# Patient Record
Sex: Male | Born: 1945 | Race: White | Hispanic: No | Marital: Married | Smoking: Former smoker
Health system: Southern US, Community
[De-identification: ages and names within clinical notes are randomized; demographics above are authoritative.]

## PROBLEM LIST (undated history)

## (undated) DIAGNOSIS — Z87442 Personal history of urinary calculi: Secondary | ICD-10-CM

## (undated) DIAGNOSIS — K219 Gastro-esophageal reflux disease without esophagitis: Secondary | ICD-10-CM

## (undated) DIAGNOSIS — Z9889 Other specified postprocedural states: Secondary | ICD-10-CM

## (undated) DIAGNOSIS — I341 Nonrheumatic mitral (valve) prolapse: Secondary | ICD-10-CM

## (undated) DIAGNOSIS — K029 Dental caries, unspecified: Secondary | ICD-10-CM

## (undated) DIAGNOSIS — D6949 Other primary thrombocytopenia: Secondary | ICD-10-CM

## (undated) DIAGNOSIS — Z794 Long term (current) use of insulin: Secondary | ICD-10-CM

## (undated) DIAGNOSIS — I340 Nonrheumatic mitral (valve) insufficiency: Secondary | ICD-10-CM

## (undated) DIAGNOSIS — E785 Hyperlipidemia, unspecified: Secondary | ICD-10-CM

## (undated) DIAGNOSIS — D61818 Other pancytopenia: Secondary | ICD-10-CM

## (undated) DIAGNOSIS — N138 Other obstructive and reflux uropathy: Secondary | ICD-10-CM

## (undated) DIAGNOSIS — E11649 Type 2 diabetes mellitus with hypoglycemia without coma: Secondary | ICD-10-CM

## (undated) DIAGNOSIS — C9201 Acute myeloblastic leukemia, in remission: Secondary | ICD-10-CM

## (undated) DIAGNOSIS — I10 Essential (primary) hypertension: Secondary | ICD-10-CM

## (undated) DIAGNOSIS — N401 Enlarged prostate with lower urinary tract symptoms: Secondary | ICD-10-CM

## (undated) DIAGNOSIS — Z972 Presence of dental prosthetic device (complete) (partial): Secondary | ICD-10-CM

## (undated) HISTORY — DX: Essential (primary) hypertension: I10

## (undated) HISTORY — DX: Acute myeloblastic leukemia, in remission: C92.01

## (undated) HISTORY — DX: Other pancytopenia: D61.818

## (undated) HISTORY — PX: ROTATOR CUFF REPAIR: SHX139

## (undated) HISTORY — PX: TENNIS ELBOW RELEASE/NIRSCHEL PROCEDURE: SHX6651

## (undated) HISTORY — PX: CARDIAC CATHETERIZATION: SHX172

## (undated) HISTORY — DX: Hyperlipidemia, unspecified: E78.5

## (undated) HISTORY — PX: INGUINAL HERNIA REPAIR: SHX194

## (undated) HISTORY — PX: EXTRACORPOREAL SHOCK WAVE LITHOTRIPSY: SHX1557

---

## 2004-12-20 ENCOUNTER — Ambulatory Visit: Payer: Self-pay | Admitting: Family Medicine

## 2005-02-14 ENCOUNTER — Ambulatory Visit: Payer: Self-pay | Admitting: Family Medicine

## 2005-02-21 ENCOUNTER — Ambulatory Visit: Payer: Self-pay | Admitting: Family Medicine

## 2005-02-28 ENCOUNTER — Ambulatory Visit: Payer: Self-pay

## 2005-02-28 ENCOUNTER — Encounter: Payer: Self-pay | Admitting: Cardiology

## 2005-03-07 ENCOUNTER — Ambulatory Visit: Payer: Self-pay | Admitting: Family Medicine

## 2005-05-09 ENCOUNTER — Ambulatory Visit: Payer: Self-pay | Admitting: Cardiology

## 2005-05-15 ENCOUNTER — Ambulatory Visit: Payer: Self-pay | Admitting: Cardiology

## 2005-05-15 ENCOUNTER — Encounter: Payer: Self-pay | Admitting: Cardiology

## 2005-05-15 ENCOUNTER — Ambulatory Visit (HOSPITAL_COMMUNITY): Admission: RE | Admit: 2005-05-15 | Discharge: 2005-05-15 | Payer: Self-pay | Admitting: Cardiology

## 2005-05-23 ENCOUNTER — Ambulatory Visit: Payer: Self-pay | Admitting: Family Medicine

## 2005-11-07 ENCOUNTER — Ambulatory Visit (HOSPITAL_COMMUNITY): Admission: RE | Admit: 2005-11-07 | Discharge: 2005-11-07 | Payer: Self-pay | Admitting: Chiropractic Medicine

## 2006-03-08 ENCOUNTER — Ambulatory Visit: Payer: Self-pay | Admitting: Family Medicine

## 2006-03-20 ENCOUNTER — Ambulatory Visit: Payer: Self-pay | Admitting: Cardiology

## 2006-03-27 ENCOUNTER — Ambulatory Visit: Payer: Self-pay

## 2006-03-27 ENCOUNTER — Encounter: Payer: Self-pay | Admitting: Cardiology

## 2006-04-12 ENCOUNTER — Ambulatory Visit: Payer: Self-pay | Admitting: Family Medicine

## 2006-04-12 ENCOUNTER — Ambulatory Visit: Payer: Self-pay | Admitting: Cardiothoracic Surgery

## 2006-04-12 LAB — CONVERTED CEMR LAB
AST: 24 units/L (ref 0–37)
Alkaline Phosphatase: 51 units/L (ref 39–117)
BUN: 15 mg/dL (ref 6–23)
Basophils Absolute: 0 10*3/uL (ref 0.0–0.1)
Calcium: 9.5 mg/dL (ref 8.4–10.5)
Chloride: 98 meq/L (ref 96–112)
Eosinophils Absolute: 0.2 10*3/uL (ref 0.0–0.6)
Glucose, Bld: 139 mg/dL — ABNORMAL HIGH (ref 70–99)
HDL: 37.5 mg/dL — ABNORMAL LOW (ref >39.0)
Hemoglobin: 16.9 g/dL (ref 13.0–17.0)
MCHC: 35.3 g/dL (ref 30.0–36.0)
MCV: 90.5 fL (ref 78.0–100.0)
Monocytes Absolute: 0.4 10*3/uL (ref 0.2–0.7)
Neutrophils Relative %: 58.8 % (ref 43.0–77.0)
PSA: 0.97 ng/mL (ref 0.10–4.00)
RDW: 13.3 % (ref 11.5–14.6)
Sodium: 139 meq/L (ref 135–145)
Total Bilirubin: 1.1 mg/dL (ref 0.3–1.2)
Total CHOL/HDL Ratio: 5.4
Total Protein: 6.9 g/dL (ref 6.0–8.3)
Triglycerides: 140 mg/dL (ref 0–149)

## 2006-04-19 ENCOUNTER — Ambulatory Visit: Payer: Self-pay | Admitting: Family Medicine

## 2006-06-19 ENCOUNTER — Ambulatory Visit: Payer: Self-pay | Admitting: Family Medicine

## 2006-07-03 ENCOUNTER — Ambulatory Visit: Payer: Self-pay | Admitting: Family Medicine

## 2006-07-03 LAB — CONVERTED CEMR LAB
Creatinine,U: 430 mg/dL
Hgb A1c MFr Bld: 6.4 % — ABNORMAL HIGH (ref 4.6–6.0)

## 2006-07-12 ENCOUNTER — Ambulatory Visit: Payer: Self-pay | Admitting: Cardiothoracic Surgery

## 2006-09-22 ENCOUNTER — Ambulatory Visit: Payer: Self-pay | Admitting: Family Medicine

## 2006-10-01 ENCOUNTER — Telehealth: Payer: Self-pay | Admitting: Internal Medicine

## 2006-12-17 ENCOUNTER — Encounter: Payer: Self-pay | Admitting: Cardiothoracic Surgery

## 2006-12-17 ENCOUNTER — Ambulatory Visit: Payer: Self-pay

## 2007-01-24 ENCOUNTER — Ambulatory Visit: Payer: Self-pay | Admitting: Cardiothoracic Surgery

## 2007-01-31 ENCOUNTER — Ambulatory Visit: Payer: Self-pay | Admitting: Cardiology

## 2007-04-04 ENCOUNTER — Telehealth: Payer: Self-pay | Admitting: Family Medicine

## 2007-05-31 ENCOUNTER — Ambulatory Visit: Payer: Self-pay | Admitting: Family Medicine

## 2007-05-31 DIAGNOSIS — Z794 Long term (current) use of insulin: Secondary | ICD-10-CM

## 2007-05-31 DIAGNOSIS — L821 Other seborrheic keratosis: Secondary | ICD-10-CM | POA: Insufficient documentation

## 2007-05-31 DIAGNOSIS — I10 Essential (primary) hypertension: Secondary | ICD-10-CM

## 2007-05-31 DIAGNOSIS — J309 Allergic rhinitis, unspecified: Secondary | ICD-10-CM | POA: Insufficient documentation

## 2007-05-31 DIAGNOSIS — E11649 Type 2 diabetes mellitus with hypoglycemia without coma: Secondary | ICD-10-CM

## 2007-05-31 DIAGNOSIS — M25519 Pain in unspecified shoulder: Secondary | ICD-10-CM | POA: Insufficient documentation

## 2007-05-31 DIAGNOSIS — E785 Hyperlipidemia, unspecified: Secondary | ICD-10-CM | POA: Insufficient documentation

## 2007-05-31 DIAGNOSIS — J45909 Unspecified asthma, uncomplicated: Secondary | ICD-10-CM | POA: Insufficient documentation

## 2007-06-24 ENCOUNTER — Ambulatory Visit: Payer: Self-pay | Admitting: Family Medicine

## 2007-06-24 LAB — CONVERTED CEMR LAB
ALT: 25 units/L (ref 0–53)
AST: 21 units/L (ref 0–37)
Albumin: 3.7 g/dL (ref 3.5–5.2)
Alkaline Phosphatase: 44 units/L (ref 39–117)
BUN: 14 mg/dL (ref 6–23)
Basophils Absolute: 0 10*3/uL (ref 0.0–0.1)
Blood in Urine, dipstick: NEGATIVE
Calcium: 9.2 mg/dL (ref 8.4–10.5)
Creatinine, Ser: 0.9 mg/dL (ref 0.4–1.5)
GFR calc non Af Amer: 91 mL/min
HCT: 46.7 % (ref 39.0–52.0)
Hgb A1c MFr Bld: 6.7 % — ABNORMAL HIGH (ref 4.6–6.0)
LDL Cholesterol: 65 mg/dL (ref 0–99)
Lymphocytes Relative: 27.5 % (ref 12.0–46.0)
MCHC: 34.3 g/dL (ref 30.0–36.0)
MCV: 92.9 fL (ref 78.0–100.0)
Microalb, Ur: 2.7 mg/dL — ABNORMAL HIGH (ref 0.0–1.9)
Monocytes Relative: 7.9 % (ref 3.0–12.0)
Neutrophils Relative %: 63.3 % (ref 43.0–77.0)
Nitrite: NEGATIVE
RBC: 5.03 M/uL (ref 4.22–5.81)
RDW: 14 % (ref 11.5–14.6)
Specific Gravity, Urine: >=1.03
Total CHOL/HDL Ratio: 3.6
Total Protein: 6.6 g/dL (ref 6.0–8.3)
Urobilinogen, UA: 1
WBC Urine, dipstick: NEGATIVE
WBC: 5.4 10*3/uL (ref 4.5–10.5)

## 2007-07-15 ENCOUNTER — Ambulatory Visit: Payer: Self-pay

## 2007-07-15 ENCOUNTER — Encounter: Payer: Self-pay | Admitting: Cardiology

## 2007-07-16 ENCOUNTER — Ambulatory Visit: Payer: Self-pay | Admitting: Family Medicine

## 2007-07-16 DIAGNOSIS — I080 Rheumatic disorders of both mitral and aortic valves: Secondary | ICD-10-CM | POA: Insufficient documentation

## 2007-07-25 ENCOUNTER — Ambulatory Visit: Payer: Self-pay | Admitting: Cardiology

## 2007-09-27 ENCOUNTER — Ambulatory Visit: Payer: Self-pay | Admitting: Internal Medicine

## 2007-10-11 ENCOUNTER — Encounter: Payer: Self-pay | Admitting: Internal Medicine

## 2007-10-11 ENCOUNTER — Ambulatory Visit: Payer: Self-pay | Admitting: Internal Medicine

## 2007-10-16 ENCOUNTER — Encounter: Payer: Self-pay | Admitting: Internal Medicine

## 2007-12-16 ENCOUNTER — Ambulatory Visit: Payer: Self-pay | Admitting: Family Medicine

## 2007-12-16 DIAGNOSIS — J13 Pneumonia due to Streptococcus pneumoniae: Secondary | ICD-10-CM | POA: Insufficient documentation

## 2007-12-17 ENCOUNTER — Telehealth: Payer: Self-pay | Admitting: Family Medicine

## 2008-01-20 ENCOUNTER — Ambulatory Visit: Payer: Self-pay | Admitting: Family Medicine

## 2008-01-20 LAB — CONVERTED CEMR LAB
BUN: 20 mg/dL (ref 6–23)
CO2: 33 meq/L — ABNORMAL HIGH (ref 19–32)
Calcium: 9.8 mg/dL (ref 8.4–10.5)
Chloride: 102 meq/L (ref 96–112)
Creatinine, Ser: 1.2 mg/dL (ref 0.4–1.5)
GFR calc non Af Amer: 65 mL/min
Glucose, Bld: 158 mg/dL — ABNORMAL HIGH (ref 70–99)
Potassium: 3.7 meq/L (ref 3.5–5.1)

## 2008-01-27 ENCOUNTER — Ambulatory Visit: Payer: Self-pay | Admitting: Cardiology

## 2008-01-27 ENCOUNTER — Ambulatory Visit: Payer: Self-pay

## 2008-01-27 ENCOUNTER — Encounter: Payer: Self-pay | Admitting: Cardiology

## 2008-01-28 ENCOUNTER — Ambulatory Visit: Payer: Self-pay | Admitting: Family Medicine

## 2008-03-16 DIAGNOSIS — S0083XA Contusion of other part of head, initial encounter: Secondary | ICD-10-CM

## 2008-03-16 DIAGNOSIS — S0003XA Contusion of scalp, initial encounter: Secondary | ICD-10-CM | POA: Insufficient documentation

## 2008-03-16 DIAGNOSIS — S1093XA Contusion of unspecified part of neck, initial encounter: Secondary | ICD-10-CM

## 2008-03-18 DIAGNOSIS — H8110 Benign paroxysmal vertigo, unspecified ear: Secondary | ICD-10-CM

## 2008-03-23 ENCOUNTER — Ambulatory Visit: Payer: Self-pay | Admitting: Family Medicine

## 2008-04-09 ENCOUNTER — Telehealth: Payer: Self-pay | Admitting: Family Medicine

## 2008-04-16 ENCOUNTER — Telehealth: Payer: Self-pay | Admitting: Internal Medicine

## 2008-07-22 ENCOUNTER — Ambulatory Visit: Payer: Self-pay | Admitting: Family Medicine

## 2008-07-22 LAB — CONVERTED CEMR LAB
ALT: 31 units/L (ref 0–53)
AST: 30 units/L (ref 0–37)
Albumin: 4 g/dL (ref 3.5–5.2)
Alkaline Phosphatase: 52 units/L (ref 39–117)
CO2: 33 meq/L — ABNORMAL HIGH (ref 19–32)
Chloride: 109 meq/L (ref 96–112)
Eosinophils Absolute: 0 10*3/uL (ref 0.0–0.7)
Eosinophils Relative: 0.6 % (ref 0.0–5.0)
Glucose, Bld: 159 mg/dL — ABNORMAL HIGH (ref 70–99)
HCT: 46 % (ref 39.0–52.0)
Hemoglobin: 16 g/dL (ref 13.0–17.0)
Lymphs Abs: 1.3 10*3/uL (ref 0.7–4.0)
MCHC: 34.7 g/dL (ref 30.0–36.0)
MCV: 91.7 fL (ref 78.0–100.0)
Microalb, Ur: 2.5 mg/dL — ABNORMAL HIGH (ref 0.0–1.9)
Monocytes Absolute: 0.3 10*3/uL (ref 0.1–1.0)
Monocytes Relative: 6.1 % (ref 3.0–12.0)
PSA: 1.04 ng/mL (ref 0.10–4.00)
RBC: 5.02 M/uL (ref 4.22–5.81)
Total Protein: 7 g/dL (ref 6.0–8.3)
Triglycerides: 96 mg/dL (ref 0.0–149.0)
VLDL: 19.2 mg/dL (ref 0.0–40.0)
WBC: 4.7 10*3/uL (ref 4.5–10.5)

## 2008-07-31 ENCOUNTER — Ambulatory Visit: Payer: Self-pay | Admitting: Family Medicine

## 2008-09-04 ENCOUNTER — Encounter: Payer: Self-pay | Admitting: *Deleted

## 2008-10-23 ENCOUNTER — Telehealth: Payer: Self-pay | Admitting: Family Medicine

## 2008-10-27 ENCOUNTER — Ambulatory Visit: Payer: Self-pay | Admitting: Family Medicine

## 2008-10-27 LAB — CONVERTED CEMR LAB
CO2: 31 meq/L (ref 19–32)
Calcium: 9.2 mg/dL (ref 8.4–10.5)
Hgb A1c MFr Bld: 6.7 % — ABNORMAL HIGH (ref 4.6–6.5)
Potassium: 3.1 meq/L — ABNORMAL LOW (ref 3.5–5.1)

## 2008-11-02 ENCOUNTER — Encounter (INDEPENDENT_AMBULATORY_CARE_PROVIDER_SITE_OTHER): Payer: Self-pay | Admitting: *Deleted

## 2008-11-02 ENCOUNTER — Ambulatory Visit: Payer: Self-pay | Admitting: Family Medicine

## 2008-11-05 ENCOUNTER — Telehealth: Payer: Self-pay | Admitting: Family Medicine

## 2008-11-29 ENCOUNTER — Encounter: Admission: RE | Admit: 2008-11-29 | Discharge: 2008-11-29 | Payer: Self-pay | Admitting: Orthopaedic Surgery

## 2008-12-15 ENCOUNTER — Encounter: Admission: RE | Admit: 2008-12-15 | Discharge: 2009-02-11 | Payer: Self-pay | Admitting: Orthopaedic Surgery

## 2009-01-13 ENCOUNTER — Ambulatory Visit (HOSPITAL_COMMUNITY): Admission: RE | Admit: 2009-01-13 | Discharge: 2009-01-13 | Payer: Self-pay | Admitting: Cardiology

## 2009-01-13 ENCOUNTER — Ambulatory Visit: Payer: Self-pay | Admitting: Cardiology

## 2009-01-13 ENCOUNTER — Ambulatory Visit: Payer: Self-pay

## 2009-01-13 ENCOUNTER — Encounter: Payer: Self-pay | Admitting: Cardiology

## 2009-01-19 ENCOUNTER — Ambulatory Visit: Payer: Self-pay | Admitting: Cardiology

## 2009-01-19 DIAGNOSIS — E663 Overweight: Secondary | ICD-10-CM

## 2009-01-21 ENCOUNTER — Ambulatory Visit: Payer: Self-pay | Admitting: Family Medicine

## 2009-01-21 LAB — CONVERTED CEMR LAB
BUN: 19 mg/dL (ref 6–23)
CO2: 30 meq/L (ref 19–32)
Chloride: 101 meq/L (ref 96–112)
Sodium: 143 meq/L (ref 135–145)

## 2009-01-29 ENCOUNTER — Ambulatory Visit: Payer: Self-pay | Admitting: Family Medicine

## 2009-02-11 ENCOUNTER — Encounter: Admission: RE | Admit: 2009-02-11 | Discharge: 2009-02-25 | Payer: Self-pay | Admitting: Orthopaedic Surgery

## 2009-02-18 ENCOUNTER — Encounter: Admission: RE | Admit: 2009-02-18 | Discharge: 2009-05-12 | Payer: Self-pay | Admitting: Orthopaedic Surgery

## 2009-05-25 ENCOUNTER — Telehealth: Payer: Self-pay | Admitting: Family Medicine

## 2009-05-25 ENCOUNTER — Telehealth: Payer: Self-pay | Admitting: Cardiology

## 2009-07-01 ENCOUNTER — Encounter: Admission: RE | Admit: 2009-07-01 | Discharge: 2009-07-01 | Payer: Self-pay | Admitting: Orthopaedic Surgery

## 2009-07-02 ENCOUNTER — Telehealth: Payer: Self-pay | Admitting: Family Medicine

## 2009-07-14 ENCOUNTER — Telehealth: Payer: Self-pay | Admitting: Family Medicine

## 2009-07-19 ENCOUNTER — Encounter: Admission: RE | Admit: 2009-07-19 | Discharge: 2009-10-17 | Payer: Self-pay | Admitting: Orthopaedic Surgery

## 2009-07-27 ENCOUNTER — Ambulatory Visit: Payer: Self-pay | Admitting: Family Medicine

## 2009-07-27 LAB — CONVERTED CEMR LAB
AST: 24 units/L (ref 0–37)
Albumin: 4 g/dL (ref 3.5–5.2)
BUN: 18 mg/dL (ref 6–23)
Bilirubin, Direct: 0.2 mg/dL (ref 0.0–0.3)
Blood in Urine, dipstick: NEGATIVE
Chloride: 101 meq/L (ref 96–112)
Eosinophils Absolute: 0 10*3/uL (ref 0.0–0.7)
GFR calc non Af Amer: 75.96 mL/min (ref >60)
Glucose, Bld: 143 mg/dL — ABNORMAL HIGH (ref 70–99)
HDL: 41.3 mg/dL (ref >39.00)
Hemoglobin: 15.7 g/dL (ref 13.0–17.0)
Hgb A1c MFr Bld: 6.9 % — ABNORMAL HIGH (ref 4.6–6.5)
LDL Cholesterol: 72 mg/dL (ref 0–99)
Lymphs Abs: 1.5 10*3/uL (ref 0.7–4.0)
Microalb Creat Ratio: 0.8 mg/g (ref 0.0–30.0)
Microalb, Ur: 3.5 mg/dL — ABNORMAL HIGH (ref 0.0–1.9)
Monocytes Absolute: 0.4 10*3/uL (ref 0.1–1.0)
Nitrite: NEGATIVE
Platelets: 230 10*3/uL (ref 150.0–400.0)
Potassium: 3.6 meq/L (ref 3.5–5.1)
Total Bilirubin: 0.8 mg/dL (ref 0.3–1.2)
Total CHOL/HDL Ratio: 3
Urobilinogen, UA: 1
WBC: 5.7 10*3/uL (ref 4.5–10.5)

## 2009-08-03 ENCOUNTER — Ambulatory Visit: Payer: Self-pay | Admitting: Family Medicine

## 2009-10-20 ENCOUNTER — Encounter: Admission: RE | Admit: 2009-10-20 | Discharge: 2009-11-05 | Payer: Self-pay | Admitting: Orthopaedic Surgery

## 2009-11-25 ENCOUNTER — Telehealth: Payer: Self-pay | Admitting: Cardiology

## 2010-01-20 ENCOUNTER — Encounter: Payer: Self-pay | Admitting: Cardiology

## 2010-01-20 ENCOUNTER — Ambulatory Visit: Payer: Self-pay

## 2010-01-20 ENCOUNTER — Ambulatory Visit: Payer: Self-pay | Admitting: Cardiology

## 2010-01-20 ENCOUNTER — Ambulatory Visit (HOSPITAL_COMMUNITY)
Admission: RE | Admit: 2010-01-20 | Discharge: 2010-01-20 | Payer: Self-pay | Source: Home / Self Care | Attending: Cardiology | Admitting: Cardiology

## 2010-01-24 ENCOUNTER — Ambulatory Visit: Payer: Self-pay | Admitting: Family Medicine

## 2010-01-24 LAB — CONVERTED CEMR LAB
BUN: 20 mg/dL (ref 6–23)
CO2: 30 meq/L (ref 19–32)
Calcium: 9.6 mg/dL (ref 8.4–10.5)
GFR calc non Af Amer: 71.09 mL/min (ref >60.00)
Hgb A1c MFr Bld: 7 % — ABNORMAL HIGH (ref 4.6–6.5)
Sodium: 141 meq/L (ref 135–145)

## 2010-02-01 ENCOUNTER — Ambulatory Visit: Payer: Self-pay | Admitting: Family Medicine

## 2010-03-15 ENCOUNTER — Ambulatory Visit
Admission: RE | Admit: 2010-03-15 | Discharge: 2010-03-15 | Payer: Self-pay | Source: Home / Self Care | Attending: Family Medicine | Admitting: Family Medicine

## 2010-03-17 NOTE — Progress Notes (Signed)
Summary: letter of approval needs by 05/26/09  Tomorrow!!!! at 12:30  Phone Note Call from Patient Call back at [phone]   Caller: Patient Reason for Call: Talk to Nurse Action Taken: Rx Called In Summary of Call: needs letter of approval to complete in  [employer] for Metformin, needs letter before Thursday Initial call taken by: Migdalia Dk,  May 25, 2009 4:43 PM  Follow-up for Phone Call        Drug study - needs a letter saying that he has a heart murmur but it's ok for him to be in the study.  Pt states he needs the letter 05/26/2009 by 12:30 if possible.  Will forward to Dr Antoine Poche for his review. Follow-up by: Charolotte Capuchin, RN,  May 25, 2009 5:25 PM  Additional Follow-up for Phone Call Additional follow up Details #1::        Letter dictated Additional Follow-up by: Rollene Rotunda, MD, [facility],  May 25, 2009 6:08 PM    Additional Follow-up for Phone Call Additional follow up Details #2::    Spoke with Mr. Eddins and he will pick up letter today between 12:30 and 1 pm Follow-up by: Lisabeth Devoid RN,  May 26, 2009 9:29 AM

## 2010-03-17 NOTE — Miscellaneous (Signed)
  Clinical Lists Changes  Orders: Added new Referral order of Echocardiogram (Echo) - Signed 

## 2010-03-17 NOTE — Progress Notes (Signed)
Summary: REQ FOR REFILL (Zocor / Simvastatin)  Phone Note Refill Request   Refills Requested: Medication #1:  ZOCOR 20 MG  TABS Take 1 tab by mouth at bedtime; cpx when due or ov for med RF   Notes: [facility] [address].... 90-day supply.... Pt adv he has appt next month for cpx w/ Dr Tawanna Cooler.  sent already  Initial call taken by: Debbra Riding,  Jul 02, 2009 9:39 AM

## 2010-03-17 NOTE — Progress Notes (Signed)
Summary: need info  Phone Note Call from Patient Call back at [phone]   Caller: Patient---voicemail Summary of Call: What is his HBGA1c level ? Initial call taken by: Warnell Forester,  May 25, 2009 11:08 AM  Follow-up for Phone Call        Phone Call Completed Follow-up by: Kern Reap CMA Duncan Dull),  May 25, 2009 11:59 AM

## 2010-03-17 NOTE — Progress Notes (Signed)
Summary: lab concerns  Phone Note Call from Patient   Caller: Patient Call For: Roderick Pee MD Summary of Call: patient is calling because he had sugery last week.  he was given cortizone and oxycodone.  his labs are 07/27/09 for his physical.  he would like to know if he should still come in for his fasting lab.   Also FYI he stopped taking his glipezide. Initial call taken by: Kern Reap CMA Duncan Dull),  July 14, 2009 12:59 PM  Follow-up for Phone Call        yes Follow-up by: Roderick Pee MD,  July 15, 2009 8:59 AM  Additional Follow-up for Phone Call Additional follow up Details #1::        left message on machine for patient  Additional Follow-up by: Kern Reap CMA Duncan Dull),  July 15, 2009 11:51 AM

## 2010-03-17 NOTE — Assessment & Plan Note (Signed)
Summary: 6 month follow up/cjr   Vital Signs:  Patient profile:   65 year old male Weight:      204 pounds Temp:     98.7 degrees F oral BP sitting:   130 / 80  (left arm) Cuff size:   regular  Vitals Entered By: Kern Reap CMA Duncan Dull) (February 01, 2010 2:22 PM) CC: follow-up visit   Primary Care Ekin Pilar:  Dr.Todd  CC:  follow-up visit.  History of Present Illness: David Carr is a 65 year old male, who comes in today for follow-up of diabetes.  His fasting blood sugar is now up to 170 with a hemoglobin A1c of 7.0%.  He cannot tolerate anything more than 500 mg of metformin b.i.d. it gives him severe abdominal pain and diarrhea.  He also is intolerant of glipizide.  We talked about all the options I gave him 10 units of 7525.  Next subcutaneously as his first insulin injection  Allergies: 1)  ! Morphine/d5w  Past History:  Past medical, surgical, family and social histories (including risk factors) reviewed for relevance to current acute and chronic problems.  Past Medical History: Reviewed history from 01/20/2010 and no changes required. Allergic rhinitis Asthma Diabetes mellitus, type II Hyperlipidemia Hypertension Urinary tract outlet obstruction following surgery Mitral insufficiency  Past Surgical History: Reviewed history from 01/20/2010 and no changes required. Lft inguinal hernia repair Elbow surgery Rotator cuff surgery  Family History: Reviewed history from 07/16/2007 and no changes required. father died in his 46s and MI.  He had underlying alcoholism, and obesity mother lived to be late 62s died of old age  no brothersone sister has asthma  Social History: Reviewed history from 07/16/2007 and no changes required. Occupation: [employer] Married Never Smoked Alcohol use-no Drug use-no Regular exercise-yes  Review of Systems      See HPI       Flu Vaccine Consent Questions     Do you have a history of severe allergic reactions to this  vaccine? no    Any prior history of allergic reactions to egg and/or gelatin? no    Do you have a sensitivity to the preservative Thimersol? no    Do you have a past history of Guillan-Barre Syndrome? no    Do you currently have an acute febrile illness? no    Have you ever had a severe reaction to latex? no    Vaccine information given and explained to patient? yes    Are you currently pregnant? no    Lot Number:[id number]   Exp Date:08/13/2010   Site Given  Left Deltoid IM   Physical Exam  General:  Well-developed,well-nourished,in no acute distress; alert,appropriate and cooperative throughout examination   Problems:  Medical Problems Added: 1)  Dx of Diabetes Mellitus Type 1  (ICD-250.01)  Impression & Recommendations:  Problem # 1:  DIABETES MELLITUS TYPE 1 (ICD-250.01) Assessment New  His updated medication list for this problem includes:    Bayer Aspirin 325 Mg Tabs (Aspirin) ..... Once daily    Metformin Hcl 500 Mg Tabs (Metformin hcl) .Marland Kitchen... Take 1 tablet by mouth two times a day    Humalog Mix 75/25 75-25 % Susp (Insulin lispro prot & lispro) ..... Use 10 units at bedtime  Complete Medication List: 1)  Tenoretic 50 50-25 Mg Tabs (Atenolol-chlorthalidone) .... Take 1 tablet by mouth once a day ; cpx when due or ov for med rf 2)  Zocor 20 Mg Tabs (Simvastatin) .... Take 1 tab by mouth at bedtime;  cpx when due or ov for med rf 3)  Bayer Aspirin 325 Mg Tabs (Aspirin) .... Once daily 4)  Metformin Hcl 500 Mg Tabs (Metformin hcl) .... Take 1 tablet by mouth two times a day 5)  Humalog Mix 75/25 75-25 % Susp (Insulin lispro prot & lispro) .... Use 10 units at bedtime 6)  Bd Insulin Syringe Ultrafine 29g X 1/2" 0.5 Ml Misc (Insulin syringe-needle u-100) .... Use daily to inject insulin 7)  Freestyle Lite Test Strp (Glucose blood) .... Use once daily for glucose control 8)  Freestyle Unistick Ii Lancets Misc (Lancets) .... Use once daily for glucose control  Other  Orders: Admin 1st Vaccine (40981) Flu Vaccine 38yrs + (19147)  Patient Instructions: 1)  take 10 units of 75, 25 mix insulin nightly at bedtime starting tomorrow night. 2)  Check a fasting blood sugar daily in the morning. 3)  Return in 6 weeks for follow-up with a notebook of all your blood sugar readings. 4)  Continue the metformin 500 mg b.i.d. 5)  If however at you have episodes where you feel your blood sugar might be low.  Check a blood sugar, if it's below 68......... have a snack, immediately Prescriptions: FREESTYLE UNISTICK II LANCETS  MISC (LANCETS) use once daily for glucose control  #100 x 3   Entered by:   Kern Reap CMA (AAMA)   Authorized by:   Roderick Pee MD   Signed by:   Kern Reap CMA (AAMA) on 02/01/2010   Method used:   Electronically to        [facility] (retail)       [address]       [place], [place]  [address]       Ph: [phone]       Fax: [phone]   RxID:   [id number] FREESTYLE LITE TEST  STRP (GLUCOSE BLOOD) use once daily for glucose control  #100 x 3   Entered by:   Kern Reap CMA (AAMA)   Authorized by:   Roderick Pee MD   Signed by:   Kern Reap CMA (AAMA) on 02/01/2010   Method used:   Electronically to        [id number] (retail)       [address]       [place], [place]  [address]       Ph: [phone]       Fax: [phone]   RxID:   [id number] BD INSULIN SYRINGE ULTRAFINE 29G X 1/2" 0.5 ML MISC (INSULIN SYRINGE-NEEDLE U-100) use daily to inject insulin  #100 x 3   Entered by:   Kern Reap CMA (AAMA)   Authorized by:   Roderick Pee MD   Signed by:   Kern Reap CMA (AAMA) on 02/01/2010   Method used:   Electronically to        [id number] (retail)       [address]       [place], [place]  [address]       Ph: [phone]       Fax: [phone]   RxID:   [id number] HUMALOG MIX 75/25 75-25 % SUSP (INSULIN LISPRO  PROT & LISPRO) use 10 units at bedtime  #6 x 3   Entered by:   Kern Reap CMA (AAMA)   Authorized by:   Roderick Pee MD   Signed by:   Kern Reap CMA (AAMA) on  02/01/2010   Method used:   Electronically to        [facility] [address] (retail)       [address]       [place], [place]  [address]       Ph: [phone]       Fax: [phone]   RxID:   [id number]    Orders Added: 1)  Admin 1st Vaccine [90471] 2)  Flu Vaccine 48yrs + [66063] 3)  Est. Patient Level IV [01601]

## 2010-03-17 NOTE — Assessment & Plan Note (Signed)
Summary: cpx/njr/Pt coming in at 3:15 per Dr. Beatrix Fetters   Vital Signs:  Patient profile:   65 year old male Height:      68 inches Weight:      200 pounds Temp:     98.3 degrees F oral BP sitting:   130 / 80  (left arm) Cuff size:   regular  Vitals Entered By: Kern Reap CMA Duncan Dull) (August 03, 2009 2:59 PM) CC: cpx Is Patient Diabetic? Yes Pain Assessment Patient in pain? no        Primary Care Provider:  Dr.Todd  CC:  cpx.  History of Present Illness: David Carr is a 65 year old, married man, nonsmoker, who comes in today for evaluation of hypertension, hyperlipidemia, and diabetes.  For hypertension.  He takes Tenoretic 50 -- 25 daily.  BP 130/80.  For hyperlipidemia.  He takes Zocor 20 mg nightly lipids are grown.  Her diabetes.  She takes metformin 500 mg b.i.d., and one aspirin tablet.  Fasting blood sugar 143A1c 6.9%.  His last eye exam two months ago showed no retinopathy.  He has fair dental care, colonoscopy, normal, tetanus, 2000, seasonal flu 2010  He recently had surgery on his right shoulder from Dr. Cleophas Dunker for torn rotator cuff.  Three weeks ago.  Allergies: 1)  ! Morphine/d5w  Past History:  Past medical, surgical, family and social histories (including risk factors) reviewed, and no changes noted (except as noted below).  Past Medical History: Reviewed history from 01/19/2009 and no changes required. Allergic rhinitis Asthma Diabetes mellitus, type II Hyperlipidemia Hypertension Urinary tract outlet obstruction following surgery Mitral insufficiencyleft hernia Right elbow surgery  Family History: Reviewed history from 07/16/2007 and no changes required. father died in his 63s and MI.  He had underlying alcoholism, and obesity mother lived to be late 70s died of old age  no brothersone sister has asthma  Social History: Reviewed history from 07/16/2007 and no changes required. Occupation: [employer] Mar[employer] Never Smoked Alcohol  use-no Drug use-no Regular exercise-yes  Review of Systems      See HPI  Physical Exam  General:  Well-developed,well-nourished,in no acute distress; alert,appropriate and cooperative throughout examination Head:  Normocephalic and atraumatic without obvious abnormalities. No apparent alopecia or balding. Eyes:  No corneal or conjunctival inflammation noted. EOMI. Perrla. Funduscopic exam benign, without hemorrhages, exudates or papilledema. Vision grossly normal. Ears:  External ear exam shows no significant lesions or deformities.  Otoscopic examination reveals clear canals, tympanic membranes are intact bilaterally without bulging, retraction, inflammation or discharge. Hearing is grossly normal bilaterally. Nose:  External nasal examination shows no deformity or inflammation. Nasal mucosa are pink and moist without lesions or exudates. Mouth:  Oral mucosa and oropharynx without lesions or exudates.  Teeth in good repair. Neck:  No deformities, masses, or tenderness noted. Chest Wall:  No deformities, masses, tenderness or gynecomastia noted. Breasts:  No masses or gynecomastia noted Lungs:  Normal respiratory effort, chest expands symmetrically. Lungs are clear to auscultation, no crackles or wheezes. Heart:  PMI not palpable.  S1, S2 was normal limits, cyst.  This is electrically split.  There is a grade 2/6 systolic ejection murmur consistent with some mitral regurgitation Abdomen:  Bowel sounds positive,abdomen soft and non-tender without masses, organomegaly or hernias noted. Rectal:  No external abnormalities noted. Normal sphincter tone. No rectal masses or tenderness. Genitalia:  Testes bilaterally descended without nodularity, tenderness or masses. No scrotal masses or lesions. No penis lesions or urethral discharge. Prostate:  Prostate gland firm and smooth,  no enlargement, nodularity, tenderness, mass, asymmetry or induration. Msk:  No deformity or scoliosis noted of thoracic or  lumbar spine.   Pulses:  R and L carotid,radial,femoral,dorsalis pedis and posterior tibial pulses are full and equal bilaterally Extremities:  No clubbing, cyanosis, edema, or deformity noted with normal full range of motion of all joints.   Neurologic:  No cranial nerve deficits noted. Station and gait are normal. Plantar reflexes are down-going bilaterally. DTRs are symmetrical throughout. Sensory, motor and coordinative functions appear intact. Skin:  Intact without suspicious lesions or rashes Cervical Nodes:  No lymphadenopathy noted Axillary Nodes:  No palpable lymphadenopathy Inguinal Nodes:  No significant adenopathy Psych:  Cognition and judgment appear intact. Alert and cooperative with normal attention span and concentration. No apparent delusions, illusions, hallucinations  Diabetes Management Exam:    Foot Exam (with socks and/or shoes not present):       Sensory-Pinprick/Light touch:          Left medial foot (L-4): normal          Left dorsal foot (L-5): normal          Left lateral foot (S-1): normal          Right medial foot (L-4): normal          Right dorsal foot (L-5): normal          Right lateral foot (S-1): normal       Sensory-Monofilament:          Left foot: normal          Right foot: normal       Inspection:          Left foot: normal          Right foot: normal       Nails:          Left foot: normal          Right foot: normal    Eye Exam:       Eye Exam done elsewhere          Date: 06/02/2009          Results: normal          Done by: opth   Impression & Recommendations:  Problem # 1:  DIABETES MELLITUS, TYPE II (ICD-250.00) Assessment Improved  The following medications were removed from the medication list:    Metformin Hcl 1000 Mg Tabs (Metformin hcl) .Marland Kitchen... Take 1 tablet by mouth every morning    Glucotrol 5 Mg Tabs (Glipizide) .Marland Kitchen... Take 1 tablet by mouth every morning His updated medication list for this problem includes:    Bayer  Aspirin 325 Mg Tabs (Aspirin) ..... Once daily    Metformin Hcl 500 Mg Tabs (Metformin hcl) .Marland Kitchen... Take 1 tablet by mouth two times a day  Problem # 2:  MITRAL REGURGITATION (ICD-396.3) Assessment: Unchanged  His updated medication list for this problem includes:    Tenoretic 50 50-25 Mg Tabs (Atenolol-chlorthalidone) .Marland Kitchen... Take 1 tablet by mouth once a day ; cpx when due or ov for med rf    Bayer Aspirin 325 Mg Tabs (Aspirin) ..... Once daily  Problem # 3:  HYPERTENSION (ICD-401.9) Assessment: Improved  His updated medication list for this problem includes:    Tenoretic 50 50-25 Mg Tabs (Atenolol-chlorthalidone) .Marland Kitchen... Take 1 tablet by mouth once a day ; cpx when due or ov for med rf  Orders: EKG w/ Interpretation (93000)  Problem # 4:  OVERWEIGHT (  ICD-278.02) Assessment: Improved  Complete Medication List: 1)  Tenoretic 50 50-25 Mg Tabs (Atenolol-chlorthalidone) .... Take 1 tablet by mouth once a day ; cpx when due or ov for med rf 2)  Zocor 20 Mg Tabs (Simvastatin) .... Take 1 tab by mouth at bedtime; cpx when due or ov for med rf 3)  Bayer Aspirin 325 Mg Tabs (Aspirin) .... Once daily 4)  Metformin Hcl 500 Mg Tabs (Metformin hcl) .... Take 1 tablet by mouth two times a day  Other Orders: Prescription Created Electronically [id number]) Tdap => 64yrs IM (09811) Admin 1st Vaccine (91478)  Patient Instructions: 1)  continue current treatment program.  Remember to walk 30 minutes daily. 2)  Please schedule a follow-up appointment in 6 months. 3)  BMP prior to visit, ICD-9: 4)  HbgA1C prior to visit, ICD-9: Prescriptions: METFORMIN HCL 500 MG TABS (METFORMIN HCL) Take 1 tablet by mouth two times a day  #200 x 3   Entered and Authorized by:   Roderick Pee MD   Signed by:   Roderick Pee MD on 08/03/2009   Method used:   Electronically to        [facility] (retail)       [address]       [place], [place]  [address]       Ph: [phone]       Fax:  [phone]   RxID:   [id number] ZOCOR 20 MG  TABS (SIMVASTATIN) Take 1 tab by mouth at bedtime; cpx when due or ov for med RF  #100 x 3   Entered and Authorized by:   Roderick Pee MD   Signed by:   Roderick Pee MD on 08/03/2009   Method used:   Electronically to        [facility] (retail)       [address]       [place], [place]  [address]       Ph: [phone]       Fax: [phone]   RxID:   [id number] TENORETIC 50 50-25 MG  TABS (ATENOLOL-CHLORTHALIDONE) Take 1 tablet by mouth once a day ; cpx when due or ov for med RF  #100 x 3   Entered and Authorized by:   Roderick Pee MD   Signed by:   Roderick Pee MD on 08/03/2009   Method used:   Electronically to        [facility] (retail)       [address]       [place], [place]  [address]       Ph: [phone]       Fax: [phone]   RxID:   [id number]    Immunizations Administered:  Tetanus Vaccine:    Vaccine Type: Tdap    Site: left deltoid    Mfr: GlaxoSmithKline    Dose: 0.5 ml    Route: IM    Given by: Kern Reap CMA (AAMA)    Exp. Date: 05/07/2011    Lot #: [id number]    Physician counseled: yes

## 2010-03-17 NOTE — Progress Notes (Signed)
Summary: re having an echo  Phone Note Call from Patient   Caller: Patient Reason for Call: Talk to Nurse Summary of Call: pt called to set up 1 year follow up, states he should also have an echo, does he need one? [phone] if so, i will be glad to set up Initial call taken by: Glynda Jaeger,  November 25, 2009 10:43 AM  Follow-up for Phone Call        pt rec notice for yearly check and 2d echo. geisla to schedule.  Follow-up by: Claris Gladden RN,  November 25, 2009 11:47 AM

## 2010-03-17 NOTE — Assessment & Plan Note (Signed)
Summary: 424.0  MR  pfh,rn   Visit Type:  Follow-up Primary Provider:  Dr.Todd  CC:  Mitral Regurgitation.  History of Present Illness: Patient presents for followup. Since I last saw him he has done well. He remains active. He denies any shortness of breath, PND or orthopnea. He has had no palpitations, presyncope or syncope. He has had no chest discomfort, neck or arm discomfort. He has had no weight gain or edema.  Current Medications (verified): 1)  Tenoretic 50 50-25 Mg  Tabs (Atenolol-Chlorthalidone) .... Take 1 Tablet By Mouth Once A Day ; Cpx When Due or Ov For Med Rf 2)  Zocor 20 Mg  Tabs (Simvastatin) .... Take 1 Tab By Mouth At Bedtime; Cpx When Due or Ov For Med Rf 3)  Bayer Aspirin 325 Mg  Tabs (Aspirin) .... Once Daily 4)  Metformin Hcl 500 Mg Tabs (Metformin Hcl) .... Take 1 Tablet By Mouth Two Times A Day  Allergies (verified): 1)  ! Morphine/d5w  Past History:  Past Medical History: Allergic rhinitis Asthma Diabetes mellitus, type II Hyperlipidemia Hypertension Urinary tract outlet obstruction following surgery Mitral insufficiency  Past Surgical History: Lft inguinal hernia repair Elbow surgery Rotator cuff surgery  Review of Systems       As stated in the HPI and negative for all other systems.   Vital Signs:  Patient profile:   65 year old male Height:      68 inches Weight:      201 pounds BMI:     30.67 Pulse rate:   77 / minute Resp:     16 per minute BP sitting:   110 / 82  (right arm)  Vitals Entered By: Marrion Coy, CNA (January 20, 2010 3:15 PM)  Physical Exam  General:  Well developed, well nourished, in no acute distress. Head:  normocephalic and atraumatic Neck:  Neck supple, no JVD. No masses, thyromegaly or abnormal cervical nodes. Chest Wall:  no deformities or breast masses noted Lungs:  Clear bilaterally to auscultation and percussion. Abdomen:  Bowel sounds positive,abdomen soft and non-tender without masses,  organomegaly or hernias noted. Msk:  No deformity or scoliosis noted of thoracic or lumbar spine.   Extremities:  No clubbing or cyanosis. Neurologic:  Alert and oriented x 3. Skin:  Intact without suspicious lesions or rashes Cervical Nodes:  No lymphadenopathy noted Inguinal Nodes:  No significant adenopathy Psych:  Normal affect.   Detailed Cardiovascular Exam  Neck    Carotids: Carotids full and equal bilaterally without bruits.      Neck Veins: Normal, no JVD.    Heart    Inspection: no deformities or lifts noted.      Palpation: normal PMI with no thrills palpable.      Auscultation: S1 and S2 within normal limits, no S3, no S4, no clicks, no rubs, 3/6 apical holosystolic murmur radiating to the axilla, no diastolic murmurs.  Vascular    Abdominal Aorta: no palpable masses, pulsations, or audible bruits.      Femoral Pulses: normal femoral pulses bilaterally.      Pedal Pulses: R and L carotid,radial,femoral,dorsalis pedis and posterior tibial pulses are full and equal bilaterally    Radial Pulses: normal radial pulses bilaterally.      Peripheral Circulation: no clubbing, cyanosis, or edema noted with normal capillary refill.     EKG  Procedure date:  01/20/2010  Findings:      Sinus rhythm, rate 76, axis within normal limits, intervals within normal  limits, no acute ST-T wave changes  Impression & Recommendations:  Problem # 1:  MITRAL REGURGITATION (ICD-396.3) Is mitral regurgitation remains mild by echo today. I reviewed this with him. He understands endocarditis prophylaxis. No change in therapy is indicated.  Problem # 2:  HYPERTENSION (ICD-401.9) His blood pressure is controlled. He will continue the meds as listed.  Problem # 3:  HYPERLIPIDEMIA (ICD-272.4) I reviewed his most recent lipids with an LDL of 72 and HDL 41. He will continue with therapy as listed.  Patient Instructions: 1)  Your physician recommends that you schedule a follow-up appointment  in: YEAR WITH DR [facility] 2)  Your physician recommends that you continue on your current medications as directed. Please refer to the Current Medication list given to you today. 3)  Your physician has requested that you have an echocardiogram.  Echocardiography is a painless test that uses sound waves to create images of your heart. It provides your doctor with information about the size and shape of your heart and how well your heart's chambers and valves are working.  This procedure takes approximately one hour. There are no restrictions for this procedure.ECHO 1 YEAR

## 2010-03-23 NOTE — Assessment & Plan Note (Signed)
Summary: 6 wk rov/njr   Vital Signs:  Patient profile:   65 year old male Weight:      206 pounds Temp:     98.9 degrees F oral BP sitting:   140 / 70  (left arm) Cuff size:   regular  Vitals Entered By: Kern Reap CMA Duncan Dull) (March 15, 2010 1:54 PM) CC: follow-up visit   Primary Care Provider:  Dr.Todd  CC:  follow-up visit.  History of Present Illness: David Carr is a 65 year old male type I diabetic who comes in today for follow-up.  Six weeks ago.  We start him on insulin 10 units at nightly because his blood sugar was uncontrolled with oral medications.  He comes in today.  Blood sugars were in the 110, 120 range.  No hypoglycemia  He is also a candidate for some medical research.  Also, we will start an ACE inhibitor  Allergies: 1)  ! Morphine/d5w  Past History:  Past medical, surgical, family and social histories (including risk factors) reviewed for relevance to current acute and chronic problems.  Past Medical History: Reviewed history from 01/20/2010 and no changes required. Allergic rhinitis Asthma Diabetes mellitus, type II Hyperlipidemia Hypertension Urinary tract outlet obstruction following surgery Mitral insufficiency  Past Surgical History: Reviewed history from 01/20/2010 and no changes required. Lft inguinal hernia repair Elbow surgery Rotator cuff surgery  Family History: Reviewed history from 07/16/2007 and no changes required. father died in his 80s and MI.  He had underlying alcoholism, and obesity mother lived to be late 28s died of old age  no brothersone sister has asthma  Social History: Reviewed history from 07/16/2007 and no changes required. Occupation: [employer] Married Never Smoked Alcohol use-no Drug use-no Regular exercise-yes  Review of Systems      See HPI  Physical Exam  General:  Well-developed,well-nourished,in no acute distress; alert,appropriate and cooperative throughout examination   Impression  & Recommendations:  Problem # 1:  DIABETES MELLITUS TYPE 1 (ICD-250.01) Assessment Improved  His updated medication list for this problem includes:    Bayer Aspirin 325 Mg Tabs (Aspirin) ..... Once daily    Metformin Hcl 500 Mg Tabs (Metformin hcl) .Marland Kitchen... Take 1 tablet by mouth two times a day    Humalog Mix 75/25 75-25 % Susp (Insulin lispro prot & lispro) ..... Use 10 units at bedtime    Lisinopril 10 Mg Tabs (Lisinopril) .Marland Kitchen... Take 1 tablet by mouth every morning  Complete Medication List: 1)  Tenoretic 50 50-25 Mg Tabs (Atenolol-chlorthalidone) .... Take 1 tablet by mouth once a day ; cpx when due or ov for med rf 2)  Zocor 20 Mg Tabs (Simvastatin) .... Take 1 tab by mouth at bedtime; cpx when due or ov for med rf 3)  Bayer Aspirin 325 Mg Tabs (Aspirin) .... Once daily 4)  Metformin Hcl 500 Mg Tabs (Metformin hcl) .... Take 1 tablet by mouth two times a day 5)  Humalog Mix 75/25 75-25 % Susp (Insulin lispro prot & lispro) .... Use 10 units at bedtime 6)  Bd Insulin Syringe Ultrafine 29g X 1/2" 0.5 Ml Misc (Insulin syringe-needle u-100) .... Use daily to inject insulin 7)  Freestyle Lite Test Strp (Glucose blood) .... Use once daily for glucose control 8)  Freestyle Unistick Ii Lancets Misc (Lancets) .... Use once daily for glucose control 9)  Lisinopril 10 Mg Tabs (Lisinopril) .... Take 1 tablet by mouth every morning  Patient Instructions: 1)  add lisinopril 10 mg daily......... the potential side effects are  cough and hives. 2)  Check y  blood pressure and blood sugar 3 times a week. 3)  If your blood pressure drops too low or you feel lightheaded when you stand up.  Then cut the Tenoretic in  half. 4)  Please schedule a follow-up appointment in 2 months.Marland Kitchen..250.01 5)  BMP prior to visit, ICD-9: 6)  HbgA1C prior to visit, ICD-9: Prescriptions: LISINOPRIL 10 MG TABS (LISINOPRIL) Take 1 tablet by mouth every morning  #100 x 3   Entered and Authorized by:   Roderick Pee MD   Signed  by:   Roderick Pee MD on 03/15/2010   Method used:   Electronically to        [facility]  [phone]* (retail)       [address]       [place], [place]  [address]       Ph: [phone] or [phone]       Fax: [phone]   RxID:   [id number]    Orders Added: 1)  Est. Patient Level III [36644]

## 2010-04-12 ENCOUNTER — Ambulatory Visit (INDEPENDENT_AMBULATORY_CARE_PROVIDER_SITE_OTHER): Payer: Managed Care, Other (non HMO) | Admitting: Family Medicine

## 2010-04-12 ENCOUNTER — Encounter: Payer: Self-pay | Admitting: Family Medicine

## 2010-04-12 VITALS — BP 110/70 | HR 104 | Temp 98.3°F | Ht 68.5 in | Wt 206.5 lb

## 2010-04-12 DIAGNOSIS — I498 Other specified cardiac arrhythmias: Secondary | ICD-10-CM

## 2010-04-12 DIAGNOSIS — I10 Essential (primary) hypertension: Secondary | ICD-10-CM

## 2010-04-12 DIAGNOSIS — E119 Type 2 diabetes mellitus without complications: Secondary | ICD-10-CM

## 2010-04-12 DIAGNOSIS — R000 Tachycardia, unspecified: Secondary | ICD-10-CM

## 2010-04-12 LAB — CBC WITH DIFFERENTIAL/PLATELET
Eosinophils Relative: 0.3 % (ref 0.0–5.0)
HCT: 46.5 % (ref 39.0–52.0)
Lymphs Abs: 1.4 10*3/uL (ref 0.7–4.0)
Monocytes Relative: 5.5 % (ref 3.0–12.0)
Platelets: 183 10*3/uL (ref 150.0–400.0)
WBC: 6.7 10*3/uL (ref 4.5–10.5)

## 2010-04-12 LAB — BASIC METABOLIC PANEL
CO2: 27 mEq/L (ref 19–32)
Glucose, Bld: 138 mg/dL — ABNORMAL HIGH (ref 70–99)
Potassium: 3.7 mEq/L (ref 3.5–5.1)
Sodium: 135 mEq/L (ref 135–145)

## 2010-04-12 LAB — TSH: TSH: 2.34 u[IU]/mL (ref 0.35–5.50)

## 2010-04-12 MED ORDER — ATENOLOL 25 MG PO TABS: ORAL_TABLET | ORAL | Status: DC

## 2010-04-12 NOTE — Progress Notes (Signed)
  Subjective:    Patient ID: David Carr, male    DOB: 1945/08/21, 65 y.o.   MRN: [id number]  HPI David Carr is a 65 year old, married male, nonsmoker, who comes in today for evaluation of lightheadedness, hypertension, and rapid heart rate.  Over the weekend.  He felt lightheaded when he stood up.  He therefore stopped his beta blocker.  He continues to take the lisinopril 10 mg daily however, his blood pressure is 110/70.  His pulse is around 100.  EKG shows sinus tachycardia.  No atrial fibrillation. Blood sugar normal  Review of Systems    Negative Objective:   Physical Exam Well-developed well-nourished, male in no acute distress.  Lungs are clear.  Cardiac exam shows a pulse to be 100 and regular.  PMI not palpable.  S1, S2, within normal limits.  I can appreciate a grade 3/6 systolic ejection murmur consistent with mitral insufficiency.      EKG shows sinus tachycardia Assessment & Plan:  Sinus tachycardia, and hypertension,,,,,,,,,, plan stop the lisinopril take Tenormin 12.5 mg daily starting tomorrow morning.  Follow-up on Friday

## 2010-04-12 NOTE — Patient Instructions (Signed)
Hold the lisinopril and Tenoretic.  Began  Tenormin one half tab q.a.m. Starting tomorrow morning.  Check your pulse and blood pressure twice daily.  Return on Friday for follow-up

## 2010-04-15 ENCOUNTER — Ambulatory Visit (INDEPENDENT_AMBULATORY_CARE_PROVIDER_SITE_OTHER): Payer: Managed Care, Other (non HMO) | Admitting: Family Medicine

## 2010-04-15 ENCOUNTER — Encounter: Payer: Self-pay | Admitting: Family Medicine

## 2010-04-15 DIAGNOSIS — E109 Type 1 diabetes mellitus without complications: Secondary | ICD-10-CM

## 2010-04-15 DIAGNOSIS — I10 Essential (primary) hypertension: Secondary | ICD-10-CM

## 2010-04-15 DIAGNOSIS — E119 Type 2 diabetes mellitus without complications: Secondary | ICD-10-CM

## 2010-04-15 NOTE — Patient Instructions (Signed)
Return in June for y  annual exam.  Check your blood pressure daily for the next 4 weeks to be sure it stays normal.  Lab work one week prior to exam

## 2010-04-15 NOTE — Progress Notes (Signed)
  Subjective:    Patient ID: David Carr, male    DOB: [birth year], 65 y.o.   MRN: [id number]  HPIWalter is a 65 year old male, who comes in today for reevaluation of hypotension.  He is a history of hypertension and was on Tenoretic 50 -- 25 daily, lisinopril, 10 mg daily, and developed hypotension, lightheadedness, and a sensation of presyncope when he stood up.  We stopped the Tenoretic and the lisinopril,,,,,,,,,,, now his chronic cough is also going away,,,,,,,,, and placed him on Tenormin 12.5 mg daily.  He is back today feeling back to normal.  BP right arm sitting position 130/80.  Pulse 70 and regular    Review of Systems    Negative Objective:   Physical Exam Well-developed well-nourished, male in no acute distress.  BP right arm sitting position 130/80.  Pulse 70 and regular       Assessment & Plan:  Hypotension,,,,,,,,, resolved continue current therapy.  Follow-up in June for her annual physical

## 2010-04-25 ENCOUNTER — Telehealth: Payer: Self-pay | Admitting: Family Medicine

## 2010-04-25 NOTE — Telephone Encounter (Signed)
Patient request a call from the Dr's nurse he is having issues with his medication

## 2010-04-26 ENCOUNTER — Telehealth: Payer: Self-pay | Admitting: *Deleted

## 2010-04-26 NOTE — Telephone Encounter (Signed)
Pt would like to talk to [facility] or Dr. Tawanna Cooler about his meds.  He feels they are bringing his BSs down to lows.  Please call as she called yesterday and is getting concerned.

## 2010-04-26 NOTE — Telephone Encounter (Signed)
The beta blocker would not cause his blood sugar drop///////////// please review.  Medications

## 2010-04-26 NOTE — Telephone Encounter (Signed)
patient  States that he is taking half a tab of atenolol and this is causing his glucose to drop for the last week and half.  Any suggestions?

## 2010-04-28 NOTE — Telephone Encounter (Signed)
Fleet Contras please call the beta blocker would not cause his blood sugar dropped please review.  His medications and then will discuss our options also have him give you the number of his blood sugars

## 2010-04-29 NOTE — Telephone Encounter (Signed)
patient  Is aware and will call back for an appointment 

## 2010-05-06 ENCOUNTER — Other Ambulatory Visit: Payer: Self-pay

## 2010-05-10 ENCOUNTER — Ambulatory Visit: Payer: Self-pay | Admitting: Family Medicine

## 2010-05-13 ENCOUNTER — Ambulatory Visit: Payer: Self-pay | Admitting: Family Medicine

## 2010-06-28 NOTE — Assessment & Plan Note (Signed)
[facility]                                 ON-CALL NOTE   David Carr, David Carr                     MRN:          [id number]  DATE:09/22/2006                            DOB:          10/19/1945    PRIMARY CARE PHYSICIAN:  Dr. Tawanna Cooler.   The patient calls in stating that he has vertigo. He was advised to  follow up at the Saturday clinic.     Leanne Chang, M.D.  Electronically Signed    LA/MedQ  DD: 09/22/2006  DT: 09/23/2006  Job #: [id number]

## 2010-06-28 NOTE — Assessment & Plan Note (Signed)
[facility]                            CARDIOLOGY OFFICE NOTE   [name], [name]                   MRN:          [id number]  DATE:01/31/2007                            DOB:          23-Mar-1945    PRIMARY:  Dr. Alonza Smoker.   REASON FOR PRESENTATION:  Evaluate the patient with mitral  regurgitation.   HISTORY OF PRESENT ILLNESS:  The patient has a history of mitral  regurgitation.  We have been following this clinically.  He has had  absolutely no symptoms.  He says he can walk up a rather long incline  that he walks up and he does not get dyspnea.  He denies any resting  shortness of breath and has no PND or orthopnea.  He denies any chest  discomfort, neck or arm discomfort.  He has had no palpitations, pre-  syncope, or syncope.  He did see Dr. Tyrone Sage recently and is due to be  followed closely by him.  he did have an echocardiogram done early  November, which demonstrated that his ejection fraction was still well-  preserved.  His end-diastolic dimension is slightly higher, but still  within normal limits.  It was 53.  His end-systolic dimension has  increased a slight bit, but still again within normal limits.  He has  had a higher regurgitant volume, which was 18 mL, but now measures at 42  mL.  The regurgitant orifice is 0.29, and it was 0.16.  This  demonstrates still asymptomatic progression with a well-preserved  ejection fraction.   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  Nephrolithiasis.  Right elbow surgery.  Hernia  repair.  Diabetes.   ALLERGIES:  MORPHINE.   CURRENT MEDICATIONS:  1. Atenolol hydrochlorothiazide 50/25.  2. Glucophage 500 mg daily.  3. Simvastatin.   REVIEW OF SYSTEMS:  As stated in the HPI and otherwise negative for  other systems.   PHYSICAL EXAMINATION:  The patient is in no distress.  Blood pressure 123/66, heart rate 75 and regular.  HEENT:  Eyelids unremarkable.  Pupils are equal, round, and reactive to  light and accommodation.  Fundi are not visualized.  Oral mucosa  unremarkable.  NECK:  No jugular venous distension at 45 degrees, carotid upstroke  brisk and symmetric, no bruits, thyromegaly.  LYMPHATICS:  No cervical, axillary, or inguinal adenopathy.  LUNGS:  Clear to auscultation bilaterally.  BACK:  No costovertebral angle tenderness.  CHEST:  Unremarkable.  HEART:  PMI not displaced or sustained, S1 and S2 within normal limits,  no S3, no S4, 3/6 left-sided holosystolic murmur radiating slightly  anteriorly.  No diastolic murmurs.  ABDOMEN:  Obese, positive bowel sounds, normal in frequency and pitch,  no bruits, rebound, guarding.  No midline pulsatile mass, hepatomegaly,  splenomegaly.  SKIN:  No rashes, no nodules.  EXTREMITIES:  With 2+ pulses throughout, no edema, cyanosis, clubbing.  NEURO:  Oriented to person, place, and time, cranial nerves 2-12 grossly  intact, motor grossly intact.   EKG shows sinus rhythm with premature ventricular contraction.  Questionable old inferior infarct.  No acute ST-T wave changes.  Nonspecific diffuse T wave flattening.   ASSESSMENT AND PLAN:  1. Mitral regurgitation.  I reviewed and compared his echocardiogram      from February to November.  I reviewed these with colleagues.  It      is true that he has had some progression in the measurements of his      mitral regurgitation, though it still remains moderate.  He still      has a well-preserved ejection fraction, though his left ventricular      volumes are slightly higher.  He is completely asymptomatic by      careful questioning.  He does not want to have valve repair until      it is required, and at this point he does not meet any ACC/AHA      requirements for valve replacement or repair.  I think we are      approaching that and we need to follow him closely.  He is due to      see Dr. Tyrone Sage in 3  months.  If there has been any change in his      clinical status, we would need to image him, and I suggest a TEE.      I will plan to see him back in 6 months otherwise with a planned      echocardiogram.  2. Obesity.  Understands the need to lose weight with diet and      exercise.  3. Hypertension.  Blood pressure is well controlled, and he will      continue the medications as listed.     David Rotunda, MD, [facility]  Electronically Signed    JH/MedQ  DD: 01/31/2007  DT: 02/01/2007  Job #: [id number]   cc:   Sheliah Plane, MD  Eugenio Hoes Tawanna Cooler, MD

## 2010-06-28 NOTE — Assessment & Plan Note (Signed)
OFFICE VISIT   BABY, STAIRS  DOB:                                                    Jul 12, 2008  CHART #:  [id number]   David Carr  [id number]  The patient was referred at the request  of Dr. Tawanna Cooler because of a murmur of mitral insufficiency first discovered  in approximately 18 months ago.  Since that time he has seen Dr.  Antoine Poche, he had a TE done and in February of this year had a followup  transthoracic echo.  The patient is asymptomatic.  He notes that he  remains physically active, exercising and walking up to 2 miles a day  without any shortness of breath .  Denies any pedal edema.  Denies  dyspnea on exertion.  Denies nocturnal dyspnea. He has no chest pain.  Since last seen in February he has had no change in symptoms.   PHYSICAL EXAMINATION:  VITAL SIGNS:  Blood pressure 124/80, pulse 72 and  regular, respiratory rate 16, 02 sat is 94% on room air.  The patient is  still moderately over weight.  LUNGS:  Are clear.  HEART:  He has a 2-3/6 systolic murmur radiating to the left apex  consistent with mitral insufficiency.  ABDOMEN:  He has no liver  distention.  Abdominal exam is benign.  He has no palpable abdominal  aorta.  EXTREMITIES:  His lower extremities are without pedal edema.   On review of his previous echo's the most recent showed a left  ventricular enddiastolic dimension of 47.4.  A year previously it was  48.   The patient does not wish to proceed with any surgical intervention at  this time.  I have explained to him the pathophysiology of his mitral  insufficiency.  Prior to any surgery he will need a cardiac  catheterization which has never been performed.  I have made him a  return appointment  to see me in approximately 6 months with a followup echocardiogram.  He  is aware that should he develop any further symptoms to contact us right  away.   Sheliah Plane, MD  Electronically Signed   EG/MEDQ  D:   07/12/2006  T:  07/12/2006  Job:  [id number]   cc:   Rollene Rotunda, MD, [facility]  Tinnie Gens A. Tawanna Cooler, MD

## 2010-06-28 NOTE — Assessment & Plan Note (Signed)
[facility]                            CARDIOLOGY OFFICE NOTE   David Carr, David Carr                   MRN:          [id number]  DATE:01/27/2008                            DOB:          [birth year]    PRIMARY CARE PHYSICIAN:  Tinnie Gens A. Tawanna Cooler, MD   REASON FOR PRESENTATION:  Evaluate the patient with mitral  regurgitation.   HISTORY OF PRESENT ILLNESS:  The patient returns for 33-month followup.  He had an echo earlier this morning which demonstrates his EF to be 60-  70%.  He had mild mitral valve regurgitation.  There was no mention of  mitral valve prolapse.  Certainly, has not had any increase in his  regurgitation or prolapse compared to previous according to these  reports.   The patient has done very well symptomatically.  He denied any chest  pain or shortness of breath.  He has had no palpitation, presyncope or  syncope.  He has had no PND or orthopnea.   PAST MEDICAL HISTORY:  Prolapsing posterior/flail mitral leaflet with  moderate mitral regurgitation (as described, this appears to be less  pronounced on this exam), nephrolithiasis, right elbow surgery, hernia  repair, diabetes.   ALLERGIES AND INTOLERANCES:  MORPHINE.   MEDICATIONS:  1. Zocor 20 mg daily.  2. Glucophage 500 mg daily.  3. Tenoretic 50/25 daily.  4. Aspirin 325 mg daily.   REVIEW OF SYSTEMS:  As stated in the HPI and otherwise negative for  other systems.   PHYSICAL EXAMINATION:  GENERAL:  The patient is in no distress.  VITAL SIGNS:  Blood pressure 121/75, heart rate 83 and regular, weight  211 pounds, body mass index 31.  HEENT:  Eyes unremarkable.  Pupils equal, round, and reactive to light.  Fundi not visualized.  Oral mucosa unremarkable.  NECK:  No jugular venous distention at 45 degrees.  Carotid upstroke  brisk and symmetrical.  No bruits, no thyromegaly.  LYMPHATICS:  No  cervical, axillary, or inguinal adenopathy.  LUNGS:  Clear to auscultation  bilaterally.  BACK:  No costovertebral angle tenderness.  CHEST:  Unremarkable.  HEART:  PMI not displaced or sustained.  S1 and S2 within normal limits.  No S3, no S4.  No clicks, no rubs.  3/6 holosystolic murmur heard best  at the left axilla and slightly anteriorly, diastolic murmurs.  ABDOMEN:  Obese, positive bowel sounds normal in frequency and pitch.  No bruits, no rebound, no guarding.  No midline pulsatile mass.  No  organomegaly.  SKIN:  No rashes, no nodules.  EXTREMITIES:  2+ pulses.  No cyanosis, no clubbing, no edema.  NEUROLOGIC:  Grossly intact throughout.   EKG, sinus rhythm, rate 82, axis within normal limits, intervals within  normal limits, premature ventricular contractions, no acute ST-T wave  changes.   ASSESSMENT AND PLAN:  1. Mitral regurgitation.  The patient's mitral valve has now been      followed with serial exams and certainly is not getting worse.  It      actually seems to be less pronounced on the last couple of exams.  He has had no left atrial enlargement.  He has normal left      ventricular function.  He has absolutely no symptoms.  At this      point, no further cardiovascular testing is suggested for the next      year unless he has symptoms.  I do plan on seeing him back at that      time with a repeat echocardiogram to follow this.  I will review      the echo with Dr. Tenny Craw.  2. Obesity.  He understands the need to lose weight with diet and      excise.  3. Diabetes, per Dr. Tawanna Cooler.  4. Followup.  We will see the patient again in 1 year as described.     Rollene Rotunda, MD, [facility]  Electronically Signed    JH/MedQ  DD: 01/27/2008  DT: 01/28/2008  Job #: [id number]   cc:   Tinnie Gens A. Tawanna Cooler, MD

## 2010-06-28 NOTE — Assessment & Plan Note (Signed)
OFFICE VISIT   David Carr, David Carr  DOB:  1945-06-27                                        January 24, 2007  CHART #:  [id number]   REASON FOR OFFICE VISIT:  The patient comes in today for a followup  visit for evaluation of his mitral regurgitation.   He was originally seen in March 2007, when Dr. Tawanna Cooler noticed the patient  had a heart murmur which was not previously noted.  Evaluation with  echocardiogram showed that the patient had mitral regurgitation.  He has  had serial echocardiograms since that time.  He returns today after  having an echocardiogram done at the [facility] office dated December 17, 2006.  The patient remains asymptomatic.  He denies any shortness of  breath, pedal edema, dyspnea on exertion.  He is able to carry on his  usual activities without any difficulty.  He is not sure when he last  saw Dr. Antoine Poche.  He thought it was last month.  In the computer, it is  noted that he had not been back since February 2008.   PHYSICAL EXAMINATION:  Vital signs:  His blood pressure is 126/80, pulse  is 63, respiratory rate is 18, O2 sat is 96%.  Lungs:  Clear.  Heart:  He does have a murmur, 3/6, consistent with mitral regurgitation with  radiation to the left axilla.  He has no murmur of aortic insufficiency.  He has no pedal edema.  Abdomen:  Benign without palpable masses.  The  liver is not palpably enlarged.   TRANSTHORACIC ECHOCARDIOGRAM REPORT:  Reviewed, dated December 17, 2006,  which shows the left ventricular end diastolic dimension of 53-mm,  previously it was 48-mm.  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   Although the patient does not appear to have changed in symptoms over  the time we have observed him, we have seen some slight increasing left  ventricular end diastolic dimension.  I have discussed this with the  patient that it is likely that he will  need to move toward mitral valve  repair or replacement, preferably repair.  He has not seen Dr. Antoine Poche  for almost a year.  I have asked him to follow up with Dr. Antoine Poche to  discuss his view on the current situation and also the patient has never  had a cardiac catheterization, so as we plan surgical repair, he will  need to be set up for a cardiac catheterization.  I will wait on Dr.  Jenene Slicker evaluation.  I have made the patient a return appointment to  see me in 3 months or sooner if need be.   Sheliah Plane, MD  Electronically Signed   EG/MEDQ  D:  01/24/2007  T:  01/24/2007  Job:  [id number]   cc:   Rollene Rotunda, MD, [facility]

## 2010-06-28 NOTE — Assessment & Plan Note (Signed)
[facility]                            CARDIOLOGY OFFICE NOTE   EMERIL, STILLE                   MRN:          [id number]  DATE:07/25/2007                            DOB:          October 25, 1945    PRIMARY:  Dr. Alonza Smoker.   REASON FOR PRESENTATION:  Evaluate patient with mitral regurgitation.   HISTORY OF PRESENT ILLNESS:  the patient he is a 65 year old gentleman  with mitral regurgitation that we have been following clinically.  Since  I last saw him he has had no new symptoms.  He says he is quite active.  With this level of activity he denies any dyspnea at all.  He has had no  resting shortness of breath and has no PND or orthopnea.  He has no  chest discomfort, neck or arm discomfort.  He has had no palpitation,  presyncope or syncope.   Of note the patient did have an echo done in anticipation of this  appointment.  This was June 1.  This demonstrates his EF was 65%.  The  regurgitant mitral volume is actually lower than was measured  previously.  The left ventricular end-diastolic and systolic dimensions  were unchanged.  The left atrial dimension is 35 which is actually lower  than previous.  He does have the prolapse of the posterior leaflet  suggesting it to be flail with moderate MR.  This was certainly not  worse than his previous.   PAST MEDICAL HISTORY:  1. 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  2. Nephrolithiasis.  3. Right elbow surgery.  4. Hernia repair.  5. Diabetes.   ALLERGIES:  MORPHINE.   MEDICATIONS:  1. Zocor 20 mg daily.  2. Tenoretic 50/25 daily.  3. Glucophage 500 mg daily.  4. Aspirin 325 mg daily.   REVIEW OF SYSTEMS:  As stated in the HPI otherwise negative for other  systems.   PHYSICAL EXAMINATION:  The patient is in no distress.  Blood pressure  144/79, heart rate 82 and regular, weight 210 pounds, body mass index  30.  HEENT:  Eyelids  unremarkable, pupils equally round and reactive to  light, fundi not visualized, oral mucosa unremarkable.  NECK:  No jugular venous distention at 45 degrees, carotid upstroke  brisk and symmetric, no bruits, no thyromegaly.  LYMPHATICS:  No cervical, axillary, inguinal adenopathy.  LUNGS:  Clear to auscultation bilaterally.  BACK:  No costovertebral angle tenderness.  CHEST:  Unremarkable.  HEART:  PMI not displaced or sustained, S1 and S2 within normal limits,  no S3, no S4, no clicks, no rubs, 3/6 holosystolic murmur heard best at  the left axilla and slightly anteriorly, no diastolic murmurs.  ABDOMEN:  Flat, positive bowel sounds normal in frequency and pitch, no  bruits, no rebound, no guarding, no midline pulsatile mass, no  hepatomegaly, splenomegaly.  SKIN:  No rashes, no nodules.  EXTREMITIES:  With 2+ pulses throughout, no edema, no cyanosis, no  clubbing.  NEURO:  Oriented to person, place, and time; cranial nerves II-XII  grossly intact, motor grossly intact.  EKG sinus rhythm, rate 81, axis within normal limits, intervals within  normal limits, no acute ST-wave changes, cannot exclude old inferior  infarct, no change from previous EKGs except for the absence of PVC.   ASSESSMENT/PLAN:  1. Mitral regurgitation.  The patient is having no symptoms after      careful questioning.  After reviewing and comparing his previous      echoes there has been no change.  Certainly no worsening of his      mitral regurgitation.  Therefore, we can still follow this.  This      is his preference as well.  I told him we will have to watch this      closely and I will check it again with an echocardiogram in 6      months.  2. Hypertension.  Blood pressure is slightly elevated today but this      is unusual.  He will take his medications and continue his efforts      toward weight loss, try to control this.  3. Obesity.  We discussed the need for weight loss with diet and       exercise.  4. Diabetes per Dr. Tawanna Cooler.  5. Dyslipidemia per Dr. Tawanna Cooler.  Given his diabetes I would have a goal      for LDL less than 100 and HDL greater than 40.  6. Followup as above.     Rollene Rotunda, MD, [facility]  Electronically Signed    JH/MedQ  DD: 07/25/2007  DT: 07/25/2007  Job #: [id number]   cc:   Tinnie Gens A. Tawanna Cooler, MD

## 2010-07-01 NOTE — Letter (Signed)
May 25, 2009    To Whom It May Concern:   RE:  David Carr, David Carr  MRN:  [id number]  /  DOB:  29-Oct-1945   David Carr has been under my care for evaluation of a heart murmur.  However, an echocardiogram has suggested that he only has mild mitral  regurgitation.  He has no significant symptoms related to this.  At this  point, no further evaluation is planned.  He is having treatment for  hypotension.  There is no contraindication from a cardiovascular  standpoint to participation in your study pending evaluation by your  investigators.    Sincerely,      Rollene Rotunda, MD, [facility]    JH/MedQ  DD: 05/25/2009  DT: 05/26/2009  Job #: [id number]

## 2010-07-25 ENCOUNTER — Other Ambulatory Visit (INDEPENDENT_AMBULATORY_CARE_PROVIDER_SITE_OTHER): Payer: Managed Care, Other (non HMO)

## 2010-07-25 DIAGNOSIS — I10 Essential (primary) hypertension: Secondary | ICD-10-CM

## 2010-07-25 DIAGNOSIS — E109 Type 1 diabetes mellitus without complications: Secondary | ICD-10-CM

## 2010-07-25 LAB — CBC WITH DIFFERENTIAL/PLATELET
Basophils Relative: 0.3 % (ref 0.0–3.0)
Eosinophils Relative: 0.6 % (ref 0.0–5.0)
Hemoglobin: 16.1 g/dL (ref 13.0–17.0)
Lymphocytes Relative: 27.3 % (ref 12.0–46.0)
Monocytes Relative: 5.9 % (ref 3.0–12.0)
Neutro Abs: 3.3 10*3/uL (ref 1.4–7.7)
Neutrophils Relative %: 65.9 % (ref 43.0–77.0)
RBC: 5.12 Mil/uL (ref 4.22–5.81)
WBC: 5 10*3/uL (ref 4.5–10.5)

## 2010-07-25 LAB — POCT URINALYSIS DIPSTICK
Leukocytes, UA: NEGATIVE
Nitrite, UA: POSITIVE
pH, UA: 5

## 2010-07-25 LAB — HEMOGLOBIN A1C: Hgb A1c MFr Bld: 6.5 % (ref 4.6–6.5)

## 2010-07-25 LAB — BASIC METABOLIC PANEL
CO2: 28 mEq/L (ref 19–32)
Calcium: 9.3 mg/dL (ref 8.4–10.5)
Creatinine, Ser: 1 mg/dL (ref 0.4–1.5)
GFR: 82.07 mL/min (ref >60.00)
Sodium: 139 mEq/L (ref 135–145)

## 2010-07-25 LAB — HEPATIC FUNCTION PANEL
AST: 21 U/L (ref 0–37)
Albumin: 4.3 g/dL (ref 3.5–5.2)
Alkaline Phosphatase: 62 U/L (ref 39–117)
Bilirubin, Direct: 0.2 mg/dL (ref 0.0–0.3)
Total Protein: 7.1 g/dL (ref 6.0–8.3)

## 2010-07-25 LAB — LIPID PANEL
HDL: 42 mg/dL (ref >39.00)
Total CHOL/HDL Ratio: 3

## 2010-07-25 LAB — PSA: PSA: 1.23 ng/mL (ref 0.10–4.00)

## 2010-08-01 ENCOUNTER — Encounter: Payer: Self-pay | Admitting: Family Medicine

## 2010-08-01 ENCOUNTER — Ambulatory Visit (INDEPENDENT_AMBULATORY_CARE_PROVIDER_SITE_OTHER): Payer: Managed Care, Other (non HMO) | Admitting: Family Medicine

## 2010-08-01 VITALS — BP 120/80 | Temp 98.8°F | Wt 210.0 lb

## 2010-08-01 DIAGNOSIS — E109 Type 1 diabetes mellitus without complications: Secondary | ICD-10-CM

## 2010-08-01 NOTE — Progress Notes (Signed)
  Subjective:    Patient ID: David Carr, male    DOB: 10/07/1945, 65 y.o.   MRN: [id number]  HPIWalter is a delightful, 66 year old, married male, nonsmoker, who comes in today for evaluation of diabetes, type I.  Limited his physical examination in February.  Blood sugar was 138A1c was 7.0%, and we were not able to increase his oral medications because of side effects.  He therefore elected to start him on low-dose insulin 10 units nightly.  Blood sugar about the same however, his A1c has dropped to 6.5%.  No hypoglycemia    Review of Systems    General and metabolic review of systems otherwise negative Objective:   Physical Exam    Well-developed well-nourished, male in no acute distress    Assessment & Plan:  Diabetes type 1, under excellent control with A1c is 6.5.  Continue current therapy.  Follow-up February 2013 annual exam

## 2010-08-01 NOTE — Patient Instructions (Signed)
Continue current medications.  Follow-up in February 2013 for your annual exam.  Remember to get  y  labs one week prior

## 2010-08-03 ENCOUNTER — Telehealth: Payer: Self-pay | Admitting: Family Medicine

## 2010-08-03 NOTE — Telephone Encounter (Signed)
Pt called his ins co re: date last cpx. Pt said that according to his insurance company he is due for cpx anytime, but Dr Tawanna Cooler told pt to sch in Feb of next year. Pt has already had cpx labs done and pt is req work in cpx asap so labs will still be effective.

## 2010-08-09 ENCOUNTER — Ambulatory Visit (INDEPENDENT_AMBULATORY_CARE_PROVIDER_SITE_OTHER): Payer: Managed Care, Other (non HMO) | Admitting: Family Medicine

## 2010-08-09 ENCOUNTER — Encounter: Payer: Self-pay | Admitting: Family Medicine

## 2010-08-09 DIAGNOSIS — Z2911 Encounter for prophylactic immunotherapy for respiratory syncytial virus (RSV): Secondary | ICD-10-CM

## 2010-08-09 DIAGNOSIS — E109 Type 1 diabetes mellitus without complications: Secondary | ICD-10-CM

## 2010-08-09 DIAGNOSIS — I080 Rheumatic disorders of both mitral and aortic valves: Secondary | ICD-10-CM

## 2010-08-09 DIAGNOSIS — L989 Disorder of the skin and subcutaneous tissue, unspecified: Secondary | ICD-10-CM

## 2010-08-09 DIAGNOSIS — I10 Essential (primary) hypertension: Secondary | ICD-10-CM

## 2010-08-09 DIAGNOSIS — Z0000 Encounter for general adult medical examination without abnormal findings: Secondary | ICD-10-CM

## 2010-08-09 DIAGNOSIS — Z23 Encounter for immunization: Secondary | ICD-10-CM

## 2010-08-09 DIAGNOSIS — E785 Hyperlipidemia, unspecified: Secondary | ICD-10-CM

## 2010-08-09 DIAGNOSIS — D2330 Other benign neoplasm of skin of unspecified part of face: Secondary | ICD-10-CM

## 2010-08-09 DIAGNOSIS — L0390 Cellulitis, unspecified: Secondary | ICD-10-CM

## 2010-08-09 DIAGNOSIS — L0291 Cutaneous abscess, unspecified: Secondary | ICD-10-CM

## 2010-08-09 MED ORDER — CEPHALEXIN 500 MG PO CAPS: ORAL_CAPSULE | ORAL | Status: AC

## 2010-08-09 NOTE — Patient Instructions (Signed)
Continue your current medications.  Take Keflex two tabs twice daily for the spider bite.  Return in 6 months for follow-up on your blood sugar, sooner if any problems

## 2010-08-09 NOTE — Progress Notes (Signed)
  Subjective:    Patient ID: David Carr, male    DOB: April 03, 1945, 65 y.o.   MRN: [id number]  HPI [name] is a 65 year old male, married, nonsmoker, who comes in today for general physical examination because of a history of hypertension, diabetes, type I, hyperlipidemia, mitral valve disease.  He also has a mole on the right side of his face.  This thing growing he would like removed.  He takes Tenormin 12.5 mg daily for hypertension.  BP 120/76.  He takes insulin, lisinopril 75 -- 2510 units nightly and metformin 500 mg b.i.d. Blood sugars are normal.  He takes Zocor 20 mg nightly for hyperlipidemia, and an 81 mg, baby aspirin.  He also has a history of mitral insufficiency with no murmur.  I referred him to Dr. Leta Jungling, who also has consulted with Dr. Merian Capron.  He is under observation q. year   Review of Systems  Constitutional: Negative.   HENT: Negative.   Eyes: Negative.   Respiratory: Negative.   Cardiovascular: Negative.   Gastrointestinal: Negative.   Genitourinary: Negative.   Musculoskeletal: Negative.   Skin: Negative.   Neurological: Negative.   Hematological: Negative.   Psychiatric/Behavioral: Negative.        Objective:   Physical Exam  Constitutional: He is oriented to person, place, and time. He appears well-developed and well-nourished.  HENT:  Head: Normocephalic and atraumatic.  Right Ear: External ear normal.  Left Ear: External ear normal.  Nose: Nose normal.  Mouth/Throat: Oropharynx is clear and moist.  Eyes: Conjunctivae and EOM are normal. Pupils are equal, round, and reactive to light.  Neck: Normal range of motion. Neck supple. No JVD present. No tracheal deviation present. No thyromegaly present.  Cardiovascular: Normal rate, regular rhythm and intact distal pulses.  Exam reveals gallop. Exam reveals no friction rub.   No murmur heard.      Grade 2 to 3 systolic murmur consistent with MR  Pulmonary/Chest: Effort normal and breath sounds  normal. No stridor. No respiratory distress. He has no wheezes. He has no rales. He exhibits no tenderness.  Abdominal: Soft. Bowel sounds are normal. He exhibits no distension and no mass. There is no tenderness. There is no rebound and no guarding.  Genitourinary: Rectum normal, prostate normal and penis normal. Guaiac negative stool. No penile tenderness.  Musculoskeletal: Normal range of motion. He exhibits no edema and no tenderness.  Lymphadenopathy:    He has no cervical adenopathy.  Neurological: He is alert and oriented to person, place, and time. He has normal reflexes. No cranial nerve deficit. He exhibits normal muscle tone.  Skin: Skin is warm and dry. No rash noted. No erythema. No pallor.       Adenoma lesion, right face, 5 mm x 5 mm was removed  Psychiatric: He has a normal mood and affect. His behavior is normal. Judgment and thought content normal.          Assessment & Plan:  Healthy male.  Hypertension.  Continue Tenormin 25 one half tab daily.  Diabetes type I.  Continue insulin 10 units nightly and metformin 500 b.i.d.  Hyperlipidemia.  Continue Zocor 20 nightly  Mole removal, right face, sent for pathology.  Insect bite, left shoulder, consistent with a spider bite secondary infection put on Keflex 1 g b.i.d.

## 2010-08-18 NOTE — Progress Notes (Signed)
patient  Is aware 

## 2010-09-02 ENCOUNTER — Telehealth: Payer: Self-pay | Admitting: Cardiology

## 2010-09-02 DIAGNOSIS — I059 Rheumatic mitral valve disease, unspecified: Secondary | ICD-10-CM

## 2010-09-02 NOTE — Telephone Encounter (Signed)
Pt needs to set up echo and OV he will be at this number for an hour

## 2010-09-02 NOTE — Telephone Encounter (Signed)
Patient would like to scheduled an echo and O/V with Dr. Antoine Poche. Pt. Is requesting to schedule the echo in the morning and O/v in the after noon. Echo and O/V  Need to be scheduled for December 2012. Please call pt on Monday 09/05/10 between 8:30 and 12:00 Noon.

## 2010-10-26 ENCOUNTER — Other Ambulatory Visit: Payer: Self-pay | Admitting: Family Medicine

## 2010-12-06 ENCOUNTER — Other Ambulatory Visit: Payer: Self-pay | Admitting: *Deleted

## 2010-12-06 DIAGNOSIS — I10 Essential (primary) hypertension: Secondary | ICD-10-CM

## 2010-12-06 MED ORDER — SIMVASTATIN 20 MG PO TABS: 20.0000 mg | ORAL_TABLET | Freq: Every day | ORAL | Status: DC

## 2010-12-06 MED ORDER — METFORMIN HCL 500 MG PO TABS: 500.0000 mg | ORAL_TABLET | Freq: Two times a day (BID) | ORAL | Status: DC

## 2010-12-06 MED ORDER — INSULIN LISPRO PROT & LISPRO (75-25 MIX) 100 UNIT/ML ~~LOC~~ SUSP: 10.0000 [IU] | Freq: Every day | SUBCUTANEOUS | Status: DC

## 2010-12-06 MED ORDER — ATENOLOL 25 MG PO TABS: ORAL_TABLET | ORAL | Status: DC

## 2010-12-06 MED ORDER — GLUCOSE BLOOD VI STRP: 1.0000 | ORAL_STRIP | Freq: Every day | Status: DC

## 2011-01-16 ENCOUNTER — Other Ambulatory Visit (HOSPITAL_COMMUNITY): Payer: Managed Care, Other (non HMO) | Admitting: Radiology

## 2011-01-23 ENCOUNTER — Ambulatory Visit (HOSPITAL_COMMUNITY): Payer: Managed Care, Other (non HMO) | Attending: Internal Medicine | Admitting: Radiology

## 2011-01-23 DIAGNOSIS — E785 Hyperlipidemia, unspecified: Secondary | ICD-10-CM | POA: Insufficient documentation

## 2011-01-23 DIAGNOSIS — I079 Rheumatic tricuspid valve disease, unspecified: Secondary | ICD-10-CM | POA: Insufficient documentation

## 2011-01-23 DIAGNOSIS — E119 Type 2 diabetes mellitus without complications: Secondary | ICD-10-CM | POA: Insufficient documentation

## 2011-01-23 DIAGNOSIS — I10 Essential (primary) hypertension: Secondary | ICD-10-CM | POA: Insufficient documentation

## 2011-01-23 DIAGNOSIS — I059 Rheumatic mitral valve disease, unspecified: Secondary | ICD-10-CM | POA: Insufficient documentation

## 2011-01-27 ENCOUNTER — Ambulatory Visit (INDEPENDENT_AMBULATORY_CARE_PROVIDER_SITE_OTHER): Payer: Managed Care, Other (non HMO) | Admitting: Cardiology

## 2011-01-27 ENCOUNTER — Encounter: Payer: Self-pay | Admitting: Cardiology

## 2011-01-27 VITALS — BP 142/70 | HR 82 | Resp 18 | Ht 69.0 in | Wt 213.0 lb

## 2011-01-27 DIAGNOSIS — I10 Essential (primary) hypertension: Secondary | ICD-10-CM

## 2011-01-27 DIAGNOSIS — I080 Rheumatic disorders of both mitral and aortic valves: Secondary | ICD-10-CM

## 2011-01-27 DIAGNOSIS — E663 Overweight: Secondary | ICD-10-CM

## 2011-01-27 NOTE — Patient Instructions (Signed)
The current medical regimen is effective;  continue present plan and medications.  Follow up in 1 year with Dr Hochrein.  You will receive a letter in the mail 2 months before you are due.  Please call us when you receive this letter to schedule your follow up appointment.  

## 2011-01-27 NOTE — Assessment & Plan Note (Signed)
His mitral regurgitation is mild to moderate and stable. He is having no symptoms. No change in therapy is indicated. I will continue to follow him clinically.

## 2011-01-27 NOTE — Assessment & Plan Note (Signed)
The patient understands the need to lose weight with diet and exercise. We have discussed specific strategies for this.  

## 2011-01-27 NOTE — Progress Notes (Signed)
   HPI The patient presents for followup of his mitral regurgitation. It has been one year since his last evaluation. He did have an echocardiogram a couple of days ago and I reviewed this. He has mild to moderate mitral regurgitation and a well preserved ejection fraction. The patient denies any new symptoms such as chest discomfort, neck or arm discomfort. There has been no new shortness of breath, PND or orthopnea. There have been no reported palpitations, presyncope or syncope.  Allergies  Allergen Reactions  . Morphine And Related     Shuts bladder down.    Current Outpatient Prescriptions  Medication Sig Dispense Refill  . aspirin 81 MG tablet Take 81 mg by mouth daily.        Marland Kitchen atenolol (TENORMIN) 25 MG tablet One half tab p.o. Q.a.m.  100 tablet  3  . Glucose Blood (FREESTYLE LITE TEST VI) by In Vitro route.        Marland Kitchen glucose blood test strip 1 each by Other route daily.  100 each  12  . insulin lispro protamine-insulin lispro (HUMALOG 75/25) (75-25) 100 UNIT/ML SUSP Inject 10 Units into the skin at bedtime.  10 mL  6  . INSULIN SYRINGE .5CC/29G 29G X 1/2" 0.5 ML MISC by Does not apply route.        . Lancets (FREESTYLE) lancets 1 each by Other route as needed. Use as instructed       . metFORMIN (GLUCOPHAGE) 500 MG tablet Take 1 tablet (500 mg total) by mouth 2 (two) times daily with a meal.  200 tablet  2  . simvastatin (ZOCOR) 20 MG tablet Take 1 tablet (20 mg total) by mouth at bedtime.  100 tablet  2    Past Medical History  Diagnosis Date  . Allergy   . Diabetes mellitus   . Asthma   . Hyperlipidemia   . Hypertension   . Urinary tract disorder     outlet obstruction following surgery  . Mitral insufficiency     Past Surgical History  Procedure Date  . Inguinal hernia repair     left  . Elbow surgery   . Rotator cuff repair     ROS:  As stated in the HPI and negative for all other systems.  PHYSICAL EXAM BP 142/70  Pulse 82  Resp 18  Ht 5\' 9"  (1.753 m)   Wt 213 lb (96.616 kg)  BMI 31.45 kg/m2 GENERAL:  Well appearing HEENT:  Pupils equal round and reactive, fundi not visualized, oral mucosa unremarkable NECK:  No jugular venous distention, waveform within normal limits, carotid upstroke brisk and symmetric, no bruits, no thyromegaly LYMPHATICS:  No cervical, inguinal adenopathy LUNGS:  Clear to auscultation bilaterally BACK:  No CVA tenderness CHEST:  Unremarkable HEART:  PMI not displaced or sustained,S1 and S2 within normal limits, no S3, no S4, no clicks, no rubs, apical systolic murmur brief and slightly radiating to the apex. ABD:  Flat, positive bowel sounds normal in frequency in pitch, no bruits, no rebound, no guarding, no midline pulsatile mass, no hepatomegaly, no splenomegaly EXT:  2 plus pulses throughout, no edema, no cyanosis no clubbing SKIN:  No rashes no nodules NEURO:  Cranial nerves II through XII grossly intact, motor grossly intact throughout PSYCH:  Cognitively intact, oriented to person place and time  EKG:  On this rhythm with premature ectopic contractions, questionable old inferior infarct. No acute ST-T wave changes.  01/27/2011   ASSESSMENT AND PLAN

## 2011-01-27 NOTE — Assessment & Plan Note (Signed)
The blood pressure is at target. No change in medications is indicated. We will continue with therapeutic lifestyle changes (TLC).  

## 2011-02-09 ENCOUNTER — Other Ambulatory Visit (INDEPENDENT_AMBULATORY_CARE_PROVIDER_SITE_OTHER): Payer: Managed Care, Other (non HMO)

## 2011-02-09 DIAGNOSIS — E109 Type 1 diabetes mellitus without complications: Secondary | ICD-10-CM

## 2011-02-09 DIAGNOSIS — Z23 Encounter for immunization: Secondary | ICD-10-CM

## 2011-02-09 LAB — HEMOGLOBIN A1C: Hgb A1c MFr Bld: 6.7 % — ABNORMAL HIGH (ref 4.6–6.5)

## 2011-02-09 LAB — BASIC METABOLIC PANEL
CO2: 30 mEq/L (ref 19–32)
Chloride: 104 mEq/L (ref 96–112)
Creatinine, Ser: 1 mg/dL (ref 0.4–1.5)
Sodium: 142 mEq/L (ref 135–145)

## 2011-03-07 ENCOUNTER — Telehealth: Payer: Self-pay | Admitting: *Deleted

## 2011-03-07 NOTE — Telephone Encounter (Signed)
Pt is asking for lab results from 01/2011.

## 2011-03-09 NOTE — Telephone Encounter (Signed)
Pt is requesting lab results. Thanks 

## 2011-03-09 NOTE — Telephone Encounter (Signed)
Patient is aware of labs and appointment is made

## 2011-04-11 ENCOUNTER — Other Ambulatory Visit: Payer: Managed Care, Other (non HMO)

## 2011-04-17 ENCOUNTER — Encounter: Payer: Managed Care, Other (non HMO) | Admitting: Family Medicine

## 2011-04-21 ENCOUNTER — Encounter: Payer: Self-pay | Admitting: Family Medicine

## 2011-07-25 ENCOUNTER — Other Ambulatory Visit (INDEPENDENT_AMBULATORY_CARE_PROVIDER_SITE_OTHER): Payer: Managed Care, Other (non HMO)

## 2011-07-25 DIAGNOSIS — Z0000 Encounter for general adult medical examination without abnormal findings: Secondary | ICD-10-CM

## 2011-07-25 LAB — POCT URINALYSIS DIPSTICK
Spec Grav, UA: >=1.03
Urobilinogen, UA: 0.2
pH, UA: 5

## 2011-07-25 LAB — CBC WITH DIFFERENTIAL/PLATELET
Basophils Absolute: 0 10*3/uL (ref 0.0–0.1)
Eosinophils Absolute: 0.1 10*3/uL (ref 0.0–0.7)
HCT: 47.9 % (ref 39.0–52.0)
Hemoglobin: 15.9 g/dL (ref 13.0–17.0)
Lymphs Abs: 1.5 10*3/uL (ref 0.7–4.0)
MCHC: 33.2 g/dL (ref 30.0–36.0)
MCV: 92 fl (ref 78.0–100.0)
Neutro Abs: 3.4 10*3/uL (ref 1.4–7.7)
RDW: 14 % (ref 11.5–14.6)

## 2011-07-25 LAB — HEPATIC FUNCTION PANEL
AST: 21 U/L (ref 0–37)
Albumin: 3.9 g/dL (ref 3.5–5.2)

## 2011-07-25 LAB — TSH: TSH: 3.82 u[IU]/mL (ref 0.35–5.50)

## 2011-07-25 LAB — BASIC METABOLIC PANEL
CO2: 28 mEq/L (ref 19–32)
Chloride: 106 mEq/L (ref 96–112)
Glucose, Bld: 133 mg/dL — ABNORMAL HIGH (ref 70–99)
Potassium: 4.4 mEq/L (ref 3.5–5.1)
Sodium: 143 mEq/L (ref 135–145)

## 2011-07-25 LAB — LIPID PANEL
HDL: 39.9 mg/dL (ref >39.00)
Triglycerides: 86 mg/dL (ref 0.0–149.0)

## 2011-07-25 LAB — MICROALBUMIN / CREATININE URINE RATIO: Microalb, Ur: 2.1 mg/dL — ABNORMAL HIGH (ref 0.0–1.9)

## 2011-07-31 ENCOUNTER — Encounter: Payer: Self-pay | Admitting: Family Medicine

## 2011-07-31 ENCOUNTER — Ambulatory Visit (INDEPENDENT_AMBULATORY_CARE_PROVIDER_SITE_OTHER): Payer: Managed Care, Other (non HMO) | Admitting: Family Medicine

## 2011-07-31 VITALS — BP 150/80 | Temp 99.0°F | Wt 217.0 lb

## 2011-07-31 DIAGNOSIS — E785 Hyperlipidemia, unspecified: Secondary | ICD-10-CM

## 2011-07-31 DIAGNOSIS — Z0000 Encounter for general adult medical examination without abnormal findings: Secondary | ICD-10-CM

## 2011-07-31 DIAGNOSIS — E663 Overweight: Secondary | ICD-10-CM

## 2011-07-31 DIAGNOSIS — E109 Type 1 diabetes mellitus without complications: Secondary | ICD-10-CM

## 2011-07-31 DIAGNOSIS — I080 Rheumatic disorders of both mitral and aortic valves: Secondary | ICD-10-CM

## 2011-07-31 DIAGNOSIS — I10 Essential (primary) hypertension: Secondary | ICD-10-CM

## 2011-07-31 MED ORDER — METFORMIN HCL 500 MG PO TABS: 500.0000 mg | ORAL_TABLET | Freq: Two times a day (BID) | ORAL | Status: DC

## 2011-07-31 MED ORDER — SIMVASTATIN 20 MG PO TABS: 20.0000 mg | ORAL_TABLET | Freq: Every day | ORAL | Status: DC

## 2011-07-31 MED ORDER — ATENOLOL 25 MG PO TABS: ORAL_TABLET | ORAL | Status: DC

## 2011-07-31 MED ORDER — INSULIN LISPRO PROT & LISPRO (75-25 MIX) 100 UNIT/ML ~~LOC~~ SUSP: 10.0000 [IU] | Freq: Every day | SUBCUTANEOUS | Status: DC

## 2011-07-31 MED ORDER — "INSULIN SYRINGE 29G X 1/2"" 0.5 ML MISC": Status: DC

## 2011-07-31 MED ORDER — GLUCOSE BLOOD VI STRP: 1.0000 | ORAL_STRIP | Freq: Every day | Status: DC

## 2011-07-31 MED ORDER — FREESTYLE LANCETS MISC: Status: DC

## 2011-07-31 NOTE — Progress Notes (Signed)
  Subjective:    Patient ID: David Carr, male    DOB: 10-Aug-1945, 66 y.o.   MRN: [id number]  HPI [name] is a 66 year old married male nonsmoker who comes in today for a Medicare wellness examination because of a history of hypertension, diabetes type 1, hyperlipidemia  His meds were reviewed in detail and there've been no changes.  He gets routine eye care, dental care, screening colonoscopy, tetanus 2011, Pneumovax x2, shingles 2012.  Cognitive function normal he does all his own finances. Home health safety reviewed no issues identified, no guns in the house, he does have a health care power of attorney and living will   Review of Systems  Constitutional: Negative.   HENT: Negative.   Eyes: Negative.   Respiratory: Negative.   Cardiovascular: Negative.   Gastrointestinal: Negative.   Genitourinary: Negative.   Musculoskeletal: Negative.   Skin: Negative.   Neurological: Negative.   Hematological: Negative.   Psychiatric/Behavioral: Negative.        Objective:   Physical Exam  Constitutional: He is oriented to person, place, and time. He appears well-developed and well-nourished.  HENT:  Head: Normocephalic and atraumatic.  Right Ear: External ear normal.  Left Ear: External ear normal.  Nose: Nose normal.  Mouth/Throat: Oropharynx is clear and moist.  Eyes: Conjunctivae and EOM are normal. Pupils are equal, round, and reactive to light.  Neck: Normal range of motion. Neck supple. No JVD present. No tracheal deviation present. No thyromegaly present.  Cardiovascular: Normal rate, regular rhythm, normal heart sounds and intact distal pulses.  Exam reveals no gallop and no friction rub.   No murmur heard. Pulmonary/Chest: Effort normal and breath sounds normal. No stridor. No respiratory distress. He has no wheezes. He has no rales. He exhibits no tenderness.  Abdominal: Soft. Bowel sounds are normal. He exhibits no distension and no mass. There is no tenderness. There  is no rebound and no guarding.  Genitourinary: Rectum normal, prostate normal and penis normal. Guaiac negative stool. No penile tenderness.  Musculoskeletal: Normal range of motion. He exhibits no edema and no tenderness.  Lymphadenopathy:    He has no cervical adenopathy.  Neurological: He is alert and oriented to person, place, and time. He has normal reflexes. No cranial nerve deficit. He exhibits normal muscle tone.  Skin: Skin is warm and dry. No rash noted. No erythema. No pallor.  Psychiatric: He has a normal mood and affect. His behavior is normal. Judgment and thought content normal.          Assessment & Plan:  Healthy male  Hypertension continue current meds  Diabetes type 1 continue current meds A1c today 6.8 followup in 6 months  Hyperlipidemia continue Zocor 20 lipids are at goal LDL 65  Overweight continue diet exercise and weight loss

## 2011-07-31 NOTE — Patient Instructions (Signed)
Continue your current medications  Followup in 6 months labs one week prior

## 2011-08-10 ENCOUNTER — Telehealth: Payer: Self-pay | Admitting: Family Medicine

## 2011-08-10 DIAGNOSIS — E785 Hyperlipidemia, unspecified: Secondary | ICD-10-CM

## 2011-08-10 DIAGNOSIS — I10 Essential (primary) hypertension: Secondary | ICD-10-CM

## 2011-08-10 MED ORDER — ATENOLOL 25 MG PO TABS: ORAL_TABLET | ORAL | Status: DC

## 2011-08-10 NOTE — Telephone Encounter (Signed)
Patient called stating that [facility] has not shipped his atenolol due to the dispensing directions not being specified by the MD. Patient will not receive his rx until Monday and will need at least 2 pills sent to [facility]. Please assist.

## 2011-08-10 NOTE — Telephone Encounter (Signed)
Spoke with pharmacist  

## 2011-08-10 NOTE — Telephone Encounter (Signed)
Pharmacist/Chris needs complete instructions on Atenolol RX sent into [facility], please f/u.

## 2011-08-10 NOTE — Telephone Encounter (Signed)
Pt calling back about Atenolol rx. States he wants to make sure that at least 2 pills have been sent to local pharmacy [facility] while he is waiting on mail order rx to come. They have told him mail order rx is on the way. He does not need a full month supply to local pharmacy. He also wants to make sure that 29 gauge needle ok to use for his injections because he was using 30 gauge needles. He would like to know before he opens the box that came for 29 gauge.

## 2011-10-26 ENCOUNTER — Telehealth: Payer: Self-pay | Admitting: Family Medicine

## 2011-10-26 NOTE — Telephone Encounter (Signed)
Caller: Davie/Patient; Phone: [phone]; Reason for Call: Patient request to speak with Fleet Contras regarding a flu shot.  Please call him back.  Thanks

## 2011-10-27 NOTE — Telephone Encounter (Signed)
Spoke with patient and he will schedule a flu shot

## 2011-11-03 ENCOUNTER — Ambulatory Visit: Payer: Managed Care, Other (non HMO)

## 2011-11-10 ENCOUNTER — Ambulatory Visit (INDEPENDENT_AMBULATORY_CARE_PROVIDER_SITE_OTHER): Payer: Managed Care, Other (non HMO)

## 2011-11-10 DIAGNOSIS — Z23 Encounter for immunization: Secondary | ICD-10-CM

## 2011-11-27 ENCOUNTER — Telehealth: Payer: Self-pay | Admitting: *Deleted

## 2011-11-27 ENCOUNTER — Telehealth: Payer: Self-pay | Admitting: Cardiology

## 2011-11-27 NOTE — Telephone Encounter (Signed)
Please see prior office note.  Need to review with Dr Antoine Poche to obtain echo order

## 2011-11-27 NOTE — Telephone Encounter (Signed)
Pt calling to schedule his yearly follow up and requests to have 2 D Echo  - will obtain order from Dr Antoine Poche if necessary for pt to repeat echo prior to appt due in December.

## 2011-11-27 NOTE — Telephone Encounter (Signed)
Per pt call he wants to schedule in the early part of December and he wants to do echo at 7:30a and then come back that afternoon after lunch because he doesn't want to sit around waiting for it to be read because he can go get some work done and then come back

## 2011-11-29 NOTE — Telephone Encounter (Signed)
Message was left (on pt's cell number) for pt to call back to schedule his follow up appt with Dr Antoine Poche.

## 2011-11-29 NOTE — Telephone Encounter (Signed)
Pt called back and was extremely rude to staff.  He was upset that someone had called his wife's phone number to contact him.  Explained to pt that her phone number is the only number we have to contact him.  He went on to say he had his phone number removed from his chart because we called too many times with reminders of his appts.  He does not want to have his phone number put in the chart but does expect to be called for appt scheduling without [place] calling his wife's number.  Pt is aware that Dr Antoine Poche is reviewing his chart to determine if he wants him to have another echo this year.  We will call him to let him know once the decision has been made.  The pts phone number is [phone].

## 2011-11-29 NOTE — Telephone Encounter (Signed)
I will not order an echocardiogram prior to this appt.  I will see and then make a decision.

## 2011-11-29 NOTE — Telephone Encounter (Signed)
F/U  Pt calling to f/u on Echo request.  He would like a return call from [facility]. At hm#

## 2012-01-22 ENCOUNTER — Encounter: Payer: Self-pay | Admitting: Cardiology

## 2012-01-22 ENCOUNTER — Ambulatory Visit (INDEPENDENT_AMBULATORY_CARE_PROVIDER_SITE_OTHER): Payer: Managed Care, Other (non HMO) | Admitting: Cardiology

## 2012-01-22 VITALS — BP 140/80 | HR 77 | Ht 68.0 in | Wt 219.6 lb

## 2012-01-22 DIAGNOSIS — E785 Hyperlipidemia, unspecified: Secondary | ICD-10-CM

## 2012-01-22 DIAGNOSIS — I080 Rheumatic disorders of both mitral and aortic valves: Secondary | ICD-10-CM

## 2012-01-22 DIAGNOSIS — I10 Essential (primary) hypertension: Secondary | ICD-10-CM

## 2012-01-22 NOTE — Patient Instructions (Addendum)

## 2012-01-22 NOTE — Progress Notes (Signed)
HPI The patient presents for followup of his mitral regurgitation. It has been one year since his last evaluation.  He has mild to moderate mitral regurgitation and a well preserved ejection fraction. The patient denies any new symptoms such as chest discomfort, neck or arm discomfort. There has been no new shortness of breath, PND or orthopnea. There have been no reported palpitations, presyncope or syncope.  He walks his dog frequently for exercise.    Allergies  Allergen Reactions  . Morphine And Related     Shuts bladder down.    Current Outpatient Prescriptions  Medication Sig Dispense Refill  . aspirin 81 MG tablet Take 81 mg by mouth daily.        Marland Kitchen atenolol (TENORMIN) 25 MG tablet One half tab p.o.  30 tablet  3  . Glucose Blood (FREESTYLE LITE TEST VI) by In Vitro route.        Marland Kitchen glucose blood test strip 1 each by Other route daily.  100 each  3  . insulin lispro protamine-insulin lispro (HUMALOG 75/25) (75-25) 100 UNIT/ML SUSP Inject 10 Units into the skin at bedtime.  10 mL  6  . INSULIN SYRINGE .5CC/29G 29G X 1/2" 0.5 ML MISC Uses directed  100 each  3  . Lancets (FREESTYLE) lancets Check your blood sugar daily  100 each  3  . metFORMIN (GLUCOPHAGE) 500 MG tablet Take 1 tablet (500 mg total) by mouth 2 (two) times daily with a meal.  200 tablet  3  . simvastatin (ZOCOR) 20 MG tablet Take 1 tablet (20 mg total) by mouth at bedtime.  100 tablet  3    Past Medical History  Diagnosis Date  . Allergy   . Diabetes mellitus   . Asthma   . Hyperlipidemia   . Hypertension   . Urinary tract disorder     outlet obstruction following surgery  . Mitral insufficiency     Past Surgical History  Procedure Date  . Inguinal hernia repair     left  . Elbow surgery   . Rotator cuff repair     ROS:  As stated in the HPI and negative for all other systems.  PHYSICAL EXAM BP 140/80  Pulse 77  Ht 5\' 8"  (1.727 m)  Wt 219 lb 9.6 oz (99.61 kg)  BMI 33.39 kg/m2 GENERAL:  Well  appearing NECK:  No jugular venous distention, waveform within normal limits, carotid upstroke brisk and symmetric, no bruits, no thyromegaly LYMPHATICS:  No cervical, inguinal adenopathy LUNGS:  Clear to auscultation bilaterally CHEST:  Unremarkable HEART:  PMI not displaced or sustained,S1 and S2 within normal limits, no S3, no S4, no clicks, no rubs, apical systolic murmur brief and slightly radiating to the apex. ABD:  Flat, positive bowel sounds normal in frequency in pitch, no bruits, no rebound, no guarding, no midline pulsatile mass, no hepatomegaly, no splenomegaly, umbilical hernia EXT:  2 plus pulses throughout, no edema, no cyanosis no clubbing   EKG: Sinus rhythm, rate 77, axis within normal limits, intervals within normal limits, no acute ST-T wave changes. 01/22/2012   ASSESSMENT AND PLAN  MITRAL REGURGITATION -  His mitral regurgitation is mild to moderate. This had increased perhaps slightly on the last echo. I cannot tell whether the murmur is more prominent but it is quite noticeable. Therefore, I will go ahead and check another echocardiogram to followup.  OVERWEIGHT -  I reviewed with him a graph of his weights.  He understands the  need to lose weight with diet and exercise.   HYPERTENSION - His BP is slightly elevated.  However, he says that this is unusual and other readings have not been this high.  He will continue with the medications as listed.

## 2012-01-24 ENCOUNTER — Other Ambulatory Visit (INDEPENDENT_AMBULATORY_CARE_PROVIDER_SITE_OTHER): Payer: Managed Care, Other (non HMO)

## 2012-01-24 DIAGNOSIS — E109 Type 1 diabetes mellitus without complications: Secondary | ICD-10-CM

## 2012-01-24 LAB — BASIC METABOLIC PANEL
BUN: 16 mg/dL (ref 6–23)
CO2: 30 mEq/L (ref 19–32)
Calcium: 9.3 mg/dL (ref 8.4–10.5)
Glucose, Bld: 141 mg/dL — ABNORMAL HIGH (ref 70–99)
Sodium: 142 mEq/L (ref 135–145)

## 2012-01-25 ENCOUNTER — Ambulatory Visit (HOSPITAL_COMMUNITY): Payer: Managed Care, Other (non HMO) | Attending: Internal Medicine | Admitting: Radiology

## 2012-01-25 DIAGNOSIS — E119 Type 2 diabetes mellitus without complications: Secondary | ICD-10-CM | POA: Insufficient documentation

## 2012-01-25 DIAGNOSIS — I517 Cardiomegaly: Secondary | ICD-10-CM | POA: Insufficient documentation

## 2012-01-25 DIAGNOSIS — R011 Cardiac murmur, unspecified: Secondary | ICD-10-CM | POA: Insufficient documentation

## 2012-01-25 DIAGNOSIS — I080 Rheumatic disorders of both mitral and aortic valves: Secondary | ICD-10-CM

## 2012-01-25 DIAGNOSIS — E785 Hyperlipidemia, unspecified: Secondary | ICD-10-CM | POA: Insufficient documentation

## 2012-01-25 DIAGNOSIS — I10 Essential (primary) hypertension: Secondary | ICD-10-CM | POA: Insufficient documentation

## 2012-01-25 DIAGNOSIS — I059 Rheumatic mitral valve disease, unspecified: Secondary | ICD-10-CM | POA: Insufficient documentation

## 2012-01-25 NOTE — Progress Notes (Signed)
Echocardiogram performed.  

## 2012-01-30 ENCOUNTER — Encounter: Payer: Self-pay | Admitting: Family Medicine

## 2012-01-30 ENCOUNTER — Telehealth: Payer: Self-pay | Admitting: Cardiology

## 2012-01-30 ENCOUNTER — Ambulatory Visit (INDEPENDENT_AMBULATORY_CARE_PROVIDER_SITE_OTHER): Payer: Managed Care, Other (non HMO) | Admitting: Family Medicine

## 2012-01-30 VITALS — BP 140/70 | Temp 98.6°F | Wt 221.0 lb

## 2012-01-30 DIAGNOSIS — E109 Type 1 diabetes mellitus without complications: Secondary | ICD-10-CM

## 2012-01-30 NOTE — Patient Instructions (Addendum)
Continue the current dose of metformin 500 mg twice daily  Increase the insulin to 15 units daily but take it with your evening meal  Check a blood sugar 3 times weekly  Return in 3 months for followup,,,,,,,,,,,,,,,,,, nonfasting labs one week prior

## 2012-01-30 NOTE — Progress Notes (Signed)
  Subjective:    Patient ID: David Fester., male    DOB: 16-Feb-1945, 66 y.o.   MRN: [id number]  HPI David Carr is a 66 year old male married nonsmoker who comes in today for followup of diabetes type 1  He takes metformin 500 mg twice a day,,,,,,,,,,,,,, and cannot take anymore because it gives him diarrhea,,,,,,,,,, and insulin Humalog 75-25 doses 10 units at bedtime. A1c up to 7.6 previous A1c was 6.5    Review of Systems Review of systems negative diet and exercise same weights the same    Objective:   Physical Exam  Well-developed well-nourished male no acute distress      Assessment & Plan:  Diabetes type 1 not at goal increase insulin to 15 units take it with p.m. meal since he is having episodes at night of low sugars followup in 3 months

## 2012-01-30 NOTE — Telephone Encounter (Signed)
Pt aware of results 

## 2012-01-30 NOTE — Telephone Encounter (Signed)
Pt would like echo results 

## 2012-04-18 ENCOUNTER — Ambulatory Visit (INDEPENDENT_AMBULATORY_CARE_PROVIDER_SITE_OTHER): Payer: Managed Care, Other (non HMO) | Admitting: Family Medicine

## 2012-04-18 ENCOUNTER — Encounter: Payer: Self-pay | Admitting: Family Medicine

## 2012-04-18 VITALS — BP 130/78 | HR 97 | Temp 98.5°F | Wt 218.0 lb

## 2012-04-18 DIAGNOSIS — H8110 Benign paroxysmal vertigo, unspecified ear: Secondary | ICD-10-CM

## 2012-04-18 MED ORDER — MECLIZINE HCL 12.5 MG PO TABS: 12.5000 mg | ORAL_TABLET | Freq: Three times a day (TID) | ORAL | Status: DC | PRN

## 2012-04-18 NOTE — Patient Instructions (Signed)
Benign Positional Vertigo  Vertigo means you feel like you or your surroundings are moving when they are not. Benign positional vertigo is the most common form of vertigo. Benign means that the cause of your condition is not serious. Benign positional vertigo is more common in older adults.  CAUSES   Benign positional vertigo is the result of an upset in the labyrinth system. This is an area in the middle ear that helps control your balance. This may be caused by a viral infection, head injury, or repetitive motion. However, often no specific cause is found.  SYMPTOMS   Symptoms of benign positional vertigo occur when you move your head or eyes in different directions. Some of the symptoms may include:  · Loss of balance and falls.  · Vomiting.  · Blurred vision.  · Dizziness.  · Nausea.  · Involuntary eye movements (nystagmus).  DIAGNOSIS   Benign positional vertigo is usually diagnosed by physical exam. If the specific cause of your benign positional vertigo is unknown, your caregiver may perform imaging tests, such as magnetic resonance imaging (MRI) or computed tomography (CT).  TREATMENT   Your caregiver may recommend movements or procedures to correct the benign positional vertigo. Medicines such as meclizine, benzodiazepines, and medicines for nausea may be used to treat your symptoms. In rare cases, if your symptoms are caused by certain conditions that affect the inner ear, you may need surgery.  HOME CARE INSTRUCTIONS   · Follow your caregiver's instructions.  · Move slowly. Do not make sudden body or head movements.  · Avoid driving.  · Avoid operating heavy machinery.  · Avoid performing any tasks that would be dangerous to you or others during a vertigo episode.  · Drink enough fluids to keep your urine clear or pale yellow.  SEEK IMMEDIATE MEDICAL CARE IF:   · You develop problems with walking, weakness, numbness, or using your arms, hands, or legs.  · You have difficulty speaking.  · You develop  severe headaches.  · Your nausea or vomiting continues or gets worse.  · You develop visual changes.  · Your family or friends notice any behavioral changes.  · Your condition gets worse.  · You have a fever.  · You develop a stiff neck or sensitivity to light.  MAKE SURE YOU:   · Understand these instructions.  · Will watch your condition.  · Will get help right away if you are not doing well or get worse.  Document Released: 11/07/2005 Document Revised: 04/24/2011 Document Reviewed: 10/20/2010  Exit[employer]® Patient Information ©2013 [employer].

## 2012-04-18 NOTE — Progress Notes (Signed)
Chief Complaint  Patient presents with  . Dizziness    x 2 or 3 weeks ago, nauseous when moving quickly     HPI:  Acute visit for vertigo: -with certain position of head has spinning sensation -started about a few weeks ago -has had this happen a few times in the past and thought it was ear wax and told was a rock in his ear before too - doesn't think he had vestibular rehab, thinks they did a medication - Meclizine -denies: SOB, CP, vomiting, vision changes, syncope, hearing problems, HA -blood sugars have been good  ROS: See pertinent positives and negatives per HPI.  Past Medical History  Diagnosis Date  . Allergy   . Diabetes mellitus   . Asthma   . Hyperlipidemia   . Hypertension   . Urinary tract disorder     outlet obstruction following surgery  . Mitral insufficiency     Family History  Problem Relation Age of Onset  . Asthma Sister     History   Social History  . Marital Status: Married    Spouse Name: N/A    Number of Children: N/A  . Years of Education: N/A   Social History Main Topics  . Smoking status: Never Smoker   . Smokeless tobacco: None  . Alcohol Use: No  . Drug Use: No  . Sexually Active:    Other Topics Concern  . None   Social History Narrative  . None    Current outpatient prescriptions:aspirin 81 MG tablet, Take 81 mg by mouth daily.  , Disp: , Rfl: ;  atenolol (TENORMIN) 25 MG tablet, One half tab p.o., Disp: 30 tablet, Rfl: 3;  Glucose Blood (FREESTYLE LITE TEST VI), by In Vitro route.  , Disp: , Rfl: ;  glucose blood test strip, 1 each by Other route daily., Disp: 100 each, Rfl: 3 insulin lispro protamine-insulin lispro (HUMALOG 75/25) (75-25) 100 UNIT/ML SUSP, Inject 10 Units into the skin at bedtime., Disp: 10 mL, Rfl: 6;  INSULIN SYRINGE .5CC/29G 29G X 1/2" 0.5 ML MISC, Uses directed, Disp: 100 each, Rfl: 3;  Lancets (FREESTYLE) lancets, Check your blood sugar daily, Disp: 100 each, Rfl: 3 metFORMIN (GLUCOPHAGE) 500 MG tablet,  Take 1 tablet (500 mg total) by mouth 2 (two) times daily with a meal., Disp: 200 tablet, Rfl: 3;  simvastatin (ZOCOR) 20 MG tablet, Take 1 tablet (20 mg total) by mouth at bedtime., Disp: 100 tablet, Rfl: 3;  meclizine (ANTIVERT) 12.5 MG tablet, Take 1 tablet (12.5 mg total) by mouth 3 (three) times daily as needed., Disp: 30 tablet, Rfl: 0  EXAM:  Filed Vitals:   04/18/12 1353  BP: 130/78  Pulse: 97  Temp:     Body mass index is 33.15 kg/(m^2).  GENERAL: vitals reviewed and listed above, alert, oriented, appears well hydrated and in no acute distress  HEENT: atraumatic, conjunttiva clear, no obvious abnormalities on inspection of external nose and ears, normal appearance of ear canals and TMs, no tonsillar edema or exudate, no sinus TTP  NECK: no obvious masses on inspection  LUNGS: clear to auscultation bilaterally, no wheezes, rales or rhonchi, good air movement  CV: HRRR, SEM, no peripheral edema  MS: moves all extremities without noticeable abnormality  PSYCH: pleasant and cooperative, no obvious depression or anxiety  NEURO: PERRLA, Hearing grossly intact, Dix hallpike + on L  ASSESSMENT AND PLAN:  Discussed the following assessment and plan:  BPPV (benign paroxysmal positional vertigo) - Plan: Ambulatory referral  to Physical Therapy, meclizine (ANTIVERT) 12.5 MG tablet  -ost likely BPPV per hx and exam findings, possible labrythitis, discussed etiology and implications -advised meclizine and vestibular rehab -will have him follow up in 1-2 months or sooner if concerns  -Patient advised to return or notify a doctor immediately if symptoms worsen or persist or new concerns arise.  There are no Patient Instructions on file for this visit.   Kriste Basque R.

## 2012-04-25 ENCOUNTER — Other Ambulatory Visit (INDEPENDENT_AMBULATORY_CARE_PROVIDER_SITE_OTHER): Payer: Managed Care, Other (non HMO)

## 2012-04-25 DIAGNOSIS — E109 Type 1 diabetes mellitus without complications: Secondary | ICD-10-CM

## 2012-04-25 LAB — BASIC METABOLIC PANEL
BUN: 13 mg/dL (ref 6–23)
Chloride: 103 mEq/L (ref 96–112)
Creatinine, Ser: 1.1 mg/dL (ref 0.4–1.5)
GFR: 74.5 mL/min (ref >60.00)
Potassium: 3.7 mEq/L (ref 3.5–5.1)

## 2012-04-25 LAB — HEMOGLOBIN A1C: Hgb A1c MFr Bld: 6.9 % — ABNORMAL HIGH (ref 4.6–6.5)

## 2012-04-29 ENCOUNTER — Ambulatory Visit: Payer: Managed Care, Other (non HMO) | Admitting: Family Medicine

## 2012-04-29 ENCOUNTER — Ambulatory Visit
Payer: Managed Care, Other (non HMO) | Attending: Family Medicine | Admitting: Rehabilitative and Restorative Service Providers"

## 2012-04-29 DIAGNOSIS — IMO0001 Reserved for inherently not codable concepts without codable children: Secondary | ICD-10-CM | POA: Insufficient documentation

## 2012-04-29 DIAGNOSIS — H8110 Benign paroxysmal vertigo, unspecified ear: Secondary | ICD-10-CM | POA: Insufficient documentation

## 2012-05-01 ENCOUNTER — Ambulatory Visit: Payer: Managed Care, Other (non HMO) | Admitting: Physical Therapy

## 2012-05-01 ENCOUNTER — Encounter: Payer: Self-pay | Admitting: *Deleted

## 2012-05-07 ENCOUNTER — Ambulatory Visit: Payer: Managed Care, Other (non HMO) | Admitting: Physical Therapy

## 2012-05-08 ENCOUNTER — Ambulatory Visit: Payer: Managed Care, Other (non HMO) | Admitting: Rehabilitative and Restorative Service Providers"

## 2012-05-20 ENCOUNTER — Ambulatory Visit: Payer: Managed Care, Other (non HMO) | Admitting: Rehabilitative and Restorative Service Providers"

## 2012-05-27 ENCOUNTER — Other Ambulatory Visit: Payer: Self-pay | Admitting: Family Medicine

## 2012-05-27 DIAGNOSIS — E109 Type 1 diabetes mellitus without complications: Secondary | ICD-10-CM

## 2012-05-28 ENCOUNTER — Ambulatory Visit: Payer: Managed Care, Other (non HMO) | Admitting: Family Medicine

## 2012-08-21 ENCOUNTER — Other Ambulatory Visit (INDEPENDENT_AMBULATORY_CARE_PROVIDER_SITE_OTHER): Payer: Managed Care, Other (non HMO)

## 2012-08-21 DIAGNOSIS — E109 Type 1 diabetes mellitus without complications: Secondary | ICD-10-CM

## 2012-08-21 LAB — BASIC METABOLIC PANEL
CO2: 31 mEq/L (ref 19–32)
Calcium: 9.2 mg/dL (ref 8.4–10.5)
Creatinine, Ser: 1.1 mg/dL (ref 0.4–1.5)
Sodium: 143 mEq/L (ref 135–145)

## 2012-08-21 LAB — HEMOGLOBIN A1C: Hgb A1c MFr Bld: 7.3 % — ABNORMAL HIGH (ref 4.6–6.5)

## 2012-08-27 ENCOUNTER — Encounter: Payer: Self-pay | Admitting: Family Medicine

## 2012-08-27 ENCOUNTER — Ambulatory Visit (INDEPENDENT_AMBULATORY_CARE_PROVIDER_SITE_OTHER): Payer: Managed Care, Other (non HMO) | Admitting: Family Medicine

## 2012-08-27 VITALS — BP 140/70 | Temp 98.8°F | Wt 215.0 lb

## 2012-08-27 DIAGNOSIS — I080 Rheumatic disorders of both mitral and aortic valves: Secondary | ICD-10-CM

## 2012-08-27 DIAGNOSIS — M25511 Pain in right shoulder: Secondary | ICD-10-CM

## 2012-08-27 DIAGNOSIS — E109 Type 1 diabetes mellitus without complications: Secondary | ICD-10-CM

## 2012-08-27 DIAGNOSIS — E663 Overweight: Secondary | ICD-10-CM

## 2012-08-27 DIAGNOSIS — M25519 Pain in unspecified shoulder: Secondary | ICD-10-CM

## 2012-08-27 DIAGNOSIS — J309 Allergic rhinitis, unspecified: Secondary | ICD-10-CM

## 2012-08-27 DIAGNOSIS — I10 Essential (primary) hypertension: Secondary | ICD-10-CM

## 2012-08-27 DIAGNOSIS — Z0000 Encounter for general adult medical examination without abnormal findings: Secondary | ICD-10-CM

## 2012-08-27 DIAGNOSIS — E785 Hyperlipidemia, unspecified: Secondary | ICD-10-CM

## 2012-08-27 MED ORDER — SIMVASTATIN 20 MG PO TABS: 20.0000 mg | ORAL_TABLET | Freq: Every day | ORAL | Status: DC

## 2012-08-27 MED ORDER — INSULIN LISPRO PROT & LISPRO (75-25 MIX) 100 UNIT/ML ~~LOC~~ SUSP: 15.0000 [IU] | Freq: Every day | SUBCUTANEOUS | Status: DC

## 2012-08-27 MED ORDER — "INSULIN SYRINGE 29G X 1/2"" 0.5 ML MISC": 1.0000 | Freq: Every day | Status: DC

## 2012-08-27 MED ORDER — FREESTYLE LANCETS MISC: Status: DC

## 2012-08-27 MED ORDER — GLUCOSE BLOOD VI STRP: 1.0000 | ORAL_STRIP | Freq: Every day | Status: DC

## 2012-08-27 MED ORDER — ATENOLOL 25 MG PO TABS: ORAL_TABLET | ORAL | Status: DC

## 2012-08-27 MED ORDER — METFORMIN HCL 500 MG PO TABS: 500.0000 mg | ORAL_TABLET | Freq: Two times a day (BID) | ORAL | Status: DC

## 2012-08-27 NOTE — Patient Instructions (Addendum)
Continue your current medications  Return sometime in the next week to get your fasting labs  We will call you the report and if we need to change in your medications then we will do it by phone   Since your blood sugar is under such good control I would check a blood sugar does Monday Wednesday Friday and return in 6 months for followup,,,,,,,, A1c nonfasting one week prior  For your shoulders I would recommend Motrin 400 mg twice daily and daily exercises so you don't get a frozen shoulder

## 2012-08-27 NOTE — Progress Notes (Signed)
Subjective:    Patient ID: David Carr., male    DOB: 11/05/1945, 67 y.o.   MRN: [id number]  HPI David Carr is a 67 year old married male nonsmoker who comes in today for a Medicare wellness examination because of a history of hypertension, diabetes type 1, hyperlipidemia, and slightly overweight  He says overall he feels well he gets 98 on his fasting sugars at home. A1c 6 days ago was 7.3%  History GI care, dental care, colonoscopy within 10 years normal. He had rotator cuff surgery on his right shoulder twice and he still has a lot of pain he's also having pain in his left shoulder. He declines to go back to see the orthopedist. He also is trigger finger on his right hand will be referred to Dr. Bertha Stakes. Hand surgeon  He also has a bump on his right foot and a lot of freckles and moles he would like checked.  Cognitive function normal he exercises on a regular basis home health safety reviewed no issues identified, no guns in the house, he does have a health care power of attorney and living well   Review of Systems  Constitutional: Negative.   HENT: Negative.   Eyes: Negative.   Respiratory: Negative.   Cardiovascular: Negative.   Gastrointestinal: Negative.   Genitourinary: Negative.   Musculoskeletal: Negative.   Skin: Negative.   Neurological: Negative.   Psychiatric/Behavioral: Negative.        Objective:   Physical Exam  Nursing note and vitals reviewed. Constitutional: He is oriented to person, place, and time. He appears well-developed and well-nourished.  HENT:  Head: Normocephalic and atraumatic.  Right Ear: External ear normal.  Left Ear: External ear normal.  Nose: Nose normal.  Mouth/Throat: Oropharynx is clear and moist.  Eyes: Conjunctivae and EOM are normal. Pupils are equal, round, and reactive to light.  Neck: Normal range of motion. Neck supple. No JVD present. No tracheal deviation present. No thyromegaly present.  Cardiovascular: Normal rate,  regular rhythm and intact distal pulses.  Exam reveals no gallop and no friction rub.   Murmur heard. No carotid or abdominal bruits peripheral pulses 2+ and symmetrical  Murmur of MR unchanged since previous exam. Cardiac evaluation in December which is also unchanged  Pulmonary/Chest: Effort normal and breath sounds normal. No stridor. No respiratory distress. He has no wheezes. He has no rales. He exhibits no tenderness.  Abdominal: Soft. Bowel sounds are normal. He exhibits no distension and no mass. There is no tenderness. There is no rebound and no guarding.  Genitourinary: Rectum normal, prostate normal and penis normal. Guaiac negative stool. No penile tenderness.  Musculoskeletal: Normal range of motion. He exhibits no edema and no tenderness.  Scar  right is rotator cuff surgery  Shoulder decreased range of motion consistent with another rotator cuff injury    Lymphadenopathy:    He has no cervical adenopathy.  Neurological: He is alert and oriented to person, place, and time. He has normal reflexes. No cranial nerve deficit. He exhibits normal muscle tone.  The neuropathy  Skin: Skin is warm and dry. No rash noted. No erythema. No pallor.  Total body skin exam normal  Psychiatric: He has a normal mood and affect. His behavior is normal. Judgment and thought content normal.          Assessment & Plan:  Healthy male  Type 1 diabetes continue current treatment plan exercise a little bit more to get she A1c below 7 followup in 6  months  Hypertension at goal continue current therapy  Hyperlipidemia goal continue current therapy  Pain right shoulder consistent with recurrent rotator cuff injury now pain left shoulder consistent with rotator cuff injury refer back to orthopedist  Trigger finger refer to Dr. Bertha Stakes.

## 2012-08-30 ENCOUNTER — Other Ambulatory Visit: Payer: Self-pay | Admitting: *Deleted

## 2012-08-30 DIAGNOSIS — I10 Essential (primary) hypertension: Secondary | ICD-10-CM

## 2012-08-30 DIAGNOSIS — E785 Hyperlipidemia, unspecified: Secondary | ICD-10-CM

## 2012-08-30 MED ORDER — "INSULIN SYRINGE-NEEDLE U-100 30G X 1/2"" 0.5 ML MISC": 1.0000 | Freq: Every day | Status: DC

## 2012-08-30 MED ORDER — ATENOLOL 25 MG PO TABS: ORAL_TABLET | ORAL | Status: DC

## 2012-09-05 ENCOUNTER — Other Ambulatory Visit (INDEPENDENT_AMBULATORY_CARE_PROVIDER_SITE_OTHER): Payer: Managed Care, Other (non HMO)

## 2012-09-05 DIAGNOSIS — I10 Essential (primary) hypertension: Secondary | ICD-10-CM

## 2012-09-05 DIAGNOSIS — Z0000 Encounter for general adult medical examination without abnormal findings: Secondary | ICD-10-CM

## 2012-09-05 DIAGNOSIS — E785 Hyperlipidemia, unspecified: Secondary | ICD-10-CM

## 2012-09-05 DIAGNOSIS — E109 Type 1 diabetes mellitus without complications: Secondary | ICD-10-CM

## 2012-09-05 LAB — PSA: PSA: 1.3 ng/mL (ref 0.10–4.00)

## 2012-09-05 LAB — MICROALBUMIN / CREATININE URINE RATIO
Creatinine,U: 195.5 mg/dL
Microalb Creat Ratio: 0.4 mg/g (ref 0.0–30.0)
Microalb, Ur: 0.8 mg/dL (ref 0.0–1.9)

## 2012-09-05 LAB — CBC WITH DIFFERENTIAL/PLATELET
Basophils Absolute: 0 10*3/uL (ref 0.0–0.1)
Eosinophils Absolute: 0 10*3/uL (ref 0.0–0.7)
Hemoglobin: 16.6 g/dL (ref 13.0–17.0)
Lymphocytes Relative: 25.3 % (ref 12.0–46.0)
MCHC: 33.9 g/dL (ref 30.0–36.0)
Monocytes Relative: 5.8 % (ref 3.0–12.0)
Neutrophils Relative %: 68.1 % (ref 43.0–77.0)
Platelets: 187 10*3/uL (ref 150.0–400.0)
RDW: 14.3 % (ref 11.5–14.6)

## 2012-09-05 LAB — LIPID PANEL
Cholesterol: 125 mg/dL (ref 0–200)
HDL: 39.8 mg/dL (ref >39.00)
Total CHOL/HDL Ratio: 3
Triglycerides: 87 mg/dL (ref 0.0–149.0)

## 2012-09-05 LAB — BASIC METABOLIC PANEL
BUN: 19 mg/dL (ref 6–23)
Calcium: 9.6 mg/dL (ref 8.4–10.5)
Creatinine, Ser: 1 mg/dL (ref 0.4–1.5)
GFR: 75.25 mL/min (ref >60.00)
Glucose, Bld: 146 mg/dL — ABNORMAL HIGH (ref 70–99)
Potassium: 4.6 mEq/L (ref 3.5–5.1)

## 2012-09-05 LAB — POCT URINALYSIS DIPSTICK
Bilirubin, UA: NEGATIVE
Blood, UA: NEGATIVE
Nitrite, UA: NEGATIVE
Protein, UA: NEGATIVE
pH, UA: 5.5

## 2012-09-05 LAB — HEPATIC FUNCTION PANEL
AST: 22 U/L (ref 0–37)
Albumin: 4.1 g/dL (ref 3.5–5.2)
Alkaline Phosphatase: 63 U/L (ref 39–117)
Total Protein: 6.8 g/dL (ref 6.0–8.3)

## 2012-09-05 LAB — TSH: TSH: 3.1 u[IU]/mL (ref 0.35–5.50)

## 2012-09-06 ENCOUNTER — Encounter: Payer: Self-pay | Admitting: *Deleted

## 2012-10-28 ENCOUNTER — Encounter: Payer: Self-pay | Admitting: Internal Medicine

## 2012-10-28 ENCOUNTER — Ambulatory Visit (INDEPENDENT_AMBULATORY_CARE_PROVIDER_SITE_OTHER): Payer: Managed Care, Other (non HMO) | Admitting: Internal Medicine

## 2012-10-28 VITALS — BP 150/80 | HR 76 | Temp 97.9°F | Resp 20 | Wt 212.0 lb

## 2012-10-28 DIAGNOSIS — M778 Other enthesopathies, not elsewhere classified: Secondary | ICD-10-CM

## 2012-10-28 DIAGNOSIS — Z23 Encounter for immunization: Secondary | ICD-10-CM

## 2012-10-28 DIAGNOSIS — E119 Type 2 diabetes mellitus without complications: Secondary | ICD-10-CM

## 2012-10-28 DIAGNOSIS — M65839 Other synovitis and tenosynovitis, unspecified forearm: Secondary | ICD-10-CM

## 2012-10-28 DIAGNOSIS — I10 Essential (primary) hypertension: Secondary | ICD-10-CM

## 2012-10-28 DIAGNOSIS — N23 Unspecified renal colic: Secondary | ICD-10-CM

## 2012-10-28 DIAGNOSIS — E109 Type 1 diabetes mellitus without complications: Secondary | ICD-10-CM

## 2012-10-28 LAB — BASIC METABOLIC PANEL
BUN: 15 mg/dL (ref 6–23)
CO2: 26 mEq/L (ref 19–32)
Chloride: 105 mEq/L (ref 96–112)
GFR: 76.92 mL/min (ref >60.00)
Glucose, Bld: 152 mg/dL — ABNORMAL HIGH (ref 70–99)
Potassium: 4.3 mEq/L (ref 3.5–5.1)
Sodium: 138 mEq/L (ref 135–145)

## 2012-10-28 NOTE — Progress Notes (Signed)
Subjective:    Patient ID: David Carr., male    DOB: 03/22/1945, 67 y.o.   MRN: [id number]  HPI  67 year old patient who presents with a two-day history of left flank discomfort and intermittent dark urine over the past 2 days. He states his urine has cleared today. He does have a history of renal stone disease that has been treated with lithotripsy in the past. At the present time he is pain-free and states that his urine has cleared. He is unable to give a urine specimen today however He also gives a 1-1/2 week history of right wrist pain. He has been using a brace without benefit.  Past Medical History  Diagnosis Date  . Allergy   . Diabetes mellitus   . Asthma   . Hyperlipidemia   . Hypertension   . Urinary tract disorder     outlet obstruction following surgery  . Mitral insufficiency     History   Social History  . Marital Status: Married    Spouse Name: N/A    Number of Children: N/A  . Years of Education: N/A   Occupational History  . Not on file.   Social History Main Topics  . Smoking status: Never Smoker   . Smokeless tobacco: Not on file  . Alcohol Use: No  . Drug Use: No  . Sexual Activity:    Other Topics Concern  . Not on file   Social History Narrative  . No narrative on file    Past Surgical History  Procedure Laterality Date  . Inguinal hernia repair      left  . Elbow surgery    . Rotator cuff repair      Family History  Problem Relation Age of Onset  . Asthma Sister     Allergies  Allergen Reactions  . Morphine And Related     Shuts bladder down.    Current Outpatient Prescriptions on File Prior to Visit  Medication Sig Dispense Refill  . aspirin 81 MG tablet Take 81 mg by mouth daily.        Marland Kitchen atenolol (TENORMIN) 25 MG tablet Half tab daliy  90 tablet  3  . Glucose Blood (FREESTYLE LITE TEST VI) by In Vitro route.        Marland Kitchen glucose blood test strip 1 each by Other route daily.  100 each  3  . insulin lispro  protamine-lispro (HUMALOG 75/25) (75-25) 100 UNIT/ML SUSP Inject 15 Units into the skin at bedtime.  10 mL  11  . Insulin Syringe-Needle U-100 (B-D INS SYR ULTRAFINE .5CC/30G) 30G X 1/2" 0.5 ML MISC 1 each by Does not apply route daily.  100 each  3  . Lancets (FREESTYLE) lancets Check your blood sugar daily  100 each  3  . metFORMIN (GLUCOPHAGE) 500 MG tablet Take 1 tablet (500 mg total) by mouth 2 (two) times daily with a meal.  200 tablet  3  . simvastatin (ZOCOR) 20 MG tablet Take 1 tablet (20 mg total) by mouth at bedtime.  100 tablet  3   No current facility-administered medications on file prior to visit.    BP 150/80  Pulse 76  Temp(Src) 97.9 F (36.6 C) (Oral)  Resp 20  Wt 212 lb (96.163 kg)  BMI 32.24 kg/m2  SpO2 96%       Review of Systems  Constitutional: Negative for fever, chills, appetite change and fatigue.  HENT: Negative for hearing loss, ear pain, congestion, sore throat,  trouble swallowing, neck stiffness, dental problem, voice change and tinnitus.   Eyes: Negative for pain, discharge and visual disturbance.  Respiratory: Negative for cough, chest tightness, wheezing and stridor.   Cardiovascular: Negative for chest pain, palpitations and leg swelling.  Gastrointestinal: Positive for abdominal pain. Negative for nausea, vomiting, diarrhea, constipation, blood in stool and abdominal distention.  Genitourinary: Positive for hematuria and flank pain. Negative for urgency, discharge, difficulty urinating and genital sores.  Musculoskeletal: Negative for myalgias, back pain, joint swelling, arthralgias and gait problem.  Skin: Negative for rash.  Neurological: Negative for dizziness, syncope, speech difficulty, weakness, numbness and headaches.  Hematological: Negative for adenopathy. Does not bruise/bleed easily.  Psychiatric/Behavioral: Negative for behavioral problems and dysphoric mood. The patient is not nervous/anxious.        Objective:   Physical Exam   Constitutional: He is oriented to person, place, and time. He appears well-developed and well-nourished. No distress.  HENT:  Head: Normocephalic.  Right Ear: External ear normal.  Left Ear: External ear normal.  Eyes: Conjunctivae and EOM are normal.  Neck: Normal range of motion.  Cardiovascular: Normal rate and normal heart sounds.   Pulmonary/Chest: Breath sounds normal.  Abdominal: Bowel sounds are normal. There is no tenderness. There is no rebound.  Musculoskeletal: Normal range of motion. He exhibits no edema and no tenderness.  Tenderness over the right ulnar collateral ligament  Neurological: He is alert and oriented to person, place, and time.  Psychiatric: He has a normal mood and affect. His behavior is normal.          Assessment & Plan:   Mild renal colic with hematuria improved. We'll check a abdominal ultrasound to rule out obstruction which is unlikely since he is pain-free. We'll set up for urological evaluation if symptoms recur. We'll force fluids. Will check a bmet  Right wrist tendinitis/procedure note.  After consent and Betadine and alcohol prep, 1 cc(40 mg) of Depo-Medrol and 2 cc of lidocaine without epinephrine injected in the subcutaneous tissues  Of the right lateral wrist  just distal to the ulna. Patient tolerated procedure well. We'll continue to use the wrist brace

## 2012-10-28 NOTE — Patient Instructions (Signed)
Ureteral Colic (Kidney Stones)  Ureteral colic is the result of a condition when kidney stones form inside the kidney. Once kidney stones are formed they may move into the tube that connects the kidney with the bladder (ureter). If this occurs, this condition may cause pain (colic) in the ureter.   CAUSES   Pain is caused by stone movement in the ureter and the obstruction caused by the stone.  SYMPTOMS   The pain comes and goes as the ureter contracts around the stone. The pain is usually intense, sharp, and stabbing in character. The location of the pain may move as the stone moves through the ureter. When the stone is near the kidney the pain is usually located in the back and radiates to the belly (abdomen). When the stone is ready to pass into the bladder the pain is often located in the lower abdomen on the side the stone is located. At this location, the symptoms may mimic those of a urinary tract infection with urinary frequency. Once the stone is located here it often passes into the bladder and the pain disappears completely.  TREATMENT    Your caregiver will provide you with medicine for pain relief.   You may require specialized follow-up X-rays.   The absence of pain does not always mean that the stone has passed. It may have just stopped moving. If the urine remains completely obstructed, it can cause loss of kidney function or even complete destruction of the involved kidney. It is your responsibility and in your interest that X-rays and follow-ups as suggested by your caregiver are completed. Relief of pain without passage of the stone can be associated with severe damage to the kidney, including loss of kidney function on that side.   If your stone does not pass on its own, additional measures may be taken by your caregiver to ensure its removal.  HOME CARE INSTRUCTIONS    Increase your fluid intake. Water is the preferred fluid since juices containing vitamin C may acidify the urine making it  less likely for certain stones (uric acid stones) to pass.   Strain all urine. A strainer will be provided. Keep all particulate matter or stones for your caregiver to inspect.   Take your pain medicine as directed.   Make a follow-up appointment with your caregiver as directed.   Remember that the goal is passage of your stone. The absence of pain does not mean the stone is gone. Follow your caregiver's instructions.   Only take over-the-counter or prescription medicines for pain, discomfort, or fever as directed by your caregiver.  SEEK MEDICAL CARE IF:    Pain cannot be controlled with the prescribed medicine.   You have a fever.   Pain continues for longer than your caregiver advises it should.   There is a change in the pain, and you develop chest discomfort or constant abdominal pain.   You feel faint or pass out.  MAKE SURE YOU:    Understand these instructions.   Will watch your condition.   Will get help right away if you are not doing well or get worse.  Document Released: 11/09/2004 Document Revised: 04/24/2011 Document Reviewed: 07/27/2010  [employer] Patient Information 2014 [employer].

## 2012-10-29 ENCOUNTER — Telehealth: Payer: Self-pay | Admitting: Family Medicine

## 2012-10-29 DIAGNOSIS — N200 Calculus of kidney: Secondary | ICD-10-CM

## 2012-10-29 NOTE — Telephone Encounter (Signed)
Please attempt to get patient into see urology as soon as possible

## 2012-10-29 NOTE — Telephone Encounter (Signed)
Pt called and stated that he is scheduled for an abdominal ultrasound for Friday morning, in order to determine wether he has passed his kidney stone or not. He states that he is now in pain, and is assuming that the stone has shifted to cause this pain. Therefore, he believes that he has not passed it, and is inquiring to see if the ultrasound is still necessary. Please advise.

## 2012-10-30 ENCOUNTER — Emergency Department (HOSPITAL_COMMUNITY)
Admission: EM | Admit: 2012-10-30 | Discharge: 2012-10-30 | Disposition: A | Discharge status: Home / Self Care | Payer: Managed Care, Other (non HMO) | Attending: Emergency Medicine | Admitting: Emergency Medicine

## 2012-10-30 ENCOUNTER — Encounter (HOSPITAL_COMMUNITY): Payer: Self-pay | Admitting: Emergency Medicine

## 2012-10-30 DIAGNOSIS — Z7982 Long term (current) use of aspirin: Secondary | ICD-10-CM | POA: Insufficient documentation

## 2012-10-30 DIAGNOSIS — E119 Type 2 diabetes mellitus without complications: Secondary | ICD-10-CM | POA: Insufficient documentation

## 2012-10-30 DIAGNOSIS — E785 Hyperlipidemia, unspecified: Secondary | ICD-10-CM | POA: Insufficient documentation

## 2012-10-30 DIAGNOSIS — Z79899 Other long term (current) drug therapy: Secondary | ICD-10-CM | POA: Insufficient documentation

## 2012-10-30 DIAGNOSIS — I10 Essential (primary) hypertension: Secondary | ICD-10-CM | POA: Insufficient documentation

## 2012-10-30 DIAGNOSIS — Z8679 Personal history of other diseases of the circulatory system: Secondary | ICD-10-CM | POA: Insufficient documentation

## 2012-10-30 DIAGNOSIS — J45909 Unspecified asthma, uncomplicated: Secondary | ICD-10-CM | POA: Insufficient documentation

## 2012-10-30 DIAGNOSIS — N23 Unspecified renal colic: Secondary | ICD-10-CM | POA: Insufficient documentation

## 2012-10-30 DIAGNOSIS — Z794 Long term (current) use of insulin: Secondary | ICD-10-CM | POA: Insufficient documentation

## 2012-10-30 DIAGNOSIS — Z87440 Personal history of urinary (tract) infections: Secondary | ICD-10-CM | POA: Insufficient documentation

## 2012-10-30 MED ORDER — KETOROLAC TROMETHAMINE 60 MG/2ML IM SOLN
60.0000 mg | Freq: Once | INTRAMUSCULAR | Status: AC
  Administered 2012-10-30: 60 mg via INTRAMUSCULAR
  Filled 2012-10-30: qty 2

## 2012-10-30 NOTE — ED Provider Notes (Signed)
CSN: [id number]     Arrival date & time 10/30/12  2047 History   First MD Initiated Contact with Patient 10/30/12 2112     Chief Complaint  Patient presents with  . Flank Pain   (Consider location/radiation/quality/duration/timing/severity/associated sxs/prior Treatment) HPI David Carr. is a 67 y.o. male presents to emergency department complaining of left flank pain. Patient states that he was already seen by his primary care Dr. and by a urologist today and had a CT scan a urinalysis done. Patient states that he was told that his urine was okay and that his kidney stone is very small and in distal ureter and will he was most likely going to pass it. Patient was given prescription for pain medication and was told to return to emergency Department if his pain is not managed at home. Patient states that he did take 2 Vicodin as today and states that his pain fluctuated up to a 7 but states it's at baseline around 3-5. Patient states that he normally has very good pain relief with Toradol so he decided to come to emergency department for Toradol shot. Patient states that he does have some burning when he urinates, states that his urine is bloody. Patient denies any fever, chills, malaise. Patient denies abdominal pain. Patient denies any difficulty urinating. Patient states she did take the Flomax with dinner as well. Patient reports no other complaints. Past Medical History  Diagnosis Date  . Allergy   . Diabetes mellitus   . Asthma   . Hyperlipidemia   . Hypertension   . Urinary tract disorder     outlet obstruction following surgery  . Mitral insufficiency    Past Surgical History  Procedure Laterality Date  . Inguinal hernia repair      left  . Elbow surgery    . Rotator cuff repair     Family History  Problem Relation Age of Onset  . Asthma Sister    History  Substance Use Topics  . Smoking status: Never Smoker   . Smokeless tobacco: Never Used  . Alcohol Use: No     Review of Systems  Constitutional: Negative for fever and chills.  HENT: Negative for neck pain and neck stiffness.   Respiratory: Negative for cough, chest tightness and shortness of breath.   Cardiovascular: Negative for chest pain, palpitations and leg swelling.  Gastrointestinal: Positive for abdominal pain. Negative for nausea, vomiting, diarrhea and abdominal distention.  Genitourinary: Positive for dysuria, hematuria and flank pain. Negative for urgency, frequency, decreased urine volume, penile swelling, scrotal swelling, difficulty urinating, penile pain and testicular pain.  Musculoskeletal: Negative for myalgias and arthralgias.  Skin: Negative for rash.  Allergic/Immunologic: Negative for immunocompromised state.  Neurological: Negative for dizziness, weakness, light-headedness, numbness and headaches.    Allergies  Morphine and related  Home Medications   Current Outpatient Rx  Name  Route  Sig  Dispense  Refill  . aspirin 81 MG tablet   Oral   Take 81 mg by mouth daily.           Marland Kitchen atenolol (TENORMIN) 25 MG tablet   Oral   Take 12.5 mg by mouth daily.         Marland Kitchen HYDROcodone-acetaminophen (NORCO/VICODIN) 5-325 MG per tablet   Oral   Take 1 tablet by mouth every 6 (six) hours as needed for pain.         Marland Kitchen insulin lispro protamine-lispro (HUMALOG 75/25) (75-25) 100 UNIT/ML SUSP   Subcutaneous  Inject 15 Units into the skin at bedtime.   10 mL   11   . metFORMIN (GLUCOPHAGE) 500 MG tablet   Oral   Take 1 tablet (500 mg total) by mouth 2 (two) times daily with a meal.   200 tablet   3   . simvastatin (ZOCOR) 20 MG tablet   Oral   Take 1 tablet (20 mg total) by mouth at bedtime.   100 tablet   3   . Omega-3 Fatty Acids (FISH OIL) 1000 MG CAPS   Oral   Take 2,000 mg by mouth daily.          BP 177/81  Pulse 92  Temp(Src) 98.6 F (37 C) (Oral)  Resp 20  SpO2 95% Physical Exam  Nursing note and vitals reviewed. Constitutional: He  appears well-developed and well-nourished. No distress.  HENT:  Head: Normocephalic and atraumatic.  Eyes: Conjunctivae are normal.  Neck: Neck supple.  Cardiovascular: Normal rate, regular rhythm and normal heart sounds.   Pulmonary/Chest: Effort normal. No respiratory distress. He has no wheezes. He has no rales.  Abdominal: Soft. Bowel sounds are normal. He exhibits no distension. There is tenderness. There is no rebound and no guarding.  Left lower quadrant tenderness. Left CVA tenderness.  Musculoskeletal: He exhibits no edema.  Neurological: He is alert.  Skin: Skin is warm and dry.    ED Course  Procedures (including critical care time) Labs Review Labs Reviewed - No data to display Imaging Review No results found.  MDM   1. Renal colic on left side     Patient here with renal colic. He had a CT scan and UA done this morning at a urologist office. I do not have access to those results however patient states that his UA did not show infection and that his kidney stone was small and was in the UVJ. Patient has had multiple kidney stones in the past. States that the only reason he came in is to get a shot of Toradol because he was not having a good relief of pain at home with his pain medications. Patient did not want to have any lab work or UA done. Patient did have blood work done 2 days ago which was normal. did give him a shot of portal emergency Department.patient will be discharged home with close follow up with a urology or primary care doctor.  Filed Vitals:   10/30/12 2056  BP: 177/81  Pulse: 92  Temp: 98.6 F (37 C)  TempSrc: Oral  Resp: 20  SpO2: 95%      David Carr A Aldair Rickel, PA-C 10/30/12 2349

## 2012-10-30 NOTE — ED Notes (Addendum)
Pt reports being seen earlier today by Dr. Sherron Monday for flank pain. Pt was diagnosed with kidney stones and was provided pain medication. Pt reports that he was instructed by Dr. Sherron Monday that the stones were close to being passed and was instructed to report to the ED for pain that was not relieved by his pain medication. Pt states he has attempted to use his pain medication, however currently it is not relieving his pain. Pt reports hematuria while voiding.

## 2012-10-30 NOTE — Telephone Encounter (Signed)
Referral request placed and patient is aware 

## 2012-11-01 ENCOUNTER — Other Ambulatory Visit: Payer: Managed Care, Other (non HMO)

## 2012-11-05 NOTE — ED Provider Notes (Signed)
Medical screening examination/treatment/procedure(s) were performed by non-physician practitioner and as supervising physician I was immediately available for consultation/collaboration.   Braydee Shimkus R Serina Nichter, MD 11/05/12 1550 

## 2013-01-28 ENCOUNTER — Encounter: Payer: Self-pay | Admitting: Cardiology

## 2013-01-28 ENCOUNTER — Ambulatory Visit (INDEPENDENT_AMBULATORY_CARE_PROVIDER_SITE_OTHER): Payer: Managed Care, Other (non HMO) | Admitting: Cardiology

## 2013-01-28 VITALS — BP 136/80 | HR 84 | Ht 68.0 in | Wt 213.0 lb

## 2013-01-28 DIAGNOSIS — I10 Essential (primary) hypertension: Secondary | ICD-10-CM

## 2013-01-28 DIAGNOSIS — I080 Rheumatic disorders of both mitral and aortic valves: Secondary | ICD-10-CM

## 2013-01-28 NOTE — Progress Notes (Signed)
   HPI The patient presents for followup of his mitral regurgitation. It has been one year since his last evaluation.  He has mild mitral regurgitation and a well preserved ejection fraction and this has been stable. The patient denies any new symptoms such as chest discomfort, neck or arm discomfort. There has been no new shortness of breath, PND or orthopnea. There have been no reported palpitations, presyncope or syncope.  He walks his dog frequently for exercise and does yard work.  Allergies  Allergen Reactions  . Morphine And Related     Shuts bladder down.    Current Outpatient Prescriptions  Medication Sig Dispense Refill  . aspirin 81 MG tablet Take 81 mg by mouth daily.        Marland Kitchen atenolol (TENORMIN) 25 MG tablet Take 12.5 mg by mouth daily.      . insulin lispro protamine-lispro (HUMALOG 75/25) (75-25) 100 UNIT/ML SUSP Inject 15 Units into the skin at bedtime.  10 mL  11  . metFORMIN (GLUCOPHAGE) 500 MG tablet Take 1 tablet (500 mg total) by mouth 2 (two) times daily with a meal.  200 tablet  3  . Omega-3 Fatty Acids (FISH OIL) 1000 MG CAPS Take 2,000 mg by mouth daily.      . simvastatin (ZOCOR) 20 MG tablet Take 1 tablet (20 mg total) by mouth at bedtime.  100 tablet  3  . SPRIX 15.75 MG/SPRAY SOLN For kidney stones       No current facility-administered medications for this visit.    Past Medical History  Diagnosis Date  . Allergy   . Diabetes mellitus   . Asthma   . Hyperlipidemia   . Hypertension   . Urinary tract disorder     outlet obstruction following surgery  . Mitral insufficiency     Past Surgical History  Procedure Laterality Date  . Inguinal hernia repair      left  . Elbow surgery    . Rotator cuff repair      ROS:  As stated in the HPI and negative for all other systems.  PHYSICAL EXAM BP 136/80  Pulse 84  Ht 5\' 8"  (1.727 m)  Wt 213 lb (96.616 kg)  BMI 32.39 kg/m2 GENERAL:  Well appearing NECK:  No jugular venous distention, waveform within  normal limits, carotid upstroke brisk and symmetric, no bruits, no thyromegaly LYMPHATICS:  No cervical, inguinal adenopathy LUNGS:  Clear to auscultation bilaterally CHEST:  Unremarkable HEART:  PMI not displaced or sustained,S1 and S2 within normal limits, no S3, no S4, no clicks, no rubs, apical systolic murmur brief and slightly radiating to the apex. ABD:  Flat, positive bowel sounds normal in frequency in pitch, no bruits, no rebound, no guarding, no midline pulsatile mass, no hepatomegaly, no splenomegaly, umbilical hernia EXT:  2 plus pulses throughout, no edema, no cyanosis no clubbing   EKG: Sinus rhythm, rate 84, axis within normal limits, intervals within normal limits, no acute ST-T wave changes. PACs.  01/28/2013   ASSESSMENT AND PLAN  MITRAL REGURGITATION -  He has no new complaints and no change in exam.  No further imaging this year is needed.  I will however check an echo last year.   OVERWEIGHT -  We have talked about this over the years.   HYPERTENSION - The blood pressure is at target. No change in medications is indicated. We will continue with therapeutic lifestyle changes (TLC).

## 2013-01-28 NOTE — Patient Instructions (Signed)
The current medical regimen is effective;  continue present plan and medications.  Your physician has requested that you have an echocardiogram in 1 year. Echocardiography is a painless test that uses sound waves to create images of your heart. It provides your doctor with information about the size and shape of your heart and how well your heart's chambers and valves are working. This procedure takes approximately one hour. There are no restrictions for this procedure.  Follow up in 1 year with Dr Hochrein.  You will receive a letter in the mail 2 months before you are due.  Please call us when you receive this letter to schedule your follow up appointment.  

## 2013-01-30 ENCOUNTER — Ambulatory Visit: Payer: Managed Care, Other (non HMO) | Admitting: Family Medicine

## 2013-02-20 ENCOUNTER — Other Ambulatory Visit (INDEPENDENT_AMBULATORY_CARE_PROVIDER_SITE_OTHER): Payer: Managed Care, Other (non HMO)

## 2013-02-20 DIAGNOSIS — E1165 Type 2 diabetes mellitus with hyperglycemia: Principal | ICD-10-CM

## 2013-02-20 DIAGNOSIS — IMO0001 Reserved for inherently not codable concepts without codable children: Secondary | ICD-10-CM

## 2013-02-20 LAB — BASIC METABOLIC PANEL
BUN: 14 mg/dL (ref 6–23)
CALCIUM: 9.2 mg/dL (ref 8.4–10.5)
CO2: 28 mEq/L (ref 19–32)
Chloride: 104 mEq/L (ref 96–112)
Creatinine, Ser: 1 mg/dL (ref 0.4–1.5)
GFR: 79.55 mL/min (ref >60.00)
Glucose, Bld: 129 mg/dL — ABNORMAL HIGH (ref 70–99)
Potassium: 3.6 mEq/L (ref 3.5–5.1)
SODIUM: 140 meq/L (ref 135–145)

## 2013-02-20 LAB — HEMOGLOBIN A1C: HEMOGLOBIN A1C: 6.9 % — AB (ref 4.6–6.5)

## 2013-02-27 ENCOUNTER — Encounter: Payer: Self-pay | Admitting: Family Medicine

## 2013-02-27 ENCOUNTER — Ambulatory Visit (INDEPENDENT_AMBULATORY_CARE_PROVIDER_SITE_OTHER): Payer: Managed Care, Other (non HMO) | Admitting: Family Medicine

## 2013-02-27 VITALS — BP 150/80 | Temp 98.3°F | Wt 213.0 lb

## 2013-02-27 DIAGNOSIS — E119 Type 2 diabetes mellitus without complications: Secondary | ICD-10-CM

## 2013-02-27 NOTE — Progress Notes (Signed)
Pre visit review using our clinic review tool, if applicable. No additional management support is needed unless otherwise documented below in the visit note. 

## 2013-02-27 NOTE — Patient Instructions (Signed)
Continue your current medications diet and exercise  Followup the fourth week in July for your annual exam  Labs one week prior

## 2013-02-27 NOTE — Progress Notes (Signed)
   Subjective:    Patient ID: David Shadow., male    DOB: [birth year], 68 y.o.   MRN: [id number]  HPI David Carr is a 68 year old male who comes in today for followup of diabetes type 2  He was on metformin 500 mg twice a day that his blood sugar went up. He's currently taking the metformin but we added insulin 15 units at bedtime. No blood sugars 129 and hemoglobin A1c 6.9%. No hypoglycemia  BP at home 130/80 or less  We discussed his mitral valve prolapse. He is clinically stable echo yearly unchanged   Review of Systems Review of systems negative    Objective:   Physical Exam Well-developed well-nourished male no acute distress vital signs stable he is afebrile       Assessment & Plan:  Diabetes type 2 at goal continue current therapy followup in July for annual Medicare wellness exam

## 2013-02-28 ENCOUNTER — Telehealth: Payer: Self-pay

## 2013-02-28 NOTE — Telephone Encounter (Signed)
Relevant patient education mailed to patient.  

## 2013-07-08 ENCOUNTER — Encounter: Payer: Self-pay | Admitting: Family Medicine

## 2013-07-08 ENCOUNTER — Ambulatory Visit (INDEPENDENT_AMBULATORY_CARE_PROVIDER_SITE_OTHER): Payer: Managed Care, Other (non HMO) | Admitting: Family Medicine

## 2013-07-08 VITALS — BP 130/90 | HR 90 | Temp 98.6°F | Wt 210.0 lb

## 2013-07-08 DIAGNOSIS — I499 Cardiac arrhythmia, unspecified: Secondary | ICD-10-CM

## 2013-07-08 DIAGNOSIS — I080 Rheumatic disorders of both mitral and aortic valves: Secondary | ICD-10-CM

## 2013-07-08 NOTE — Patient Instructions (Addendum)
No exertion  Dr. [facility] will call you...........Marland Kitchen prior to your appointment with him if you develop more shortness of breath or chest pain come directly to the emergency room

## 2013-07-08 NOTE — Progress Notes (Signed)
   Subjective:    Patient ID: David Carr., male    DOB: [birth year], 68 y.o.   MRN: [id number]  HPI Is a 68 year old married male nonsmoker who comes in today for evaluation of shortness of breath  He's got a history of mitral insufficiency has been followed by me and Dr. Deeann Dowse. He did well until last Thursday when you notice normal exercise cause shortness of breath. For example he told me that if he would walk for a mile he used to do fine now quarter mile he starts to feel short of breath and he has a pressure sensation in his chest. He does not have to stop. The chest pain does not radiate and he has no other cardiac or pulmonary symptoms.  Blood sugar blood pressure normal   Review of Systems Review of systems otherwise negative    Objective:   Physical Exam Well-developed and nourished male no acute distress vital signs stable he is afebrile BP 130/80 pulse was 70 with occasional skipped EKG shows nodal beat otherwise within normal limits  Cardiopulmonary exam normal except murmur of mitral insufficiency       Assessment & Plan:  Mitral insufficiency now with a five-day history of shortness of breath and chest pain,,,,,,,, call Dr. Genella Mech and he will see him ASAP.............. his phone numbers not in the computer and he declines to give me his phone number. He said he had trouble with the appointment reminder system and refuses to give [place] his phone number. I asked him to call the cardiology office and give him his phone number so Dr. Aurelio Jew. can call

## 2013-07-08 NOTE — Progress Notes (Signed)
Pre visit review using our clinic review tool, if applicable. No additional management support is needed unless otherwise documented below in the visit note. 

## 2013-07-09 ENCOUNTER — Telehealth: Payer: Self-pay | Admitting: Cardiology

## 2013-07-09 ENCOUNTER — Telehealth: Payer: Self-pay | Admitting: Family Medicine

## 2013-07-09 NOTE — Telephone Encounter (Signed)
Patient has an appointment tomorrow 

## 2013-07-09 NOTE — Telephone Encounter (Signed)
Pt scheduled for 5/28 at 9AM  He is aware

## 2013-07-09 NOTE — Telephone Encounter (Signed)
Pt states that he has not received a call back from dr. Iris Pert, states he has called and no one will return his call nor has anyone contacted him yet. States dr. Sherren Mocha informed him that he would receive a call from dr. Iris Pert office. Requesting that dr. Sherren Mocha give them a call to see why no one has called him or returned his call

## 2013-07-09 NOTE — Telephone Encounter (Signed)
New message          Pt is returning a call to dr hochrein to get an urgent appt / pt states dr hochrein was suppose to call his PCP back yesterday afternoon to set up an appt

## 2013-07-10 ENCOUNTER — Encounter: Payer: Self-pay | Admitting: Cardiology

## 2013-07-10 ENCOUNTER — Ambulatory Visit (INDEPENDENT_AMBULATORY_CARE_PROVIDER_SITE_OTHER): Payer: Managed Care, Other (non HMO) | Admitting: Cardiology

## 2013-07-10 VITALS — BP 140/80 | HR 77 | Ht 68.0 in | Wt 207.8 lb

## 2013-07-10 DIAGNOSIS — R0602 Shortness of breath: Secondary | ICD-10-CM

## 2013-07-10 DIAGNOSIS — R079 Chest pain, unspecified: Secondary | ICD-10-CM

## 2013-07-10 DIAGNOSIS — I080 Rheumatic disorders of both mitral and aortic valves: Secondary | ICD-10-CM

## 2013-07-10 DIAGNOSIS — I10 Essential (primary) hypertension: Secondary | ICD-10-CM

## 2013-07-10 NOTE — Patient Instructions (Signed)
The current medical regimen is effective;  continue present plan and medications.  Your physician has requested that you have an echocardiogram. Echocardiography is a painless test that uses sound waves to create images of your heart. It provides your doctor with information about the size and shape of your heart and how well your heart's chambers and valves are working. This procedure takes approximately one hour. There are no restrictions for this procedure.  Your physician has requested that you have a exercise myoview. For further information please visit [email or web address]. Please follow instruction sheet, as given.  Follow up with Dr Percival Spanish in 2 months.

## 2013-07-10 NOTE — Progress Notes (Signed)
HPI The patient presents for followup of dyspnea and chest pain.  Dr. Sherren Mocha asked me to add him on.  Recently he has had some chest tightness with walking.  He has to walk up an incline for this to happen.  He describes some mild tightness across his chest. He actually since going and it eventually goes away. He does get some dyspnea with this as well. This has been going on for several weeks. He's not describing any jaw arm discomfort. He's not having any resting shortness of breath, PND or orthopnea. He has no resting symptoms. He has had some mild lower strandy swelling but his had no weight gain. He denies any cough fevers chills.  Allergies  Allergen Reactions  . Morphine And Related     Shuts bladder down.    Current Outpatient Prescriptions  Medication Sig Dispense Refill  . aspirin 325 MG tablet Take 325 mg by mouth daily.      Marland Kitchen atenolol (TENORMIN) 25 MG tablet Take 12.5 mg by mouth daily.      . insulin lispro protamine-lispro (HUMALOG 75/25) (75-25) 100 UNIT/ML SUSP Inject 15 Units into the skin at bedtime.  10 mL  11  . metFORMIN (GLUCOPHAGE) 500 MG tablet Take 1 tablet (500 mg total) by mouth 2 (two) times daily with a meal.  200 tablet  3  . simvastatin (ZOCOR) 20 MG tablet Take 1 tablet (20 mg total) by mouth at bedtime.  100 tablet  3  . SPRIX 15.75 MG/SPRAY SOLN as needed. For kidney stones       No current facility-administered medications for this visit.    Past Medical History  Diagnosis Date  . Allergy   . Diabetes mellitus   . Asthma   . Hyperlipidemia   . Hypertension   . Urinary tract disorder     outlet obstruction following surgery  . Mitral insufficiency     Past Surgical History  Procedure Laterality Date  . Inguinal hernia repair      left  . Elbow surgery    . Rotator cuff repair      ROS:  As stated in the HPI and negative for all other systems.  PHYSICAL EXAM BP 140/80  Pulse 77  Ht 5\' 8"  (1.727 m)  Wt 207 lb 12.8 oz (94.257 kg)   BMI 31.60 kg/m2  SpO2 96% GENERAL:  Well appearing NECK:  No jugular venous distention, waveform within normal limits, carotid upstroke brisk and symmetric, no bruits, no thyromegaly LYMPHATICS:  No cervical, inguinal adenopathy LUNGS:  Clear to auscultation bilaterally CHEST:  Unremarkable HEART:  PMI not displaced or sustained,S1 and S2 within normal limits, no S3, no S4, no clicks, no rubs, apical systolic murmur brief and slightly radiating to the apex. ABD:  Flat, positive bowel sounds normal in frequency in pitch, no bruits, no rebound, no guarding, no midline pulsatile mass, no hepatomegaly, no splenomegaly, umbilical hernia EXT:  2 plus pulses throughout, mild ankle edema, no cyanosis no clubbing   EKG: Sinus rhythm, rate 79, axis within normal limits, intervals within normal limits, no acute ST-T wave changes. Premature ectopic beats.  07/08/2013   ASSESSMENT AND PLAN  CHEST PAIN - He will need an exercise myoview.  MITRAL REGURGITATION -  I do not suspect that this is causing his chest pain.  However, given the symptoms of increased dyspnea he will get an echocardiogram.    OVERWEIGHT -  We have talked about this over the years. He  has lost a few pounds.  HYPERTENSION - The blood pressure is at target. No change in medications is indicated. We will continue with therapeutic lifestyle changes (TLC).

## 2013-07-16 ENCOUNTER — Telehealth (HOSPITAL_COMMUNITY): Payer: Self-pay

## 2013-07-21 ENCOUNTER — Telehealth: Payer: Self-pay | Admitting: Family Medicine

## 2013-07-21 NOTE — Telephone Encounter (Signed)
Error/gd °

## 2013-07-23 ENCOUNTER — Ambulatory Visit (HOSPITAL_COMMUNITY)
Admission: RE | Admit: 2013-07-23 | Discharge: 2013-07-23 | Disposition: A | Discharge status: Home / Self Care | Payer: Managed Care, Other (non HMO) | Source: Ambulatory Visit | Attending: Cardiovascular Disease | Admitting: Cardiovascular Disease

## 2013-07-23 ENCOUNTER — Ambulatory Visit (HOSPITAL_BASED_OUTPATIENT_CLINIC_OR_DEPARTMENT_OTHER)
Admission: RE | Admit: 2013-07-23 | Discharge: 2013-07-23 | Disposition: A | Discharge status: Home / Self Care | Payer: Managed Care, Other (non HMO) | Source: Ambulatory Visit | Attending: Cardiology | Admitting: Cardiology

## 2013-07-23 DIAGNOSIS — Z8249 Family history of ischemic heart disease and other diseases of the circulatory system: Secondary | ICD-10-CM | POA: Insufficient documentation

## 2013-07-23 DIAGNOSIS — R002 Palpitations: Secondary | ICD-10-CM | POA: Insufficient documentation

## 2013-07-23 DIAGNOSIS — R0602 Shortness of breath: Secondary | ICD-10-CM

## 2013-07-23 DIAGNOSIS — Z87891 Personal history of nicotine dependence: Secondary | ICD-10-CM | POA: Insufficient documentation

## 2013-07-23 DIAGNOSIS — I080 Rheumatic disorders of both mitral and aortic valves: Secondary | ICD-10-CM

## 2013-07-23 DIAGNOSIS — R079 Chest pain, unspecified: Secondary | ICD-10-CM | POA: Insufficient documentation

## 2013-07-23 DIAGNOSIS — R0989 Other specified symptoms and signs involving the circulatory and respiratory systems: Secondary | ICD-10-CM | POA: Insufficient documentation

## 2013-07-23 DIAGNOSIS — R42 Dizziness and giddiness: Secondary | ICD-10-CM | POA: Insufficient documentation

## 2013-07-23 DIAGNOSIS — E119 Type 2 diabetes mellitus without complications: Secondary | ICD-10-CM | POA: Insufficient documentation

## 2013-07-23 DIAGNOSIS — I059 Rheumatic mitral valve disease, unspecified: Secondary | ICD-10-CM

## 2013-07-23 DIAGNOSIS — R0609 Other forms of dyspnea: Secondary | ICD-10-CM | POA: Insufficient documentation

## 2013-07-23 DIAGNOSIS — J45909 Unspecified asthma, uncomplicated: Secondary | ICD-10-CM | POA: Insufficient documentation

## 2013-07-23 HISTORY — PX: CARDIOVASCULAR STRESS TEST: SHX262

## 2013-07-23 MED ORDER — TECHNETIUM TC 99M SESTAMIBI GENERIC - CARDIOLITE
10.0000 | Freq: Once | INTRAVENOUS | Status: AC | PRN
  Administered 2013-07-23: 10 via INTRAVENOUS

## 2013-07-23 MED ORDER — TECHNETIUM TC 99M SESTAMIBI GENERIC - CARDIOLITE
30.0000 | Freq: Once | INTRAVENOUS | Status: AC | PRN
  Administered 2013-07-23: 30 via INTRAVENOUS

## 2013-07-23 MED ORDER — AMINOPHYLLINE 25 MG/ML IV SOLN
75.0000 mg | Freq: Once | INTRAVENOUS | Status: AC
  Administered 2013-07-23: 75 mg via INTRAVENOUS

## 2013-07-23 MED ORDER — REGADENOSON 0.4 MG/5ML IV SOLN
0.4000 mg | Freq: Once | INTRAVENOUS | Status: AC
  Administered 2013-07-23: 0.4 mg via INTRAVENOUS

## 2013-07-23 NOTE — Progress Notes (Signed)
2D Echo Performed 07/23/2013    Marygrace Drought, RCS

## 2013-07-23 NOTE — Procedures (Addendum)
[facility] [address] [place] [address] [phone]  Cardiology Nuclear Med Study  David Carr Gurfateh Mcclain. is a 68 y.o. male     MRN : [id number]     DOB: 1945-09-15  Procedure Date: 07/23/2013  Nuclear Med Background Indication for Stress Test:  Evaluation for Ischemia History:  Asthma and No prior cardiac history reported;mitral regurg;No prior NUC MPI for comparison. Cardiac Risk Factors: Family History - CAD, History of Smoking, Hypertension, Lipids, NIDDM, Overweight and aortic valve insuff.  Symptoms:  Chest Pain, DOE, Light-Headedness, Palpitations, SOB and R shoulder pain   Nuclear Pre-Procedure Caffeine/Decaff Intake:  7:00pm NPO After: 5:00am   IV Site: R Forearm  IV 0.9% NS with Angio Cath:  22g  Chest Size (in):  52"  IV Started by: Rolene Course, RN  Height: 5\' 8"  (1.727 m)  Cup Size: n/a  BMI:  Body mass index is 31.48 kg/(m^2). Weight:  207 lb (93.895 kg)   Tech Comments:  Patient was unable to walk d/t marked SOB and Lightheadedness was changed to [employer] Med Study 1 or 2 day study: 1 day  Stress Test Type:  Stress  Order Authorizing Provider:  Minus Breeding, MD   Resting Radionuclide: Technetium 89m Sestamibi  Resting Radionuclide Dose: 10.2 mCi   Stress Radionuclide:  Technetium 45m Sestamibi  Stress Radionuclide Dose: 29.6 mCi           Stress Protocol Rest HR: 81 Stress HR:  90  Rest BP: 136/68 Stress BP: 144/60  Exercise Time (min): n/a METS: n/a   Predicted Max HR: 151 bpm % Max HR: 60.26 bpm Rate Pressure Product: 13104  Dose of Adenosine (mg):  n/a Dose of Lexiscan: 0.4 mg  Dose of Atropine (mg): n/a Dose of Dobutamine: n/a mcg/kg/min (at max HR)  Stress Test Technologist: Leane Para, CCT Nuclear Technologist: Otho Perl, CNMT   Rest Procedure:  Myocardial perfusion imaging was performed at rest 45 minutes following the intravenous administration of Technetium  60m Sestamibi. Stress Procedure:  The patient received IV Lexiscan 0.4 mg over 15-seconds.  Technetium 3m Sestamibi injected IV at 30-seconds.  Patient experienced marked SOB and 75 mg of Aminophylline IV was administered at 5 minutes.  There were no significant changes with Lexiscan.  Quantitative spect images were obtained after a 45 minute delay.  Transient Ischemic Dilatation (Normal <1.22):  1.07 QGS EDV:  93 ml QGS ESV:  29 ml LV Ejection Fraction: 69%       Rest ECG: NSR - Normal EKG  Stress ECG: There are scattered PVCs.  QPS Raw Data Images:  Normal; no motion artifact; normal heart/lung ratio. Stress Images:  There is decreased uptake in the inferior wall. Rest Images:  Normal homogeneous uptake in all areas of the myocardium. Subtraction (SDS):  These findings are consistent with ischemia.  Impression Exercise Capacity:  Lexiscan with no exercise. BP Response:  Normal blood pressure response. Clinical Symptoms:  Mild SOB ECG Impression:  No significant ST segment change suggestive of ischemia. Comparison with Prior Nuclear Study: No images to compare  Overall Impression:  Low risk stress nuclear study Mild inferior ischemia.  LV Wall Motion:  NL LV Function; NL Wall Motion   Lorretta Harp, MD  07/23/2013 5:32 PM

## 2013-08-01 NOTE — Telephone Encounter (Signed)
New message    Patient calling asking for test results .    Patient did not want his number added to system.  [phone]

## 2013-08-01 NOTE — Telephone Encounter (Signed)
Pt is aware OK to follow up in 1 year if no s/s.

## 2013-08-01 NOTE — Telephone Encounter (Signed)
Spoke with pt about results of Lexiscan and echo.  He states he is having no symptoms now and would like to know if it is necessary to return in 4 to 6 weeks as ordered by Dr Percival Spanish.  Aware I will ask Dr Percival Spanish and let him know when to follow up.   He also states the lexiscan medication made him feel "horrible and never wants to take it again.

## 2013-08-01 NOTE — Telephone Encounter (Signed)
If he is doing OK he can follow up in one year. Call Mr. Ditullio with the results

## 2013-08-22 ENCOUNTER — Other Ambulatory Visit (INDEPENDENT_AMBULATORY_CARE_PROVIDER_SITE_OTHER): Payer: Managed Care, Other (non HMO)

## 2013-08-22 DIAGNOSIS — E119 Type 2 diabetes mellitus without complications: Secondary | ICD-10-CM

## 2013-08-22 LAB — LIPID PANEL
CHOLESTEROL: 104 mg/dL (ref 0–200)
HDL: 32.7 mg/dL — AB (ref >39.00)
LDL CALC: 53 mg/dL (ref 0–99)
NonHDL: 71.3
TRIGLYCERIDES: 94 mg/dL (ref 0.0–149.0)
Total CHOL/HDL Ratio: 3
VLDL: 18.8 mg/dL (ref 0.0–40.0)

## 2013-08-22 LAB — CBC WITH DIFFERENTIAL/PLATELET
Basophils Absolute: 0 10*3/uL (ref 0.0–0.1)
Basophils Relative: 0.3 % (ref 0.0–3.0)
EOS ABS: 0 10*3/uL (ref 0.0–0.7)
Eosinophils Relative: 0.2 % (ref 0.0–5.0)
HCT: 37 % — ABNORMAL LOW (ref 39.0–52.0)
Hemoglobin: 12.3 g/dL — ABNORMAL LOW (ref 13.0–17.0)
LYMPHS ABS: 1.7 10*3/uL (ref 0.7–4.0)
LYMPHS PCT: 30.2 % (ref 12.0–46.0)
MCHC: 33.3 g/dL (ref 30.0–36.0)
MCV: 109.9 fl — ABNORMAL HIGH (ref 78.0–100.0)
MONOS PCT: 21.3 % — AB (ref 3.0–12.0)
Monocytes Absolute: 1.2 10*3/uL — ABNORMAL HIGH (ref 0.1–1.0)
NEUTROS ABS: 2.7 10*3/uL (ref 1.4–7.7)
Neutrophils Relative %: 48 % (ref 43.0–77.0)
PLATELETS: 90 10*3/uL — AB (ref 150.0–400.0)
RBC: 3.37 Mil/uL — ABNORMAL LOW (ref 4.22–5.81)
RDW: 18.5 % — ABNORMAL HIGH (ref 11.5–15.5)
WBC: 5.7 10*3/uL (ref 4.0–10.5)

## 2013-08-22 LAB — BASIC METABOLIC PANEL
BUN: 19 mg/dL (ref 6–23)
CHLORIDE: 104 meq/L (ref 96–112)
CO2: 22 mEq/L (ref 19–32)
Calcium: 8.8 mg/dL (ref 8.4–10.5)
Creatinine, Ser: 0.9 mg/dL (ref 0.4–1.5)
GFR: 87.54 mL/min (ref >60.00)
GLUCOSE: 120 mg/dL — AB (ref 70–99)
POTASSIUM: 3.9 meq/L (ref 3.5–5.1)
Sodium: 139 mEq/L (ref 135–145)

## 2013-08-22 LAB — POCT URINALYSIS DIPSTICK
Bilirubin, UA: NEGATIVE
GLUCOSE UA: NEGATIVE
KETONES UA: NEGATIVE
LEUKOCYTES UA: NEGATIVE
Nitrite, UA: NEGATIVE
PH UA: 5.5
RBC UA: NEGATIVE
Spec Grav, UA: >=1.03
Urobilinogen, UA: 0.2

## 2013-08-22 LAB — MICROALBUMIN / CREATININE URINE RATIO
Creatinine,U: 282.7 mg/dL
Microalb Creat Ratio: 0.3 mg/g (ref 0.0–30.0)
Microalb, Ur: 0.9 mg/dL (ref 0.0–1.9)

## 2013-08-22 LAB — HEPATIC FUNCTION PANEL
ALBUMIN: 3.8 g/dL (ref 3.5–5.2)
ALT: 17 U/L (ref 0–53)
AST: 23 U/L (ref 0–37)
Alkaline Phosphatase: 41 U/L (ref 39–117)
BILIRUBIN DIRECT: 0.2 mg/dL (ref 0.0–0.3)
TOTAL PROTEIN: 6.6 g/dL (ref 6.0–8.3)
Total Bilirubin: 0.8 mg/dL (ref 0.2–1.2)

## 2013-08-22 LAB — HEMOGLOBIN A1C: Hgb A1c MFr Bld: 7.2 % — ABNORMAL HIGH (ref 4.6–6.5)

## 2013-08-22 LAB — PSA: PSA: 3.17 ng/mL (ref 0.10–4.00)

## 2013-08-22 LAB — TSH: TSH: 2.94 u[IU]/mL (ref 0.35–4.50)

## 2013-08-28 ENCOUNTER — Ambulatory Visit (INDEPENDENT_AMBULATORY_CARE_PROVIDER_SITE_OTHER): Payer: Managed Care, Other (non HMO) | Admitting: Family Medicine

## 2013-08-28 ENCOUNTER — Encounter: Payer: Self-pay | Admitting: Family Medicine

## 2013-08-28 VITALS — BP 120/70 | Temp 99.3°F | Ht 68.25 in | Wt 211.0 lb

## 2013-08-28 DIAGNOSIS — D649 Anemia, unspecified: Secondary | ICD-10-CM | POA: Insufficient documentation

## 2013-08-28 DIAGNOSIS — E785 Hyperlipidemia, unspecified: Secondary | ICD-10-CM

## 2013-08-28 DIAGNOSIS — E109 Type 1 diabetes mellitus without complications: Secondary | ICD-10-CM

## 2013-08-28 DIAGNOSIS — I10 Essential (primary) hypertension: Secondary | ICD-10-CM

## 2013-08-28 DIAGNOSIS — I080 Rheumatic disorders of both mitral and aortic valves: Secondary | ICD-10-CM

## 2013-08-28 DIAGNOSIS — E663 Overweight: Secondary | ICD-10-CM

## 2013-08-28 DIAGNOSIS — E119 Type 2 diabetes mellitus without complications: Secondary | ICD-10-CM

## 2013-08-28 DIAGNOSIS — Z23 Encounter for immunization: Secondary | ICD-10-CM

## 2013-08-28 LAB — CBC WITH DIFFERENTIAL/PLATELET
Basophils Absolute: 0 10*3/uL (ref 0.0–0.1)
Basophils Relative: 0.4 % (ref 0.0–3.0)
EOS ABS: 0 10*3/uL (ref 0.0–0.7)
Eosinophils Relative: 0.1 % (ref 0.0–5.0)
HEMATOCRIT: 35.1 % — AB (ref 39.0–52.0)
Hemoglobin: 11.8 g/dL — ABNORMAL LOW (ref 13.0–17.0)
Lymphocytes Relative: 24.7 % (ref 12.0–46.0)
Lymphs Abs: 1.2 10*3/uL (ref 0.7–4.0)
MCHC: 33.6 g/dL (ref 30.0–36.0)
MCV: 109.4 fl — AB (ref 78.0–100.0)
MONO ABS: 0.9 10*3/uL (ref 0.1–1.0)
Monocytes Relative: 17.6 % — ABNORMAL HIGH (ref 3.0–12.0)
NEUTROS PCT: 57.2 % (ref 43.0–77.0)
Neutro Abs: 2.8 10*3/uL (ref 1.4–7.7)
PLATELETS: 86 10*3/uL — AB (ref 150.0–400.0)
RBC: 3.21 Mil/uL — ABNORMAL LOW (ref 4.22–5.81)
RDW: 18.4 % — ABNORMAL HIGH (ref 11.5–15.5)
WBC: 5 10*3/uL (ref 4.0–10.5)

## 2013-08-28 LAB — IRON: IRON: 134 ug/dL (ref 42–165)

## 2013-08-28 LAB — FERRITIN: FERRITIN: 230.6 ng/mL (ref 22.0–322.0)

## 2013-08-28 MED ORDER — SIMVASTATIN 20 MG PO TABS: 20.0000 mg | ORAL_TABLET | Freq: Every day | ORAL | Status: DC

## 2013-08-28 MED ORDER — ATENOLOL 25 MG PO TABS: 12.5000 mg | ORAL_TABLET | Freq: Every day | ORAL | Status: DC

## 2013-08-28 MED ORDER — INSULIN LISPRO PROT & LISPRO (75-25 MIX) 100 UNIT/ML ~~LOC~~ SUSP: 15.0000 [IU] | Freq: Every day | SUBCUTANEOUS | Status: DC

## 2013-08-28 MED ORDER — METFORMIN HCL 500 MG PO TABS: 500.0000 mg | ORAL_TABLET | Freq: Two times a day (BID) | ORAL | Status: DC

## 2013-08-28 NOTE — Progress Notes (Signed)
   Subjective:    Patient ID: David Carr., male    DOB: 1945/10/20, 68 y.o.   MRN: [id number]  HPI David Carr is a 68 year old married male nonsmoker who comes in today for evaluation of multiple issues  He has a history of mitral valve disease. I saw him a couple months ago with shortness of breath. Dr. Maylon Cos H. did a complete evaluation. Echo and cardiac studies were normal.  He's a type I diabetic on insulin 15 units daily along with metformin 500 mg twice a day. A1c 7.2%  His underlying hypertension he's on atenolol BP 120/70.  His hyperlipidemia on Zocor 20 mg daily.  His labs shows a decrease of hemoglobin from 16-12. He also relates in the past month he said red splotches on left and right in her arms. Nonperiodic. No trauma no change in medication no over-the-counter medication roots herbs or NSAIDs.  Vaccination history updated by Apolonio Schneiders  Cognitive function normal he walks twice daily home health safety reviewed no issues identified, no guns in the house, he does have a health care power of attorney and living well   Review of Systems  Constitutional: Negative.   HENT: Negative.   Eyes: Negative.   Respiratory: Negative.   Cardiovascular: Negative.   Gastrointestinal: Negative.   Genitourinary: Negative.   Musculoskeletal: Negative.   Skin: Negative.   Neurological: Negative.   Psychiatric/Behavioral: Negative.        Objective:   Physical Exam  Nursing note and vitals reviewed. Constitutional: He is oriented to person, place, and time. He appears well-developed and well-nourished.  HENT:  Head: Normocephalic and atraumatic.  Right Ear: External ear normal.  Left Ear: External ear normal.  Nose: Nose normal.  Mouth/Throat: Oropharynx is clear and moist.  Eyes: Conjunctivae and EOM are normal. Pupils are equal, round, and reactive to light.  Neck: Normal range of motion. Neck supple. No JVD present. No tracheal deviation present. No thyromegaly present.    Cardiovascular: Normal rate, regular rhythm and intact distal pulses.  Exam reveals no gallop and no friction rub.   Murmur heard. Grade 3 murmur of mitral valve disease  Pulmonary/Chest: Effort normal and breath sounds normal. No stridor. No respiratory distress. He has no wheezes. He has no rales. He exhibits no tenderness.  Abdominal: Soft. Bowel sounds are normal. He exhibits no distension and no mass. There is no tenderness. There is no rebound and no guarding.  Genitourinary: Rectum normal and penis normal. Guaiac negative stool. No penile tenderness.  2+ symmetrical nonnodular BPH  Musculoskeletal: Normal range of motion. He exhibits no edema and no tenderness.  Lymphadenopathy:    He has no cervical adenopathy.  Neurological: He is alert and oriented to person, place, and time. He has normal reflexes. No cranial nerve deficit. He exhibits normal muscle tone.  Skin: Skin is warm and dry. No rash noted. No erythema. No pallor.  Total body skin exam normal except for petechial rash in her arms left and right........  Psychiatric: He has a normal mood and affect. His behavior is normal. Judgment and thought content normal.          Assessment & Plan:  Healthy male  Diabetes type 1 continue insulin and metformin  Hyperlipidemia continue Zocor  Hypertension continue atenolol

## 2013-08-28 NOTE — Patient Instructions (Signed)
Continue current medication except stop the aspirin  Labs today  We will call you tomorrow with the report

## 2013-08-28 NOTE — Progress Notes (Signed)
Pre visit review using our clinic review tool, if applicable. No additional management support is needed unless otherwise documented below in the visit note. 

## 2013-08-29 ENCOUNTER — Telehealth: Payer: Self-pay | Admitting: Family Medicine

## 2013-08-29 ENCOUNTER — Other Ambulatory Visit: Payer: Self-pay | Admitting: Family Medicine

## 2013-08-29 DIAGNOSIS — D649 Anemia, unspecified: Secondary | ICD-10-CM

## 2013-08-29 DIAGNOSIS — R899 Unspecified abnormal finding in specimens from other organs, systems and tissues: Secondary | ICD-10-CM

## 2013-08-29 LAB — RETICULOCYTES
ABS Retic: 66.2 10*3/uL (ref 19.0–186.0)
RBC.: 3.31 MIL/uL — ABNORMAL LOW (ref 4.22–5.81)
Retic Ct Pct: 2 % (ref 0.4–2.3)

## 2013-08-29 NOTE — Telephone Encounter (Signed)
Relevant patient education mailed to patient.  

## 2013-09-01 ENCOUNTER — Telehealth: Payer: Self-pay | Admitting: Hematology and Oncology

## 2013-09-01 ENCOUNTER — Other Ambulatory Visit: Payer: Managed Care, Other (non HMO)

## 2013-09-01 NOTE — Telephone Encounter (Signed)
S/W PATIENT AND GAVE NP APPT FOR 07/23 @ 9:45 W/DR. [place].  REFERRING DR. JEFFREY TODD  DX- ANEMIA; UNSPECIFIED ANEMIA TYPE

## 2013-09-02 ENCOUNTER — Other Ambulatory Visit: Payer: Self-pay | Admitting: *Deleted

## 2013-09-02 MED ORDER — GLUCOSE BLOOD VI STRP: ORAL_STRIP | Status: DC

## 2013-09-02 MED ORDER — "INSULIN SYRINGE 29G X 1/2"" 0.5 ML MISC": Status: DC

## 2013-09-02 NOTE — Telephone Encounter (Signed)
Rx sent to pharmacy   

## 2013-09-04 ENCOUNTER — Encounter: Payer: Self-pay | Admitting: Hematology and Oncology

## 2013-09-04 ENCOUNTER — Telehealth: Payer: Self-pay | Admitting: Hematology and Oncology

## 2013-09-04 ENCOUNTER — Ambulatory Visit (HOSPITAL_BASED_OUTPATIENT_CLINIC_OR_DEPARTMENT_OTHER): Payer: Managed Care, Other (non HMO) | Admitting: Hematology and Oncology

## 2013-09-04 ENCOUNTER — Other Ambulatory Visit: Payer: Self-pay | Admitting: Hematology and Oncology

## 2013-09-04 ENCOUNTER — Ambulatory Visit: Payer: Managed Care, Other (non HMO)

## 2013-09-04 ENCOUNTER — Ambulatory Visit (HOSPITAL_BASED_OUTPATIENT_CLINIC_OR_DEPARTMENT_OTHER): Payer: Managed Care, Other (non HMO)

## 2013-09-04 VITALS — BP 142/78 | HR 70 | Temp 99.0°F | Resp 18 | Ht 68.25 in | Wt 205.0 lb

## 2013-09-04 DIAGNOSIS — D649 Anemia, unspecified: Secondary | ICD-10-CM

## 2013-09-04 DIAGNOSIS — D469 Myelodysplastic syndrome, unspecified: Secondary | ICD-10-CM

## 2013-09-04 DIAGNOSIS — D696 Thrombocytopenia, unspecified: Secondary | ICD-10-CM | POA: Insufficient documentation

## 2013-09-04 DIAGNOSIS — D630 Anemia in neoplastic disease: Secondary | ICD-10-CM

## 2013-09-04 LAB — CBC & DIFF AND RETIC
BASO%: 0.2 % (ref 0.0–2.0)
BASOS ABS: 0 10*3/uL (ref 0.0–0.1)
EOS%: 0.2 % (ref 0.0–7.0)
Eosinophils Absolute: 0 10*3/uL (ref 0.0–0.5)
HEMATOCRIT: 34.6 % — AB (ref 38.4–49.9)
HEMOGLOBIN: 12.1 g/dL — AB (ref 13.0–17.1)
Immature Retic Fract: 26.3 % — ABNORMAL HIGH (ref 3.00–10.60)
LYMPH#: 1.3 10*3/uL (ref 0.9–3.3)
LYMPH%: 27.2 % (ref 14.0–49.0)
MCH: 37 pg — ABNORMAL HIGH (ref 27.2–33.4)
MCHC: 35 g/dL (ref 32.0–36.0)
MCV: 105.8 fL — ABNORMAL HIGH (ref 79.3–98.0)
MONO#: 1.3 10*3/uL — AB (ref 0.1–0.9)
MONO%: 26.2 % — ABNORMAL HIGH (ref 0.0–14.0)
NEUT%: 46.2 % (ref 39.0–75.0)
NEUTROS ABS: 2.2 10*3/uL (ref 1.5–6.5)
PLATELETS: 81 10*3/uL — AB (ref 140–400)
RBC: 3.27 10*6/uL — ABNORMAL LOW (ref 4.20–5.82)
RDW: 15.9 % — ABNORMAL HIGH (ref 11.0–14.6)
RETIC CT ABS: 83.39 10*3/uL (ref 34.80–93.90)
Retic %: 2.55 % — ABNORMAL HIGH (ref 0.80–1.80)
WBC: 4.8 10*3/uL (ref 4.0–10.3)
nRBC: 1 % — ABNORMAL HIGH (ref 0–0)

## 2013-09-04 LAB — COMPREHENSIVE METABOLIC PANEL (CC13)
ALT: 15 U/L (ref 0–55)
AST: 18 U/L (ref 5–34)
Albumin: 3.8 g/dL (ref 3.5–5.0)
Alkaline Phosphatase: 50 U/L (ref 40–150)
Anion Gap: 9 mEq/L (ref 3–11)
BILIRUBIN TOTAL: 0.85 mg/dL (ref 0.20–1.20)
BUN: 14 mg/dL (ref 7.0–26.0)
CHLORIDE: 107 meq/L (ref 98–109)
CO2: 27 mEq/L (ref 22–29)
CREATININE: 0.9 mg/dL (ref 0.7–1.3)
Calcium: 9.2 mg/dL (ref 8.4–10.4)
Glucose: 122 mg/dl (ref 70–140)
Potassium: 4 mEq/L (ref 3.5–5.1)
SODIUM: 143 meq/L (ref 136–145)
TOTAL PROTEIN: 7.3 g/dL (ref 6.4–8.3)

## 2013-09-04 LAB — MORPHOLOGY: PLT EST: DECREASED

## 2013-09-04 LAB — VITAMIN B12: Vitamin B-12: 599 pg/mL (ref 211–911)

## 2013-09-04 LAB — IRON AND TIBC CHCC
%SAT: 62 % — AB (ref 20–55)
IRON: 156 ug/dL (ref 42–163)
TIBC: 250 ug/dL (ref 202–409)
UIBC: 95 ug/dL — AB (ref 117–376)

## 2013-09-04 LAB — LACTATE DEHYDROGENASE (CC13): LDH: 275 U/L — ABNORMAL HIGH (ref 125–245)

## 2013-09-04 LAB — CHCC SMEAR

## 2013-09-04 LAB — FERRITIN CHCC: FERRITIN: 302 ng/mL (ref 22–316)

## 2013-09-04 LAB — SEDIMENTATION RATE: SED RATE: 28 mm/h — AB (ref 0–16)

## 2013-09-04 NOTE — Progress Notes (Signed)
[name] NOTE  Patient Care Team: Dorena Cookey, MD as PCP - General  CHIEF COMPLAINTS/PURPOSE OF CONSULTATION:  Abnormal CBC with anemia and thrombocytopenia HISTORY OF PRESENTING ILLNESS:  David Carr. 68 y.o. male is here because of abnormal CBC.  He was found to have abnormal CBC from his physician office after presentation with shortness of breath, dizziness, chest tightness and fatigue. He saw his cardiologist for evaluation of mitral regurgitation an echocardiogram showed preserved ejection fraction.  His baseline CBC on 09/05/2012 was normal. White blood cell count 5.9, hemoglobin 16.6 and platelet count 187,000. On 08/22/2013, white count 5.7, hemoglobin 12.3, MCV 109.9 and platelet count 90,000. He was repeated on 08/28/2013, white blood cell count of 5, hemoglobin 11.8, MCV 109.4 and platelet count 86,000.  He had not noticed any recent bleeding such as epistaxis, hematuria or hematochezia The patient denies over the counter NSAID ingestion. He is on antiplatelets agents with aspirin.Marland Kitchen His last colonoscopy was 2009 and it was normal. He had no prior history or diagnosis of cancer. His age appropriate screening programs are up-to-date. He denies any pica and eats a variety of diet. He never donated blood or received blood transfusion Recently, he had a spider bite in his left lower leg, subsequently resolved. He has new onset of rash in both forearms but it does not bother him.  MEDICAL HISTORY:  Past Medical History  Diagnosis Date  . Allergy   . Diabetes mellitus   . Asthma   . Hyperlipidemia   . Hypertension   . Urinary tract disorder     outlet obstruction following surgery  . Mitral insufficiency   . Thrombocytopenia, unspecified 09/04/2013    SURGICAL HISTORY: Past Surgical History  Procedure Laterality Date  . Inguinal hernia repair      left  . Elbow surgery    . Rotator cuff repair      SOCIAL HISTORY: History    Social History  . Marital Status: Married    Spouse Name: N/A    Number of Children: N/A  . Years of Education: N/A   Occupational History  . Not on file.   Social History Main Topics  . Smoking status: Former Research scientist (life sciences)  . Smokeless tobacco: Never Used  . Alcohol Use: No  . Drug Use: No  . Sexual Activity: Not on file   Other Topics Concern  . Not on file   Social History Narrative  . No narrative on file    FAMILY HISTORY: Family History  Problem Relation Age of Onset  . Asthma Sister   . Cancer Daughter     carcinoid tumor    ALLERGIES:  is allergic to morphine and related.  MEDICATIONS:  Current Outpatient Prescriptions  Medication Sig Dispense Refill  . atenolol (TENORMIN) 25 MG tablet Take 0.5 tablets (12.5 mg total) by mouth daily.  100 tablet  3  . glucose blood (FREESTYLE LITE) test strip Test once daily dx 250.00  100 each  3  . insulin lispro protamine-lispro (HUMALOG 75/25 MIX) (75-25) 100 UNIT/ML SUSP injection Inject 15 Units into the skin at bedtime.  10 mL  11  . INSULIN SYRINGE .5CC/29G (B-D INSULIN SYRINGE) 29G X 1/2" 0.5 ML MISC Use once daily, dx 250.00  100 each  3  . metFORMIN (GLUCOPHAGE) 500 MG tablet Take 1 tablet (500 mg total) by mouth 2 (two) times daily with a meal.  200 tablet  3  . simvastatin (ZOCOR) 20 MG tablet  Take 1 tablet (20 mg total) by mouth at bedtime.  100 tablet  3  . SPRIX 15.75 MG/SPRAY SOLN as needed. For kidney stones       No current facility-administered medications for this visit.    REVIEW OF SYSTEMS:   Constitutional: Denies fevers, chills or abnormal night sweats Eyes: Denies blurriness of vision, double vision or watery eyes Ears, nose, mouth, throat, and face: Denies mucositis or sore throat Gastrointestinal:  Denies nausea, heartburn or change in bowel habits Lymphatics: Denies new lymphadenopathy or easy bruising Neurological:Denies numbness, tingling or new weaknesses Behavioral/Psych: Mood is stable, no  new changes  All other systems were reviewed with the patient and are negative.  PHYSICAL EXAMINATION: ECOG PERFORMANCE STATUS: 1 - Symptomatic but completely ambulatory  Filed Vitals:   09/04/13 1006  BP: 142/78  Pulse: 70  Temp: 99 F (37.2 C)  Resp: 18   Filed Weights   09/04/13 1006  Weight: 205 lb (92.987 kg)    GENERAL:alert, no distress and comfortable SKIN: Noted macular papular rash on both forearms, nonspecific. Etiology unknown. No petechiae. EYES: normal, conjunctiva are pink and non-injected, sclera clear OROPHARYNX:no exudate, no erythema and lips, buccal mucosa, and tongue normal  NECK: supple, thyroid normal size, non-tender, without nodularity LYMPH:  no palpable lymphadenopathy in the cervical, axillary or inguinal LUNGS: clear to auscultation and percussion with normal breathing effort HEART: regular rate & rhythm and no murmurs and no lower extremity edema ABDOMEN:abdomen soft, non-tender and normal bowel sounds. No splenomegaly Musculoskeletal:no cyanosis of digits and no clubbing  PSYCH: alert & oriented x 3 with fluent speech NEURO: no focal motor/sensory deficits  LABORATORY DATA:  I have reviewed the data as listed Recent Results (from the past 2160 hour(s))  LIPID PANEL     Status: Abnormal   Collection Time    08/22/13  7:58 AM      Result Value Ref Range   Cholesterol 104  0 - 200 mg/dL   Comment: ATP III Classification       Desirable:  < 200 mg/dL               Borderline High:  200 - 239 mg/dL          High:  > = 240 mg/dL   Triglycerides 94.0  0.0 - 149.0 mg/dL   Comment: Normal:  <150 mg/dLBorderline High:  150 - 199 mg/dL   HDL 32.70 (*) >39.00 mg/dL   VLDL 18.8  0.0 - 40.0 mg/dL   LDL Cholesterol 53  0 - 99 mg/dL   Total CHOL/HDL Ratio 3     Comment:                Men          Women1/2 Average Risk     3.4          3.3Average Risk          5.0          4.42X Average Risk          9.6          7.13X Average Risk          15.0           11.0                       NonHDL 71.30    HEPATIC FUNCTION PANEL     Status: None   Collection Time  08/22/13  7:58 AM      Result Value Ref Range   Total Bilirubin 0.8  0.2 - 1.2 mg/dL   Bilirubin, Direct 0.2  0.0 - 0.3 mg/dL   Alkaline Phosphatase 41  39 - 117 U/L   AST 23  0 - 37 U/L   ALT 17  0 - 53 U/L   Total Protein 6.6  6.0 - 8.3 g/dL   Albumin 3.8  3.5 - 5.2 g/dL  PSA     Status: None   Collection Time    08/22/13  7:58 AM      Result Value Ref Range   PSA 3.17  0.10 - 4.00 ng/mL  CBC WITH DIFFERENTIAL     Status: Abnormal   Collection Time    08/22/13  7:58 AM      Result Value Ref Range   WBC 5.7  4.0 - 10.5 K/uL   RBC 3.37 (*) 4.22 - 5.81 Mil/uL   Hemoglobin 12.3 (*) 13.0 - 17.0 g/dL   HCT 37.0 (*) 39.0 - 52.0 %   MCV 109.9 (*) 78.0 - 100.0 fl   MCHC 33.3  30.0 - 36.0 g/dL   RDW 18.5 (*) 11.5 - 15.5 %   Platelets 90.0 (*) 150.0 - 400.0 K/uL   Neutrophils Relative % 48.0  43.0 - 77.0 %   Lymphocytes Relative 30.2  12.0 - 46.0 %   Monocytes Relative 21.3 (*) 3.0 - 12.0 %   Comment: A Manual Differential was performed and is consistent with the Automated Differential.Platelet Estimation -  Decreased   Eosinophils Relative 0.2  0.0 - 5.0 %   Basophils Relative 0.3  0.0 - 3.0 %   Neutro Abs 2.7  1.4 - 7.7 K/uL   Lymphs Abs 1.7  0.7 - 4.0 K/uL   Monocytes Absolute 1.2 (*) 0.1 - 1.0 K/uL   Eosinophils Absolute 0.0  0.0 - 0.7 K/uL   Basophils Absolute 0.0  0.0 - 0.1 K/uL  HEMOGLOBIN A1C     Status: Abnormal   Collection Time    08/22/13  7:58 AM      Result Value Ref Range   Hemoglobin A1C 7.2 (*) 4.6 - 6.5 %   Comment: Glycemic Control Guidelines for People with Diabetes:Non Diabetic:  <6%Goal of Therapy: <7%Additional Action Suggested:  >8%   TSH     Status: None   Collection Time    08/22/13  7:58 AM      Result Value Ref Range   TSH 2.94  0.35 - 4.50 uIU/mL  MICROALBUMIN / CREATININE URINE RATIO     Status: None   Collection Time    08/22/13  7:58 AM       Result Value Ref Range   Microalb, Ur 0.9  0.0 - 1.9 mg/dL   Creatinine,U 282.7     Microalb Creat Ratio 0.3  0.0 - 30.0 mg/g  BASIC METABOLIC PANEL     Status: Abnormal   Collection Time    08/22/13  7:58 AM      Result Value Ref Range   Sodium 139  135 - 145 mEq/L   Potassium 3.9  3.5 - 5.1 mEq/L   Chloride 104  96 - 112 mEq/L   CO2 22  19 - 32 mEq/L   Glucose, Bld 120 (*) 70 - 99 mg/dL   BUN 19  6 - 23 mg/dL   Creatinine, Ser 0.9  0.4 - 1.5 mg/dL   Calcium 8.8  8.4 - 10.5 mg/dL  GFR 87.54  >60.00 mL/min  POCT URINALYSIS DIPSTICK     Status: None   Collection Time    08/22/13  8:39 AM      Result Value Ref Range   Color, UA yellow     Clarity, UA clear     Glucose, UA n     Bilirubin, UA n     Ketones, UA n     Spec Grav, UA >=1.030     Blood, UA n     pH, UA 5.5     Protein, UA 1+     Urobilinogen, UA 0.2     Nitrite, UA n     Leukocytes, UA Negative    CBC WITH DIFFERENTIAL     Status: Abnormal   Collection Time    08/28/13  2:17 PM      Result Value Ref Range   WBC 5.0  4.0 - 10.5 K/uL   RBC 3.21 (*) 4.22 - 5.81 Mil/uL   Hemoglobin 11.8 (*) 13.0 - 17.0 g/dL   HCT 35.1 (*) 39.0 - 52.0 %   MCV 109.4 (*) 78.0 - 100.0 fl   MCHC 33.6  30.0 - 36.0 g/dL   RDW 18.4 (*) 11.5 - 15.5 %   Platelets 86.0 (*) 150.0 - 400.0 K/uL   Neutrophils Relative % 57.2  43.0 - 77.0 %   Lymphocytes Relative 24.7  12.0 - 46.0 %   Monocytes Relative 17.6 (*) 3.0 - 12.0 %   Eosinophils Relative 0.1  0.0 - 5.0 %   Basophils Relative 0.4  0.0 - 3.0 %   Neutro Abs 2.8  1.4 - 7.7 K/uL   Lymphs Abs 1.2  0.7 - 4.0 K/uL   Monocytes Absolute 0.9  0.1 - 1.0 K/uL   Eosinophils Absolute 0.0  0.0 - 0.7 K/uL   Basophils Absolute 0.0  0.0 - 0.1 K/uL  FERRITIN     Status: None   Collection Time    08/28/13  2:17 PM      Result Value Ref Range   Ferritin 230.6  22.0 - 322.0 ng/mL  RETICULOCYTES     Status: Abnormal   Collection Time    08/28/13  2:17 PM      Result Value Ref Range    Retic Ct Pct 2.0  0.4 - 2.3 %   RBC. 3.31 (*) 4.22 - 5.81 MIL/uL   ABS Retic 66.2  19.0 - 186.0 K/uL  IRON     Status: None   Collection Time    08/28/13  2:17 PM      Result Value Ref Range   Iron 134  42 - 165 ug/dL  CBC & DIFF AND RETIC     Status: Abnormal   Collection Time    09/04/13 11:15 AM      Result Value Ref Range   WBC 4.8  4.0 - 10.3 10e3/uL   NEUT# 2.2  1.5 - 6.5 10e3/uL   HGB 12.1 (*) 13.0 - 17.1 g/dL   HCT 34.6 (*) 38.4 - 49.9 %   Platelets 81 (*) 140 - 400 10e3/uL   MCV 105.8 (*) 79.3 - 98.0 fL   MCH 37.0 (*) 27.2 - 33.4 pg   MCHC 35.0  32.0 - 36.0 g/dL   RBC 3.27 (*) 4.20 - 5.82 10e6/uL   RDW 15.9 (*) 11.0 - 14.6 %   lymph# 1.3  0.9 - 3.3 10e3/uL   MONO# 1.3 (*) 0.1 - 0.9 10e3/uL   Eosinophils Absolute 0.0  0.0 - 0.5 10e3/uL   Basophils Absolute 0.0  0.0 - 0.1 10e3/uL   NEUT% 46.2  39.0 - 75.0 %   LYMPH% 27.2  14.0 - 49.0 %   MONO% 26.2 (*) 0.0 - 14.0 %   EOS% 0.2  0.0 - 7.0 %   BASO% 0.2  0.0 - 2.0 %   nRBC 1 (*) 0 - 0 %   Retic % 2.55 (*) 0.80 - 1.80 %   Retic Ct Abs 83.39  34.80 - 93.90 10e3/uL   Immature Retic Fract 26.30 (*) 3.00 - 10.60 %  LACTATE DEHYDROGENASE (CC13)     Status: Abnormal   Collection Time    09/04/13 11:15 AM      Result Value Ref Range   LDH 275 (*) 125 - 245 U/L  MORPHOLOGY     Status: None   Collection Time    09/04/13 11:15 AM      Result Value Ref Range   Polychromasia Moderate  Slight   Tear Drop Cells Few  Negative   Ovalocytes Few  Negative   Blasts 3 with auer rods  0 - 0 %   White Cell Comments 1% Metas and 2% Myelos present     PLT EST Decreased  Adequate   Platelet Morphology occ Large and giant platelets  Within Normal Limits  [facility] SMEAR     Status: None   Collection Time    09/04/13 11:15 AM      Result Value Ref Range   Smear Result Smear Available    IRON AND TIBC [facility]     Status: Abnormal   Collection Time    09/04/13 11:18 AM      Result Value Ref Range   Iron 156  42 - 163 ug/dL   TIBC 250  202  - 409 ug/dL   UIBC 95 (*) 117 - 376 ug/dL   %SAT 62 (*) 20 - 55 %  FERRITIN [facility]     Status: None   Collection Time    09/04/13 11:18 AM      Result Value Ref Range   Ferritin 302  22 - 316 ng/ml  COMPREHENSIVE METABOLIC PANEL (TT01)     Status: None   Collection Time    09/04/13 11:19 AM      Result Value Ref Range   Sodium 143  136 - 145 mEq/L   Potassium 4.0  3.5 - 5.1 mEq/L   Chloride 107  98 - 109 mEq/L   CO2 27  22 - 29 mEq/L   Glucose 122  70 - 140 mg/dl   BUN 14.0  7.0 - 26.0 mg/dL   Creatinine 0.9  0.7 - 1.3 mg/dL   Total Bilirubin 0.85  0.20 - 1.20 mg/dL   Alkaline Phosphatase 50  40 - 150 U/L   AST 18  5 - 34 U/L   ALT 15  0 - 55 U/L   Total Protein 7.3  6.4 - 8.3 g/dL   Albumin 3.8  3.5 - 5.0 g/dL   Calcium 9.2  8.4 - 10.4 mg/dL   Anion Gap 9  3 - 11 mEq/L   I reviewed the peripheral smear and saw last cell with Auer Rods ASSESSMENT & PLAN:  MDS (myelodysplastic syndrome) Unfortunately, peripheral smear revealed 3% blasts with presence of Auer Rods. 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 Bone marrow biopsy is indicated urgently. The patient wanted a sedated bone marrow biopsy and the earliest would be next  Thursday. I spoke with a physician at [facility] and we'll try to refer him to be seen there as soon as possible. There might be a possibility of having a bone marrow biopsy done at [facility] at the same time.   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 The patient denies recent history of bleeding such as epistaxis, hematuria or hematochezia. He is asymptomatic from the anemia. We will observe for now.  He does not require transfusion now.    Thrombocytopenia, unspecified He is not symptomatic. I will observe.      All questions were answered. The patient knows to call the clinic with any problems, questions or concerns. I spent 55 minutes counseling the  patient face to face. The total time spent in the appointment was 60 minutes and more than 50% was on counseling.     [facility], [place], MD 09/04/2013 1:36 PM

## 2013-09-04 NOTE — Progress Notes (Signed)
Checked in new patient with no issues prior to seeing the dr. He has appt card and [place] been [place] of the country.. No calls to home----he doesn't now want any [place] calls. So only call wife in case of emergency.

## 2013-09-04 NOTE — Assessment & Plan Note (Signed)
Unfortunately, peripheral smear revealed 3% blasts with presence of Auer Rods. 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 Bone marrow biopsy is indicated urgently. The patient wanted a sedated bone marrow biopsy and the earliest would be next Thursday. I spoke with a physician at [facility] and we'll try to refer him to be seen there as soon as possible. There might be a possibility of having a bone marrow biopsy done at [facility] at the same time.

## 2013-09-04 NOTE — Telephone Encounter (Signed)
Sent records to [facility] Patient Coordinator at [facility].

## 2013-09-04 NOTE — Assessment & Plan Note (Signed)
This is likely anemia of related to the underlying bone marrow disease.. The patient denies recent history of bleeding such as epistaxis, hematuria or hematochezia. He is asymptomatic from the anemia. We will observe for now.  He does not require transfusion now.

## 2013-09-04 NOTE — Telephone Encounter (Signed)
[facility] PT APPT SCHEDULE FOR AUG

## 2013-09-04 NOTE — Assessment & Plan Note (Signed)
He is not symptomatic. I will observe.

## 2013-09-05 ENCOUNTER — Telehealth: Payer: Self-pay | Admitting: Family Medicine

## 2013-09-05 ENCOUNTER — Other Ambulatory Visit: Payer: Self-pay | Admitting: *Deleted

## 2013-09-05 ENCOUNTER — Telehealth: Payer: Self-pay | Admitting: *Deleted

## 2013-09-05 DIAGNOSIS — E109 Type 1 diabetes mellitus without complications: Secondary | ICD-10-CM

## 2013-09-05 LAB — DIRECT ANTIGLOBULIN TEST (NOT AT ARMC)
DAT (Complement): NEGATIVE
DAT IgG: NEGATIVE

## 2013-09-05 LAB — FIBRINOGEN: Fibrinogen: 82 mg/dL — ABNORMAL LOW (ref 204–475)

## 2013-09-05 MED ORDER — "INSULIN SYRINGE 29G X 1/2"" 0.5 ML MISC": Status: DC

## 2013-09-05 MED ORDER — GLUCOSE BLOOD VI STRP: ORAL_STRIP | Status: DC

## 2013-09-05 MED ORDER — INSULIN LISPRO PROT & LISPRO (75-25 MIX) 100 UNIT/ML ~~LOC~~ SUSP: 15.0000 [IU] | Freq: Every day | SUBCUTANEOUS | Status: DC

## 2013-09-05 NOTE — Telephone Encounter (Signed)
Pt called to state he has not heard about appt at [facility] yet.  Called [facility] new pt coordinator phone [phone] and she states will call pt w/ appt soon.   Called pt to inform him that they will be calling him soon.  He verbalized understanding.

## 2013-09-05 NOTE — Telephone Encounter (Signed)
Pt wife request a call back they had issues with his rx being refill. Wife said he did not received 90 day supply as he usually does and he only got one

## 2013-09-05 NOTE — Telephone Encounter (Signed)
Pt called back to notify that his appt at [facility] is not until Aug 12.  He was under the impression it was more urgent than that.  He said he asked the scheduler if it can be any sooner and scheduler is supposed to call him back.  Notified Dr. Alvy Bimler.

## 2013-09-05 NOTE — Telephone Encounter (Signed)
Left message on machine returning patient's call 

## 2013-09-05 NOTE — Telephone Encounter (Signed)
Pt called again to notify he has not heard back from [facility] about moving his appt up.  Informed him that Dr. Alvy Bimler is aware and we will keep his appt for [facility] here on 7/30 as scheduled.  Please let us know if his appt is moved up.  He verbalized understanding.

## 2013-09-08 ENCOUNTER — Encounter (HOSPITAL_COMMUNITY): Payer: Self-pay | Admitting: Pharmacy Technician

## 2013-09-08 ENCOUNTER — Ambulatory Visit: Payer: Managed Care, Other (non HMO) | Admitting: Family Medicine

## 2013-09-09 ENCOUNTER — Telehealth: Payer: Self-pay | Admitting: *Deleted

## 2013-09-09 ENCOUNTER — Ambulatory Visit: Payer: Managed Care, Other (non HMO) | Admitting: Cardiology

## 2013-09-09 NOTE — Telephone Encounter (Signed)
Pt left a voice mail stating he heard from [facility] and he will be able to start treatment on 09/22/13. Is to have bone marrow biopsy here 09/11/13

## 2013-09-10 ENCOUNTER — Other Ambulatory Visit: Payer: Self-pay | Admitting: Hematology and Oncology

## 2013-09-10 DIAGNOSIS — D61818 Other pancytopenia: Secondary | ICD-10-CM

## 2013-09-11 ENCOUNTER — Ambulatory Visit (HOSPITAL_COMMUNITY)
Admission: RE | Admit: 2013-09-11 | Discharge: 2013-09-11 | Disposition: A | Discharge status: Home / Self Care | Payer: Managed Care, Other (non HMO) | Source: Ambulatory Visit | Attending: Hematology and Oncology | Admitting: Hematology and Oncology

## 2013-09-11 ENCOUNTER — Encounter (HOSPITAL_COMMUNITY): Payer: Self-pay

## 2013-09-11 ENCOUNTER — Telehealth: Payer: Self-pay | Admitting: Hematology and Oncology

## 2013-09-11 VITALS — BP 120/72 | HR 72 | Temp 98.4°F | Resp 18 | Ht 68.25 in | Wt 205.0 lb

## 2013-09-11 DIAGNOSIS — D696 Thrombocytopenia, unspecified: Secondary | ICD-10-CM | POA: Insufficient documentation

## 2013-09-11 DIAGNOSIS — D61818 Other pancytopenia: Secondary | ICD-10-CM

## 2013-09-11 DIAGNOSIS — C9200 Acute myeloblastic leukemia, not having achieved remission: Secondary | ICD-10-CM | POA: Insufficient documentation

## 2013-09-11 DIAGNOSIS — D72821 Monocytosis (symptomatic): Secondary | ICD-10-CM | POA: Insufficient documentation

## 2013-09-11 DIAGNOSIS — D539 Nutritional anemia, unspecified: Secondary | ICD-10-CM | POA: Insufficient documentation

## 2013-09-11 LAB — PROTIME-INR
INR: 1.12 (ref 0.00–1.49)
Prothrombin Time: 14.4 seconds (ref 11.6–15.2)

## 2013-09-11 LAB — CBC WITH DIFFERENTIAL/PLATELET
BASOS PCT: 0 % (ref 0–1)
Basophils Absolute: 0 10*3/uL (ref 0.0–0.1)
Eosinophils Absolute: 0 10*3/uL (ref 0.0–0.7)
Eosinophils Relative: 0 % (ref 0–5)
HCT: 31.7 % — ABNORMAL LOW (ref 39.0–52.0)
Hemoglobin: 11.2 g/dL — ABNORMAL LOW (ref 13.0–17.0)
Lymphocytes Relative: 27 % (ref 12–46)
Lymphs Abs: 1.3 10*3/uL (ref 0.7–4.0)
MCH: 37.1 pg — ABNORMAL HIGH (ref 26.0–34.0)
MCHC: 35.3 g/dL (ref 30.0–36.0)
MCV: 105 fL — AB (ref 78.0–100.0)
MONO ABS: 1.3 10*3/uL — AB (ref 0.1–1.0)
Monocytes Relative: 27 % — ABNORMAL HIGH (ref 3–12)
NEUTROS PCT: 46 % (ref 43–77)
Neutro Abs: 2.2 10*3/uL (ref 1.7–7.7)
Platelets: 78 10*3/uL — ABNORMAL LOW (ref 150–400)
RBC: 3.02 MIL/uL — AB (ref 4.22–5.81)
RDW: 15.7 % — ABNORMAL HIGH (ref 11.5–15.5)
WBC: 4.8 10*3/uL (ref 4.0–10.5)

## 2013-09-11 LAB — BONE MARROW EXAM

## 2013-09-11 LAB — GLUCOSE, CAPILLARY: GLUCOSE-CAPILLARY: 116 mg/dL — AB (ref 70–99)

## 2013-09-11 LAB — URIC ACID: Uric Acid, Serum: 6 mg/dL (ref 4.0–7.8)

## 2013-09-11 LAB — APTT: aPTT: 36 seconds (ref 24–37)

## 2013-09-11 LAB — FIBRINOGEN: FIBRINOGEN: 306 mg/dL (ref 204–475)

## 2013-09-11 MED ORDER — SODIUM CHLORIDE 0.9 % IV SOLN
Freq: Once | INTRAVENOUS | Status: AC
  Administered 2013-09-11: 500 mL via INTRAVENOUS

## 2013-09-11 MED ORDER — FENTANYL CITRATE 0.05 MG/ML IJ SOLN
100.0000 ug | Freq: Once | INTRAMUSCULAR | Status: DC
  Filled 2013-09-11: qty 2

## 2013-09-11 MED ORDER — MIDAZOLAM HCL 10 MG/2ML IJ SOLN
10.0000 mg | Freq: Once | INTRAMUSCULAR | Status: DC
  Filled 2013-09-11: qty 2

## 2013-09-11 MED ORDER — MIDAZOLAM HCL 5 MG/5ML IJ SOLN
INTRAMUSCULAR | Status: AC | PRN
  Administered 2013-09-11: 1 mg via INTRAVENOUS
  Administered 2013-09-11 (×2): 2 mg via INTRAVENOUS

## 2013-09-11 MED ORDER — FENTANYL CITRATE 0.05 MG/ML IJ SOLN
INTRAMUSCULAR | Status: AC | PRN
  Administered 2013-09-11 (×2): 25 ug via INTRAVENOUS

## 2013-09-11 NOTE — Discharge Instructions (Signed)
Do not drive  For 24 hours Do not go into public places today May resume your regular diet and take home medications as usual May experience small amount of tingling in leg (biopsy side) May take shower and remove bandage in am For any questions or concerns, call Dr Alvy Bimler If bleeding occurs at site, hold pressure x10 minutes  If continues, call Dr Alvy Bimler [phone] Bone Marrow Aspiration, Bone Marrow Biopsy Care After Read the instructions outlined below and refer to this sheet in the next few weeks. These discharge instructions provide you with general information on caring for yourself after you leave the hospital. Your caregiver may also give you specific instructions. While your treatment has been planned according to the most current medical practices available, unavoidable complications occasionally occur. If you have any problems or questions after discharge, call your caregiver. FINDING OUT THE RESULTS OF YOUR TEST Not all test results are available during your visit. If your test results are not back during the visit, make an appointment with your caregiver to find out the results. Do not assume everything is normal if you have not heard from your caregiver or the medical facility. It is important for you to follow up on all of your test results.  HOME CARE INSTRUCTIONS  You have had sedation and may be sleepy or dizzy. Your thinking may not be as clear as usual. For the next 24 hours:  Only take over-the-counter or prescription medicines for pain, discomfort, and or fever as directed by your caregiver.  Do not drink alcohol.  Do not smoke.  Do not drive.  Do not make important legal decisions.  Do not operate heavy machinery.  Do not care for small children by yourself.  Keep your dressing clean and dry. You may replace dressing with a bandage after 24 hours.  You may take a bath or shower after 24 hours.  Use an ice pack for 20 minutes every 2 hours while awake for pain  as needed. SEEK MEDICAL CARE IF:   There is redness, swelling, or increasing pain at the biopsy site.  There is pus coming from the biopsy site.  There is drainage from a biopsy site lasting longer than one day.  An unexplained oral temperature above 102 F (38.9 C) develops. SEEK IMMEDIATE MEDICAL CARE IF:   You develop a rash.  You have difficulty breathing.  You develop any reaction or side effects to medications given. Document Released: 08/19/2004 Document Revised: 04/24/2011 Document Reviewed: 01/28/2008 [facility] Patient Information 2015 [place], [place]. This information is not intended to replace advice given to you by your health care provider. Make sure you discuss any questions you have with your health care provider. Conscious Sedation Sedation is the use of medicines to promote relaxation and relieve discomfort and anxiety. Conscious sedation is a type of sedation. Under conscious sedation you are less alert than normal but are still able to respond to instructions or stimulation. Conscious sedation is used during short medical and dental procedures. It is milder than deep sedation or general anesthesia and allows you to return to your regular activities sooner.  LET [facility] CARE PROVIDER KNOW ABOUT:   Any allergies you have.  All medicines you are taking, including vitamins, herbs, eye drops, creams, and over-the-counter medicines.  Use of steroids (by mouth or creams).  Previous problems you or members of your family have had with the use of anesthetics.  Any blood disorders you have.  Previous surgeries you have had.  Medical conditions you have.  Possibility of pregnancy, if this applies.  Use of cigarettes, alcohol, or illegal drugs. RISKS AND COMPLICATIONS Generally, this is a safe procedure. However, as with any procedure, problems can occur. Possible problems include:  Oversedation.  Trouble breathing on your own. You may need to have a breathing  tube until you are awake and breathing on your own.  Allergic reaction to any of the medicines used for the procedure. BEFORE THE PROCEDURE  You may have blood tests done. These tests can help show how well your kidneys and liver are working. They can also show how well your blood clots.  A physical exam will be done.  Only take medicines as directed by your health care provider. You may need to stop taking medicines (such as blood thinners, aspirin, or nonsteroidal anti-inflammatory drugs) before the procedure.   Do not eat or drink at least 6 hours before the procedure or as directed by your health care provider.  Arrange for a responsible adult, family member, or friend to take you home after the procedure. He or she should stay with you for at least 24 hours after the procedure, until the medicine has worn off. PROCEDURE   An intravenous (IV) catheter will be inserted into one of your veins. Medicine will be able to flow directly into your body through this catheter. You may be given medicine through this tube to help prevent pain and help you relax.  The medical or dental procedure will be done. AFTER THE PROCEDURE  You will stay in a recovery area until the medicine has worn off. Your blood pressure and pulse will be checked.   Depending on the procedure you had, you may be allowed to go home when you can tolerate liquids and your pain is under control. Document Released: 10/25/2000 Document Revised: 02/04/2013 Document Reviewed: 10/07/2012 [facility] Patient Information 2015 [place], [place]. This information is not intended to replace advice given to you by your health care provider. Make sure you discuss any questions you have with your health care provider.

## 2013-09-11 NOTE — Sedation Documentation (Signed)
Patient denies pain and is resting comfortably. Ambulated in room with no assist. noc hanged in spot of blood on bandage. Pt is ready for discharge

## 2013-09-11 NOTE — Sedation Documentation (Signed)
Procedure ends. Bandaid placed to left post iliac area with dime sized spot on pad. Pt assisted to supine postion

## 2013-09-11 NOTE — Sedation Documentation (Signed)
Family updated as to patient's status.

## 2013-09-11 NOTE — Telephone Encounter (Signed)
I spoke with Dr. Nadara Mustard from [facility] and informed her about the preliminary diagnosis of acute leukemia. I was informed by the pathologist that flow cytometry detected 33% blasts from the bone marrow aspirate. Dr. Nadara Mustard has graciously except that the patient to be seen sooner I will try to arrange him to be seen at 2 PM tomorrow. I informed the patient that he needs to swing by my office for information, copies of his test results and progress notes and also to bring the bone marrow slides with him to [facility] tomorrow.

## 2013-09-11 NOTE — Sedation Documentation (Signed)
Medication dose calculated and verified for: FENTANYL 50 mcg and VERSED 5 mg IV

## 2013-09-11 NOTE — Procedures (Signed)
Brief examination was performed. ENT: adequate airway clearance Heart: regular rate and rhythm.No Murmurs Lungs: clear to auscultation, no wheezes, normal respiratory effort  American Society of Anesthesiologists ASA scale 1  Mallampati Score of 3  Bone Marrow Biopsy and Aspiration Procedure Note   Informed consent was obtained and potential risks including bleeding, infection and pain were reviewed with the patient. I verified that the patient has been fasting since midnight.  The patient's name, date of birth, identification, consent and allergies were verified prior to the start of procedure and time out was performed.  A total of 5 mg of IV Versed and 50 mcg of IV fentanyl were given.  The right posterior iliac crest was chosen as the site of biopsy.  The skin was prepped with Betadine solution.   8 cc of 1% lidocaine was used to provide local anaesthesia.   10 cc of bone marrow aspirate was obtained followed by 1 inch biopsy.   The procedure was tolerated well and there were no complications.  The patient was stable at the end of the procedure.  Specimens sent for flow cytometry, cytogenetics and additional studies.  

## 2013-09-11 NOTE — Sedation Documentation (Signed)
Patient is resting comfortably. 

## 2013-09-11 NOTE — Sedation Documentation (Signed)
Patient denies pain and is resting comfortably.  

## 2013-09-11 NOTE — Sedation Documentation (Signed)
bandaid post iliac area unchanged. Still dime sized spot of blood on pad no oozing of brusing

## 2013-09-12 ENCOUNTER — Encounter: Payer: Self-pay | Admitting: *Deleted

## 2013-09-12 NOTE — Progress Notes (Signed)
Pt came by clinic and picked up Path slides and reports from RN to hand carry to his appt at [facility] this afternoon.  Printed directions to [place] and map of campus/ cancer center and parking also given to pt.Marland Kitchen He verbalized understanding of directions.

## 2013-09-19 ENCOUNTER — Ambulatory Visit: Payer: Managed Care, Other (non HMO) | Admitting: Hematology and Oncology

## 2013-09-22 LAB — CHROMOSOME ANALYSIS, BONE MARROW

## 2013-09-22 LAB — TISSUE HYBRIDIZATION (BONE MARROW)-NCBH

## 2013-10-02 ENCOUNTER — Encounter: Payer: Self-pay | Admitting: Hematology and Oncology

## 2013-10-30 DIAGNOSIS — I808 Phlebitis and thrombophlebitis of other sites: Secondary | ICD-10-CM | POA: Insufficient documentation

## 2013-11-07 ENCOUNTER — Telehealth: Payer: Self-pay | Admitting: Family Medicine

## 2013-11-07 NOTE — Telephone Encounter (Signed)
Vaccine record faxed and confirmed

## 2013-11-07 NOTE — Telephone Encounter (Signed)
Pt called said he need a list of vaccination to be faxed over to his cancer doctor. He did not have fax number or first name of doctor. I told him we will need this information. He said when you call him he will have this information .

## 2013-11-13 ENCOUNTER — Telehealth: Payer: Self-pay | Admitting: *Deleted

## 2013-11-13 NOTE — Telephone Encounter (Signed)
[place] left a voice mail stating Mr Upperman is at [facility] for 6 days receiving chemo. Would like him to have labs here every Monday and Thursday- starting Thursday 10/8 with a neulasta injection. Stated she could fax orders.   I left a message for her with our phone number and fax number to send orders.

## 2013-11-14 ENCOUNTER — Other Ambulatory Visit: Payer: Self-pay | Admitting: *Deleted

## 2013-11-14 ENCOUNTER — Encounter: Payer: Self-pay | Admitting: *Deleted

## 2013-11-14 ENCOUNTER — Encounter: Payer: Self-pay | Admitting: Hematology and Oncology

## 2013-11-14 ENCOUNTER — Other Ambulatory Visit: Payer: Self-pay | Admitting: Hematology and Oncology

## 2013-11-14 DIAGNOSIS — C9201 Acute myeloblastic leukemia, in remission: Secondary | ICD-10-CM

## 2013-11-14 HISTORY — DX: Acute myeloblastic leukemia, in remission: C92.01

## 2013-11-18 ENCOUNTER — Telehealth: Payer: Self-pay | Admitting: Hematology and Oncology

## 2013-11-18 NOTE — Telephone Encounter (Signed)
s/w pt re appt for 10/7 @ 1:45pm. pt will get new scheduled 10/7. appt w/NG scheduled for 2:15pm due to 2pm not available.

## 2013-11-19 ENCOUNTER — Ambulatory Visit: Payer: Managed Care, Other (non HMO)

## 2013-11-19 ENCOUNTER — Other Ambulatory Visit (HOSPITAL_BASED_OUTPATIENT_CLINIC_OR_DEPARTMENT_OTHER): Payer: Managed Care, Other (non HMO)

## 2013-11-19 ENCOUNTER — Ambulatory Visit (HOSPITAL_BASED_OUTPATIENT_CLINIC_OR_DEPARTMENT_OTHER): Payer: Managed Care, Other (non HMO) | Admitting: Hematology and Oncology

## 2013-11-19 ENCOUNTER — Telehealth: Payer: Self-pay | Admitting: Hematology and Oncology

## 2013-11-19 VITALS — BP 142/72 | HR 79 | Temp 98.2°F | Resp 18 | Ht 68.75 in | Wt 179.3 lb

## 2013-11-19 DIAGNOSIS — C9201 Acute myeloblastic leukemia, in remission: Secondary | ICD-10-CM

## 2013-11-19 DIAGNOSIS — T50905A Adverse effect of unspecified drugs, medicaments and biological substances, initial encounter: Secondary | ICD-10-CM

## 2013-11-19 DIAGNOSIS — D630 Anemia in neoplastic disease: Secondary | ICD-10-CM

## 2013-11-19 DIAGNOSIS — D6959 Other secondary thrombocytopenia: Secondary | ICD-10-CM

## 2013-11-19 LAB — COMPREHENSIVE METABOLIC PANEL (CC13)
ALBUMIN: 3.3 g/dL — AB (ref 3.5–5.0)
ALT: 15 U/L (ref 0–55)
AST: 18 U/L (ref 5–34)
Alkaline Phosphatase: 78 U/L (ref 40–150)
Anion Gap: 7 mEq/L (ref 3–11)
BUN: 14.5 mg/dL (ref 7.0–26.0)
CALCIUM: 9.8 mg/dL (ref 8.4–10.4)
CHLORIDE: 104 meq/L (ref 98–109)
CO2: 29 mEq/L (ref 22–29)
Creatinine: 0.8 mg/dL (ref 0.7–1.3)
Glucose: 106 mg/dl (ref 70–140)
Potassium: 4.5 mEq/L (ref 3.5–5.1)
SODIUM: 141 meq/L (ref 136–145)
TOTAL PROTEIN: 7.3 g/dL (ref 6.4–8.3)
Total Bilirubin: 1.02 mg/dL (ref 0.20–1.20)

## 2013-11-19 LAB — CBC WITH DIFFERENTIAL/PLATELET
BASO%: 0.4 % (ref 0.0–2.0)
Basophils Absolute: 0 10*3/uL (ref 0.0–0.1)
EOS%: 0.8 % (ref 0.0–7.0)
Eosinophils Absolute: 0 10*3/uL (ref 0.0–0.5)
HCT: 30.6 % — ABNORMAL LOW (ref 38.4–49.9)
HEMOGLOBIN: 10.2 g/dL — AB (ref 13.0–17.1)
LYMPH%: 2.7 % — ABNORMAL LOW (ref 14.0–49.0)
MCH: 30.2 pg (ref 27.2–33.4)
MCHC: 33.2 g/dL (ref 32.0–36.0)
MCV: 90.9 fL (ref 79.3–98.0)
MONO#: 0 10*3/uL — ABNORMAL LOW (ref 0.1–0.9)
MONO%: 0.8 % (ref 0.0–14.0)
NEUT#: 4 10*3/uL (ref 1.5–6.5)
NEUT%: 95.3 % — ABNORMAL HIGH (ref 39.0–75.0)
Platelets: 87 10*3/uL — ABNORMAL LOW (ref 140–400)
RBC: 3.37 10*6/uL — ABNORMAL LOW (ref 4.20–5.82)
RDW: 18.5 % — ABNORMAL HIGH (ref 11.0–14.6)
WBC: 4.2 10*3/uL (ref 4.0–10.3)
lymph#: 0.1 10*3/uL — ABNORMAL LOW (ref 0.9–3.3)

## 2013-11-19 LAB — HOLD TUBE, BLOOD BANK

## 2013-11-19 LAB — MAGNESIUM (CC13): Magnesium: 2.2 mg/dl (ref 1.5–2.5)

## 2013-11-19 MED ORDER — PEGFILGRASTIM INJECTION 6 MG/0.6ML
6.0000 mg | Freq: Once | SUBCUTANEOUS | Status: AC
  Administered 2013-11-19: 6 mg via SUBCUTANEOUS
  Filled 2013-11-19: qty 0.6

## 2013-11-19 NOTE — Assessment & Plan Note (Signed)
This is likely due to recent treatment. The patient denies recent history of bleeding such as epistaxis, hematuria or hematochezia. He is asymptomatic from the low platelet count. I will observe for now.   Per protocol/instrucion from [facility] will transfuse 1 unit of irradiated platelets if platelet count is less than 10,000 or bleeding.

## 2013-11-19 NOTE — Progress Notes (Signed)
Neulasta injection given by nurse during MD visit.

## 2013-11-19 NOTE — Progress Notes (Signed)
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 He is seen for supportive care visits after recent high dose consolidation chemotherapy. He is doing well.  REVIEW OF SYSTEMS:   Constitutional: Denies fevers, chills or abnormal weight loss Eyes: Denies blurriness of vision Ears, nose, mouth, throat, and face: Denies mucositis or sore throat Respiratory: Denies cough, dyspnea or wheezes Cardiovascular: Denies palpitation, chest discomfort or lower extremity swelling Gastrointestinal:  Denies nausea, heartburn or change in bowel habits Skin: Denies abnormal skin rashes Lymphatics: Denies new lymphadenopathy or easy bruising Neurological:Denies numbness, tingling or new weaknesses Behavioral/Psych: Mood is stable, no new changes  All other systems were reviewed with the patient and are negative.  I have reviewed the past medical history, past surgical history, social history and family history with the patient and they are unchanged from previous note.  ALLERGIES:  is allergic to morphine and related.  MEDICATIONS:  Current Outpatient Prescriptions  Medication Sig Dispense Refill  . atenolol (TENORMIN) 25 MG tablet Take 12.5 mg by mouth every morning.      . fluconazole (DIFLUCAN) 200 MG tablet Take 200 mg by mouth daily.      Marland Kitchen glucose blood  (FREESTYLE LITE) test strip Test once daily dx 250.00  100 each  3  . insulin lispro protamine-lispro (HUMALOG 75/25 MIX) (75-25) 100 UNIT/ML SUSP injection Inject 15 Units into the skin at bedtime.  3 vial  11  . INSULIN SYRINGE .5CC/29G (B-D INSULIN SYRINGE) 29G X 1/2" 0.5 ML MISC Use once daily, dx 250.00  100 each  3  . levofloxacin (LEVAQUIN) 500 MG tablet Take 500 mg by mouth daily.      . metFORMIN (GLUCOPHAGE) 500 MG tablet Take 1 tablet (500 mg total) by mouth 2 (two) times daily with a meal.  200 tablet  3  . simvastatin (ZOCOR) 20 MG tablet Take 1 tablet (20 mg total) by mouth at bedtime.  100 tablet  3   No current facility-administered medications for this visit.    PHYSICAL EXAMINATION: ECOG PERFORMANCE STATUS: 1 - Symptomatic but completely ambulatory  Filed Vitals:   11/19/13 1411  BP: 142/72  Pulse: 79  Temp: 98.2 F (36.8 C)  Resp: 18   Filed Weights   11/19/13 1411  Weight: 179 lb 4.8 oz (81.33 kg)    GENERAL:alert, no distress and comfortable SKIN: skin color, texture, turgor are normal, no rashes or significant lesions. Old bruises are noted EYES: normal, Conjunctiva are pale and non-injected, sclera clear OROPHARYNX:no exudate, no erythema and lips, buccal mucosa, and tongue normal  NECK: supple, thyroid normal size, non-tender, without nodularity LYMPH:  no palpable lymphadenopathy in the cervical, axillary or inguinal LUNGS: clear to auscultation and percussion with normal breathing effort HEART: regular rate & rhythm and no murmurs and no lower extremity edema ABDOMEN:abdomen soft, non-tender and normal bowel sounds Musculoskeletal:no cyanosis of digits  and no clubbing  NEURO: alert & oriented x 3 with fluent speech, no focal motor/sensory deficits  LABORATORY DATA:  I have reviewed the data as listed    Component Value Date/Time   NA 141 11/19/2013 1348   NA 139 08/22/2013 0758   K 4.5 11/19/2013 1348   K 3.9 08/22/2013 0758   CL 104 08/22/2013 0758    CO2 29 11/19/2013 1348   CO2 22 08/22/2013 0758   GLUCOSE 106 11/19/2013 1348   GLUCOSE 120* 08/22/2013 0758   BUN 14.5 11/19/2013 1348   BUN 19 08/22/2013 0758   CREATININE 0.8 11/19/2013 1348   CREATININE 0.9 08/22/2013 0758   CALCIUM 9.8 11/19/2013 1348   CALCIUM 8.8 08/22/2013 0758   PROT 7.3 11/19/2013 1348   PROT 6.6 08/22/2013 0758   ALBUMIN 3.3* 11/19/2013 1348   ALBUMIN 3.8 08/22/2013 0758   AST 18 11/19/2013 1348   AST 23 08/22/2013 0758   ALT 15 11/19/2013 1348   ALT 17 08/22/2013 0758   ALKPHOS 78 11/19/2013 1348   ALKPHOS 41 08/22/2013 0758   BILITOT 1.02 11/19/2013 1348   BILITOT 0.8 08/22/2013 0758   GFRNONAA 71.09 01/24/2010 0753   GFRAA 78 01/20/2008 0755    No results found for this basename: SPEP, UPEP,  kappa and lambda light chains    Lab Results  Component Value Date   WBC 4.2 11/19/2013   NEUTROABS 4.0 11/19/2013   HGB 10.2* 11/19/2013   HCT 30.6* 11/19/2013   MCV 90.9 11/19/2013   PLT 87* 11/19/2013      Chemistry      Component Value Date/Time   NA 141 11/19/2013 1348   NA 139 08/22/2013 0758   K 4.5 11/19/2013 1348   K 3.9 08/22/2013 0758   CL 104 08/22/2013 0758   CO2 29 11/19/2013 1348   CO2 22 08/22/2013 0758   BUN 14.5 11/19/2013 1348   BUN 19 08/22/2013 0758   CREATININE 0.8 11/19/2013 1348   CREATININE 0.9 08/22/2013 0758      Component Value Date/Time   CALCIUM 9.8 11/19/2013 1348   CALCIUM 8.8 08/22/2013 0758   ALKPHOS 78 11/19/2013 1348   ALKPHOS 41 08/22/2013 0758   AST 18 11/19/2013 1348   AST 23 08/22/2013 0758   ALT 15 11/19/2013 1348   ALT 17 08/22/2013 0758   BILITOT 1.02 11/19/2013 1348   BILITOT 0.8 08/22/2013 0758     ASSESSMENT & PLAN:  AML (acute myeloid leukemia) in remission He is in remission. Continue aggressive supportive care with Neulasta injection today for reduce risk of neutropenic fever. He is on anti-microbial prophylaxis per protocol. I will bring him in 3 times a week for blood work and transfusion support  Anemia in neoplastic  disease This is likely due to recent treatment. The patient denies recent history of bleeding such as epistaxis, hematuria or hematochezia. He is asymptomatic from the anemia. I will observe for now.   Per protocol/instrucion from [facility] will transfuse 2 units of irradiated blood if hemoglobin is less than 8 g.  Thrombocytopenia due to drugs This is likely due to recent treatment. The patient denies recent history of bleeding such as epistaxis, hematuria or hematochezia. He is asymptomatic from the low platelet count. I will observe for now.   Per protocol/instrucion from [facility] will transfuse 1 unit of irradiated platelets if platelet count is less than 10,000 or bleeding.   No orders of the defined types were placed in this encounter.  All questions were answered. The patient knows to call the clinic with any problems, questions or concerns. No barriers to learning was detected. I spent 30 minutes counseling the patient face to face. The total time spent in the appointment was 40 minutes and more than 50% was on counseling and review of test results     [facility], [place], MD 11/19/2013 8:01 PM

## 2013-11-19 NOTE — Assessment & Plan Note (Signed)
This is likely due to recent treatment. The patient denies recent history of bleeding such as epistaxis, hematuria or hematochezia. He is asymptomatic from the anemia. I will observe for now.   Per protocol/instrucion from [facility] will transfuse 2 units of irradiated blood if hemoglobin is less than 8 g.  

## 2013-11-19 NOTE — Assessment & Plan Note (Signed)
He is in remission. Continue aggressive supportive care with Neulasta injection today for reduce risk of neutropenic fever. He is on anti-microbial prophylaxis per protocol. I will bring him in 3 times a week for blood work and transfusion support

## 2013-11-19 NOTE — Telephone Encounter (Signed)
no new pof sent 11/19/13. pt given schedule for existing lab appts [facility] thru end of oct.

## 2013-11-21 ENCOUNTER — Other Ambulatory Visit (HOSPITAL_BASED_OUTPATIENT_CLINIC_OR_DEPARTMENT_OTHER): Payer: Managed Care, Other (non HMO)

## 2013-11-21 ENCOUNTER — Telehealth: Payer: Self-pay | Admitting: *Deleted

## 2013-11-21 ENCOUNTER — Encounter: Payer: Self-pay | Admitting: *Deleted

## 2013-11-21 DIAGNOSIS — C9201 Acute myeloblastic leukemia, in remission: Secondary | ICD-10-CM

## 2013-11-21 LAB — COMPREHENSIVE METABOLIC PANEL (CC13)
ALT: 13 U/L (ref 0–55)
ANION GAP: 10 meq/L (ref 3–11)
AST: 16 U/L (ref 5–34)
Albumin: 3.3 g/dL — ABNORMAL LOW (ref 3.5–5.0)
Alkaline Phosphatase: 78 U/L (ref 40–150)
BILIRUBIN TOTAL: 1.14 mg/dL (ref 0.20–1.20)
BUN: 17.8 mg/dL (ref 7.0–26.0)
CO2: 26 mEq/L (ref 22–29)
CREATININE: 0.9 mg/dL (ref 0.7–1.3)
Calcium: 9.7 mg/dL (ref 8.4–10.4)
Chloride: 106 mEq/L (ref 98–109)
Glucose: 196 mg/dl — ABNORMAL HIGH (ref 70–140)
Potassium: 4.2 mEq/L (ref 3.5–5.1)
Sodium: 142 mEq/L (ref 136–145)
Total Protein: 7.2 g/dL (ref 6.4–8.3)

## 2013-11-21 LAB — CBC WITH DIFFERENTIAL/PLATELET
BASO%: 0.4 % (ref 0.0–2.0)
Basophils Absolute: 0 10*3/uL (ref 0.0–0.1)
EOS%: 0.2 % (ref 0.0–7.0)
Eosinophils Absolute: 0 10*3/uL (ref 0.0–0.5)
HEMATOCRIT: 28.6 % — AB (ref 38.4–49.9)
HGB: 9.4 g/dL — ABNORMAL LOW (ref 13.0–17.1)
LYMPH%: 3.5 % — AB (ref 14.0–49.0)
MCH: 30.1 pg (ref 27.2–33.4)
MCHC: 33 g/dL (ref 32.0–36.0)
MCV: 91.2 fL (ref 79.3–98.0)
MONO#: 0 10*3/uL — AB (ref 0.1–0.9)
MONO%: 0.4 % (ref 0.0–14.0)
NEUT#: 5 10*3/uL (ref 1.5–6.5)
NEUT%: 95.5 % — AB (ref 39.0–75.0)
PLATELETS: 42 10*3/uL — AB (ref 140–400)
RBC: 3.13 10*6/uL — ABNORMAL LOW (ref 4.20–5.82)
RDW: 18 % — ABNORMAL HIGH (ref 11.0–14.6)
WBC: 5.2 10*3/uL (ref 4.0–10.3)
lymph#: 0.2 10*3/uL — ABNORMAL LOW (ref 0.9–3.3)

## 2013-11-21 LAB — MAGNESIUM (CC13): Magnesium: 2 mg/dl (ref 1.5–2.5)

## 2013-11-21 LAB — HOLD TUBE, BLOOD BANK

## 2013-11-21 NOTE — Telephone Encounter (Signed)
Copy of HCPOA and Living Will brought in by pt today.  Copies made and given to HIM to enter into EMR.

## 2013-11-21 NOTE — Telephone Encounter (Signed)
Pt informed no need for transfusion today.  Labs faxed to [facility] at fax [phone].

## 2013-11-24 ENCOUNTER — Non-Acute Institutional Stay (HOSPITAL_COMMUNITY)
Admission: AD | Admit: 2013-11-24 | Discharge: 2013-11-24 | Disposition: A | Discharge status: Home / Self Care | Payer: Managed Care, Other (non HMO) | Source: Ambulatory Visit | Attending: Hematology and Oncology | Admitting: Hematology and Oncology

## 2013-11-24 ENCOUNTER — Other Ambulatory Visit: Payer: Self-pay | Admitting: Hematology and Oncology

## 2013-11-24 ENCOUNTER — Other Ambulatory Visit: Payer: Self-pay | Admitting: *Deleted

## 2013-11-24 ENCOUNTER — Other Ambulatory Visit (HOSPITAL_BASED_OUTPATIENT_CLINIC_OR_DEPARTMENT_OTHER): Payer: Managed Care, Other (non HMO)

## 2013-11-24 ENCOUNTER — Ambulatory Visit (HOSPITAL_COMMUNITY)
Admission: RE | Admit: 2013-11-24 | Discharge: 2013-11-24 | Disposition: A | Discharge status: Home / Self Care | Payer: Managed Care, Other (non HMO) | Source: Ambulatory Visit | Attending: Hematology and Oncology | Admitting: Hematology and Oncology

## 2013-11-24 DIAGNOSIS — C9201 Acute myeloblastic leukemia, in remission: Secondary | ICD-10-CM | POA: Insufficient documentation

## 2013-11-24 DIAGNOSIS — C9200 Acute myeloblastic leukemia, not having achieved remission: Secondary | ICD-10-CM

## 2013-11-24 DIAGNOSIS — D469 Myelodysplastic syndrome, unspecified: Secondary | ICD-10-CM | POA: Insufficient documentation

## 2013-11-24 DIAGNOSIS — D630 Anemia in neoplastic disease: Secondary | ICD-10-CM | POA: Insufficient documentation

## 2013-11-24 LAB — CBC WITH DIFFERENTIAL/PLATELET
BASO%: 0 % (ref 0.0–2.0)
Basophils Absolute: 0 10*3/uL (ref 0.0–0.1)
EOS ABS: 0 10*3/uL (ref 0.0–0.5)
EOS%: 2.9 % (ref 0.0–7.0)
HCT: 23.6 % — ABNORMAL LOW (ref 38.4–49.9)
HGB: 8.1 g/dL — ABNORMAL LOW (ref 13.0–17.1)
LYMPH#: 0.3 10*3/uL — AB (ref 0.9–3.3)
LYMPH%: 82.9 % — ABNORMAL HIGH (ref 14.0–49.0)
MCH: 30.1 pg (ref 27.2–33.4)
MCHC: 34.3 g/dL (ref 32.0–36.0)
MCV: 87.7 fL (ref 79.3–98.0)
MONO#: 0 10*3/uL — ABNORMAL LOW (ref 0.1–0.9)
MONO%: 2.9 % (ref 0.0–14.0)
NEUT%: 11.3 % — ABNORMAL LOW (ref 39.0–75.0)
NEUTROS ABS: 0 10*3/uL — AB (ref 1.5–6.5)
PLATELETS: 4 10*3/uL — AB (ref 140–400)
RBC: 2.69 10*6/uL — ABNORMAL LOW (ref 4.20–5.82)
RDW: 15.9 % — ABNORMAL HIGH (ref 11.0–14.6)
WBC: 0.4 10*3/uL — AB (ref 4.0–10.3)
nRBC: 0 % (ref 0–0)

## 2013-11-24 LAB — COMPREHENSIVE METABOLIC PANEL (CC13)
ALT: 14 U/L (ref 0–55)
ANION GAP: 11 meq/L (ref 3–11)
AST: 14 U/L (ref 5–34)
Albumin: 3.4 g/dL — ABNORMAL LOW (ref 3.5–5.0)
Alkaline Phosphatase: 80 U/L (ref 40–150)
BUN: 16.1 mg/dL (ref 7.0–26.0)
CALCIUM: 9.6 mg/dL (ref 8.4–10.4)
CO2: 24 meq/L (ref 22–29)
Chloride: 107 mEq/L (ref 98–109)
Creatinine: 0.9 mg/dL (ref 0.7–1.3)
Glucose: 137 mg/dl (ref 70–140)
POTASSIUM: 3.9 meq/L (ref 3.5–5.1)
SODIUM: 141 meq/L (ref 136–145)
TOTAL PROTEIN: 7.3 g/dL (ref 6.4–8.3)
Total Bilirubin: 1.01 mg/dL (ref 0.20–1.20)

## 2013-11-24 LAB — ABO/RH: ABO/RH(D): A POS

## 2013-11-24 LAB — MAGNESIUM (CC13): Magnesium: 2.2 mg/dl (ref 1.5–2.5)

## 2013-11-24 LAB — PREPARE RBC (CROSSMATCH)

## 2013-11-24 LAB — HOLD TUBE, BLOOD BANK

## 2013-11-24 MED ORDER — SODIUM CHLORIDE 0.9 % IJ SOLN: 10.0000 mL | INTRAMUSCULAR | Status: DC | PRN

## 2013-11-24 MED ORDER — FUROSEMIDE 10 MG/ML IJ SOLN
20.0000 mg | Freq: Once | INTRAMUSCULAR | Status: AC
  Administered 2013-11-24: 20 mg via INTRAVENOUS
  Filled 2013-11-24: qty 2

## 2013-11-24 MED ORDER — SODIUM CHLORIDE 0.9 % IV SOLN
250.0000 mL | Freq: Once | INTRAVENOUS | Status: AC
  Administered 2013-11-24: 250 mL via INTRAVENOUS

## 2013-11-24 NOTE — Procedures (Signed)
[facility]  Procedure Note  David Carr. GGY:[id number] DOB: 1945-02-22 DOA: 11/24/2013   PCP: Joycelyn Man, MD   Associated Diagnosis: Anemia in neoplastic disease   Procedure Note: Transfusion of 2 units PRBCs and 1 unit platelets   Condition During Procedure:  Pt tolerated well   Condition at Discharge:  Alert, oriented, ambulatory, no complications noted   Tamala Julian, Salley Slaughter, [facility]

## 2013-11-24 NOTE — H&P (Signed)
Patient is here for transfusion only

## 2013-11-25 LAB — TYPE AND SCREEN
ABO/RH(D): A POS
Antibody Screen: NEGATIVE
UNIT DIVISION: 0
Unit division: 0

## 2013-11-25 LAB — PREPARE PLATELET PHERESIS: UNIT DIVISION: 0

## 2013-11-26 ENCOUNTER — Telehealth: Payer: Self-pay | Admitting: *Deleted

## 2013-11-26 ENCOUNTER — Other Ambulatory Visit (HOSPITAL_BASED_OUTPATIENT_CLINIC_OR_DEPARTMENT_OTHER): Payer: Managed Care, Other (non HMO)

## 2013-11-26 DIAGNOSIS — C9201 Acute myeloblastic leukemia, in remission: Secondary | ICD-10-CM

## 2013-11-26 LAB — CBC WITH DIFFERENTIAL/PLATELET
BASO%: 0 % (ref 0.0–2.0)
BASOS ABS: 0 10*3/uL (ref 0.0–0.1)
EOS%: 3.7 % (ref 0.0–7.0)
Eosinophils Absolute: 0 10*3/uL (ref 0.0–0.5)
HCT: 27.7 % — ABNORMAL LOW (ref 38.4–49.9)
HEMOGLOBIN: 9.5 g/dL — AB (ref 13.0–17.1)
LYMPH%: 96.3 % — ABNORMAL HIGH (ref 14.0–49.0)
MCH: 29.8 pg (ref 27.2–33.4)
MCHC: 34.3 g/dL (ref 32.0–36.0)
MCV: 86.8 fL (ref 79.3–98.0)
MONO#: 0 10*3/uL — ABNORMAL LOW (ref 0.1–0.9)
MONO%: 0 % (ref 0.0–14.0)
NEUT#: 0 10*3/uL — CL (ref 1.5–6.5)
NEUT%: 0 % — ABNORMAL LOW (ref 39.0–75.0)
NRBC: 0 % (ref 0–0)
Platelets: 17 10*3/uL — ABNORMAL LOW (ref 140–400)
RBC: 3.19 10*6/uL — ABNORMAL LOW (ref 4.20–5.82)
RDW: 15.3 % — AB (ref 11.0–14.6)
WBC: 0.3 10*3/uL — CL (ref 4.0–10.3)
lymph#: 0.3 10*3/uL — ABNORMAL LOW (ref 0.9–3.3)

## 2013-11-26 LAB — HOLD TUBE, BLOOD BANK

## 2013-11-26 NOTE — Telephone Encounter (Signed)
Spoke w/ pt in lobby.  Informed of no need for transfusion today.  Gave copy of his lab results.  Pt reports feeling dizzy and light headed past few days especially in the mornings.  States taking Fluconazole and label says it can cause these symptoms.  He is taking on empty stomach, but has stomach ache after taking med in the morning.  Instructed pt to take med w/ food and to make sure he is drinking plenty of fluid.  If his symptoms continue,  Contact Dr. Lyman Speller office at [facility] as they prescribed this medication for pt.Marland Kitchen  He verbalized understanding. Faxed CBC result to [facility] at fax [phone].

## 2013-11-28 ENCOUNTER — Other Ambulatory Visit (HOSPITAL_BASED_OUTPATIENT_CLINIC_OR_DEPARTMENT_OTHER): Payer: Managed Care, Other (non HMO)

## 2013-11-28 ENCOUNTER — Ambulatory Visit (HOSPITAL_BASED_OUTPATIENT_CLINIC_OR_DEPARTMENT_OTHER): Payer: Managed Care, Other (non HMO)

## 2013-11-28 ENCOUNTER — Other Ambulatory Visit: Payer: Self-pay | Admitting: *Deleted

## 2013-11-28 ENCOUNTER — Encounter: Payer: Self-pay | Admitting: Nurse Practitioner

## 2013-11-28 VITALS — BP 129/73 | HR 75 | Temp 98.8°F | Resp 17

## 2013-11-28 DIAGNOSIS — D630 Anemia in neoplastic disease: Secondary | ICD-10-CM

## 2013-11-28 DIAGNOSIS — C9201 Acute myeloblastic leukemia, in remission: Secondary | ICD-10-CM

## 2013-11-28 DIAGNOSIS — C9200 Acute myeloblastic leukemia, not having achieved remission: Secondary | ICD-10-CM

## 2013-11-28 LAB — COMPREHENSIVE METABOLIC PANEL (CC13)
ALBUMIN: 3.2 g/dL — AB (ref 3.5–5.0)
ALK PHOS: 77 U/L (ref 40–150)
ALT: 15 U/L (ref 0–55)
AST: 15 U/L (ref 5–34)
Anion Gap: 9 mEq/L (ref 3–11)
BUN: 15.5 mg/dL (ref 7.0–26.0)
CALCIUM: 9.4 mg/dL (ref 8.4–10.4)
CO2: 25 mEq/L (ref 22–29)
CREATININE: 0.9 mg/dL (ref 0.7–1.3)
Chloride: 108 mEq/L (ref 98–109)
Glucose: 176 mg/dl — ABNORMAL HIGH (ref 70–140)
POTASSIUM: 4 meq/L (ref 3.5–5.1)
Sodium: 142 mEq/L (ref 136–145)
Total Bilirubin: 0.75 mg/dL (ref 0.20–1.20)
Total Protein: 6.7 g/dL (ref 6.4–8.3)

## 2013-11-28 LAB — CBC WITH DIFFERENTIAL/PLATELET
BASO%: 0 % (ref 0.0–2.0)
Basophils Absolute: 0 10*3/uL (ref 0.0–0.1)
EOS%: 0 % (ref 0.0–7.0)
Eosinophils Absolute: 0 10*3/uL (ref 0.0–0.5)
HCT: 24.6 % — ABNORMAL LOW (ref 38.4–49.9)
HGB: 8.3 g/dL — ABNORMAL LOW (ref 13.0–17.1)
LYMPH%: 100 % — ABNORMAL HIGH (ref 14.0–49.0)
MCH: 29.3 pg (ref 27.2–33.4)
MCHC: 33.7 g/dL (ref 32.0–36.0)
MCV: 86.9 fL (ref 79.3–98.0)
MONO#: 0 10*3/uL — ABNORMAL LOW (ref 0.1–0.9)
MONO%: 0 % (ref 0.0–14.0)
NEUT#: 0 10*3/uL — CL (ref 1.5–6.5)
NEUT%: 0 % — AB (ref 39.0–75.0)
Platelets: 8 10*3/uL — CL (ref 140–400)
RBC: 2.83 10*6/uL — ABNORMAL LOW (ref 4.20–5.82)
RDW: 14.8 % — AB (ref 11.0–14.6)
WBC: 0.3 10*3/uL — AB (ref 4.0–10.3)
lymph#: 0.3 10*3/uL — ABNORMAL LOW (ref 0.9–3.3)
nRBC: 0 % (ref 0–0)

## 2013-11-28 LAB — MAGNESIUM (CC13): Magnesium: 2.1 mg/dl (ref 1.5–2.5)

## 2013-11-28 LAB — HOLD TUBE, BLOOD BANK

## 2013-11-28 MED ORDER — SODIUM CHLORIDE 0.9 % IV SOLN
250.0000 mL | Freq: Once | INTRAVENOUS | Status: AC
  Administered 2013-11-28: 250 mL via INTRAVENOUS

## 2013-11-28 MED ORDER — SODIUM CHLORIDE 0.9 % IJ SOLN
3.0000 mL | INTRAMUSCULAR | Status: AC | PRN
  Administered 2013-11-28: 10 mL
  Filled 2013-11-28: qty 10

## 2013-11-28 MED ORDER — HEPARIN SOD (PORK) LOCK FLUSH 100 UNIT/ML IV SOLN
250.0000 [IU] | INTRAVENOUS | Status: DC | PRN
  Filled 2013-11-28: qty 5

## 2013-11-28 MED ORDER — HEPARIN SOD (PORK) LOCK FLUSH 100 UNIT/ML IV SOLN
500.0000 [IU] | Freq: Every day | INTRAVENOUS | Status: AC | PRN
  Administered 2013-11-28: 500 [IU]
  Filled 2013-11-28: qty 5

## 2013-11-28 NOTE — Patient Instructions (Signed)
Platelet Transfusion Information °This is information about transfusions of platelets. Platelets are tiny cells made by the bone marrow and found in the blood. When a blood vessel is damaged, platelets rush to the damaged area to help form a clot. This begins the healing process. When platelets get very low, your blood may have trouble clotting. This may be from: °· Illness. °· Blood disorder. °· Chemotherapy to treat cancer. °Often, lower platelet counts do not cause problems.  °Platelets usually last for 7 to 10 days. If they are not used in an injury, they are broken down by the liver or spleen. °Symptoms of low platelet count include: °· Nosebleeds. °· Bleeding gums. °· Heavy periods. °· Bruising and tiny blood spots in the skin. °¨ Pinpoint spots of bleeding (petechiae). °¨ Larger bruises (purpura). °· Bleeding can be more serious if it happens in the brain or bowel. °Platelet transfusions are often used to keep the platelet count at an acceptable level. Serious bleeding due to low platelets is uncommon. °RISKS AND COMPLICATIONS °Severe side effects from platelet transfusions are uncommon. Minor reactions may include: °· Itching. °· Rashes. °· High temperature and shivering. °Medications are available to stop transfusion reactions. Let your health care provider know if you develop any of the above problems.  °If you are having platelet transfusions frequently, they may get less effective. This is called becoming refractory to platelets. It is uncommon. This can happen from non-immune causes and immune causes. Non-immune causes include: °· High temperatures. °· Some medications. °· An enlarged spleen. °Immune causes happen when your body discovers the platelets are not your own and begins making antibodies against them. The antibodies kill the platelets quickly. Even with platelet transfusions, you may still notice problems with bleeding or bruising. Let your health care providers know about this. Other things  can be done to help if this happens.  °BEFORE THE PROCEDURE  °· Your health care provider will check your platelet count regularly. °· If the platelet count is too low, it may be necessary to have a platelet transfusion. °· This is more important before certain procedures with a risk of bleeding, such as a spinal tap. °· Platelet transfusion reduces the risk of bleeding during or after the procedure. °· Except in emergencies, giving a transfusion requires a written consent. °Before blood is taken from a donor, a complete history is taken to make sure the person has no history of previous diseases, nor engages in risky social behavior. Examples of this are intravenous drug use or sexual activity with multiple partners. This could lead to infected blood or blood products being used. This history is taken in spite of the extensive testing to make sure the blood is safe. All blood products transfused are tested to make sure it is a match for the person getting the blood. It is also checked for infections. Blood is the safest it has ever been. The risk of getting an infection is very low. °PROCEDURE °· The platelets are stored in small plastic bags that are kept at a low temperature. °· Each bag is called a unit and sometimes two units are given. They are given through an intravenous line by drip infusion over about one-half hour. °· Usually blood is collected from multiple people to get enough to transfuse. °· Sometimes, the platelets are collected from a single person. This is done using a special machine that separates the platelets from the blood. The machine is called an apheresis machine. Platelets collected in this   way are called apheresed platelets. Apheresed platelets reduce the risk of becoming sensitive to the platelets. This lowers the chances of having a transfusion reaction. °· As it only takes a short time to give the platelets, this treatment can be given in an outpatient department. Platelets can also be  given before or after other treatments. °SEEK IMMEDIATE MEDICAL CARE IF: °You have any of the following symptoms over the next 12 hours or several days: °· Shaking chills. °· Fever with a temperature greater than 102°F (38.9°C) develops. °· Back pain or muscle pain. °· People around you feel you are not acting correctly, or you are confused. °· Blood in the urine or bowel movements, or bleeding from any place in your body. °· Shortness of breath, or difficulty breathing. °· Dizziness. °· Fainting. °· You break out in a rash or develop hives. °· Decrease in the amount of urine you are putting out, or the urine turns a dark color or changes to pink, red, or brown. °· A severe headache or stiff neck. °· Bruising more easily. °Document Released: 11/27/2006 Document Revised: 06/16/2013 Document Reviewed: 11/27/2006 °ExitCare® Patient Information ©2015 [employer]. This information is not intended to replace advice given to you by your health care provider. Make sure you discuss any questions you have with your health care provider. ° °

## 2013-11-28 NOTE — Progress Notes (Signed)
CBC results faxed to Dr. Nadara Mustard at [facility] 11/28/13 @1018 

## 2013-11-29 LAB — PREPARE PLATELET PHERESIS: UNIT DIVISION: 0

## 2013-12-01 ENCOUNTER — Encounter: Payer: Self-pay | Admitting: *Deleted

## 2013-12-01 ENCOUNTER — Other Ambulatory Visit (HOSPITAL_BASED_OUTPATIENT_CLINIC_OR_DEPARTMENT_OTHER): Payer: Managed Care, Other (non HMO)

## 2013-12-01 DIAGNOSIS — C9201 Acute myeloblastic leukemia, in remission: Secondary | ICD-10-CM

## 2013-12-01 LAB — CBC WITH DIFFERENTIAL/PLATELET
BASO%: 0 % (ref 0.0–2.0)
Basophils Absolute: 0 10*3/uL (ref 0.0–0.1)
EOS ABS: 0 10*3/uL (ref 0.0–0.5)
EOS%: 0 % (ref 0.0–7.0)
HEMATOCRIT: 23.8 % — AB (ref 38.4–49.9)
HGB: 8.2 g/dL — ABNORMAL LOW (ref 13.0–17.1)
LYMPH#: 0.3 10*3/uL — AB (ref 0.9–3.3)
LYMPH%: 60.9 % — AB (ref 14.0–49.0)
MCH: 29.7 pg (ref 27.2–33.4)
MCHC: 34.5 g/dL (ref 32.0–36.0)
MCV: 86.2 fL (ref 79.3–98.0)
MONO#: 0 10*3/uL — AB (ref 0.1–0.9)
MONO%: 4.3 % (ref 0.0–14.0)
NEUT%: 34.8 % — AB (ref 39.0–75.0)
NEUTROS ABS: 0.2 10*3/uL — AB (ref 1.5–6.5)
NRBC: 0 % (ref 0–0)
PLATELETS: 15 10*3/uL — AB (ref 140–400)
RBC: 2.76 10*6/uL — ABNORMAL LOW (ref 4.20–5.82)
RDW: 14.7 % — ABNORMAL HIGH (ref 11.0–14.6)
WBC: 0.5 10*3/uL — AB (ref 4.0–10.3)

## 2013-12-01 LAB — COMPREHENSIVE METABOLIC PANEL (CC13)
ALT: 17 U/L (ref 0–55)
ANION GAP: 8 meq/L (ref 3–11)
AST: 15 U/L (ref 5–34)
Albumin: 3.3 g/dL — ABNORMAL LOW (ref 3.5–5.0)
Alkaline Phosphatase: 79 U/L (ref 40–150)
BUN: 14.6 mg/dL (ref 7.0–26.0)
CALCIUM: 9.7 mg/dL (ref 8.4–10.4)
CO2: 25 meq/L (ref 22–29)
CREATININE: 0.9 mg/dL (ref 0.7–1.3)
Chloride: 107 mEq/L (ref 98–109)
Glucose: 184 mg/dl — ABNORMAL HIGH (ref 70–140)
POTASSIUM: 4.1 meq/L (ref 3.5–5.1)
Sodium: 140 mEq/L (ref 136–145)
Total Bilirubin: 0.49 mg/dL (ref 0.20–1.20)
Total Protein: 7 g/dL (ref 6.4–8.3)

## 2013-12-01 LAB — HOLD TUBE, BLOOD BANK

## 2013-12-01 LAB — MAGNESIUM (CC13): Magnesium: 2.1 mg/dl (ref 1.5–2.5)

## 2013-12-01 NOTE — Progress Notes (Signed)
Faxed labs to [facility] at fax [phone].

## 2013-12-03 ENCOUNTER — Other Ambulatory Visit: Payer: Managed Care, Other (non HMO)

## 2013-12-04 ENCOUNTER — Telehealth: Payer: Self-pay | Admitting: Hematology and Oncology

## 2013-12-04 ENCOUNTER — Encounter: Payer: Self-pay | Admitting: *Deleted

## 2013-12-04 ENCOUNTER — Other Ambulatory Visit: Payer: Self-pay | Admitting: *Deleted

## 2013-12-05 ENCOUNTER — Other Ambulatory Visit: Payer: Managed Care, Other (non HMO)

## 2013-12-08 ENCOUNTER — Other Ambulatory Visit (HOSPITAL_BASED_OUTPATIENT_CLINIC_OR_DEPARTMENT_OTHER): Payer: Managed Care, Other (non HMO)

## 2013-12-08 ENCOUNTER — Encounter: Payer: Self-pay | Admitting: *Deleted

## 2013-12-08 DIAGNOSIS — C9201 Acute myeloblastic leukemia, in remission: Secondary | ICD-10-CM

## 2013-12-08 LAB — COMPREHENSIVE METABOLIC PANEL (CC13)
ALT: 17 U/L (ref 0–55)
AST: 19 U/L (ref 5–34)
Albumin: 3.3 g/dL — ABNORMAL LOW (ref 3.5–5.0)
Alkaline Phosphatase: 71 U/L (ref 40–150)
Anion Gap: 9 mEq/L (ref 3–11)
BILIRUBIN TOTAL: 0.41 mg/dL (ref 0.20–1.20)
BUN: 14.9 mg/dL (ref 7.0–26.0)
CO2: 23 meq/L (ref 22–29)
CREATININE: 0.8 mg/dL (ref 0.7–1.3)
Calcium: 9.3 mg/dL (ref 8.4–10.4)
Chloride: 107 mEq/L (ref 98–109)
Glucose: 192 mg/dl — ABNORMAL HIGH (ref 70–140)
Potassium: 4 mEq/L (ref 3.5–5.1)
Sodium: 140 mEq/L (ref 136–145)
Total Protein: 6.7 g/dL (ref 6.4–8.3)

## 2013-12-08 LAB — CBC WITH DIFFERENTIAL/PLATELET
BASO%: 0 % (ref 0.0–2.0)
BASOS ABS: 0 10*3/uL (ref 0.0–0.1)
EOS ABS: 0 10*3/uL (ref 0.0–0.5)
EOS%: 0 % (ref 0.0–7.0)
HCT: 26.2 % — ABNORMAL LOW (ref 38.4–49.9)
HEMOGLOBIN: 9 g/dL — AB (ref 13.0–17.1)
LYMPH%: 31.6 % (ref 14.0–49.0)
MCH: 29.1 pg (ref 27.2–33.4)
MCHC: 34.4 g/dL (ref 32.0–36.0)
MCV: 84.8 fL (ref 79.3–98.0)
MONO#: 0.3 10*3/uL (ref 0.1–0.9)
MONO%: 15.5 % — AB (ref 0.0–14.0)
NEUT%: 52.9 % (ref 39.0–75.0)
NEUTROS ABS: 1 10*3/uL — AB (ref 1.5–6.5)
Platelets: 21 10*3/uL — ABNORMAL LOW (ref 140–400)
RBC: 3.09 10*6/uL — ABNORMAL LOW (ref 4.20–5.82)
RDW: 14.2 % (ref 11.0–14.6)
WBC: 1.9 10*3/uL — ABNORMAL LOW (ref 4.0–10.3)
lymph#: 0.6 10*3/uL — ABNORMAL LOW (ref 0.9–3.3)
nRBC: 2 % — ABNORMAL HIGH (ref 0–0)

## 2013-12-08 LAB — HOLD TUBE, BLOOD BANK

## 2013-12-08 LAB — MAGNESIUM (CC13): MAGNESIUM: 2 mg/dL (ref 1.5–2.5)

## 2013-12-08 NOTE — Progress Notes (Signed)
Faxed lab results from today to Dr. Nadara Mustard at [facility] fax [phone].

## 2013-12-11 ENCOUNTER — Other Ambulatory Visit (HOSPITAL_BASED_OUTPATIENT_CLINIC_OR_DEPARTMENT_OTHER): Payer: Managed Care, Other (non HMO)

## 2013-12-11 ENCOUNTER — Other Ambulatory Visit: Payer: Self-pay | Admitting: Hematology and Oncology

## 2013-12-11 ENCOUNTER — Telehealth: Payer: Self-pay | Admitting: *Deleted

## 2013-12-11 DIAGNOSIS — C9201 Acute myeloblastic leukemia, in remission: Secondary | ICD-10-CM

## 2013-12-11 LAB — CBC WITH DIFFERENTIAL/PLATELET
BASO%: 0 % (ref 0.0–2.0)
Basophils Absolute: 0 10*3/uL (ref 0.0–0.1)
EOS ABS: 0 10*3/uL (ref 0.0–0.5)
EOS%: 0 % (ref 0.0–7.0)
HCT: 27 % — ABNORMAL LOW (ref 38.4–49.9)
HGB: 9.4 g/dL — ABNORMAL LOW (ref 13.0–17.1)
LYMPH%: 29.1 % (ref 14.0–49.0)
MCH: 29.9 pg (ref 27.2–33.4)
MCHC: 34.8 g/dL (ref 32.0–36.0)
MCV: 86 fL (ref 79.3–98.0)
MONO#: 0.5 10*3/uL (ref 0.1–0.9)
MONO%: 20.2 % — AB (ref 0.0–14.0)
NEUT%: 50.7 % (ref 39.0–75.0)
NEUTROS ABS: 1.1 10*3/uL — AB (ref 1.5–6.5)
Platelets: 38 10*3/uL — ABNORMAL LOW (ref 140–400)
RBC: 3.14 10*6/uL — AB (ref 4.20–5.82)
RDW: 14 % (ref 11.0–14.6)
WBC: 2.2 10*3/uL — ABNORMAL LOW (ref 4.0–10.3)
lymph#: 0.7 10*3/uL — ABNORMAL LOW (ref 0.9–3.3)

## 2013-12-11 LAB — HOLD TUBE, BLOOD BANK

## 2013-12-11 NOTE — Telephone Encounter (Signed)
Faxed labs to [facility] at fax [phone].

## 2013-12-11 NOTE — Telephone Encounter (Signed)
Informed of lab results.  Pt was given copy by lab and he verbalized understanding. He will return on Monday for lab as scheduled.  He is then scheduled to return to [facility] next Thursday for more chemotherapy.

## 2013-12-15 ENCOUNTER — Telehealth: Payer: Self-pay | Admitting: *Deleted

## 2013-12-15 ENCOUNTER — Other Ambulatory Visit (HOSPITAL_BASED_OUTPATIENT_CLINIC_OR_DEPARTMENT_OTHER): Payer: Managed Care, Other (non HMO)

## 2013-12-15 DIAGNOSIS — C9201 Acute myeloblastic leukemia, in remission: Secondary | ICD-10-CM

## 2013-12-15 LAB — COMPREHENSIVE METABOLIC PANEL (CC13)
ALBUMIN: 3.3 g/dL — AB (ref 3.5–5.0)
ALK PHOS: 70 U/L (ref 40–150)
ALT: 19 U/L (ref 0–55)
AST: 17 U/L (ref 5–34)
Anion Gap: 10 mEq/L (ref 3–11)
BUN: 12.3 mg/dL (ref 7.0–26.0)
CALCIUM: 9.2 mg/dL (ref 8.4–10.4)
CHLORIDE: 107 meq/L (ref 98–109)
CO2: 23 mEq/L (ref 22–29)
Creatinine: 0.9 mg/dL (ref 0.7–1.3)
Glucose: 226 mg/dl — ABNORMAL HIGH (ref 70–140)
POTASSIUM: 4.1 meq/L (ref 3.5–5.1)
SODIUM: 140 meq/L (ref 136–145)
TOTAL PROTEIN: 6.8 g/dL (ref 6.4–8.3)
Total Bilirubin: 0.45 mg/dL (ref 0.20–1.20)

## 2013-12-15 LAB — CBC WITH DIFFERENTIAL/PLATELET
BASO%: 0.4 % (ref 0.0–2.0)
Basophils Absolute: 0 10*3/uL (ref 0.0–0.1)
EOS%: 0 % (ref 0.0–7.0)
Eosinophils Absolute: 0 10*3/uL (ref 0.0–0.5)
HEMATOCRIT: 26 % — AB (ref 38.4–49.9)
HGB: 8.9 g/dL — ABNORMAL LOW (ref 13.0–17.1)
LYMPH#: 0.5 10*3/uL — AB (ref 0.9–3.3)
LYMPH%: 17.7 % (ref 14.0–49.0)
MCH: 29.9 pg (ref 27.2–33.4)
MCHC: 34.2 g/dL (ref 32.0–36.0)
MCV: 87.2 fL (ref 79.3–98.0)
MONO#: 0.5 10*3/uL (ref 0.1–0.9)
MONO%: 18.4 % — ABNORMAL HIGH (ref 0.0–14.0)
NEUT#: 1.8 10*3/uL (ref 1.5–6.5)
NEUT%: 63.5 % (ref 39.0–75.0)
NRBC: 6 % — AB (ref 0–0)
Platelets: 77 10*3/uL — ABNORMAL LOW (ref 140–400)
RBC: 2.98 10*6/uL — ABNORMAL LOW (ref 4.20–5.82)
RDW: 14.1 % (ref 11.0–14.6)
WBC: 2.8 10*3/uL — AB (ref 4.0–10.3)

## 2013-12-15 NOTE — Telephone Encounter (Signed)
-----   Message from Heath Lark, MD sent at 12/15/2013  8:42 AM EST ----- Regarding: he can go   ----- Message -----    From: [facility]    Sent: 12/15/2013   8:39 AM      To: Heath Lark, MD

## 2013-12-15 NOTE — Telephone Encounter (Signed)
Informed pt of lab results are good and will fax to [facility].  He verbalized understanding. He is scheduled to go back for chemo this Thursday.  Informed pt we will wait for new orders when he is d/c'd from [facility] again.  He verbalized understanding.

## 2013-12-15 NOTE — Telephone Encounter (Signed)
Copy of labs done today (cbc,diff & C-met) faxed to Dr [facility] @ [phone].

## 2013-12-18 ENCOUNTER — Other Ambulatory Visit: Payer: Self-pay | Admitting: *Deleted

## 2013-12-18 ENCOUNTER — Telehealth: Payer: Self-pay | Admitting: Hematology and Oncology

## 2013-12-18 DIAGNOSIS — D469 Myelodysplastic syndrome, unspecified: Secondary | ICD-10-CM

## 2013-12-18 NOTE — Telephone Encounter (Signed)
Per 11/05 POF labs/inj added called Dr. Alvy Bimler desk nurse to call nurse at [facility] to confirm schedule..... Will mail sch t pt's home.... KJ

## 2013-12-19 ENCOUNTER — Telehealth: Payer: Self-pay | Admitting: *Deleted

## 2013-12-19 ENCOUNTER — Other Ambulatory Visit: Payer: Self-pay | Admitting: Hematology and Oncology

## 2013-12-19 ENCOUNTER — Telehealth: Payer: Self-pay | Admitting: Hematology and Oncology

## 2013-12-19 NOTE — Telephone Encounter (Signed)
Orders received from [facility] for labs Monday, Wed and Friday starting on Wed 11/11.  Neulasta on 11/11.  Orders also read to please transfuse w/ 2 units PRBCs for Hgb less than 9.0 and 1 unit of Platelets for Platelet count less than 20.0.   All blood products irradiated.  Copy of orders given to Dr. Alvy Bimler and one copy kept at nurse desk. Schedule called to Dorise Hiss, RN ph [phone] at [facility].

## 2013-12-19 NOTE — Telephone Encounter (Signed)
s.w. pt and advised on NOV appt....pt ok and aware °

## 2013-12-22 ENCOUNTER — Other Ambulatory Visit: Payer: Self-pay | Admitting: Hematology and Oncology

## 2013-12-24 ENCOUNTER — Ambulatory Visit (HOSPITAL_BASED_OUTPATIENT_CLINIC_OR_DEPARTMENT_OTHER): Payer: Managed Care, Other (non HMO) | Admitting: Hematology and Oncology

## 2013-12-24 ENCOUNTER — Other Ambulatory Visit (HOSPITAL_BASED_OUTPATIENT_CLINIC_OR_DEPARTMENT_OTHER): Payer: Managed Care, Other (non HMO)

## 2013-12-24 ENCOUNTER — Encounter: Payer: Self-pay | Admitting: Hematology and Oncology

## 2013-12-24 ENCOUNTER — Other Ambulatory Visit: Payer: Managed Care, Other (non HMO)

## 2013-12-24 ENCOUNTER — Ambulatory Visit: Payer: Managed Care, Other (non HMO)

## 2013-12-24 ENCOUNTER — Ambulatory Visit (HOSPITAL_BASED_OUTPATIENT_CLINIC_OR_DEPARTMENT_OTHER): Payer: Managed Care, Other (non HMO)

## 2013-12-24 VITALS — BP 114/67 | HR 76 | Temp 98.6°F | Resp 18 | Ht 68.75 in | Wt 188.2 lb

## 2013-12-24 DIAGNOSIS — C9201 Acute myeloblastic leukemia, in remission: Secondary | ICD-10-CM

## 2013-12-24 DIAGNOSIS — T50905A Adverse effect of unspecified drugs, medicaments and biological substances, initial encounter: Secondary | ICD-10-CM

## 2013-12-24 DIAGNOSIS — D6959 Other secondary thrombocytopenia: Secondary | ICD-10-CM

## 2013-12-24 DIAGNOSIS — D469 Myelodysplastic syndrome, unspecified: Secondary | ICD-10-CM

## 2013-12-24 DIAGNOSIS — D630 Anemia in neoplastic disease: Secondary | ICD-10-CM

## 2013-12-24 LAB — CBC WITH DIFFERENTIAL/PLATELET
BASO%: 0.4 % (ref 0.0–2.0)
BASOS ABS: 0 10*3/uL (ref 0.0–0.1)
EOS ABS: 0 10*3/uL (ref 0.0–0.5)
EOS%: 0.3 % (ref 0.0–7.0)
HEMATOCRIT: 31.4 % — AB (ref 38.4–49.9)
HGB: 10.5 g/dL — ABNORMAL LOW (ref 13.0–17.1)
LYMPH#: 0.1 10*3/uL — AB (ref 0.9–3.3)
LYMPH%: 4.9 % — ABNORMAL LOW (ref 14.0–49.0)
MCH: 29.8 pg (ref 27.2–33.4)
MCHC: 33.3 g/dL (ref 32.0–36.0)
MCV: 89.6 fL (ref 79.3–98.0)
MONO#: 0 10*3/uL — ABNORMAL LOW (ref 0.1–0.9)
MONO%: 0.6 % (ref 0.0–14.0)
NEUT%: 93.8 % — AB (ref 39.0–75.0)
NEUTROS ABS: 1.8 10*3/uL (ref 1.5–6.5)
Platelets: 78 10*3/uL — ABNORMAL LOW (ref 140–400)
RBC: 3.5 10*6/uL — ABNORMAL LOW (ref 4.20–5.82)
RDW: 15.5 % — ABNORMAL HIGH (ref 11.0–14.6)
WBC: 2 10*3/uL — ABNORMAL LOW (ref 4.0–10.3)

## 2013-12-24 LAB — COMPREHENSIVE METABOLIC PANEL (CC13)
ALT: 17 U/L (ref 0–55)
AST: 20 U/L (ref 5–34)
Albumin: 3.6 g/dL (ref 3.5–5.0)
Alkaline Phosphatase: 68 U/L (ref 40–150)
Anion Gap: 7 mEq/L (ref 3–11)
BILIRUBIN TOTAL: 0.73 mg/dL (ref 0.20–1.20)
BUN: 20.7 mg/dL (ref 7.0–26.0)
CO2: 28 mEq/L (ref 22–29)
Calcium: 9.3 mg/dL (ref 8.4–10.4)
Chloride: 104 mEq/L (ref 98–109)
Creatinine: 0.9 mg/dL (ref 0.7–1.3)
Glucose: 171 mg/dl — ABNORMAL HIGH (ref 70–140)
Potassium: 4 mEq/L (ref 3.5–5.1)
Sodium: 138 mEq/L (ref 136–145)
Total Protein: 7.1 g/dL (ref 6.4–8.3)

## 2013-12-24 LAB — HOLD TUBE, BLOOD BANK

## 2013-12-24 LAB — MAGNESIUM (CC13): Magnesium: 2.1 mg/dl (ref 1.5–2.5)

## 2013-12-24 MED ORDER — PEGFILGRASTIM INJECTION 6 MG/0.6ML
6.0000 mg | Freq: Once | SUBCUTANEOUS | Status: AC
  Administered 2013-12-24: 6 mg via SUBCUTANEOUS
  Filled 2013-12-24: qty 0.6

## 2013-12-24 NOTE — Assessment & Plan Note (Signed)
This is likely due to recent treatment. The patient denies recent history of bleeding such as epistaxis, hematuria or hematochezia. He is asymptomatic from the anemia. I will observe for now.   Per protocol/instrucion from [facility] will transfuse 2 units of irradiated blood if hemoglobin is less than 8 g.  

## 2013-12-24 NOTE — Progress Notes (Signed)
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 The patient is seen after cycle 2 of consultation treatment. He tolerated recent treatment well without any side effects.  REVIEW OF SYSTEMS:   Constitutional: Denies fevers, chills or abnormal weight loss Eyes: Denies blurriness of vision Ears, nose, mouth, throat, and face: Denies mucositis or sore throat Respiratory: Denies cough, dyspnea or wheezes Cardiovascular: Denies palpitation, chest discomfort or lower extremity swelling Gastrointestinal:  Denies nausea, heartburn or change in bowel habits Skin: Denies abnormal skin rashes Lymphatics: Denies new lymphadenopathy or easy bruising Neurological:Denies numbness, tingling or new weaknesses Behavioral/Psych: Mood is stable, no new changes  All other systems were reviewed with the patient and are negative.  I have reviewed the past medical history, past surgical history, social history and family history with the patient and they are unchanged from previous note.  ALLERGIES:  is allergic to morphine and related.  MEDICATIONS:  Current Outpatient Prescriptions  Medication Sig Dispense Refill  . atenolol (TENORMIN) 25 MG tablet Take 12.5 mg by mouth every morning.    . fluconazole (DIFLUCAN) 200 MG tablet Take 200 mg by mouth daily.    Marland Kitchen glucose  blood (FREESTYLE LITE) test strip Test once daily dx 250.00 100 each 3  . insulin lispro protamine-lispro (HUMALOG 75/25 MIX) (75-25) 100 UNIT/ML SUSP injection Inject 15 Units into the skin at bedtime. 3 vial 11  . INSULIN SYRINGE .5CC/29G (B-D INSULIN SYRINGE) 29G X 1/2" 0.5 ML MISC Use once daily, dx 250.00 100 each 3  . levofloxacin (LEVAQUIN) 500 MG tablet Take 500 mg by mouth daily.    . ondansetron (ZOFRAN) 8 MG tablet Take 8 mg by mouth as needed.    . simvastatin (ZOCOR) 20 MG tablet Take 1 tablet (20 mg total) by mouth at bedtime. 100 tablet 3   No current facility-administered medications for this visit.    PHYSICAL EXAMINATION: ECOG PERFORMANCE STATUS: 0 - Asymptomatic  Filed Vitals:   12/24/13 1114  BP: 114/67  Pulse: 76  Temp: 98.6 F (37 C)  Resp: 18   Filed Weights   12/24/13 1114  Weight: 188 lb 3.2 oz (85.367 kg)    GENERAL:alert, no distress and comfortable SKIN: skin color, texture, turgor are normal, no rashes or significant lesions EYES: normal, Conjunctiva are pink and non-injected, sclera clear OROPHARYNX:no exudate, no erythema and lips, buccal mucosa, and tongue normal  NECK: supple, thyroid normal size, non-tender, without nodularity LYMPH:  no palpable lymphadenopathy in the cervical, axillary or inguinal LUNGS: clear to auscultation and percussion with normal breathing effort HEART: regular rate & rhythm and no murmurs and no lower extremity edema ABDOMEN:abdomen soft, non-tender and normal bowel sounds Musculoskeletal:no cyanosis of digits and no clubbing  NEURO: alert & oriented x 3 with fluent speech, no focal motor/sensory deficits  LABORATORY DATA:  I have reviewed the data as listed  Component Value Date/Time   NA 138 12/24/2013 1045   NA 139 08/22/2013 0758   K 4.0 12/24/2013 1045   K 3.9 08/22/2013 0758   CL 104 08/22/2013 0758   CO2 28 12/24/2013 1045   CO2 22 08/22/2013 0758   GLUCOSE 171* 12/24/2013 1045   GLUCOSE 120*  08/22/2013 0758   BUN 20.7 12/24/2013 1045   BUN 19 08/22/2013 0758   CREATININE 0.9 12/24/2013 1045   CREATININE 0.9 08/22/2013 0758   CALCIUM 9.3 12/24/2013 1045   CALCIUM 8.8 08/22/2013 0758   PROT 7.1 12/24/2013 1045   PROT 6.6 08/22/2013 0758   ALBUMIN 3.6 12/24/2013 1045   ALBUMIN 3.8 08/22/2013 0758   AST 20 12/24/2013 1045   AST 23 08/22/2013 0758   ALT 17 12/24/2013 1045   ALT 17 08/22/2013 0758   ALKPHOS 68 12/24/2013 1045   ALKPHOS 41 08/22/2013 0758   BILITOT 0.73 12/24/2013 1045   BILITOT 0.8 08/22/2013 0758   GFRNONAA 71.09 01/24/2010 0753   GFRAA 78 01/20/2008 0755    No results found for: SPEP, UPEP  Lab Results  Component Value Date   WBC 2.0* 12/24/2013   NEUTROABS 1.8 12/24/2013   HGB 10.5* 12/24/2013   HCT 31.4* 12/24/2013   MCV 89.6 12/24/2013   PLT 78* 12/24/2013      Chemistry      Component Value Date/Time   NA 138 12/24/2013 1045   NA 139 08/22/2013 0758   K 4.0 12/24/2013 1045   K 3.9 08/22/2013 0758   CL 104 08/22/2013 0758   CO2 28 12/24/2013 1045   CO2 22 08/22/2013 0758   BUN 20.7 12/24/2013 1045   BUN 19 08/22/2013 0758   CREATININE 0.9 12/24/2013 1045   CREATININE 0.9 08/22/2013 0758      Component Value Date/Time   CALCIUM 9.3 12/24/2013 1045   CALCIUM 8.8 08/22/2013 0758   ALKPHOS 68 12/24/2013 1045   ALKPHOS 41 08/22/2013 0758   AST 20 12/24/2013 1045   AST 23 08/22/2013 0758   ALT 17 12/24/2013 1045   ALT 17 08/22/2013 0758   BILITOT 0.73 12/24/2013 1045   BILITOT 0.8 08/22/2013 0758      ASSESSMENT & PLAN:  AML (acute myeloid leukemia) in remission He is in remission. Continue aggressive supportive care with Neulasta injection today to reduce risk of neutropenic fever. He is on anti-microbial prophylaxis per protocol. I will bring him in 3 times a week for blood work and transfusion support    Anemia in neoplastic disease This is likely due to recent treatment. The patient denies recent history of bleeding  such as epistaxis, hematuria or hematochezia. He is asymptomatic from the anemia. I will observe for now.   Per protocol/instrucion from [facility] will transfuse 2 units of irradiated blood if hemoglobin is less than 8 g.    Thrombocytopenia due to drugs This is likely due to recent treatment. The patient denies recent history of bleeding such as epistaxis, hematuria or hematochezia. He is asymptomatic from the low platelet count. I will observe for now.   Per protocol/instrucion from [facility] will transfuse 1 unit of irradiated platelets if platelet count is less than 20,000 or bleeding.     No orders of the defined types were placed in this encounter.   All questions were answered. The patient knows to call the clinic with any problems, questions or concerns. No barriers to learning was detected. I spent 25 minutes counseling the patient face to face.  The total time spent in the appointment was 30 minutes and more than 50% was on counseling and review of test results     [facility], [place], MD 12/24/2013 11:46 AM

## 2013-12-24 NOTE — Assessment & Plan Note (Signed)
He is in remission. Continue aggressive supportive care with Neulasta injection today to reduce risk of neutropenic fever. He is on anti-microbial prophylaxis per protocol. I will bring him in 3 times a week for blood work and transfusion support

## 2013-12-24 NOTE — Assessment & Plan Note (Signed)
This is likely due to recent treatment. The patient denies recent history of bleeding such as epistaxis, hematuria or hematochezia. He is asymptomatic from the low platelet count. I will observe for now.   Per protocol/instrucion from [facility] will transfuse 1 unit of irradiated platelets if platelet count is less than 20,000 or bleeding. 

## 2013-12-26 ENCOUNTER — Encounter: Payer: Self-pay | Admitting: *Deleted

## 2013-12-26 ENCOUNTER — Other Ambulatory Visit (HOSPITAL_BASED_OUTPATIENT_CLINIC_OR_DEPARTMENT_OTHER): Payer: Managed Care, Other (non HMO)

## 2013-12-26 ENCOUNTER — Telehealth: Payer: Self-pay | Admitting: Hematology and Oncology

## 2013-12-26 ENCOUNTER — Other Ambulatory Visit: Payer: Self-pay | Admitting: *Deleted

## 2013-12-26 DIAGNOSIS — D469 Myelodysplastic syndrome, unspecified: Secondary | ICD-10-CM

## 2013-12-26 DIAGNOSIS — C9201 Acute myeloblastic leukemia, in remission: Secondary | ICD-10-CM

## 2013-12-26 LAB — COMPREHENSIVE METABOLIC PANEL (CC13)
ALT: 14 U/L (ref 0–55)
ANION GAP: 7 meq/L (ref 3–11)
AST: 16 U/L (ref 5–34)
Albumin: 3.4 g/dL — ABNORMAL LOW (ref 3.5–5.0)
Alkaline Phosphatase: 73 U/L (ref 40–150)
BUN: 16.2 mg/dL (ref 7.0–26.0)
CO2: 26 meq/L (ref 22–29)
Calcium: 9.2 mg/dL (ref 8.4–10.4)
Chloride: 107 mEq/L (ref 98–109)
Creatinine: 0.9 mg/dL (ref 0.7–1.3)
Glucose: 193 mg/dl — ABNORMAL HIGH (ref 70–140)
Potassium: 4 mEq/L (ref 3.5–5.1)
SODIUM: 140 meq/L (ref 136–145)
TOTAL PROTEIN: 6.7 g/dL (ref 6.4–8.3)
Total Bilirubin: 0.91 mg/dL (ref 0.20–1.20)

## 2013-12-26 LAB — CBC WITH DIFFERENTIAL/PLATELET
BASO%: 0.4 % (ref 0.0–2.0)
Basophils Absolute: 0 10*3/uL (ref 0.0–0.1)
EOS%: 0 % (ref 0.0–7.0)
Eosinophils Absolute: 0 10*3/uL (ref 0.0–0.5)
HCT: 27.8 % — ABNORMAL LOW (ref 38.4–49.9)
HEMOGLOBIN: 9.4 g/dL — AB (ref 13.0–17.1)
LYMPH%: 6.3 % — AB (ref 14.0–49.0)
MCH: 29.8 pg (ref 27.2–33.4)
MCHC: 33.8 g/dL (ref 32.0–36.0)
MCV: 88.3 fL (ref 79.3–98.0)
MONO#: 0 10*3/uL — ABNORMAL LOW (ref 0.1–0.9)
MONO%: 0.4 % (ref 0.0–14.0)
NEUT#: 2.4 10*3/uL (ref 1.5–6.5)
NEUT%: 92.9 % — ABNORMAL HIGH (ref 39.0–75.0)
PLATELETS: 27 10*3/uL — AB (ref 140–400)
RBC: 3.15 10*6/uL — ABNORMAL LOW (ref 4.20–5.82)
RDW: 15.8 % — ABNORMAL HIGH (ref 11.0–14.6)
WBC: 2.5 10*3/uL — ABNORMAL LOW (ref 4.0–10.3)
lymph#: 0.2 10*3/uL — ABNORMAL LOW (ref 0.9–3.3)
nRBC: 0 % (ref 0–0)

## 2013-12-26 LAB — HOLD TUBE, BLOOD BANK

## 2013-12-26 LAB — MAGNESIUM (CC13): Magnesium: 2.2 mg/dl (ref 1.5–2.5)

## 2013-12-26 NOTE — Progress Notes (Signed)
Faxed lab results to [facility] at fax [phone].   S/w pt this morning in lobby. Gave copy of CBC results and informed no need for transfusion today.  He may need transfusion on Monday.  Informed him will try to add him on to our infusion schedule but if there is no room we may have to send him to [facility] for  Blood or platelets on Monday.  He verbalized understanding. Checked w/ [employer].  She states no room for transfusion on Monday 11/16.  If pt needs transfusion will send to [facility].

## 2013-12-29 ENCOUNTER — Ambulatory Visit (HOSPITAL_BASED_OUTPATIENT_CLINIC_OR_DEPARTMENT_OTHER): Payer: Managed Care, Other (non HMO)

## 2013-12-29 ENCOUNTER — Other Ambulatory Visit (HOSPITAL_BASED_OUTPATIENT_CLINIC_OR_DEPARTMENT_OTHER): Payer: Managed Care, Other (non HMO)

## 2013-12-29 ENCOUNTER — Other Ambulatory Visit: Payer: Self-pay | Admitting: Hematology and Oncology

## 2013-12-29 VITALS — BP 124/69 | HR 80 | Temp 98.7°F | Resp 16

## 2013-12-29 DIAGNOSIS — D696 Thrombocytopenia, unspecified: Secondary | ICD-10-CM

## 2013-12-29 DIAGNOSIS — D469 Myelodysplastic syndrome, unspecified: Secondary | ICD-10-CM

## 2013-12-29 DIAGNOSIS — D630 Anemia in neoplastic disease: Secondary | ICD-10-CM

## 2013-12-29 DIAGNOSIS — C9201 Acute myeloblastic leukemia, in remission: Secondary | ICD-10-CM

## 2013-12-29 LAB — COMPREHENSIVE METABOLIC PANEL (CC13)
ALT: 14 U/L (ref 0–55)
AST: 13 U/L (ref 5–34)
Albumin: 3.5 g/dL (ref 3.5–5.0)
Alkaline Phosphatase: 75 U/L (ref 40–150)
Anion Gap: 9 mEq/L (ref 3–11)
BILIRUBIN TOTAL: 0.74 mg/dL (ref 0.20–1.20)
BUN: 16.2 mg/dL (ref 7.0–26.0)
CALCIUM: 9.5 mg/dL (ref 8.4–10.4)
CHLORIDE: 106 meq/L (ref 98–109)
CO2: 25 meq/L (ref 22–29)
Creatinine: 0.9 mg/dL (ref 0.7–1.3)
Glucose: 181 mg/dl — ABNORMAL HIGH (ref 70–140)
Potassium: 3.9 mEq/L (ref 3.5–5.1)
Sodium: 140 mEq/L (ref 136–145)
Total Protein: 6.9 g/dL (ref 6.4–8.3)

## 2013-12-29 LAB — CBC WITH DIFFERENTIAL/PLATELET
BASO%: 4.3 % — AB (ref 0.0–2.0)
BASOS ABS: 0 10*3/uL (ref 0.0–0.1)
EOS%: 0 % (ref 0.0–7.0)
Eosinophils Absolute: 0 10*3/uL (ref 0.0–0.5)
HCT: 24.8 % — ABNORMAL LOW (ref 38.4–49.9)
HGB: 8.5 g/dL — ABNORMAL LOW (ref 13.0–17.1)
LYMPH%: 82.6 % — AB (ref 14.0–49.0)
MCH: 29.8 pg (ref 27.2–33.4)
MCHC: 34.3 g/dL (ref 32.0–36.0)
MCV: 87 fL (ref 79.3–98.0)
MONO#: 0 10*3/uL — ABNORMAL LOW (ref 0.1–0.9)
MONO%: 0 % (ref 0.0–14.0)
NEUT#: 0 10*3/uL — CL (ref 1.5–6.5)
NEUT%: 13.1 % — AB (ref 39.0–75.0)
PLATELETS: 5 10*3/uL — AB (ref 140–400)
RBC: 2.85 10*6/uL — ABNORMAL LOW (ref 4.20–5.82)
RDW: 15.4 % — ABNORMAL HIGH (ref 11.0–14.6)
WBC: 0.2 10*3/uL — CL (ref 4.0–10.3)
lymph#: 0.2 10*3/uL — ABNORMAL LOW (ref 0.9–3.3)
nRBC: 0 % (ref 0–0)

## 2013-12-29 LAB — HOLD TUBE, BLOOD BANK

## 2013-12-29 LAB — MAGNESIUM (CC13): MAGNESIUM: 2.1 mg/dL (ref 1.5–2.5)

## 2013-12-29 MED ORDER — SODIUM CHLORIDE 0.9 % IJ SOLN
10.0000 mL | INTRAMUSCULAR | Status: AC | PRN
  Administered 2013-12-29: 10 mL
  Filled 2013-12-29: qty 10

## 2013-12-29 MED ORDER — SODIUM CHLORIDE 0.9 % IV SOLN
250.0000 mL | Freq: Once | INTRAVENOUS | Status: AC
  Administered 2013-12-29: 250 mL via INTRAVENOUS

## 2013-12-29 MED ORDER — HEPARIN SOD (PORK) LOCK FLUSH 100 UNIT/ML IV SOLN
500.0000 [IU] | Freq: Every day | INTRAVENOUS | Status: AC | PRN
  Administered 2013-12-29: 500 [IU]
  Filled 2013-12-29: qty 5

## 2013-12-29 MED ORDER — HEPARIN SOD (PORK) LOCK FLUSH 100 UNIT/ML IV SOLN
250.0000 [IU] | INTRAVENOUS | Status: DC | PRN
  Filled 2013-12-29: qty 5

## 2013-12-30 ENCOUNTER — Ambulatory Visit: Payer: Managed Care, Other (non HMO) | Admitting: Hematology and Oncology

## 2013-12-30 ENCOUNTER — Telehealth: Payer: Self-pay | Admitting: *Deleted

## 2013-12-30 LAB — PREPARE PLATELET PHERESIS: Unit division: 0

## 2013-12-30 NOTE — Telephone Encounter (Signed)
Pt called to ask if he should be scheduled for transfusion tomorrow as he will likely need blood tomorrow.  Per Scheduler there is no room for transfusion tomorrow.  Informed pt of no room to add him onto schedule tomorrow.  If he does need blood tomorrow there may be a cancellation or we will need to send him over to [facility],.  He verbalized understanding.

## 2013-12-31 ENCOUNTER — Other Ambulatory Visit (HOSPITAL_BASED_OUTPATIENT_CLINIC_OR_DEPARTMENT_OTHER): Payer: Managed Care, Other (non HMO)

## 2013-12-31 ENCOUNTER — Ambulatory Visit (HOSPITAL_COMMUNITY)
Admission: AD | Admit: 2013-12-31 | Discharge: 2013-12-31 | Disposition: A | Discharge status: Home / Self Care | Payer: Managed Care, Other (non HMO) | Source: Ambulatory Visit | Attending: Hematology and Oncology | Admitting: Hematology and Oncology

## 2013-12-31 ENCOUNTER — Other Ambulatory Visit: Payer: Self-pay | Admitting: Hematology and Oncology

## 2013-12-31 ENCOUNTER — Non-Acute Institutional Stay (HOSPITAL_COMMUNITY)
Admission: AD | Admit: 2013-12-31 | Discharge: 2013-12-31 | Disposition: A | Discharge status: Home / Self Care | Payer: Managed Care, Other (non HMO) | Source: Ambulatory Visit | Attending: Hematology and Oncology | Admitting: Hematology and Oncology

## 2013-12-31 DIAGNOSIS — D630 Anemia in neoplastic disease: Secondary | ICD-10-CM

## 2013-12-31 DIAGNOSIS — D469 Myelodysplastic syndrome, unspecified: Secondary | ICD-10-CM

## 2013-12-31 DIAGNOSIS — D696 Thrombocytopenia, unspecified: Secondary | ICD-10-CM

## 2013-12-31 DIAGNOSIS — C9200 Acute myeloblastic leukemia, not having achieved remission: Secondary | ICD-10-CM

## 2013-12-31 DIAGNOSIS — C9201 Acute myeloblastic leukemia, in remission: Secondary | ICD-10-CM

## 2013-12-31 LAB — CBC WITH DIFFERENTIAL/PLATELET
BASO%: 0 % (ref 0.0–2.0)
BASOS ABS: 0 10*3/uL (ref 0.0–0.1)
EOS%: 0.7 % (ref 0.0–7.0)
Eosinophils Absolute: 0 10*3/uL (ref 0.0–0.5)
HCT: 23.6 % — ABNORMAL LOW (ref 38.4–49.9)
HEMOGLOBIN: 8 g/dL — AB (ref 13.0–17.1)
LYMPH#: 0.2 10*3/uL — AB (ref 0.9–3.3)
LYMPH%: 96.6 % — ABNORMAL HIGH (ref 14.0–49.0)
MCH: 29.9 pg (ref 27.2–33.4)
MCHC: 33.7 g/dL (ref 32.0–36.0)
MCV: 88.6 fL (ref 79.3–98.0)
MONO#: 0 10*3/uL — ABNORMAL LOW (ref 0.1–0.9)
MONO%: 1.7 % (ref 0.0–14.0)
NEUT%: 1 % — ABNORMAL LOW (ref 39.0–75.0)
NEUTROS ABS: 0 10*3/uL — AB (ref 1.5–6.5)
Platelets: 24 10*3/uL — ABNORMAL LOW (ref 140–400)
RBC: 2.67 10*6/uL — ABNORMAL LOW (ref 4.20–5.82)
RDW: 15.7 % — AB (ref 11.0–14.6)
WBC: 0.2 10*3/uL — AB (ref 4.0–10.3)

## 2013-12-31 LAB — HOLD TUBE, BLOOD BANK

## 2013-12-31 MED ORDER — SODIUM CHLORIDE 0.9 % IJ SOLN: 3.0000 mL | INTRAMUSCULAR | Status: DC | PRN

## 2013-12-31 MED ORDER — SODIUM CHLORIDE 0.9 % IJ SOLN: 10.0000 mL | INTRAMUSCULAR | Status: DC | PRN

## 2013-12-31 MED ORDER — HEPARIN SOD (PORK) LOCK FLUSH 100 UNIT/ML IV SOLN: 250.0000 [IU] | INTRAVENOUS | Status: DC | PRN

## 2013-12-31 MED ORDER — HEPARIN SOD (PORK) LOCK FLUSH 100 UNIT/ML IV SOLN: 500.0000 [IU] | Freq: Every day | INTRAVENOUS | Status: DC | PRN

## 2013-12-31 MED ORDER — SODIUM CHLORIDE 0.9 % IV SOLN
250.0000 mL | Freq: Once | INTRAVENOUS | Status: AC
  Administered 2013-12-31: 250 mL via INTRAVENOUS

## 2013-12-31 NOTE — H&P (Signed)
The patient is here for transfusion only  

## 2013-12-31 NOTE — Progress Notes (Addendum)
Patient ID: David Carr., male   DOB: September 13, 1945, 68 y.o.   MRN: [id number] Diagnosis: Anemia in Neoplastic disease, Dr Alvy Bimler. Pt transfused with 2 units PRBC, no blood transfusion reaction noted. Pts port flushed and deaccessed. Pt discharged from center, ambulated by self out of clinic.

## 2013-12-31 NOTE — Progress Notes (Signed)
[facility] transfusion.  Orders released, blood bank states will be about 30 minutes for ready blood.  Pt updated.  Coolidge Breeze, RN 12/31/2013

## 2014-01-01 LAB — TYPE AND SCREEN
ABO/RH(D): A POS
Antibody Screen: NEGATIVE
UNIT DIVISION: 0
Unit division: 0

## 2014-01-02 ENCOUNTER — Other Ambulatory Visit (HOSPITAL_BASED_OUTPATIENT_CLINIC_OR_DEPARTMENT_OTHER): Payer: Managed Care, Other (non HMO)

## 2014-01-02 ENCOUNTER — Ambulatory Visit: Payer: Managed Care, Other (non HMO) | Admitting: Hematology and Oncology

## 2014-01-02 ENCOUNTER — Other Ambulatory Visit: Payer: Self-pay | Admitting: Hematology and Oncology

## 2014-01-02 ENCOUNTER — Non-Acute Institutional Stay (HOSPITAL_COMMUNITY)
Admission: AD | Admit: 2014-01-02 | Discharge: 2014-01-02 | Disposition: A | Discharge status: Home / Self Care | Payer: Managed Care, Other (non HMO) | Source: Ambulatory Visit | Attending: Hematology and Oncology | Admitting: Hematology and Oncology

## 2014-01-02 DIAGNOSIS — D696 Thrombocytopenia, unspecified: Secondary | ICD-10-CM

## 2014-01-02 DIAGNOSIS — C9201 Acute myeloblastic leukemia, in remission: Secondary | ICD-10-CM

## 2014-01-02 DIAGNOSIS — D469 Myelodysplastic syndrome, unspecified: Secondary | ICD-10-CM

## 2014-01-02 LAB — CBC WITH DIFFERENTIAL/PLATELET
BASO%: 0 % (ref 0.0–2.0)
Basophils Absolute: 0 10*3/uL (ref 0.0–0.1)
EOS%: 0.4 % (ref 0.0–7.0)
Eosinophils Absolute: 0 10*3/uL (ref 0.0–0.5)
HCT: 28.5 % — ABNORMAL LOW (ref 38.4–49.9)
HEMOGLOBIN: 9.4 g/dL — AB (ref 13.0–17.1)
LYMPH%: 96.2 % — ABNORMAL HIGH (ref 14.0–49.0)
MCH: 29.1 pg (ref 27.2–33.4)
MCHC: 33 g/dL (ref 32.0–36.0)
MCV: 88.2 fL (ref 79.3–98.0)
MONO#: 0 10*3/uL — ABNORMAL LOW (ref 0.1–0.9)
MONO%: 0.9 % (ref 0.0–14.0)
NEUT#: 0 10*3/uL — CL (ref 1.5–6.5)
NEUT%: 2.5 % — ABNORMAL LOW (ref 39.0–75.0)
Platelets: 13 10*3/uL — ABNORMAL LOW (ref 140–400)
RBC: 3.23 10*6/uL — ABNORMAL LOW (ref 4.20–5.82)
RDW: 15.7 % — AB (ref 11.0–14.6)
WBC: 0.2 10*3/uL — CL (ref 4.0–10.3)
lymph#: 0.2 10*3/uL — ABNORMAL LOW (ref 0.9–3.3)

## 2014-01-02 LAB — HOLD TUBE, BLOOD BANK

## 2014-01-02 MED ORDER — SODIUM CHLORIDE 0.9 % IJ SOLN: 3.0000 mL | INTRAMUSCULAR | Status: DC | PRN

## 2014-01-02 MED ORDER — HEPARIN SOD (PORK) LOCK FLUSH 100 UNIT/ML IV SOLN
500.0000 [IU] | Freq: Every day | INTRAVENOUS | Status: AC | PRN
  Administered 2014-01-02: 500 [IU]
  Filled 2014-01-02: qty 5

## 2014-01-02 MED ORDER — HEPARIN SOD (PORK) LOCK FLUSH 100 UNIT/ML IV SOLN: 250.0000 [IU] | INTRAVENOUS | Status: DC | PRN

## 2014-01-02 MED ORDER — SODIUM CHLORIDE 0.9 % IJ SOLN: 10.0000 mL | INTRAMUSCULAR | Status: DC | PRN

## 2014-01-02 MED ORDER — SODIUM CHLORIDE 0.9 % IV SOLN
250.0000 mL | Freq: Once | INTRAVENOUS | Status: AC
  Administered 2014-01-02: 250 mL via INTRAVENOUS

## 2014-01-02 NOTE — H&P (Signed)
The patient is here for transfusion only  

## 2014-01-02 NOTE — Procedures (Signed)
[facility]  Procedure Note  David Carr. WUJ:[id number] DOB: Aug 07, 1945 DOA: 01/02/2014   PCP: Joycelyn Man, MD   Associated Diagnosis: Thrombocytopenia  Procedure Note: Transfusion of 1 unit platelets   Condition During Procedure: Pt tolerated well   Condition at Discharge:  Pt alert, ambulatory, port deaccessed; no complications noted   Tamala Julian, Salley Slaughter, [facility]

## 2014-01-03 LAB — PREPARE PLATELET PHERESIS: Unit division: 0

## 2014-01-05 ENCOUNTER — Encounter: Payer: Self-pay | Admitting: *Deleted

## 2014-01-05 ENCOUNTER — Other Ambulatory Visit (HOSPITAL_BASED_OUTPATIENT_CLINIC_OR_DEPARTMENT_OTHER): Payer: Managed Care, Other (non HMO)

## 2014-01-05 DIAGNOSIS — D469 Myelodysplastic syndrome, unspecified: Secondary | ICD-10-CM

## 2014-01-05 DIAGNOSIS — C9201 Acute myeloblastic leukemia, in remission: Secondary | ICD-10-CM

## 2014-01-05 LAB — CBC WITH DIFFERENTIAL/PLATELET
BASO%: 0 % (ref 0.0–2.0)
Basophils Absolute: 0 10*3/uL (ref 0.0–0.1)
EOS%: 0 % (ref 0.0–7.0)
Eosinophils Absolute: 0 10*3/uL (ref 0.0–0.5)
HCT: 27.5 % — ABNORMAL LOW (ref 38.4–49.9)
HEMOGLOBIN: 9.4 g/dL — AB (ref 13.0–17.1)
LYMPH#: 0.3 10*3/uL — AB (ref 0.9–3.3)
LYMPH%: 62.2 % — ABNORMAL HIGH (ref 14.0–49.0)
MCH: 29.5 pg (ref 27.2–33.4)
MCHC: 34.2 g/dL (ref 32.0–36.0)
MCV: 86.2 fL (ref 79.3–98.0)
MONO#: 0 10*3/uL — ABNORMAL LOW (ref 0.1–0.9)
MONO%: 4.4 % (ref 0.0–14.0)
NEUT#: 0.2 10*3/uL — CL (ref 1.5–6.5)
NEUT%: 33.4 % — ABNORMAL LOW (ref 39.0–75.0)
Platelets: 22 10*3/uL — ABNORMAL LOW (ref 140–400)
RBC: 3.19 10*6/uL — AB (ref 4.20–5.82)
RDW: 14.5 % (ref 11.0–14.6)
WBC: 0.5 10*3/uL — CL (ref 4.0–10.3)

## 2014-01-05 LAB — COMPREHENSIVE METABOLIC PANEL (CC13)
ALK PHOS: 95 U/L (ref 40–150)
ALT: 11 U/L (ref 0–55)
AST: 12 U/L (ref 5–34)
Albumin: 3.5 g/dL (ref 3.5–5.0)
Anion Gap: 12 mEq/L — ABNORMAL HIGH (ref 3–11)
BILIRUBIN TOTAL: 0.48 mg/dL (ref 0.20–1.20)
BUN: 17.3 mg/dL (ref 7.0–26.0)
CO2: 23 mEq/L (ref 22–29)
CREATININE: 1 mg/dL (ref 0.7–1.3)
Calcium: 9.7 mg/dL (ref 8.4–10.4)
Chloride: 105 mEq/L (ref 98–109)
Glucose: 178 mg/dl — ABNORMAL HIGH (ref 70–140)
Potassium: 3.9 mEq/L (ref 3.5–5.1)
SODIUM: 140 meq/L (ref 136–145)
Total Protein: 7.3 g/dL (ref 6.4–8.3)

## 2014-01-05 LAB — HOLD TUBE, BLOOD BANK

## 2014-01-05 LAB — MAGNESIUM (CC13): MAGNESIUM: 2 mg/dL (ref 1.5–2.5)

## 2014-01-05 NOTE — Progress Notes (Signed)
Faxed lab results today to Dr Nadara Mustard at [facility] fax [phone].

## 2014-01-07 ENCOUNTER — Other Ambulatory Visit (HOSPITAL_BASED_OUTPATIENT_CLINIC_OR_DEPARTMENT_OTHER): Payer: Managed Care, Other (non HMO)

## 2014-01-07 DIAGNOSIS — D696 Thrombocytopenia, unspecified: Secondary | ICD-10-CM

## 2014-01-07 DIAGNOSIS — C9201 Acute myeloblastic leukemia, in remission: Secondary | ICD-10-CM

## 2014-01-07 DIAGNOSIS — D469 Myelodysplastic syndrome, unspecified: Secondary | ICD-10-CM

## 2014-01-07 LAB — CBC WITH DIFFERENTIAL/PLATELET
BASO%: 1.1 % (ref 0.0–2.0)
BASOS ABS: 0 10*3/uL (ref 0.0–0.1)
EOS%: 0.1 % (ref 0.0–7.0)
Eosinophils Absolute: 0 10*3/uL (ref 0.0–0.5)
HEMATOCRIT: 26.8 % — AB (ref 38.4–49.9)
HEMOGLOBIN: 8.9 g/dL — AB (ref 13.0–17.1)
LYMPH%: 24.6 % (ref 14.0–49.0)
MCH: 29.1 pg (ref 27.2–33.4)
MCHC: 33.1 g/dL (ref 32.0–36.0)
MCV: 87.8 fL (ref 79.3–98.0)
MONO#: 0.1 10*3/uL (ref 0.1–0.9)
MONO%: 6.4 % (ref 0.0–14.0)
NEUT#: 1 10*3/uL — ABNORMAL LOW (ref 1.5–6.5)
NEUT%: 67.8 % (ref 39.0–75.0)
PLATELETS: 17 10*3/uL — AB (ref 140–400)
RBC: 3.05 10*6/uL — ABNORMAL LOW (ref 4.20–5.82)
RDW: 15.3 % — ABNORMAL HIGH (ref 11.0–14.6)
WBC: 1.5 10*3/uL — ABNORMAL LOW (ref 4.0–10.3)
lymph#: 0.4 10*3/uL — ABNORMAL LOW (ref 0.9–3.3)

## 2014-01-07 LAB — HOLD TUBE, BLOOD BANK

## 2014-01-09 ENCOUNTER — Telehealth: Payer: Self-pay | Admitting: *Deleted

## 2014-01-09 ENCOUNTER — Other Ambulatory Visit: Payer: Self-pay | Admitting: *Deleted

## 2014-01-09 ENCOUNTER — Other Ambulatory Visit (HOSPITAL_BASED_OUTPATIENT_CLINIC_OR_DEPARTMENT_OTHER): Payer: Managed Care, Other (non HMO)

## 2014-01-09 ENCOUNTER — Ambulatory Visit (HOSPITAL_BASED_OUTPATIENT_CLINIC_OR_DEPARTMENT_OTHER): Payer: Managed Care, Other (non HMO)

## 2014-01-09 VITALS — BP 116/67 | HR 73 | Temp 98.4°F | Resp 16

## 2014-01-09 DIAGNOSIS — D630 Anemia in neoplastic disease: Secondary | ICD-10-CM | POA: Diagnosis present

## 2014-01-09 DIAGNOSIS — C9201 Acute myeloblastic leukemia, in remission: Secondary | ICD-10-CM

## 2014-01-09 DIAGNOSIS — C9200 Acute myeloblastic leukemia, not having achieved remission: Secondary | ICD-10-CM

## 2014-01-09 DIAGNOSIS — D469 Myelodysplastic syndrome, unspecified: Secondary | ICD-10-CM

## 2014-01-09 LAB — CBC WITH DIFFERENTIAL/PLATELET
BASO%: 0.5 % (ref 0.0–2.0)
BASOS ABS: 0 10*3/uL (ref 0.0–0.1)
EOS ABS: 0 10*3/uL (ref 0.0–0.5)
EOS%: 0.1 % (ref 0.0–7.0)
HCT: 24.8 % — ABNORMAL LOW (ref 38.4–49.9)
HEMOGLOBIN: 8.3 g/dL — AB (ref 13.0–17.1)
LYMPH%: 15.7 % (ref 14.0–49.0)
MCH: 29.1 pg (ref 27.2–33.4)
MCHC: 33.5 g/dL (ref 32.0–36.0)
MCV: 86.7 fL (ref 79.3–98.0)
MONO#: 0.3 10*3/uL (ref 0.1–0.9)
MONO%: 10.5 % (ref 0.0–14.0)
NEUT%: 73.2 % (ref 39.0–75.0)
NEUTROS ABS: 1.8 10*3/uL (ref 1.5–6.5)
Platelets: 24 10*3/uL — ABNORMAL LOW (ref 140–400)
RBC: 2.86 10*6/uL — ABNORMAL LOW (ref 4.20–5.82)
RDW: 15.1 % — AB (ref 11.0–14.6)
WBC: 2.4 10*3/uL — ABNORMAL LOW (ref 4.0–10.3)
lymph#: 0.4 10*3/uL — ABNORMAL LOW (ref 0.9–3.3)

## 2014-01-09 LAB — HOLD TUBE, BLOOD BANK

## 2014-01-09 MED ORDER — SODIUM CHLORIDE 0.9 % IV SOLN
250.0000 mL | Freq: Once | INTRAVENOUS | Status: AC
  Administered 2014-01-09: 250 mL via INTRAVENOUS

## 2014-01-09 MED ORDER — ACETAMINOPHEN 325 MG PO TABS
650.0000 mg | ORAL_TABLET | Freq: Once | ORAL | Status: AC
  Administered 2014-01-09: 650 mg via ORAL

## 2014-01-09 MED ORDER — HEPARIN SOD (PORK) LOCK FLUSH 100 UNIT/ML IV SOLN
500.0000 [IU] | Freq: Every day | INTRAVENOUS | Status: AC | PRN
  Administered 2014-01-09: 500 [IU]
  Filled 2014-01-09: qty 5

## 2014-01-09 MED ORDER — DIPHENHYDRAMINE HCL 25 MG PO CAPS
25.0000 mg | ORAL_CAPSULE | Freq: Once | ORAL | Status: AC
  Administered 2014-01-09: 25 mg via ORAL

## 2014-01-09 MED ORDER — DIPHENHYDRAMINE HCL 25 MG PO CAPS
ORAL_CAPSULE | ORAL | Status: AC
  Filled 2014-01-09: qty 1

## 2014-01-09 MED ORDER — SODIUM CHLORIDE 0.9 % IJ SOLN
10.0000 mL | INTRAMUSCULAR | Status: AC | PRN
  Administered 2014-01-09: 10 mL
  Filled 2014-01-09: qty 10

## 2014-01-09 MED ORDER — ACETAMINOPHEN 325 MG PO TABS
ORAL_TABLET | ORAL | Status: AC
  Filled 2014-01-09: qty 2

## 2014-01-09 NOTE — Telephone Encounter (Signed)
Called pt with appt for transfusion today. He will check in at registration for 1130 appt.

## 2014-01-09 NOTE — Telephone Encounter (Signed)
S/w pt in lobby.  Reviewed CBC.  His Hgb is 8.3.  Pt c/o feeling light headed.  Orders from Dr. Nadara Mustard at [facility] read for transfusion of 2 units PRBCs for Hgb less than 9.0.   Informed pt of order for 2 units of blood today.   He agreed.  Results faxed to [facility] at fax [phone].

## 2014-01-09 NOTE — Patient Instructions (Signed)

## 2014-01-10 LAB — TYPE AND SCREEN
ABO/RH(D): A POS
ANTIBODY SCREEN: NEGATIVE
UNIT DIVISION: 0
Unit division: 0

## 2014-01-12 ENCOUNTER — Other Ambulatory Visit (HOSPITAL_BASED_OUTPATIENT_CLINIC_OR_DEPARTMENT_OTHER): Payer: Managed Care, Other (non HMO)

## 2014-01-12 ENCOUNTER — Other Ambulatory Visit: Payer: Self-pay | Admitting: *Deleted

## 2014-01-12 DIAGNOSIS — D469 Myelodysplastic syndrome, unspecified: Secondary | ICD-10-CM

## 2014-01-12 DIAGNOSIS — C9201 Acute myeloblastic leukemia, in remission: Secondary | ICD-10-CM

## 2014-01-12 LAB — CBC WITH DIFFERENTIAL/PLATELET
BASO%: 0 % (ref 0.0–2.0)
Basophils Absolute: 0 10*3/uL (ref 0.0–0.1)
EOS%: 0 % (ref 0.0–7.0)
Eosinophils Absolute: 0 10*3/uL (ref 0.0–0.5)
HCT: 30.6 % — ABNORMAL LOW (ref 38.4–49.9)
HGB: 10.4 g/dL — ABNORMAL LOW (ref 13.0–17.1)
LYMPH#: 0.4 10*3/uL — AB (ref 0.9–3.3)
LYMPH%: 14 % (ref 14.0–49.0)
MCH: 28.5 pg (ref 27.2–33.4)
MCHC: 34 g/dL (ref 32.0–36.0)
MCV: 83.8 fL (ref 79.3–98.0)
MONO#: 0.5 10*3/uL (ref 0.1–0.9)
MONO%: 15.6 % — ABNORMAL HIGH (ref 0.0–14.0)
NEUT%: 70.4 % (ref 39.0–75.0)
NEUTROS ABS: 2.1 10*3/uL (ref 1.5–6.5)
PLATELETS: 19 10*3/uL — AB (ref 140–400)
RBC: 3.65 10*6/uL — AB (ref 4.20–5.82)
RDW: 14.5 % (ref 11.0–14.6)
WBC: 3 10*3/uL — AB (ref 4.0–10.3)

## 2014-01-12 LAB — TECHNOLOGIST REVIEW

## 2014-01-12 LAB — HOLD TUBE, BLOOD BANK

## 2014-01-13 ENCOUNTER — Telehealth: Payer: Self-pay | Admitting: Hematology and Oncology

## 2014-01-13 ENCOUNTER — Telehealth: Payer: Self-pay | Admitting: *Deleted

## 2014-01-13 NOTE — Telephone Encounter (Signed)
Rogene, RN from [facility], called to ask if pt had gotten Platelets yesterday for count of 19?  Informed her that Dr. Alvy Bimler did review his cbc and did not order platelets yesterday.  We will recheck him on Thursday this week as requested.

## 2014-01-13 NOTE — Telephone Encounter (Signed)
per pof to sch pt appt-cld & spoke to pt to adv of times & dates-pt understood

## 2014-01-13 NOTE — Telephone Encounter (Signed)
Pt left VM states he will not be admitted to [facility] this Thursday.   He needs lab appt here Thurs 12/03,  Mon 12/07 and Wed 12/09.   Request sent to scheduler.  Informed pt we will schedule labs and let him know times.  He verbalized understanding and states [facility] plans to admit him next Thurs 12/10 if his labs are ok next week.

## 2014-01-14 ENCOUNTER — Other Ambulatory Visit: Payer: Self-pay | Admitting: *Deleted

## 2014-01-14 ENCOUNTER — Other Ambulatory Visit: Payer: Managed Care, Other (non HMO)

## 2014-01-15 ENCOUNTER — Other Ambulatory Visit (HOSPITAL_BASED_OUTPATIENT_CLINIC_OR_DEPARTMENT_OTHER): Payer: Managed Care, Other (non HMO)

## 2014-01-15 ENCOUNTER — Other Ambulatory Visit: Payer: Self-pay | Admitting: *Deleted

## 2014-01-15 DIAGNOSIS — C9201 Acute myeloblastic leukemia, in remission: Secondary | ICD-10-CM

## 2014-01-15 DIAGNOSIS — D469 Myelodysplastic syndrome, unspecified: Secondary | ICD-10-CM

## 2014-01-15 LAB — COMPREHENSIVE METABOLIC PANEL (CC13)
ALK PHOS: 84 U/L (ref 40–150)
ALT: 16 U/L (ref 0–55)
AST: 18 U/L (ref 5–34)
Albumin: 3.4 g/dL — ABNORMAL LOW (ref 3.5–5.0)
Anion Gap: 11 mEq/L (ref 3–11)
BILIRUBIN TOTAL: 0.35 mg/dL (ref 0.20–1.20)
BUN: 15.9 mg/dL (ref 7.0–26.0)
CO2: 25 mEq/L (ref 22–29)
Calcium: 9.3 mg/dL (ref 8.4–10.4)
Chloride: 106 mEq/L (ref 98–109)
Creatinine: 0.9 mg/dL (ref 0.7–1.3)
EGFR: 86 mL/min/{1.73_m2} — ABNORMAL LOW (ref >90)
Glucose: 176 mg/dl — ABNORMAL HIGH (ref 70–140)
Potassium: 3.9 mEq/L (ref 3.5–5.1)
SODIUM: 142 meq/L (ref 136–145)
TOTAL PROTEIN: 7 g/dL (ref 6.4–8.3)

## 2014-01-15 LAB — CBC WITH DIFFERENTIAL/PLATELET
BASO%: 0.3 % (ref 0.0–2.0)
Basophils Absolute: 0 10*3/uL (ref 0.0–0.1)
EOS%: 0 % (ref 0.0–7.0)
Eosinophils Absolute: 0 10*3/uL (ref 0.0–0.5)
HCT: 30.3 % — ABNORMAL LOW (ref 38.4–49.9)
HGB: 10.4 g/dL — ABNORMAL LOW (ref 13.0–17.1)
LYMPH#: 0.5 10*3/uL — AB (ref 0.9–3.3)
LYMPH%: 14 % (ref 14.0–49.0)
MCH: 29.1 pg (ref 27.2–33.4)
MCHC: 34.3 g/dL (ref 32.0–36.0)
MCV: 84.6 fL (ref 79.3–98.0)
MONO#: 0.6 10*3/uL (ref 0.1–0.9)
MONO%: 19.5 % — AB (ref 0.0–14.0)
NEUT%: 66.2 % (ref 39.0–75.0)
NEUTROS ABS: 2.2 10*3/uL (ref 1.5–6.5)
Platelets: 45 10*3/uL — ABNORMAL LOW (ref 140–400)
RBC: 3.58 10*6/uL — AB (ref 4.20–5.82)
RDW: 14.5 % (ref 11.0–14.6)
WBC: 3.3 10*3/uL — ABNORMAL LOW (ref 4.0–10.3)

## 2014-01-15 LAB — MAGNESIUM (CC13): Magnesium: 2.2 mg/dl (ref 1.5–2.5)

## 2014-01-15 LAB — HOLD TUBE, BLOOD BANK

## 2014-01-15 NOTE — Progress Notes (Signed)
Faxed lab results to [facility] Dr. Nadara Mustard at fax [phone].

## 2014-01-16 ENCOUNTER — Other Ambulatory Visit: Payer: Managed Care, Other (non HMO)

## 2014-01-16 MED ORDER — CYANOCOBALAMIN 1000 MCG/ML IJ SOLN
INTRAMUSCULAR | Status: AC
  Filled 2014-01-16: qty 1

## 2014-01-19 ENCOUNTER — Other Ambulatory Visit (HOSPITAL_BASED_OUTPATIENT_CLINIC_OR_DEPARTMENT_OTHER): Payer: Managed Care, Other (non HMO)

## 2014-01-19 ENCOUNTER — Ambulatory Visit (HOSPITAL_COMMUNITY)
Admission: RE | Admit: 2014-01-19 | Discharge: 2014-01-19 | Disposition: A | Discharge status: Home / Self Care | Payer: Managed Care, Other (non HMO) | Source: Ambulatory Visit | Attending: Hematology and Oncology | Admitting: Hematology and Oncology

## 2014-01-19 ENCOUNTER — Telehealth: Payer: Self-pay | Admitting: *Deleted

## 2014-01-19 ENCOUNTER — Encounter: Payer: Self-pay | Admitting: Hematology and Oncology

## 2014-01-19 ENCOUNTER — Telehealth: Payer: Self-pay | Admitting: Hematology and Oncology

## 2014-01-19 ENCOUNTER — Other Ambulatory Visit: Payer: Self-pay | Admitting: Hematology and Oncology

## 2014-01-19 ENCOUNTER — Ambulatory Visit (HOSPITAL_BASED_OUTPATIENT_CLINIC_OR_DEPARTMENT_OTHER): Payer: Managed Care, Other (non HMO)

## 2014-01-19 DIAGNOSIS — R6883 Chills (without fever): Secondary | ICD-10-CM | POA: Insufficient documentation

## 2014-01-19 DIAGNOSIS — R5383 Other fatigue: Secondary | ICD-10-CM | POA: Insufficient documentation

## 2014-01-19 DIAGNOSIS — C9201 Acute myeloblastic leukemia, in remission: Secondary | ICD-10-CM

## 2014-01-19 LAB — CBC WITH DIFFERENTIAL/PLATELET
BASO%: 0.3 % (ref 0.0–2.0)
Basophils Absolute: 0 10*3/uL (ref 0.0–0.1)
EOS%: 0 % (ref 0.0–7.0)
Eosinophils Absolute: 0 10*3/uL (ref 0.0–0.5)
HCT: 29.4 % — ABNORMAL LOW (ref 38.4–49.9)
HGB: 9.8 g/dL — ABNORMAL LOW (ref 13.0–17.1)
LYMPH#: 0.4 10*3/uL — AB (ref 0.9–3.3)
LYMPH%: 12 % — AB (ref 14.0–49.0)
MCH: 28.6 pg (ref 27.2–33.4)
MCHC: 33.3 g/dL (ref 32.0–36.0)
MCV: 85.7 fL (ref 79.3–98.0)
MONO#: 0.4 10*3/uL (ref 0.1–0.9)
MONO%: 14.3 % — ABNORMAL HIGH (ref 0.0–14.0)
NEUT#: 2.2 10*3/uL (ref 1.5–6.5)
NEUT%: 73.4 % (ref 39.0–75.0)
PLATELETS: 87 10*3/uL — AB (ref 140–400)
RBC: 3.43 10*6/uL — ABNORMAL LOW (ref 4.20–5.82)
RDW: 14.8 % — ABNORMAL HIGH (ref 11.0–14.6)
WBC: 3 10*3/uL — AB (ref 4.0–10.3)
nRBC: 3 % — ABNORMAL HIGH (ref 0–0)

## 2014-01-19 LAB — URINALYSIS, MICROSCOPIC - CHCC
Bilirubin (Urine): NEGATIVE
Blood: NEGATIVE
Glucose: 100 mg/dL
Ketones: NEGATIVE mg/dL
Leukocyte Esterase: NEGATIVE
Nitrite: NEGATIVE
PROTEIN: 30 mg/dL
SPECIFIC GRAVITY, URINE: 1.03 (ref 1.003–1.035)
UROBILINOGEN UR: 0.2 mg/dL (ref 0.2–1)
pH: 6 (ref 4.6–8.0)

## 2014-01-19 LAB — TSH CHCC: TSH: 3.206 m[IU]/L (ref 0.320–4.118)

## 2014-01-19 LAB — COMPREHENSIVE METABOLIC PANEL (CC13)
ALBUMIN: 3.4 g/dL — AB (ref 3.5–5.0)
ALT: 25 U/L (ref 0–55)
AST: 21 U/L (ref 5–34)
Alkaline Phosphatase: 75 U/L (ref 40–150)
Anion Gap: 9 mEq/L (ref 3–11)
BUN: 11.7 mg/dL (ref 7.0–26.0)
CALCIUM: 9.2 mg/dL (ref 8.4–10.4)
CHLORIDE: 105 meq/L (ref 98–109)
CO2: 27 mEq/L (ref 22–29)
Creatinine: 1 mg/dL (ref 0.7–1.3)
EGFR: 81 mL/min/{1.73_m2} — AB (ref >90)
GLUCOSE: 217 mg/dL — AB (ref 70–140)
POTASSIUM: 4.3 meq/L (ref 3.5–5.1)
Sodium: 140 mEq/L (ref 136–145)
Total Bilirubin: 0.44 mg/dL (ref 0.20–1.20)
Total Protein: 6.6 g/dL (ref 6.4–8.3)

## 2014-01-19 LAB — MAGNESIUM (CC13): Magnesium: 2.1 mg/dl (ref 1.5–2.5)

## 2014-01-19 LAB — HOLD TUBE, BLOOD BANK

## 2014-01-19 LAB — T4, FREE: FREE T4: 0.83 ng/dL (ref 0.80–1.80)

## 2014-01-19 NOTE — Telephone Encounter (Signed)
S/w pt in lobby this morning.  Gave him copy of lab work.  No need for transfusions.  Pt c/o having a few episodes of "feeling cold" this weekend.  Denies fever but states has had some tightness in his chest.  He reports he feels "weird" and thinks something is not right.   Reported to Dr. Alvy Bimler and she ordered blood and urine cultures, thyroid levels and chest xray.  She can see pt on Wednesday to review results.  Instructed pt on labs and CXR and Dr. Alvy Bimler will see him on Wednesday.  Instructed him to call if he develops fever or any worsening symptoms before Wednesday.  He verbalized understanding.  Faxed labs CBC, CMET and Magnesium to Dr. Nadara Mustard at [facility] fax [phone]

## 2014-01-20 LAB — URINE CULTURE

## 2014-01-21 ENCOUNTER — Encounter: Payer: Self-pay | Admitting: Hematology and Oncology

## 2014-01-21 ENCOUNTER — Encounter: Payer: Self-pay | Admitting: *Deleted

## 2014-01-21 ENCOUNTER — Other Ambulatory Visit: Payer: Managed Care, Other (non HMO)

## 2014-01-21 ENCOUNTER — Telehealth: Payer: Self-pay | Admitting: Hematology and Oncology

## 2014-01-21 ENCOUNTER — Ambulatory Visit (HOSPITAL_BASED_OUTPATIENT_CLINIC_OR_DEPARTMENT_OTHER): Payer: Managed Care, Other (non HMO) | Admitting: Hematology and Oncology

## 2014-01-21 ENCOUNTER — Ambulatory Visit (HOSPITAL_BASED_OUTPATIENT_CLINIC_OR_DEPARTMENT_OTHER): Payer: Managed Care, Other (non HMO)

## 2014-01-21 VITALS — BP 137/74 | HR 83 | Temp 98.4°F | Resp 18 | Ht 68.75 in | Wt 195.1 lb

## 2014-01-21 DIAGNOSIS — R0789 Other chest pain: Secondary | ICD-10-CM

## 2014-01-21 DIAGNOSIS — D469 Myelodysplastic syndrome, unspecified: Secondary | ICD-10-CM

## 2014-01-21 DIAGNOSIS — D6959 Other secondary thrombocytopenia: Secondary | ICD-10-CM

## 2014-01-21 DIAGNOSIS — T50905A Adverse effect of unspecified drugs, medicaments and biological substances, initial encounter: Secondary | ICD-10-CM

## 2014-01-21 DIAGNOSIS — C9201 Acute myeloblastic leukemia, in remission: Secondary | ICD-10-CM

## 2014-01-21 DIAGNOSIS — R6883 Chills (without fever): Secondary | ICD-10-CM

## 2014-01-21 DIAGNOSIS — D630 Anemia in neoplastic disease: Secondary | ICD-10-CM

## 2014-01-21 LAB — CBC WITH DIFFERENTIAL/PLATELET
BASO%: 0.4 % (ref 0.0–2.0)
Basophils Absolute: 0 10*3/uL (ref 0.0–0.1)
EOS ABS: 0 10*3/uL (ref 0.0–0.5)
EOS%: 0 % (ref 0.0–7.0)
HCT: 29.2 % — ABNORMAL LOW (ref 38.4–49.9)
HGB: 9.8 g/dL — ABNORMAL LOW (ref 13.0–17.1)
LYMPH#: 0.3 10*3/uL — AB (ref 0.9–3.3)
LYMPH%: 12 % — ABNORMAL LOW (ref 14.0–49.0)
MCH: 29.2 pg (ref 27.2–33.4)
MCHC: 33.6 g/dL (ref 32.0–36.0)
MCV: 86.9 fL (ref 79.3–98.0)
MONO#: 0.4 10*3/uL (ref 0.1–0.9)
MONO%: 13.4 % (ref 0.0–14.0)
NEUT%: 74.2 % (ref 39.0–75.0)
NEUTROS ABS: 2.1 10*3/uL (ref 1.5–6.5)
NRBC: 7 % — AB (ref 0–0)
Platelets: 111 10*3/uL — ABNORMAL LOW (ref 140–400)
RBC: 3.36 10*6/uL — AB (ref 4.20–5.82)
RDW: 15.8 % — AB (ref 11.0–14.6)
WBC: 2.8 10*3/uL — AB (ref 4.0–10.3)

## 2014-01-21 LAB — HOLD TUBE, BLOOD BANK

## 2014-01-21 NOTE — Progress Notes (Signed)
Faxed labs and office note from today to Dr. Nadara Mustard at [facility] fax [phone].  Planned admit there tomorrow 01/22/14 for chemotherapy.

## 2014-01-21 NOTE — Assessment & Plan Note (Signed)
This is improving. He does not need transfusion today.

## 2014-01-21 NOTE — Assessment & Plan Note (Signed)
He has chest discomfort which is atypical and not associated with angina. I suspect it could be due to mild irregular heartbeat, transient in nature, could be related to irritation from his port. He was able to exercise yesterday without symptoms but he was aggravated when he took a shower and with positional change of his hands when he reached out to hang Christmas lights. He does not have any signs of arrhythmia on exam and I reassured the patient.

## 2014-01-21 NOTE — Assessment & Plan Note (Signed)
He tolerated treatment well. He will return to [facility] tomorrow for cycle 3 of consolidation chemotherapy.

## 2014-01-21 NOTE — Telephone Encounter (Signed)
Per MD lab added back on.... KJ

## 2014-01-21 NOTE — Progress Notes (Signed)
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 He is seen today because he developed some chills without fever and 2 episodes of atypical chest discomfort. According to him, first episode was when he had a shower and the second episode is when he reached out his hands to hang the Christmas lights. He was a weird sensation that lasted several minutes. Subsequently, he was able to walk his dog and exercise without symptoms.  REVIEW OF SYSTEMS:   Constitutional: Denies fevers, abnormal weight loss Eyes: Denies blurriness of vision Ears, nose, mouth, throat, and face: Denies mucositis or sore throat Respiratory: Denies cough, dyspnea or wheezes Gastrointestinal:  Denies nausea, heartburn or change in bowel habits Skin: Denies abnormal skin rashes Lymphatics: Denies new lymphadenopathy or easy bruising Neurological:Denies numbness, tingling or new weaknesses Behavioral/Psych: Mood is stable, no new changes  All other systems were reviewed with the patient and are negative.  I have reviewed the past medical history, past surgical history, social history and family history with the patient and they are unchanged from previous note.  ALLERGIES:  is allergic to morphine and related.  MEDICATIONS:  Current Outpatient Prescriptions  Medication Sig Dispense  Refill  . atenolol (TENORMIN) 25 MG tablet Take 12.5 mg by mouth every morning.    Marland Kitchen glucose blood (FREESTYLE LITE) test strip Test once daily dx 250.00 100 each 3  . insulin lispro protamine-lispro (HUMALOG 75/25 MIX) (75-25) 100 UNIT/ML SUSP injection Inject 15 Units into the skin at bedtime. 3 vial 11  . INSULIN SYRINGE .5CC/29G (B-D INSULIN SYRINGE) 29G X 1/2" 0.5 ML MISC Use once daily, dx 250.00 100 each 3  . simvastatin (ZOCOR) 20 MG tablet Take 1 tablet (20 mg total) by mouth at bedtime. 100 tablet 3  . ondansetron (ZOFRAN) 8 MG tablet Take 8 mg by mouth as needed.     No current facility-administered medications for this visit.    PHYSICAL EXAMINATION: ECOG PERFORMANCE STATUS: 1 - Symptomatic but completely ambulatory  Filed Vitals:   01/21/14 0923  BP: 137/74  Pulse: 83  Temp: 98.4 F (36.9 C)  Resp: 18   Filed Weights   01/21/14 0923  Weight: 195 lb 1.6 oz (88.497 kg)    GENERAL:alert, no distress and comfortable SKIN: skin color, texture, turgor are normal, no rashes or significant lesions EYES: normal, Conjunctiva are pink and non-injected, sclera clear OROPHARYNX:no exudate, no erythema and lips, buccal mucosa, and tongue normal  NECK: supple, thyroid normal size, non-tender, without nodularity LYMPH:  no palpable lymphadenopathy in the cervical, axillary or inguinal LUNGS: clear to auscultation and percussion with normal breathing effort HEART: regular rate & rhythm with soft systolic murmurs and no lower extremity edema ABDOMEN:abdomen soft, non-tender and normal bowel sounds Musculoskeletal:no cyanosis of digits and no clubbing  NEURO: alert & oriented x 3 with fluent speech, no focal motor/sensory deficits  LABORATORY DATA:  I have reviewed the data as listed    Component Value Date/Time   NA 140 01/19/2014 0947   NA 139 08/22/2013 0758   K 4.3 01/19/2014 0947   K 3.9 08/22/2013 0758   CL 104 08/22/2013 0758   CO2 27 01/19/2014 0947   CO2 22  08/22/2013 0758   GLUCOSE 217* 01/19/2014 0947   GLUCOSE 120* 08/22/2013 0758   BUN 11.7 01/19/2014 0947   BUN 19 08/22/2013 0758   CREATININE 1.0 01/19/2014 0947   CREATININE 0.9 08/22/2013 0758   CALCIUM 9.2 01/19/2014 0947   CALCIUM 8.8 08/22/2013 0758   PROT 6.6 01/19/2014 0947   PROT 6.6 08/22/2013 0758   ALBUMIN 3.4* 01/19/2014 0947   ALBUMIN 3.8 08/22/2013 0758   AST 21 01/19/2014 0947   AST 23 08/22/2013 0758   ALT 25 01/19/2014 0947   ALT 17 08/22/2013 0758   ALKPHOS 75 01/19/2014 0947   ALKPHOS 41 08/22/2013 0758   BILITOT 0.44 01/19/2014 0947   BILITOT 0.8 08/22/2013 0758   GFRNONAA 71.09 01/24/2010 0753   GFRAA 78 01/20/2008 0755    No results found for: SPEP, UPEP  Lab Results  Component Value Date   WBC 2.8* 01/21/2014   NEUTROABS 2.1 01/21/2014   HGB 9.8* 01/21/2014   HCT 29.2* 01/21/2014   MCV 86.9 01/21/2014   PLT 111* 01/21/2014      Chemistry      Component Value Date/Time   NA 140 01/19/2014 0947   NA 139 08/22/2013 0758   K 4.3 01/19/2014 0947   K 3.9 08/22/2013 0758   CL 104 08/22/2013 0758   CO2 27 01/19/2014 0947   CO2 22 08/22/2013 0758   BUN 11.7 01/19/2014 0947   BUN 19 08/22/2013 0758   CREATININE 1.0 01/19/2014 0947   CREATININE 0.9 08/22/2013 0758      Component Value Date/Time   CALCIUM 9.2 01/19/2014 0947   CALCIUM 8.8 08/22/2013 0758   ALKPHOS 75 01/19/2014 0947   ALKPHOS 41 08/22/2013 0758   AST 21 01/19/2014 0947   AST 23 08/22/2013 0758   ALT 25 01/19/2014 0947   ALT 17 08/22/2013 0758   BILITOT 0.44 01/19/2014 0947   BILITOT 0.8 08/22/2013 0758       RADIOGRAPHIC STUDIES: I have personally reviewed the radiological images as listed and agreed with the findings in the report. Dg Chest 2 View  01/19/2014   CLINICAL DATA:  Chills without fever.  Fatigue.  AML in remission.  EXAM: CHEST  2 VIEW  COMPARISON:  None.  FINDINGS: The heart size is normal. A right IJ double lumen Port-A-Cath is in satisfactory position.  The lungs are clear. Mild degenerative changes are noted in the thoracic spine.  IMPRESSION: 1. No acute cardiopulmonary disease. 2. Mild degenerative changes of the thoracic spine.   Electronically Signed   By: Lawrence Santiago M.D.   On: 01/19/2014 15:17     ASSESSMENT & PLAN:  AML (acute myeloid leukemia) in remission He tolerated treatment well. He will return to [facility] tomorrow for cycle 3 of consolidation chemotherapy.  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 He does not need transfusion today.  Thrombocytopenia due to drugs This is improving. He does not need transfusion today.  Chest pain He has chest discomfort which is atypical and not associated with angina. I suspect it could be due to mild irregular heartbeat, transient in nature, could be related to irritation from his port. He was able  to exercise yesterday without symptoms but he was aggravated when he took a shower and with positional change of his hands when he reached out to hang Christmas lights. He does not have any signs of arrhythmia on exam and I reassured the patient.  Chills (without fever) Cultures were negative. I reassured the patient. He has completed a course of prophylactic antimicrobial therapy recently.   No orders of the defined types were placed in this encounter.   All questions were answered. The patient knows to call the clinic with any problems, questions or concerns. No barriers to learning was detected. I spent 25 minutes counseling the patient face to face. The total time spent in the appointment was 30 minutes and more than 50% was on counseling and review of test results     [facility], [place], MD 01/21/2014 9:44 AM

## 2014-01-21 NOTE — Assessment & Plan Note (Signed)
Cultures were negative. I reassured the patient. He has completed a course of prophylactic antimicrobial therapy recently.

## 2014-01-22 ENCOUNTER — Telehealth: Payer: Self-pay | Admitting: *Deleted

## 2014-01-22 NOTE — Telephone Encounter (Signed)
Received VM and orders from [facility], Dr. Lyman Speller office.  They request pt have Lab and Neulasta 6 mg on next Thursday 01/29/14 along w/ labs following week on Mon, Wed and Friday (we are closed Friday).  CBC, CMP and Magnesium on Mondays and CBC on Wednesdays and Fridays.  Transfuse Platelets for count less than 20 and Two units of blood for Hgb less than 9.0.  Orders held at nurse's desk.   Baron Sane, RN requests call back w/ appts times at phone [phone]

## 2014-01-23 ENCOUNTER — Encounter: Payer: Self-pay | Admitting: *Deleted

## 2014-01-25 LAB — CULTURE, BLOOD (SINGLE)

## 2014-01-26 ENCOUNTER — Other Ambulatory Visit: Payer: Self-pay | Admitting: Hematology and Oncology

## 2014-01-26 ENCOUNTER — Telehealth: Payer: Self-pay | Admitting: Hematology and Oncology

## 2014-01-26 ENCOUNTER — Telehealth: Payer: Self-pay | Admitting: *Deleted

## 2014-01-26 DIAGNOSIS — C9201 Acute myeloblastic leukemia, in remission: Secondary | ICD-10-CM

## 2014-01-26 NOTE — Telephone Encounter (Signed)
Left VM for RN, Baron Sane, [phone], at [facility].   Informed her pt scheduled here for lab/MD/injection on Friday 12/18.  Dr. Alvy Bimler did not have any room to see pt on 12/17 as requested.

## 2014-01-26 NOTE — Telephone Encounter (Signed)
I have to see patient but no opening until 12/18. I will place orders and POF to see him on 12/18 with labs, MD office visit and inj afterwards Thanks

## 2014-01-26 NOTE — Telephone Encounter (Signed)
s.w. pt and advised on DEC appt.....pt ok adn aware °

## 2014-01-29 ENCOUNTER — Ambulatory Visit: Payer: Managed Care, Other (non HMO) | Admitting: Hematology and Oncology

## 2014-01-30 ENCOUNTER — Other Ambulatory Visit (HOSPITAL_BASED_OUTPATIENT_CLINIC_OR_DEPARTMENT_OTHER): Payer: Managed Care, Other (non HMO)

## 2014-01-30 ENCOUNTER — Ambulatory Visit (HOSPITAL_BASED_OUTPATIENT_CLINIC_OR_DEPARTMENT_OTHER): Payer: Managed Care, Other (non HMO) | Admitting: Hematology and Oncology

## 2014-01-30 ENCOUNTER — Telehealth: Payer: Self-pay | Admitting: *Deleted

## 2014-01-30 ENCOUNTER — Encounter: Payer: Self-pay | Admitting: Hematology and Oncology

## 2014-01-30 ENCOUNTER — Telehealth: Payer: Self-pay | Admitting: Hematology and Oncology

## 2014-01-30 ENCOUNTER — Ambulatory Visit (HOSPITAL_BASED_OUTPATIENT_CLINIC_OR_DEPARTMENT_OTHER): Payer: Managed Care, Other (non HMO)

## 2014-01-30 VITALS — BP 143/77 | HR 69 | Temp 98.2°F | Resp 20 | Ht 68.75 in | Wt 197.9 lb

## 2014-01-30 DIAGNOSIS — D469 Myelodysplastic syndrome, unspecified: Secondary | ICD-10-CM

## 2014-01-30 DIAGNOSIS — Z5189 Encounter for other specified aftercare: Secondary | ICD-10-CM

## 2014-01-30 DIAGNOSIS — D72819 Decreased white blood cell count, unspecified: Secondary | ICD-10-CM

## 2014-01-30 DIAGNOSIS — D630 Anemia in neoplastic disease: Secondary | ICD-10-CM

## 2014-01-30 DIAGNOSIS — C9201 Acute myeloblastic leukemia, in remission: Secondary | ICD-10-CM

## 2014-01-30 DIAGNOSIS — D701 Agranulocytosis secondary to cancer chemotherapy: Secondary | ICD-10-CM

## 2014-01-30 DIAGNOSIS — D6959 Other secondary thrombocytopenia: Secondary | ICD-10-CM

## 2014-01-30 DIAGNOSIS — T451X5A Adverse effect of antineoplastic and immunosuppressive drugs, initial encounter: Secondary | ICD-10-CM

## 2014-01-30 DIAGNOSIS — T50905A Adverse effect of unspecified drugs, medicaments and biological substances, initial encounter: Secondary | ICD-10-CM

## 2014-01-30 LAB — CBC WITH DIFFERENTIAL/PLATELET
BASO%: 0.4 % (ref 0.0–2.0)
Basophils Absolute: 0 10*3/uL (ref 0.0–0.1)
EOS%: 0.1 % (ref 0.0–7.0)
Eosinophils Absolute: 0 10*3/uL (ref 0.0–0.5)
HCT: 30.2 % — ABNORMAL LOW (ref 38.4–49.9)
HGB: 10 g/dL — ABNORMAL LOW (ref 13.0–17.1)
LYMPH#: 0.2 10*3/uL — AB (ref 0.9–3.3)
LYMPH%: 8.7 % — ABNORMAL LOW (ref 14.0–49.0)
MCH: 29 pg (ref 27.2–33.4)
MCHC: 33.2 g/dL (ref 32.0–36.0)
MCV: 87.4 fL (ref 79.3–98.0)
MONO#: 0 10*3/uL — AB (ref 0.1–0.9)
MONO%: 0.5 % (ref 0.0–14.0)
NEUT#: 1.7 10*3/uL (ref 1.5–6.5)
NEUT%: 90.3 % — AB (ref 39.0–75.0)
Platelets: 51 10*3/uL — ABNORMAL LOW (ref 140–400)
RBC: 3.45 10*6/uL — AB (ref 4.20–5.82)
RDW: 15.3 % — ABNORMAL HIGH (ref 11.0–14.6)
WBC: 1.9 10*3/uL — AB (ref 4.0–10.3)

## 2014-01-30 LAB — HOLD TUBE, BLOOD BANK

## 2014-01-30 MED ORDER — PEGFILGRASTIM INJECTION 6 MG/0.6ML
6.0000 mg | Freq: Once | SUBCUTANEOUS | Status: AC
  Administered 2014-01-30: 6 mg via SUBCUTANEOUS
  Filled 2014-01-30: qty 0.6

## 2014-01-30 NOTE — Telephone Encounter (Signed)
Faxed labs to Dr. Nadara Mustard at [facility] fax [phone]

## 2014-01-30 NOTE — Telephone Encounter (Signed)
OK - thanks

## 2014-01-30 NOTE — Assessment & Plan Note (Signed)
He tolerated treatment well. He will return to [facility] on 03/05/14 for cycle 4 of consolidation chemotherapy. Continue supportive care

## 2014-01-30 NOTE — Assessment & Plan Note (Signed)
This is likely due to recent treatment. The patient denies recent history of bleeding such as epistaxis, hematuria or hematochezia. He is asymptomatic from the anemia. I will observe for now.   Per protocol/instrucion from [facility] will transfuse 2 units of irradiated blood if hemoglobin is less than 8 g.

## 2014-01-30 NOTE — Assessment & Plan Note (Signed)
This is likely due to recent treatment. The patient denies recent history of bleeding such as epistaxis, hematuria or hematochezia. He is asymptomatic from the low platelet count. I will observe for now.   Per protocol/instrucion from [facility] will transfuse 1 unit of irradiated platelets if platelet count is less than 20,000 or bleeding.

## 2014-01-30 NOTE — Progress Notes (Signed)
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, NPM1 positive   09/17/2013 -  Chemotherapy He was given induction chemotherapy at [facility] (7+3)   10/01/2013 -  Chemotherapy He had repeat induction chemotherapy due to persistent disease (5+2)   11/06/2013 Bone Marrow Biopsy Bone marrow biopsy confirmed remission   11/13/2013 - 11/17/2013 Chemotherapy He received cycle 1 of consolidation chemotherapy with HiDAC   12/18/2013 - 12/22/2013 Chemotherapy He received cycle 2 of consolidation chemotherapy with HiDAC   01/23/2014 - 01/28/2014 Chemotherapy he received cycle 2 of consolidation chemotherapy with HiDAC    INTERVAL HISTORY: Please see below for problem oriented charting. He is seen for supportive care visit. He feels fine. He had extensive cardiac testing at [facility] without significant findings.  REVIEW OF SYSTEMS:   Constitutional: Denies fevers, chills or abnormal weight loss Eyes: Denies blurriness of vision Ears, nose, mouth, throat, and face: Denies mucositis or sore throat Respiratory: Denies cough, dyspnea or wheezes Cardiovascular: Denies palpitation, chest discomfort or lower extremity swelling Gastrointestinal:  Denies nausea, heartburn or change in bowel habits Skin: Denies abnormal skin rashes Lymphatics: Denies new lymphadenopathy or easy bruising Neurological:Denies numbness, tingling or new weaknesses Behavioral/Psych: Mood is stable, no new changes  All other systems were reviewed with the patient and are negative.  I have reviewed the past medical history, past surgical history, social history and family history with the patient and they are unchanged from previous note.  ALLERGIES:  is allergic to morphine and related.  MEDICATIONS:  Current Outpatient  Prescriptions  Medication Sig Dispense Refill  . atenolol (TENORMIN) 25 MG tablet Take 12.5 mg by mouth every morning.    Marland Kitchen glucose blood (FREESTYLE LITE) test strip Test once daily dx 250.00 100 each 3  . insulin lispro protamine-lispro (HUMALOG 75/25 MIX) (75-25) 100 UNIT/ML SUSP injection Inject 15 Units into the skin at bedtime. 3 vial 11  . INSULIN SYRINGE .5CC/29G (B-D INSULIN SYRINGE) 29G X 1/2" 0.5 ML MISC Use once daily, dx 250.00 100 each 3  . ondansetron (ZOFRAN) 8 MG tablet Take 8 mg by mouth as needed.    . simvastatin (ZOCOR) 20 MG tablet Take 1 tablet (20 mg total) by mouth at bedtime. 100 tablet 3   No current facility-administered medications for this visit.    PHYSICAL EXAMINATION: ECOG PERFORMANCE STATUS: 0 - Asymptomatic  Filed Vitals:   01/30/14 1033  BP: 143/77  Pulse: 69  Temp: 98.2 F (36.8 C)  Resp: 20   Filed Weights   01/30/14 1033  Weight: 197 lb 14.4 oz (89.767 kg)    GENERAL:alert, no distress and comfortable SKIN: skin color, texture, turgor are normal, no rashes or significant lesions EYES: normal, Conjunctiva are pink and non-injected, sclera clear OROPHARYNX:no exudate, no erythema and lips, buccal mucosa, and tongue normal  NECK: supple, thyroid normal size, non-tender, without nodularity LYMPH:  no palpable lymphadenopathy in the cervical, axillary or inguinal LUNGS: clear to auscultation and percussion with normal breathing effort HEART: regular rate & rhythm and no murmurs and no lower extremity edema ABDOMEN:abdomen soft, non-tender and normal bowel sounds Musculoskeletal:no cyanosis of digits and no clubbing  NEURO: alert & oriented x 3 with fluent speech, no focal motor/sensory deficits  LABORATORY DATA:  I have reviewed the data as listed  Component Value Date/Time   NA 140 01/19/2014 0947   NA 139 08/22/2013 0758   K 4.3 01/19/2014 0947   K 3.9 08/22/2013 0758   CL 104 08/22/2013 0758   CO2 27 01/19/2014 0947   CO2 22  08/22/2013 0758   GLUCOSE 217* 01/19/2014 0947   GLUCOSE 120* 08/22/2013 0758   BUN 11.7 01/19/2014 0947   BUN 19 08/22/2013 0758   CREATININE 1.0 01/19/2014 0947   CREATININE 0.9 08/22/2013 0758   CALCIUM 9.2 01/19/2014 0947   CALCIUM 8.8 08/22/2013 0758   PROT 6.6 01/19/2014 0947   PROT 6.6 08/22/2013 0758   ALBUMIN 3.4* 01/19/2014 0947   ALBUMIN 3.8 08/22/2013 0758   AST 21 01/19/2014 0947   AST 23 08/22/2013 0758   ALT 25 01/19/2014 0947   ALT 17 08/22/2013 0758   ALKPHOS 75 01/19/2014 0947   ALKPHOS 41 08/22/2013 0758   BILITOT 0.44 01/19/2014 0947   BILITOT 0.8 08/22/2013 0758   GFRNONAA 71.09 01/24/2010 0753   GFRAA 78 01/20/2008 0755    No results found for: SPEP, UPEP  Lab Results  Component Value Date   WBC 1.9* 01/30/2014   NEUTROABS 1.7 01/30/2014   HGB 10.0* 01/30/2014   HCT 30.2* 01/30/2014   MCV 87.4 01/30/2014   PLT 51* 01/30/2014      Chemistry      Component Value Date/Time   NA 140 01/19/2014 0947   NA 139 08/22/2013 0758   K 4.3 01/19/2014 0947   K 3.9 08/22/2013 0758   CL 104 08/22/2013 0758   CO2 27 01/19/2014 0947   CO2 22 08/22/2013 0758   BUN 11.7 01/19/2014 0947   BUN 19 08/22/2013 0758   CREATININE 1.0 01/19/2014 0947   CREATININE 0.9 08/22/2013 0758      Component Value Date/Time   CALCIUM 9.2 01/19/2014 0947   CALCIUM 8.8 08/22/2013 0758   ALKPHOS 75 01/19/2014 0947   ALKPHOS 41 08/22/2013 0758   AST 21 01/19/2014 0947   AST 23 08/22/2013 0758   ALT 25 01/19/2014 0947   ALT 17 08/22/2013 0758   BILITOT 0.44 01/19/2014 0947   BILITOT 0.8 08/22/2013 0758      ASSESSMENT & PLAN:  AML (acute myeloid leukemia) in remission He tolerated treatment well. He will return to [facility] on 03/05/14 for cycle 4 of consolidation chemotherapy. Continue supportive care    Anemia in neoplastic disease This is likely due to recent treatment. The patient denies recent history of bleeding such as epistaxis,  hematuria or hematochezia. He is asymptomatic from the anemia. I will observe for now.   Per protocol/instrucion from [facility] will transfuse 2 units of irradiated blood if hemoglobin is less than 8 g.   Thrombocytopenia due to drugs This is likely due to recent treatment. The patient denies recent history of bleeding such as epistaxis, hematuria or hematochezia. He is asymptomatic from the low platelet count. I will observe for now.   Per protocol/instrucion from [facility] will transfuse 1 unit of irradiated platelets if platelet count is less than 20,000 or bleeding.   Leukopenia due to antineoplastic chemotherapy This is likely due to recent treatment. The patient denies recent history of fevers, cough, chills, diarrhea or dysuria. He is asymptomatic from the leukopenia. He will receive Neulasta today. He will continue prophylactic antimicrobial therapy   Orders Placed This Encounter  Procedures  . Hold Tube, Blood Bank    Standing Status: Standing  Number of Occurrences: 22     Standing Expiration Date: 01/31/2015   All questions were answered. The patient knows to call the clinic with any problems, questions or concerns. No barriers to learning was detected. I spent 30 minutes counseling the patient face to face. The total time spent in the appointment was 40 minutes and more than 50% was on counseling and review of test results     [facility], [place], MD 01/30/2014 1:07 PM

## 2014-01-30 NOTE — Telephone Encounter (Signed)
Pt informs he is scheduled to be admitted at [facility] again on 03/05/13.

## 2014-01-30 NOTE — Telephone Encounter (Signed)
, °

## 2014-01-30 NOTE — Patient Instructions (Signed)
Pegfilgrastim injection What is this medicine? PEGFILGRASTIM (peg fil GRA stim) is a long-acting granulocyte colony-stimulating factor that stimulates the growth of neutrophils, a type of white blood cell important in the body's fight against infection. It is used to reduce the incidence of fever and infection in patients with certain types of cancer who are receiving chemotherapy that affects the bone marrow. This medicine may be used for other purposes; ask your health care provider or pharmacist if you have questions. COMMON BRAND NAME(S): Neulasta What should I tell my health care provider before I take this medicine? They need to know if you have any of these conditions: -latex allergy -ongoing radiation therapy -sickle cell disease -skin reactions to acrylic adhesives (On-Body Injector only) -an unusual or allergic reaction to pegfilgrastim, filgrastim, other medicines, foods, dyes, or preservatives -pregnant or trying to get pregnant -breast-feeding How should I use this medicine? This medicine is for injection under the skin. If you get this medicine at home, you will be taught how to prepare and give the pre-filled syringe or how to use the On-body Injector. Refer to the patient Instructions for Use for detailed instructions. Use exactly as directed. Take your medicine at regular intervals. Do not take your medicine more often than directed. It is important that you put your used needles and syringes in a special sharps container. Do not put them in a trash can. If you do not have a sharps container, call your pharmacist or healthcare provider to get one. Talk to your pediatrician regarding the use of this medicine in children. Special care may be needed. Overdosage: If you think you have taken too much of this medicine contact a poison control center or emergency room at once. NOTE: This medicine is only for you. Do not share this medicine with others. What if I miss a dose? It is  important not to miss your dose. Call your doctor or health care professional if you miss your dose. If you miss a dose due to an On-body Injector failure or leakage, a new dose should be administered as soon as possible using a single prefilled syringe for manual use. What may interact with this medicine? Interactions have not been studied. Give your health care provider a list of all the medicines, herbs, non-prescription drugs, or dietary supplements you use. Also tell them if you smoke, drink alcohol, or use illegal drugs. Some items may interact with your medicine. This list may not describe all possible interactions. Give your health care provider a list of all the medicines, herbs, non-prescription drugs, or dietary supplements you use. Also tell them if you smoke, drink alcohol, or use illegal drugs. Some items may interact with your medicine. What should I watch for while using this medicine? You may need blood work done while you are taking this medicine. If you are going to need a MRI, CT scan, or other procedure, tell your doctor that you are using this medicine (On-Body Injector only). What side effects may I notice from receiving this medicine? Side effects that you should report to your doctor or health care professional as soon as possible: -allergic reactions like skin rash, itching or hives, swelling of the face, lips, or tongue -dizziness -fever -pain, redness, or irritation at site where injected -pinpoint red spots on the skin -shortness of breath or breathing problems -stomach or side pain, or pain at the shoulder -swelling -tiredness -trouble passing urine Side effects that usually do not require medical attention (report to your doctor   or health care professional if they continue or are bothersome): -bone pain -muscle pain This list may not describe all possible side effects. Call your doctor for medical advice about side effects. You may report side effects to FDA at  1-800-FDA-1088. Where should I keep my medicine? Keep out of the reach of children. Store pre-filled syringes in a refrigerator between 2 and 8 degrees C (36 and 46 degrees F). Do not freeze. Keep in carton to protect from light. Throw away this medicine if it is left out of the refrigerator for more than 48 hours. Throw away any unused medicine after the expiration date. NOTE: This sheet is a summary. It may not cover all possible information. If you have questions about this medicine, talk to your doctor, pharmacist, or health care provider.  2015, Elsevier/Gold Standard. (2013-05-01 16:14:05)  

## 2014-01-30 NOTE — Assessment & Plan Note (Signed)
This is likely due to recent treatment. The patient denies recent history of fevers, cough, chills, diarrhea or dysuria. He is asymptomatic from the leukopenia. He will receive Neulasta today. He will continue prophylactic antimicrobial therapy

## 2014-01-30 NOTE — Telephone Encounter (Signed)
Per staff message and POF I have scheduled appts. Advised scheduler of appts and to adjust labs, move appts to sickle cell center. JMW

## 2014-02-02 ENCOUNTER — Ambulatory Visit (HOSPITAL_BASED_OUTPATIENT_CLINIC_OR_DEPARTMENT_OTHER): Payer: Managed Care, Other (non HMO) | Admitting: Lab

## 2014-02-02 ENCOUNTER — Ambulatory Visit (HOSPITAL_BASED_OUTPATIENT_CLINIC_OR_DEPARTMENT_OTHER): Payer: Managed Care, Other (non HMO)

## 2014-02-02 ENCOUNTER — Telehealth: Payer: Self-pay | Admitting: *Deleted

## 2014-02-02 ENCOUNTER — Ambulatory Visit (HOSPITAL_COMMUNITY)
Admission: RE | Admit: 2014-02-02 | Discharge: 2014-02-02 | Disposition: A | Discharge status: Home / Self Care | Payer: Managed Care, Other (non HMO) | Source: Ambulatory Visit | Attending: Hematology and Oncology | Admitting: Hematology and Oncology

## 2014-02-02 ENCOUNTER — Other Ambulatory Visit: Payer: Self-pay | Admitting: *Deleted

## 2014-02-02 VITALS — BP 125/69 | HR 78 | Temp 98.2°F | Resp 20

## 2014-02-02 DIAGNOSIS — C9201 Acute myeloblastic leukemia, in remission: Secondary | ICD-10-CM

## 2014-02-02 DIAGNOSIS — C9200 Acute myeloblastic leukemia, not having achieved remission: Secondary | ICD-10-CM | POA: Insufficient documentation

## 2014-02-02 DIAGNOSIS — D630 Anemia in neoplastic disease: Secondary | ICD-10-CM

## 2014-02-02 LAB — COMPREHENSIVE METABOLIC PANEL (CC13)
ALBUMIN: 3.5 g/dL (ref 3.5–5.0)
ALK PHOS: 82 U/L (ref 40–150)
ALT: 19 U/L (ref 0–55)
AST: 13 U/L (ref 5–34)
Anion Gap: 11 mEq/L (ref 3–11)
BILIRUBIN TOTAL: 1 mg/dL (ref 0.20–1.20)
BUN: 17.5 mg/dL (ref 7.0–26.0)
CO2: 25 mEq/L (ref 22–29)
Calcium: 9.2 mg/dL (ref 8.4–10.4)
Chloride: 104 mEq/L (ref 98–109)
Creatinine: 1 mg/dL (ref 0.7–1.3)
EGFR: 79 mL/min/{1.73_m2} — ABNORMAL LOW (ref >90)
Glucose: 276 mg/dl — ABNORMAL HIGH (ref 70–140)
POTASSIUM: 4.1 meq/L (ref 3.5–5.1)
Sodium: 139 mEq/L (ref 136–145)
TOTAL PROTEIN: 7 g/dL (ref 6.4–8.3)

## 2014-02-02 LAB — CBC WITH DIFFERENTIAL/PLATELET
BASO%: 0 % (ref 0.0–2.0)
BASOS ABS: 0 10*3/uL (ref 0.0–0.1)
EOS%: 0 % (ref 0.0–7.0)
Eosinophils Absolute: 0 10*3/uL (ref 0.0–0.5)
HCT: 26.9 % — ABNORMAL LOW (ref 38.4–49.9)
HEMOGLOBIN: 9.1 g/dL — AB (ref 13.0–17.1)
LYMPH%: 27.6 % (ref 14.0–49.0)
MCH: 29.2 pg (ref 27.2–33.4)
MCHC: 33.8 g/dL (ref 32.0–36.0)
MCV: 86.2 fL (ref 79.3–98.0)
MONO#: 0 10*3/uL — ABNORMAL LOW (ref 0.1–0.9)
MONO%: 1.3 % (ref 0.0–14.0)
NEUT%: 71.1 % (ref 39.0–75.0)
NEUTROS ABS: 0.5 10*3/uL — AB (ref 1.5–6.5)
NRBC: 0 % (ref 0–0)
Platelets: 6 10*3/uL — CL (ref 140–400)
RBC: 3.12 10*6/uL — AB (ref 4.20–5.82)
RDW: 16.2 % — ABNORMAL HIGH (ref 11.0–14.6)
WBC: 0.8 10*3/uL — CL (ref 4.0–10.3)
lymph#: 0.2 10*3/uL — ABNORMAL LOW (ref 0.9–3.3)

## 2014-02-02 LAB — HOLD TUBE, BLOOD BANK

## 2014-02-02 LAB — MAGNESIUM (CC13): MAGNESIUM: 2.3 mg/dL (ref 1.5–2.5)

## 2014-02-02 MED ORDER — SODIUM CHLORIDE 0.9 % IJ SOLN
10.0000 mL | INTRAMUSCULAR | Status: AC | PRN
  Administered 2014-02-02: 10 mL
  Filled 2014-02-02: qty 10

## 2014-02-02 MED ORDER — HEPARIN SOD (PORK) LOCK FLUSH 100 UNIT/ML IV SOLN
250.0000 [IU] | INTRAVENOUS | Status: DC | PRN
  Filled 2014-02-02: qty 5

## 2014-02-02 MED ORDER — HEPARIN SOD (PORK) LOCK FLUSH 100 UNIT/ML IV SOLN
500.0000 [IU] | Freq: Every day | INTRAVENOUS | Status: AC | PRN
  Administered 2014-02-02: 500 [IU]
  Filled 2014-02-02: qty 5

## 2014-02-02 MED ORDER — ACETAMINOPHEN 325 MG PO TABS: 650.0000 mg | ORAL_TABLET | Freq: Once | ORAL | Status: DC

## 2014-02-02 MED ORDER — SODIUM CHLORIDE 0.9 % IV SOLN
250.0000 mL | Freq: Once | INTRAVENOUS | Status: AC
  Administered 2014-02-02: 250 mL via INTRAVENOUS

## 2014-02-02 MED ORDER — DIPHENHYDRAMINE HCL 25 MG PO CAPS: 25.0000 mg | ORAL_CAPSULE | Freq: Once | ORAL | Status: DC

## 2014-02-02 NOTE — Patient Instructions (Signed)
Platelet Transfusion Information °This is information about transfusions of platelets. Platelets are tiny cells made by the bone marrow and found in the blood. When a blood vessel is damaged, platelets rush to the damaged area to help form a clot. This begins the healing process. When platelets get very low, your blood may have trouble clotting. This may be from: °· Illness. °· Blood disorder. °· Chemotherapy to treat cancer. °Often, lower platelet counts do not cause problems.  °Platelets usually last for 7 to 10 days. If they are not used in an injury, they are broken down by the liver or spleen. °Symptoms of low platelet count include: °· Nosebleeds. °· Bleeding gums. °· Heavy periods. °· Bruising and tiny blood spots in the skin. °¨ Pinpoint spots of bleeding (petechiae). °¨ Larger bruises (purpura). °· Bleeding can be more serious if it happens in the brain or bowel. °Platelet transfusions are often used to keep the platelet count at an acceptable level. Serious bleeding due to low platelets is uncommon. °RISKS AND COMPLICATIONS °Severe side effects from platelet transfusions are uncommon. Minor reactions may include: °· Itching. °· Rashes. °· High temperature and shivering. °Medications are available to stop transfusion reactions. Let your health care provider know if you develop any of the above problems.  °If you are having platelet transfusions frequently, they may get less effective. This is called becoming refractory to platelets. It is uncommon. This can happen from non-immune causes and immune causes. Non-immune causes include: °· High temperatures. °· Some medications. °· An enlarged spleen. °Immune causes happen when your body discovers the platelets are not your own and begins making antibodies against them. The antibodies kill the platelets quickly. Even with platelet transfusions, you may still notice problems with bleeding or bruising. Let your health care providers know about this. Other things  can be done to help if this happens.  °BEFORE THE PROCEDURE  °· Your health care provider will check your platelet count regularly. °· If the platelet count is too low, it may be necessary to have a platelet transfusion. °· This is more important before certain procedures with a risk of bleeding, such as a spinal tap. °· Platelet transfusion reduces the risk of bleeding during or after the procedure. °· Except in emergencies, giving a transfusion requires a written consent. °Before blood is taken from a donor, a complete history is taken to make sure the person has no history of previous diseases, nor engages in risky social behavior. Examples of this are intravenous drug use or sexual activity with multiple partners. This could lead to infected blood or blood products being used. This history is taken in spite of the extensive testing to make sure the blood is safe. All blood products transfused are tested to make sure it is a match for the person getting the blood. It is also checked for infections. Blood is the safest it has ever been. The risk of getting an infection is very low. °PROCEDURE °· The platelets are stored in small plastic bags that are kept at a low temperature. °· Each bag is called a unit and sometimes two units are given. They are given through an intravenous line by drip infusion over about one-half hour. °· Usually blood is collected from multiple people to get enough to transfuse. °· Sometimes, the platelets are collected from a single person. This is done using a special machine that separates the platelets from the blood. The machine is called an apheresis machine. Platelets collected in this   way are called apheresed platelets. Apheresed platelets reduce the risk of becoming sensitive to the platelets. This lowers the chances of having a transfusion reaction. °· As it only takes a short time to give the platelets, this treatment can be given in an outpatient department. Platelets can also be  given before or after other treatments. °SEEK IMMEDIATE MEDICAL CARE IF: °You have any of the following symptoms over the next 12 hours or several days: °· Shaking chills. °· Fever with a temperature greater than 102°F (38.9°C) develops. °· Back pain or muscle pain. °· People around you feel you are not acting correctly, or you are confused. °· Blood in the urine or bowel movements, or bleeding from any place in your body. °· Shortness of breath, or difficulty breathing. °· Dizziness. °· Fainting. °· You break out in a rash or develop hives. °· Decrease in the amount of urine you are putting out, or the urine turns a dark color or changes to pink, red, or brown. °· A severe headache or stiff neck. °· Bruising more easily. °Document Released: 11/27/2006 Document Revised: 06/16/2013 Document Reviewed: 11/27/2006 °ExitCare® Patient Information ©2015 [employer]. This information is not intended to replace advice given to you by your health care provider. Make sure you discuss any questions you have with your health care provider. ° °

## 2014-02-02 NOTE — Telephone Encounter (Signed)
Pt will receive one unit of Platelets today.   Labs faxed to [facility],  Dr. Nadara Mustard, at fax [phone].

## 2014-02-02 NOTE — Telephone Encounter (Signed)
Pt

## 2014-02-04 ENCOUNTER — Ambulatory Visit: Payer: Managed Care, Other (non HMO) | Admitting: Lab

## 2014-02-04 ENCOUNTER — Telehealth: Payer: Self-pay | Admitting: Medical Oncology

## 2014-02-04 ENCOUNTER — Telehealth (HOSPITAL_BASED_OUTPATIENT_CLINIC_OR_DEPARTMENT_OTHER): Payer: Managed Care, Other (non HMO) | Admitting: Medical Oncology

## 2014-02-04 ENCOUNTER — Other Ambulatory Visit: Payer: Self-pay | Admitting: Hematology and Oncology

## 2014-02-04 ENCOUNTER — Ambulatory Visit (HOSPITAL_BASED_OUTPATIENT_CLINIC_OR_DEPARTMENT_OTHER): Payer: Managed Care, Other (non HMO)

## 2014-02-04 ENCOUNTER — Other Ambulatory Visit: Payer: Self-pay | Admitting: Medical Oncology

## 2014-02-04 VITALS — BP 139/69 | HR 77 | Temp 98.3°F | Resp 18

## 2014-02-04 DIAGNOSIS — C9200 Acute myeloblastic leukemia, not having achieved remission: Secondary | ICD-10-CM

## 2014-02-04 DIAGNOSIS — C9201 Acute myeloblastic leukemia, in remission: Secondary | ICD-10-CM

## 2014-02-04 DIAGNOSIS — D630 Anemia in neoplastic disease: Secondary | ICD-10-CM

## 2014-02-04 LAB — CBC WITH DIFFERENTIAL/PLATELET
HCT: 23 % — ABNORMAL LOW (ref 38.4–49.9)
HEMOGLOBIN: 7.9 g/dL — AB (ref 13.0–17.1)
MCH: 29.5 pg (ref 27.2–33.4)
MCHC: 34.3 g/dL (ref 32.0–36.0)
MCV: 85.8 fL (ref 79.3–98.0)
Platelets: 19 10*3/uL — ABNORMAL LOW (ref 140–400)
RBC: 2.68 10*6/uL — AB (ref 4.20–5.82)
RDW: 15.9 % — AB (ref 11.0–14.6)

## 2014-02-04 LAB — PREPARE PLATELET PHERESIS: Unit division: 0

## 2014-02-04 LAB — TECHNOLOGIST REVIEW

## 2014-02-04 LAB — PREPARE RBC (CROSSMATCH)

## 2014-02-04 LAB — HOLD TUBE, BLOOD BANK

## 2014-02-04 MED ORDER — SODIUM CHLORIDE 0.9 % IV SOLN
250.0000 mL | Freq: Once | INTRAVENOUS | Status: AC
  Administered 2014-02-04: 250 mL via INTRAVENOUS

## 2014-02-04 MED ORDER — DIPHENHYDRAMINE HCL 25 MG PO CAPS
ORAL_CAPSULE | ORAL | Status: AC
  Filled 2014-02-04: qty 1

## 2014-02-04 MED ORDER — HEPARIN SOD (PORK) LOCK FLUSH 100 UNIT/ML IV SOLN
500.0000 [IU] | Freq: Every day | INTRAVENOUS | Status: DC | PRN
  Filled 2014-02-04: qty 5

## 2014-02-04 MED ORDER — SODIUM CHLORIDE 0.9 % IJ SOLN
10.0000 mL | INTRAMUSCULAR | Status: DC | PRN
  Filled 2014-02-04: qty 10

## 2014-02-04 MED ORDER — ACETAMINOPHEN 325 MG PO TABS
650.0000 mg | ORAL_TABLET | Freq: Once | ORAL | Status: AC
  Administered 2014-02-04: 650 mg via ORAL

## 2014-02-04 MED ORDER — SODIUM CHLORIDE 0.9 % IJ SOLN
10.0000 mL | INTRAMUSCULAR | Status: AC | PRN
  Administered 2014-02-04: 10 mL
  Filled 2014-02-04: qty 10

## 2014-02-04 MED ORDER — HEPARIN SOD (PORK) LOCK FLUSH 100 UNIT/ML IV SOLN
500.0000 [IU] | Freq: Every day | INTRAVENOUS | Status: AC | PRN
  Administered 2014-02-04: 500 [IU]
  Filled 2014-02-04: qty 5

## 2014-02-04 MED ORDER — ACETAMINOPHEN 325 MG PO TABS
ORAL_TABLET | ORAL | Status: AC
  Filled 2014-02-04: qty 1

## 2014-02-04 MED ORDER — DIPHENHYDRAMINE HCL 25 MG PO CAPS
25.0000 mg | ORAL_CAPSULE | Freq: Once | ORAL | Status: AC
  Administered 2014-02-04: 25 mg via ORAL

## 2014-02-04 NOTE — Telephone Encounter (Signed)
Patient LVMOM requesting to have blood transfusion today, reports that he doesn't think he can wait till tomorrow's sched appt. Pt sched to have labs with poss transfusion tomorrow.   Return call to patient to inquire of his symptoms, patient reporting SOB and tightness in chest (he states this is normal for him), but reporting now that he is having light headedness upon standing. Recommended to patient to go to ED, pt reports does not feel that this is an emergency, just feels that he needs to have transfusion today.   Patient to come in today for labs, type and hold and possible transfusion (orders per [facility] request on file).

## 2014-02-04 NOTE — Patient Instructions (Signed)
Platelet Transfusion Information This is information about transfusions of platelets. Platelets are tiny cells made by the bone marrow and found in the blood. When a blood vessel is damaged, platelets rush to the damaged area to help form a clot. This begins the healing process. When platelets get very low, your blood may have trouble clotting. This may be from:  Illness.  Blood disorder.  Chemotherapy to treat cancer. Often, lower platelet counts do not cause problems.  Platelets usually last for 7 to 10 days. If they are not used in an injury, they are broken down by the liver or spleen. Symptoms of low platelet count include:  Nosebleeds.  Bleeding gums.  Heavy periods.  Bruising and tiny blood spots in the skin.  Pinpoint spots of bleeding (petechiae).  Larger bruises (purpura).  Bleeding can be more serious if it happens in the brain or bowel. Platelet transfusions are often used to keep the platelet count at an acceptable level. Serious bleeding due to low platelets is uncommon. RISKS AND COMPLICATIONS Severe side effects from platelet transfusions are uncommon. Minor reactions may include:  Itching.  Rashes.  High temperature and shivering. Medications are available to stop transfusion reactions. Let your health care provider know if you develop any of the above problems.  If you are having platelet transfusions frequently, they may get less effective. This is called becoming refractory to platelets. It is uncommon. This can happen from non-immune causes and immune causes. Non-immune causes include:  High temperatures.  Some medications.  An enlarged spleen. Immune causes happen when your body discovers the platelets are not your own and begins making antibodies against them. The antibodies kill the platelets quickly. Even with platelet transfusions, you may still notice problems with bleeding or bruising. Let your health care providers know about this. Other things  can be done to help if this happens.  BEFORE THE PROCEDURE   Your health care provider will check your platelet count regularly.  If the platelet count is too low, it may be necessary to have a platelet transfusion.  This is more important before certain procedures with a risk of bleeding, such as a spinal tap.  Platelet transfusion reduces the risk of bleeding during or after the procedure.  Except in emergencies, giving a transfusion requires a written consent. Before blood is taken from a donor, a complete history is taken to make sure the person has no history of previous diseases, nor engages in risky social behavior. Examples of this are intravenous drug use or sexual activity with multiple partners. This could lead to infected blood or blood products being used. This history is taken in spite of the extensive testing to make sure the blood is safe. All blood products transfused are tested to make sure it is a match for the person getting the blood. It is also checked for infections. Blood is the safest it has ever been. The risk of getting an infection is very low. PROCEDURE  The platelets are stored in small plastic bags that are kept at a low temperature.  Each bag is called a unit and sometimes two units are given. They are given through an intravenous line by drip infusion over about one-half hour.  Usually blood is collected from multiple people to get enough to transfuse.  Sometimes, the platelets are collected from a single person. This is done using a special machine that separates the platelets from the blood. The machine is called an apheresis machine. Platelets collected in this   way are called apheresed platelets. Apheresed platelets reduce the risk of becoming sensitive to the platelets. This lowers the chances of having a transfusion reaction.  As it only takes a short time to give the platelets, this treatment can be given in an outpatient department. Platelets can also be  given before or after other treatments. SEEK IMMEDIATE MEDICAL CARE IF: You have any of the following symptoms over the next 12 hours or several days:  Shaking chills.  Fever with a temperature greater than 102F (38.9C) develops.  Back pain or muscle pain.  People around you feel you are not acting correctly, or you are confused.  Blood in the urine or bowel movements, or bleeding from any place in your body.  Shortness of breath, or difficulty breathing.  Dizziness.  Fainting.  You break out in a rash or develop hives.  Decrease in the amount of urine you are putting out, or the urine turns a dark color or changes to pink, red, or brown.  A severe headache or stiff neck.  Bruising more easily. Document Released: 11/27/2006 Document Revised: 06/16/2013 Document Reviewed: 11/27/2006 ExitCare Patient Information 2015 [employer]. This information is not intended to replace advice given to you by your health care provider. Make sure you discuss any questions you have with your health care provider.   Blood Transfusion Information WHAT IS A BLOOD TRANSFUSION? A transfusion is the replacement of blood or some of its parts. Blood is made up of multiple cells which provide different functions.  Red blood cells carry oxygen and are used for blood loss replacement.  White blood cells fight against infection.  Platelets control bleeding.  Plasma helps clot blood.  Other blood products are available for specialized needs, such as hemophilia or other clotting disorders. BEFORE THE TRANSFUSION  Who gives blood for transfusions?   You may be able to donate blood to be used at a later date on yourself (autologous donation).  Relatives can be asked to donate blood. This is generally not any safer than if you have received blood from a stranger. The same precautions are taken to ensure safety when a relative's blood is donated.  Healthy volunteers who are fully evaluated to  make sure their blood is safe. This is blood bank blood. Transfusion therapy is the safest it has ever been in the practice of medicine. Before blood is taken from a donor, a complete history is taken to make sure that person has no history of diseases nor engages in risky social behavior (examples are intravenous drug use or sexual activity with multiple partners). The donor's travel history is screened to minimize risk of transmitting infections, such as malaria. The donated blood is tested for signs of infectious diseases, such as HIV and hepatitis. The blood is then tested to be sure it is compatible with you in order to minimize the chance of a transfusion reaction. If you or a relative donates blood, this is often done in anticipation of surgery and is not appropriate for emergency situations. It takes many days to process the donated blood. RISKS AND COMPLICATIONS Although transfusion therapy is very safe and saves many lives, the main dangers of transfusion include:   Getting an infectious disease.  Developing a transfusion reaction. This is an allergic reaction to something in the blood you were given. Every precaution is taken to prevent this. The decision to have a blood transfusion has been considered carefully by your caregiver before blood is given. Blood is not given   unless the benefits outweigh the risks. AFTER THE TRANSFUSION  Right after receiving a blood transfusion, you will usually feel much better and more energetic. This is especially true if your red blood cells have gotten low (anemic). The transfusion raises the level of the red blood cells which carry oxygen, and this usually causes an energy increase.  The nurse administering the transfusion will monitor you carefully for complications. HOME CARE INSTRUCTIONS  No special instructions are needed after a transfusion. You may find your energy is better. Speak with your caregiver about any limitations on activity for underlying  diseases you may have. SEEK MEDICAL CARE IF:   Your condition is not improving after your transfusion.  You develop redness or irritation at the intravenous (IV) site. SEEK IMMEDIATE MEDICAL CARE IF:  Any of the following symptoms occur over the next 12 hours:  Shaking chills.  You have a temperature by mouth above 102 F (38.9 C), not controlled by medicine.  Chest, back, or muscle pain.  People around you feel you are not acting correctly or are confused.  Shortness of breath or difficulty breathing.  Dizziness and fainting.  You get a rash or develop hives.  You have a decrease in urine output.  Your urine turns a dark color or changes to pink, red, or brown. Any of the following symptoms occur over the next 10 days:  You have a temperature by mouth above 102 F (38.9 C), not controlled by medicine.  Shortness of breath.  Weakness after normal activity.  The white part of the eye turns yellow (jaundice).  You have a decrease in the amount of urine or are urinating less often.  Your urine turns a dark color or changes to pink, red, or brown. Document Released: 01/28/2000 Document Revised: 04/24/2011 Document Reviewed: 09/16/2007 ExitCare Patient Information 2015 [employer]. This information is not intended to replace advice given to you by your health care provider. Make sure you discuss any questions you have with your health care provider.  

## 2014-02-04 NOTE — Telephone Encounter (Signed)
Faxed lab results to [facility] Attn: Dr. Berton Bon, with note informing them that pt is to receive 2 units PRBC's today and 1 unit of Platelets.

## 2014-02-05 ENCOUNTER — Other Ambulatory Visit: Payer: Managed Care, Other (non HMO)

## 2014-02-05 LAB — TYPE AND SCREEN
ABO/RH(D): A POS
Antibody Screen: NEGATIVE
UNIT DIVISION: 0
UNIT DIVISION: 0

## 2014-02-05 LAB — PREPARE PLATELET PHERESIS: Unit division: 0

## 2014-02-09 ENCOUNTER — Other Ambulatory Visit: Payer: Self-pay | Admitting: *Deleted

## 2014-02-09 ENCOUNTER — Other Ambulatory Visit (HOSPITAL_BASED_OUTPATIENT_CLINIC_OR_DEPARTMENT_OTHER): Payer: Managed Care, Other (non HMO) | Admitting: Hematology and Oncology

## 2014-02-09 ENCOUNTER — Ambulatory Visit (HOSPITAL_BASED_OUTPATIENT_CLINIC_OR_DEPARTMENT_OTHER): Payer: Managed Care, Other (non HMO)

## 2014-02-09 ENCOUNTER — Other Ambulatory Visit (HOSPITAL_BASED_OUTPATIENT_CLINIC_OR_DEPARTMENT_OTHER): Payer: Managed Care, Other (non HMO)

## 2014-02-09 ENCOUNTER — Telehealth: Payer: Self-pay | Admitting: *Deleted

## 2014-02-09 ENCOUNTER — Encounter: Payer: Self-pay | Admitting: *Deleted

## 2014-02-09 VITALS — BP 120/67 | HR 85 | Temp 98.2°F | Resp 18

## 2014-02-09 DIAGNOSIS — D469 Myelodysplastic syndrome, unspecified: Secondary | ICD-10-CM

## 2014-02-09 DIAGNOSIS — C9201 Acute myeloblastic leukemia, in remission: Secondary | ICD-10-CM

## 2014-02-09 DIAGNOSIS — C9200 Acute myeloblastic leukemia, not having achieved remission: Secondary | ICD-10-CM | POA: Diagnosis not present

## 2014-02-09 DIAGNOSIS — D630 Anemia in neoplastic disease: Secondary | ICD-10-CM

## 2014-02-09 LAB — CBC WITH DIFFERENTIAL/PLATELET
BASO%: 0 % (ref 0.0–2.0)
Basophils Absolute: 0 10*3/uL (ref 0.0–0.1)
EOS%: 0 % (ref 0.0–7.0)
Eosinophils Absolute: 0 10*3/uL (ref 0.0–0.5)
HCT: 26.4 % — ABNORMAL LOW (ref 38.4–49.9)
HGB: 9.1 g/dL — ABNORMAL LOW (ref 13.0–17.1)
LYMPH%: 28.3 % (ref 14.0–49.0)
MCH: 29.6 pg (ref 27.2–33.4)
MCHC: 34.5 g/dL (ref 32.0–36.0)
MCV: 86 fL (ref 79.3–98.0)
MONO#: 0.1 10*3/uL (ref 0.1–0.9)
MONO%: 10.9 % (ref 0.0–14.0)
NEUT%: 60.8 % (ref 39.0–75.0)
NEUTROS ABS: 0.3 10*3/uL — AB (ref 1.5–6.5)
Platelets: 4 10*3/uL — CL (ref 140–400)
RBC: 3.07 10*6/uL — AB (ref 4.20–5.82)
RDW: 16.5 % — ABNORMAL HIGH (ref 11.0–14.6)
WBC: 0.5 10*3/uL — CL (ref 4.0–10.3)
lymph#: 0.1 10*3/uL — ABNORMAL LOW (ref 0.9–3.3)
nRBC: 0 % (ref 0–0)

## 2014-02-09 LAB — COMPREHENSIVE METABOLIC PANEL (CC13)
ALT: 15 U/L (ref 0–55)
ANION GAP: 11 meq/L (ref 3–11)
AST: 12 U/L (ref 5–34)
Albumin: 3.4 g/dL — ABNORMAL LOW (ref 3.5–5.0)
Alkaline Phosphatase: 103 U/L (ref 40–150)
BUN: 24.7 mg/dL (ref 7.0–26.0)
CO2: 24 mEq/L (ref 22–29)
Calcium: 9.5 mg/dL (ref 8.4–10.4)
Chloride: 104 mEq/L (ref 98–109)
Creatinine: 1.2 mg/dL (ref 0.7–1.3)
EGFR: 62 mL/min/{1.73_m2} — ABNORMAL LOW (ref >90)
GLUCOSE: 311 mg/dL — AB (ref 70–140)
Potassium: 4.1 mEq/L (ref 3.5–5.1)
Sodium: 139 mEq/L (ref 136–145)
TOTAL PROTEIN: 7.1 g/dL (ref 6.4–8.3)
Total Bilirubin: 0.61 mg/dL (ref 0.20–1.20)

## 2014-02-09 LAB — HOLD TUBE, BLOOD BANK

## 2014-02-09 LAB — MAGNESIUM (CC13): Magnesium: 1.9 mg/dl (ref 1.5–2.5)

## 2014-02-09 MED ORDER — HEPARIN SOD (PORK) LOCK FLUSH 100 UNIT/ML IV SOLN
500.0000 [IU] | Freq: Every day | INTRAVENOUS | Status: AC | PRN
  Administered 2014-02-09: 500 [IU]
  Filled 2014-02-09: qty 5

## 2014-02-09 MED ORDER — SODIUM CHLORIDE 0.9 % IJ SOLN
10.0000 mL | INTRAMUSCULAR | Status: AC | PRN
  Administered 2014-02-09: 10 mL
  Filled 2014-02-09: qty 10

## 2014-02-09 MED ORDER — SODIUM CHLORIDE 0.9 % IV SOLN
250.0000 mL | Freq: Once | INTRAVENOUS | Status: AC
  Administered 2014-02-09: 250 mL via INTRAVENOUS

## 2014-02-09 NOTE — Progress Notes (Signed)
Pt concerned about his cough/congestion since 02/05/14, states he spoke to triage RN but has not heard anything else about what he should do.  This RN spoke with Tammi, RN, Dr. Calton Dach nurse. States for patient to continue neutropenic precautions and to call if develops a fever or worsening symptoms. States she will follow up with patient once she is able to talk with Dr. Alvy Bimler about him.  Pt verbalized understanding.

## 2014-02-09 NOTE — Patient Instructions (Signed)
Platelet Transfusion Information °This is information about transfusions of platelets. Platelets are tiny cells made by the bone marrow and found in the blood. When a blood vessel is damaged, platelets rush to the damaged area to help form a clot. This begins the healing process. When platelets get very low, your blood may have trouble clotting. This may be from: °· Illness. °· Blood disorder. °· Chemotherapy to treat cancer. °Often, lower platelet counts do not cause problems.  °Platelets usually last for 7 to 10 days. If they are not used in an injury, they are broken down by the liver or spleen. °Symptoms of low platelet count include: °· Nosebleeds. °· Bleeding gums. °· Heavy periods. °· Bruising and tiny blood spots in the skin. °¨ Pinpoint spots of bleeding (petechiae). °¨ Larger bruises (purpura). °· Bleeding can be more serious if it happens in the brain or bowel. °Platelet transfusions are often used to keep the platelet count at an acceptable level. Serious bleeding due to low platelets is uncommon. °RISKS AND COMPLICATIONS °Severe side effects from platelet transfusions are uncommon. Minor reactions may include: °· Itching. °· Rashes. °· High temperature and shivering. °Medications are available to stop transfusion reactions. Let your health care provider know if you develop any of the above problems.  °If you are having platelet transfusions frequently, they may get less effective. This is called becoming refractory to platelets. It is uncommon. This can happen from non-immune causes and immune causes. Non-immune causes include: °· High temperatures. °· Some medications. °· An enlarged spleen. °Immune causes happen when your body discovers the platelets are not your own and begins making antibodies against them. The antibodies kill the platelets quickly. Even with platelet transfusions, you may still notice problems with bleeding or bruising. Let your health care providers know about this. Other things  can be done to help if this happens.  °BEFORE THE PROCEDURE  °· Your health care provider will check your platelet count regularly. °· If the platelet count is too low, it may be necessary to have a platelet transfusion. °· This is more important before certain procedures with a risk of bleeding, such as a spinal tap. °· Platelet transfusion reduces the risk of bleeding during or after the procedure. °· Except in emergencies, giving a transfusion requires a written consent. °Before blood is taken from a donor, a complete history is taken to make sure the person has no history of previous diseases, nor engages in risky social behavior. Examples of this are intravenous drug use or sexual activity with multiple partners. This could lead to infected blood or blood products being used. This history is taken in spite of the extensive testing to make sure the blood is safe. All blood products transfused are tested to make sure it is a match for the person getting the blood. It is also checked for infections. Blood is the safest it has ever been. The risk of getting an infection is very low. °PROCEDURE °· The platelets are stored in small plastic bags that are kept at a low temperature. °· Each bag is called a unit and sometimes two units are given. They are given through an intravenous line by drip infusion over about one-half hour. °· Usually blood is collected from multiple people to get enough to transfuse. °· Sometimes, the platelets are collected from a single person. This is done using a special machine that separates the platelets from the blood. The machine is called an apheresis machine. Platelets collected in this   way are called apheresed platelets. Apheresed platelets reduce the risk of becoming sensitive to the platelets. This lowers the chances of having a transfusion reaction. °· As it only takes a short time to give the platelets, this treatment can be given in an outpatient department. Platelets can also be  given before or after other treatments. °SEEK IMMEDIATE MEDICAL CARE IF: °You have any of the following symptoms over the next 12 hours or several days: °· Shaking chills. °· Fever with a temperature greater than 102°F (38.9°C) develops. °· Back pain or muscle pain. °· People around you feel you are not acting correctly, or you are confused. °· Blood in the urine or bowel movements, or bleeding from any place in your body. °· Shortness of breath, or difficulty breathing. °· Dizziness. °· Fainting. °· You break out in a rash or develop hives. °· Decrease in the amount of urine you are putting out, or the urine turns a dark color or changes to pink, red, or brown. °· A severe headache or stiff neck. °· Bruising more easily. °Document Released: 11/27/2006 Document Revised: 06/16/2013 Document Reviewed: 11/27/2006 °ExitCare® Patient Information ©2015 [employer]. This information is not intended to replace advice given to you by your health care provider. Make sure you discuss any questions you have with your health care provider. ° °

## 2014-02-09 NOTE — Telephone Encounter (Signed)
PT.'S SORE THROAT IS FINE NOW. HE HAS NASAL AND CHEST CONGESTION WITH AN OCCASIONAL COUGH. THE DRAINAGE AND SPUTUM IS CLEAR. NO FEVER. HE HAS USED AN OVER THE COUNTER DECONGESTANT AND A COUGH SUPPRESSANT. PT. ALSO HAS A LARGE SORE UNDER HIS RIGHT ARM. HE IS TAKING HIS ANTIBIOTIC. NO CHANGE TO THAT AREA.

## 2014-02-10 ENCOUNTER — Other Ambulatory Visit: Payer: Self-pay | Admitting: *Deleted

## 2014-02-10 LAB — PREPARE PLATELET PHERESIS: Unit division: 0

## 2014-02-12 ENCOUNTER — Other Ambulatory Visit: Payer: Self-pay | Admitting: *Deleted

## 2014-02-12 ENCOUNTER — Other Ambulatory Visit: Payer: Self-pay | Admitting: Hematology and Oncology

## 2014-02-12 ENCOUNTER — Ambulatory Visit (HOSPITAL_BASED_OUTPATIENT_CLINIC_OR_DEPARTMENT_OTHER): Payer: Managed Care, Other (non HMO) | Admitting: Hematology and Oncology

## 2014-02-12 ENCOUNTER — Encounter: Payer: Self-pay | Admitting: Hematology and Oncology

## 2014-02-12 ENCOUNTER — Ambulatory Visit (HOSPITAL_BASED_OUTPATIENT_CLINIC_OR_DEPARTMENT_OTHER): Payer: Managed Care, Other (non HMO)

## 2014-02-12 ENCOUNTER — Other Ambulatory Visit (HOSPITAL_BASED_OUTPATIENT_CLINIC_OR_DEPARTMENT_OTHER): Payer: Managed Care, Other (non HMO)

## 2014-02-12 VITALS — BP 118/74 | HR 78 | Temp 98.7°F | Resp 18

## 2014-02-12 VITALS — BP 127/66 | HR 95 | Temp 98.5°F | Resp 18 | Ht 68.75 in

## 2014-02-12 DIAGNOSIS — C9200 Acute myeloblastic leukemia, not having achieved remission: Secondary | ICD-10-CM | POA: Diagnosis not present

## 2014-02-12 DIAGNOSIS — T451X5A Adverse effect of antineoplastic and immunosuppressive drugs, initial encounter: Secondary | ICD-10-CM

## 2014-02-12 DIAGNOSIS — D469 Myelodysplastic syndrome, unspecified: Secondary | ICD-10-CM

## 2014-02-12 DIAGNOSIS — T50905A Adverse effect of unspecified drugs, medicaments and biological substances, initial encounter: Secondary | ICD-10-CM

## 2014-02-12 DIAGNOSIS — D72819 Decreased white blood cell count, unspecified: Secondary | ICD-10-CM

## 2014-02-12 DIAGNOSIS — D701 Agranulocytosis secondary to cancer chemotherapy: Secondary | ICD-10-CM

## 2014-02-12 DIAGNOSIS — J069 Acute upper respiratory infection, unspecified: Secondary | ICD-10-CM | POA: Insufficient documentation

## 2014-02-12 DIAGNOSIS — C9201 Acute myeloblastic leukemia, in remission: Secondary | ICD-10-CM

## 2014-02-12 DIAGNOSIS — D630 Anemia in neoplastic disease: Secondary | ICD-10-CM

## 2014-02-12 DIAGNOSIS — J3489 Other specified disorders of nose and nasal sinuses: Secondary | ICD-10-CM

## 2014-02-12 DIAGNOSIS — D6959 Other secondary thrombocytopenia: Secondary | ICD-10-CM

## 2014-02-12 LAB — CBC WITH DIFFERENTIAL/PLATELET
BASO%: 0.3 % (ref 0.0–2.0)
BASOS ABS: 0 10*3/uL (ref 0.0–0.1)
EOS%: 0.3 % (ref 0.0–7.0)
Eosinophils Absolute: 0 10*3/uL (ref 0.0–0.5)
HEMATOCRIT: 25.3 % — AB (ref 38.4–49.9)
HEMOGLOBIN: 8.5 g/dL — AB (ref 13.0–17.1)
LYMPH#: 0.4 10*3/uL — AB (ref 0.9–3.3)
LYMPH%: 10.6 % — ABNORMAL LOW (ref 14.0–49.0)
MCH: 29.1 pg (ref 27.2–33.4)
MCHC: 33.6 g/dL (ref 32.0–36.0)
MCV: 86.6 fL (ref 79.3–98.0)
MONO#: 0.5 10*3/uL (ref 0.1–0.9)
MONO%: 15.5 % — ABNORMAL HIGH (ref 0.0–14.0)
NEUT%: 73.3 % (ref 39.0–75.0)
NEUTROS ABS: 2.5 10*3/uL (ref 1.5–6.5)
NRBC: 0 % (ref 0–0)
Platelets: 26 10*3/uL — ABNORMAL LOW (ref 140–400)
RBC: 2.92 10*6/uL — ABNORMAL LOW (ref 4.20–5.82)
RDW: 16.6 % — ABNORMAL HIGH (ref 11.0–14.6)
WBC: 3.4 10*3/uL — AB (ref 4.0–10.3)

## 2014-02-12 LAB — PREPARE RBC (CROSSMATCH)

## 2014-02-12 LAB — HOLD TUBE, BLOOD BANK

## 2014-02-12 MED ORDER — SODIUM CHLORIDE 0.9 % IJ SOLN
10.0000 mL | INTRAMUSCULAR | Status: AC | PRN
  Administered 2014-02-12: 10 mL
  Filled 2014-02-12: qty 10

## 2014-02-12 MED ORDER — HEPARIN SOD (PORK) LOCK FLUSH 100 UNIT/ML IV SOLN
500.0000 [IU] | Freq: Every day | INTRAVENOUS | Status: AC | PRN
Start: 2014-02-12 — End: 2014-02-12
  Administered 2014-02-12: 500 [IU]
  Filled 2014-02-12: qty 5

## 2014-02-12 MED ORDER — SODIUM CHLORIDE 0.9 % IV SOLN
250.0000 mL | Freq: Once | INTRAVENOUS | Status: AC
  Administered 2014-02-12: 250 mL via INTRAVENOUS

## 2014-02-12 MED ORDER — TRIAMCINOLONE ACETONIDE 55 MCG/ACT NA AERO: 2.0000 | INHALATION_SPRAY | Freq: Every day | NASAL | Status: DC

## 2014-02-12 MED ORDER — VALACYCLOVIR HCL 1 G PO TABS: 1000.0000 mg | ORAL_TABLET | Freq: Every day | ORAL | Status: DC

## 2014-02-12 NOTE — Assessment & Plan Note (Signed)
We discussed some of the risks, benefits, and alternatives of blood transfusions. The patient is symptomatic from anemia and the hemoglobin level is critically low.  Some of the side-effects to be expected including risks of transfusion reactions, chills, infection, syndrome of volume overload and risk of hospitalization from various reasons and the patient is willing to proceed and went ahead to sign consent today. Per guidelines from [facility], we will give him 2 units of blood whenever his hemoglobin dropped to less than 9 g as the patient is symptomatic

## 2014-02-12 NOTE — Patient Instructions (Signed)

## 2014-02-12 NOTE — Assessment & Plan Note (Signed)
This history is suggestive of viral upper respiratory tract infection. He is already taking Levaquin. Today he is not neutropenic. He has a lot of nasal drainage and I recommend Nasacort to dry out the nasal secretion. He is willing to try.

## 2014-02-12 NOTE — Assessment & Plan Note (Signed)
This is likely due to recent treatment. He will continue prophylactic antimicrobial therapy. For some reason, he is not on prophylactic antiviral treatment. I will start him on 1 g of Valtrex daily as prophylaxis.

## 2014-02-12 NOTE — Progress Notes (Signed)
1015:1st Unit of blood: Checked with Etta Grandchild. Unit #: [id number] (A positive/Y filter set/ Port implanted). PRBCs at 151ml/30cc for 15 minutes. 1030: 118ml/ 300cc.Stopped:1205. Volume: 365ml

## 2014-02-12 NOTE — Progress Notes (Signed)
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, NPM1 positive   09/17/2013 -  Chemotherapy He was given induction chemotherapy at [facility] (7+3)   10/01/2013 -  Chemotherapy He had repeat induction chemotherapy due to persistent disease (5+2)   11/06/2013 Bone Marrow Biopsy Bone marrow biopsy confirmed remission   11/13/2013 - 11/17/2013 Chemotherapy He received cycle 1 of consolidation chemotherapy with HiDAC   12/18/2013 - 12/22/2013 Chemotherapy He received cycle 2 of consolidation chemotherapy with HiDAC   01/23/2014 - 01/28/2014 Chemotherapy he received cycle 3 of consolidation chemotherapy with HiDAC    INTERVAL HISTORY: Please see below for problem oriented charting. He is seen urgently today due to worsening upper respiratory tract infection. He have a lot of nasal drainage, sinus congestion and sore throat. He denies any fevers or chills. He complained of dizziness and have increasing shortness of breath on minimal exertion.  REVIEW OF SYSTEMS:   Constitutional: Denies fevers, chills or abnormal weight loss Eyes: Denies blurriness of vision Cardiovascular: Denies palpitation, chest discomfort or lower extremity swelling Gastrointestinal:  Denies nausea, heartburn or change in bowel habits Skin: Denies abnormal skin rashes Lymphatics: Denies new lymphadenopathy or easy bruising Neurological:Denies numbness, tingling or new weaknesses Behavioral/Psych: Mood is stable, no new changes  All other systems were reviewed with the patient and are negative.  I have reviewed the past medical history, past surgical history, social history and family history with the patient and they are unchanged from previous note.  ALLERGIES:  is allergic to morphine and related.  MEDICATIONS:   Current Outpatient Prescriptions  Medication Sig Dispense Refill  . atenolol (TENORMIN) 25 MG tablet Take 12.5 mg by mouth every morning.    Marland Kitchen glucose blood (FREESTYLE LITE) test strip Test once daily dx 250.00 100 each 3  . insulin lispro protamine-lispro (HUMALOG 75/25 MIX) (75-25) 100 UNIT/ML SUSP injection Inject 15 Units into the skin at bedtime. 3 vial 11  . INSULIN SYRINGE .5CC/29G (B-D INSULIN SYRINGE) 29G X 1/2" 0.5 ML MISC Use once daily, dx 250.00 100 each 3  . ondansetron (ZOFRAN) 8 MG tablet Take 8 mg by mouth as needed.    . simvastatin (ZOCOR) 20 MG tablet Take 1 tablet (20 mg total) by mouth at bedtime. 100 tablet 3  . triamcinolone (NASACORT) 55 MCG/ACT AERO nasal inhaler Place 2 sprays into the nose daily. 1 Inhaler 12  . valACYclovir (VALTREX) 1000 MG tablet Take 1 tablet (1,000 mg total) by mouth daily. 30 tablet 3   Current Facility-Administered Medications  Medication Dose Route Frequency Provider Last Rate Last Dose  . heparin lock flush 100 unit/mL  500 Units Intracatheter Daily PRN Khoa Opdahl, MD      . sodium chloride 0.9 % injection 10 mL  10 mL Intracatheter PRN Heath Lark, MD        PHYSICAL EXAMINATION: ECOG PERFORMANCE STATUS: 1 - Symptomatic but completely ambulatory  Filed Vitals:   02/12/14 0919  BP: 127/66  Pulse: 95  Temp: 98.5 F (36.9 C)  Resp: 18   There were no vitals filed for this visit.  GENERAL:alert, no distress and comfortable SKIN: skin color is pale, texture, turgor are normal, no rashes or significant lesions EYES: normal, Conjunctiva are pale and non-injected, sclera clear Musculoskeletal:no cyanosis of digits and no  clubbing  NEURO: alert & oriented x 3 with fluent speech, no focal motor/sensory deficits  LABORATORY DATA:  I have reviewed the data as listed    Component Value Date/Time   NA 139 02/09/2014 0830   NA 139 08/22/2013 0758   K 4.1 02/09/2014 0830   K 3.9 08/22/2013 0758   CL 104 08/22/2013 0758   CO2 24  02/09/2014 0830   CO2 22 08/22/2013 0758   GLUCOSE 311* 02/09/2014 0830   GLUCOSE 120* 08/22/2013 0758   BUN 24.7 02/09/2014 0830   BUN 19 08/22/2013 0758   CREATININE 1.2 02/09/2014 0830   CREATININE 0.9 08/22/2013 0758   CALCIUM 9.5 02/09/2014 0830   CALCIUM 8.8 08/22/2013 0758   PROT 7.1 02/09/2014 0830   PROT 6.6 08/22/2013 0758   ALBUMIN 3.4* 02/09/2014 0830   ALBUMIN 3.8 08/22/2013 0758   AST 12 02/09/2014 0830   AST 23 08/22/2013 0758   ALT 15 02/09/2014 0830   ALT 17 08/22/2013 0758   ALKPHOS 103 02/09/2014 0830   ALKPHOS 41 08/22/2013 0758   BILITOT 0.61 02/09/2014 0830   BILITOT 0.8 08/22/2013 0758   GFRNONAA 71.09 01/24/2010 0753   GFRAA 78 01/20/2008 0755    No results found for: SPEP, UPEP  Lab Results  Component Value Date   WBC 3.4* 02/12/2014   NEUTROABS 2.5 02/12/2014   HGB 8.5* 02/12/2014   HCT 25.3* 02/12/2014   MCV 86.6 02/12/2014   PLT 26* 02/12/2014      Chemistry      Component Value Date/Time   NA 139 02/09/2014 0830   NA 139 08/22/2013 0758   K 4.1 02/09/2014 0830   K 3.9 08/22/2013 0758   CL 104 08/22/2013 0758   CO2 24 02/09/2014 0830   CO2 22 08/22/2013 0758   BUN 24.7 02/09/2014 0830   BUN 19 08/22/2013 0758   CREATININE 1.2 02/09/2014 0830   CREATININE 0.9 08/22/2013 0758      Component Value Date/Time   CALCIUM 9.5 02/09/2014 0830   CALCIUM 8.8 08/22/2013 0758   ALKPHOS 103 02/09/2014 0830   ALKPHOS 41 08/22/2013 0758   AST 12 02/09/2014 0830   AST 23 08/22/2013 0758   ALT 15 02/09/2014 0830   ALT 17 08/22/2013 0758   BILITOT 0.61 02/09/2014 0830   BILITOT 0.8 08/22/2013 0758       ASSESSMENT & PLAN:  AML (acute myeloid leukemia) in remission He tolerated treatment well. He will return to [facility] on 03/05/14 for cycle 4 of consolidation chemotherapy. Continue supportive care   Anemia in neoplastic disease We discussed some of the risks, benefits, and alternatives of blood transfusions.  The patient is symptomatic from anemia and the hemoglobin level is critically low.  Some of the side-effects to be expected including risks of transfusion reactions, chills, infection, syndrome of volume overload and risk of hospitalization from various reasons and the patient is willing to proceed and went ahead to sign consent today. Per guidelines from [facility], we will give him 2 units of blood whenever his hemoglobin dropped to less than 9 g as the patient is symptomatic  Leukopenia due to antineoplastic chemotherapy This is likely due to recent treatment. He will continue prophylactic antimicrobial therapy. For some reason, he is not on prophylactic antiviral treatment. I will start him on 1 g of Valtrex daily as prophylaxis.    Thrombocytopenia due to drugs This is likely due to recent treatment. The patient denies recent history  of bleeding such as epistaxis, hematuria or hematochezia. He is asymptomatic from the low platelet count. I will observe for now.   Per protocol/instrucion from [facility] will transfuse 1 unit of irradiated platelets if platelet count is less than 20,000 or bleeding.  Upper respiratory tract infection This history is suggestive of viral upper respiratory tract infection. He is already taking Levaquin. Today he is not neutropenic. He has a lot of nasal drainage and I recommend Nasacort to dry out the nasal secretion. He is willing to try.    No orders of the defined types were placed in this encounter.   All questions were answered. The patient knows to call the clinic with any problems, questions or concerns. No barriers to learning was detected. I spent 30 minutes counseling the patient face to face. The total time spent in the appointment was 40 minutes and more than 50% was on counseling and review of test results     [facility], [place], MD 02/12/2014 11:08 AM

## 2014-02-12 NOTE — Assessment & Plan Note (Signed)
This is likely due to recent treatment. The patient denies recent history of bleeding such as epistaxis, hematuria or hematochezia. He is asymptomatic from the low platelet count. I will observe for now.   Per protocol/instrucion from [facility] will transfuse 1 unit of irradiated platelets if platelet count is less than 20,000 or bleeding.

## 2014-02-12 NOTE — Assessment & Plan Note (Signed)
He tolerated treatment well. He will return to [facility] on 03/05/14 for cycle 4 of consolidation chemotherapy. Continue supportive care

## 2014-02-13 LAB — TYPE AND SCREEN
ABO/RH(D): A POS
Antibody Screen: NEGATIVE
UNIT DIVISION: 0
Unit division: 0

## 2014-02-14 ENCOUNTER — Ambulatory Visit (HOSPITAL_COMMUNITY)
Admission: RE | Admit: 2014-02-14 | Discharge: 2014-02-14 | Disposition: A | Discharge status: Home / Self Care | Payer: Managed Care, Other (non HMO) | Source: Ambulatory Visit | Attending: Hematology and Oncology | Admitting: Hematology and Oncology

## 2014-02-16 ENCOUNTER — Other Ambulatory Visit: Payer: Self-pay | Admitting: *Deleted

## 2014-02-16 ENCOUNTER — Ambulatory Visit: Payer: Managed Care, Other (non HMO)

## 2014-02-16 ENCOUNTER — Other Ambulatory Visit (HOSPITAL_BASED_OUTPATIENT_CLINIC_OR_DEPARTMENT_OTHER): Payer: Managed Care, Other (non HMO)

## 2014-02-16 DIAGNOSIS — C9201 Acute myeloblastic leukemia, in remission: Secondary | ICD-10-CM

## 2014-02-16 DIAGNOSIS — D630 Anemia in neoplastic disease: Secondary | ICD-10-CM

## 2014-02-16 LAB — CBC WITH DIFFERENTIAL/PLATELET
BASO%: 0.2 % (ref 0.0–2.0)
Basophils Absolute: 0 10*3/uL (ref 0.0–0.1)
EOS%: 0.1 % (ref 0.0–7.0)
Eosinophils Absolute: 0 10*3/uL (ref 0.0–0.5)
HCT: 32.7 % — ABNORMAL LOW (ref 38.4–49.9)
HGB: 10.7 g/dL — ABNORMAL LOW (ref 13.0–17.1)
LYMPH%: 10.6 % — AB (ref 14.0–49.0)
MCH: 28.5 pg (ref 27.2–33.4)
MCHC: 32.7 g/dL (ref 32.0–36.0)
MCV: 87.1 fL (ref 79.3–98.0)
MONO#: 0.8 10*3/uL (ref 0.1–0.9)
MONO%: 20.6 % — ABNORMAL HIGH (ref 0.0–14.0)
NEUT#: 2.7 10*3/uL (ref 1.5–6.5)
NEUT%: 68.5 % (ref 39.0–75.0)
PLATELETS: 31 10*3/uL — AB (ref 140–400)
RBC: 3.76 10*6/uL — ABNORMAL LOW (ref 4.20–5.82)
RDW: 17.2 % — ABNORMAL HIGH (ref 11.0–14.6)
WBC: 3.9 10*3/uL — AB (ref 4.0–10.3)
lymph#: 0.4 10*3/uL — ABNORMAL LOW (ref 0.9–3.3)

## 2014-02-16 LAB — COMPREHENSIVE METABOLIC PANEL (CC13)
ALBUMIN: 3.5 g/dL (ref 3.5–5.0)
ALT: 21 U/L (ref 0–55)
AST: 21 U/L (ref 5–34)
Alkaline Phosphatase: 102 U/L (ref 40–150)
Anion Gap: 10 mEq/L (ref 3–11)
BUN: 15 mg/dL (ref 7.0–26.0)
CALCIUM: 9.3 mg/dL (ref 8.4–10.4)
CO2: 27 meq/L (ref 22–29)
Chloride: 102 mEq/L (ref 98–109)
Creatinine: 1.1 mg/dL (ref 0.7–1.3)
EGFR: 71 mL/min/{1.73_m2} — AB (ref >90)
Glucose: 163 mg/dl — ABNORMAL HIGH (ref 70–140)
Potassium: 4.2 mEq/L (ref 3.5–5.1)
SODIUM: 140 meq/L (ref 136–145)
Total Bilirubin: 0.4 mg/dL (ref 0.20–1.20)
Total Protein: 7.2 g/dL (ref 6.4–8.3)

## 2014-02-16 LAB — MAGNESIUM (CC13): Magnesium: 2.3 mg/dl (ref 1.5–2.5)

## 2014-02-16 LAB — HOLD TUBE, BLOOD BANK

## 2014-02-16 NOTE — Progress Notes (Signed)
Reviewed counts with Dr. Alvy Bimler, no transfusion necessary today.  Spoke to patient in the lobby.  Pt given copy of lab work.  Pt to return Thursday for repeat labs.  Pt verbalized understanding.

## 2014-02-19 ENCOUNTER — Other Ambulatory Visit: Payer: Self-pay | Admitting: *Deleted

## 2014-02-19 ENCOUNTER — Other Ambulatory Visit (HOSPITAL_BASED_OUTPATIENT_CLINIC_OR_DEPARTMENT_OTHER): Payer: Managed Care, Other (non HMO)

## 2014-02-19 ENCOUNTER — Telehealth: Payer: Self-pay | Admitting: Hematology and Oncology

## 2014-02-19 DIAGNOSIS — C9201 Acute myeloblastic leukemia, in remission: Secondary | ICD-10-CM

## 2014-02-19 LAB — CBC WITH DIFFERENTIAL/PLATELET
BASO%: 0.2 % (ref 0.0–2.0)
Basophils Absolute: 0 10*3/uL (ref 0.0–0.1)
EOS%: 0 % (ref 0.0–7.0)
Eosinophils Absolute: 0 10*3/uL (ref 0.0–0.5)
HCT: 31 % — ABNORMAL LOW (ref 38.4–49.9)
HGB: 10.5 g/dL — ABNORMAL LOW (ref 13.0–17.1)
LYMPH%: 9.9 % — ABNORMAL LOW (ref 14.0–49.0)
MCH: 29.2 pg (ref 27.2–33.4)
MCHC: 33.9 g/dL (ref 32.0–36.0)
MCV: 86.4 fL (ref 79.3–98.0)
MONO#: 0.9 10*3/uL (ref 0.1–0.9)
MONO%: 22.2 % — ABNORMAL HIGH (ref 0.0–14.0)
NEUT%: 67.7 % (ref 39.0–75.0)
NEUTROS ABS: 2.8 10*3/uL (ref 1.5–6.5)
Platelets: 43 10*3/uL — ABNORMAL LOW (ref 140–400)
RBC: 3.59 10*6/uL — ABNORMAL LOW (ref 4.20–5.82)
RDW: 16 % — AB (ref 11.0–14.6)
WBC: 4.2 10*3/uL (ref 4.0–10.3)
lymph#: 0.4 10*3/uL — ABNORMAL LOW (ref 0.9–3.3)

## 2014-02-19 LAB — HOLD TUBE, BLOOD BANK

## 2014-02-19 NOTE — Progress Notes (Signed)
S/w pt in lobby.  Gave him copy of lab work,  No need for transfusion today.  He requests lab be added on Monday 1/18.  He goes back to [facility] on Thursday 1/21.   Request sent to scheduler to add lab for 1/18.

## 2014-02-19 NOTE — Telephone Encounter (Signed)
s.w. pt and advised on added appts....pt ok and aware °

## 2014-02-23 ENCOUNTER — Other Ambulatory Visit (HOSPITAL_BASED_OUTPATIENT_CLINIC_OR_DEPARTMENT_OTHER): Payer: Managed Care, Other (non HMO)

## 2014-02-23 DIAGNOSIS — C9201 Acute myeloblastic leukemia, in remission: Secondary | ICD-10-CM

## 2014-02-23 DIAGNOSIS — D630 Anemia in neoplastic disease: Secondary | ICD-10-CM

## 2014-02-23 LAB — CBC WITH DIFFERENTIAL/PLATELET
BASO%: 0.3 % (ref 0.0–2.0)
Basophils Absolute: 0 10*3/uL (ref 0.0–0.1)
EOS%: 0 % (ref 0.0–7.0)
Eosinophils Absolute: 0 10*3/uL (ref 0.0–0.5)
HCT: 30.6 % — ABNORMAL LOW (ref 38.4–49.9)
HGB: 10.2 g/dL — ABNORMAL LOW (ref 13.0–17.1)
LYMPH%: 15.1 % (ref 14.0–49.0)
MCH: 29.6 pg (ref 27.2–33.4)
MCHC: 33.3 g/dL (ref 32.0–36.0)
MCV: 88.7 fL (ref 79.3–98.0)
MONO#: 0.5 10*3/uL (ref 0.1–0.9)
MONO%: 13.6 % (ref 0.0–14.0)
NEUT#: 2.5 10*3/uL (ref 1.5–6.5)
NEUT%: 71 % (ref 39.0–75.0)
Platelets: 76 10*3/uL — ABNORMAL LOW (ref 140–400)
RBC: 3.45 10*6/uL — ABNORMAL LOW (ref 4.20–5.82)
RDW: 16.4 % — ABNORMAL HIGH (ref 11.0–14.6)
WBC: 3.5 10*3/uL — ABNORMAL LOW (ref 4.0–10.3)
lymph#: 0.5 10*3/uL — ABNORMAL LOW (ref 0.9–3.3)

## 2014-02-23 LAB — COMPREHENSIVE METABOLIC PANEL (CC13)
ALK PHOS: 95 U/L (ref 40–150)
ALT: 21 U/L (ref 0–55)
ANION GAP: 10 meq/L (ref 3–11)
AST: 20 U/L (ref 5–34)
Albumin: 3.5 g/dL (ref 3.5–5.0)
BUN: 18 mg/dL (ref 7.0–26.0)
CALCIUM: 9.2 mg/dL (ref 8.4–10.4)
CO2: 25 meq/L (ref 22–29)
Chloride: 105 mEq/L (ref 98–109)
Creatinine: 1 mg/dL (ref 0.7–1.3)
EGFR: 73 mL/min/{1.73_m2} — ABNORMAL LOW (ref >90)
Glucose: 298 mg/dl — ABNORMAL HIGH (ref 70–140)
Potassium: 4.1 mEq/L (ref 3.5–5.1)
Sodium: 139 mEq/L (ref 136–145)
Total Bilirubin: 0.46 mg/dL (ref 0.20–1.20)
Total Protein: 6.8 g/dL (ref 6.4–8.3)

## 2014-02-23 LAB — HOLD TUBE, BLOOD BANK

## 2014-02-23 LAB — MAGNESIUM (CC13): Magnesium: 2.2 mg/dl (ref 1.5–2.5)

## 2014-02-26 ENCOUNTER — Other Ambulatory Visit: Payer: Managed Care, Other (non HMO)

## 2014-03-02 ENCOUNTER — Other Ambulatory Visit (HOSPITAL_BASED_OUTPATIENT_CLINIC_OR_DEPARTMENT_OTHER): Payer: Managed Care, Other (non HMO)

## 2014-03-02 DIAGNOSIS — D630 Anemia in neoplastic disease: Secondary | ICD-10-CM

## 2014-03-02 DIAGNOSIS — C9201 Acute myeloblastic leukemia, in remission: Secondary | ICD-10-CM

## 2014-03-02 LAB — HOLD TUBE, BLOOD BANK

## 2014-03-02 LAB — CBC WITH DIFFERENTIAL/PLATELET
BASO%: 0.5 % (ref 0.0–2.0)
BASOS ABS: 0 10*3/uL (ref 0.0–0.1)
EOS%: 0.3 % (ref 0.0–7.0)
Eosinophils Absolute: 0 10*3/uL (ref 0.0–0.5)
HEMATOCRIT: 31 % — AB (ref 38.4–49.9)
HGB: 10.2 g/dL — ABNORMAL LOW (ref 13.0–17.1)
LYMPH#: 0.5 10*3/uL — AB (ref 0.9–3.3)
LYMPH%: 12.6 % — ABNORMAL LOW (ref 14.0–49.0)
MCH: 30.5 pg (ref 27.2–33.4)
MCHC: 32.9 g/dL (ref 32.0–36.0)
MCV: 92.8 fL (ref 79.3–98.0)
MONO#: 0.5 10*3/uL (ref 0.1–0.9)
MONO%: 13.1 % (ref 0.0–14.0)
NEUT%: 73.5 % (ref 39.0–75.0)
NEUTROS ABS: 2.8 10*3/uL (ref 1.5–6.5)
Platelets: 126 10*3/uL — ABNORMAL LOW (ref 140–400)
RBC: 3.34 10*6/uL — ABNORMAL LOW (ref 4.20–5.82)
RDW: 22.1 % — ABNORMAL HIGH (ref 11.0–14.6)
WBC: 3.8 10*3/uL — ABNORMAL LOW (ref 4.0–10.3)
nRBC: 4 % — ABNORMAL HIGH (ref 0–0)

## 2014-03-02 LAB — COMPREHENSIVE METABOLIC PANEL (CC13)
ALBUMIN: 3.4 g/dL — AB (ref 3.5–5.0)
ALT: 20 U/L (ref 0–55)
AST: 22 U/L (ref 5–34)
Alkaline Phosphatase: 76 U/L (ref 40–150)
Anion Gap: 11 mEq/L (ref 3–11)
BILIRUBIN TOTAL: 0.4 mg/dL (ref 0.20–1.20)
BUN: 14.2 mg/dL (ref 7.0–26.0)
CHLORIDE: 106 meq/L (ref 98–109)
CO2: 25 meq/L (ref 22–29)
Calcium: 8.7 mg/dL (ref 8.4–10.4)
Creatinine: 1 mg/dL (ref 0.7–1.3)
EGFR: 80 mL/min/{1.73_m2} — ABNORMAL LOW (ref >90)
GLUCOSE: 242 mg/dL — AB (ref 70–140)
Potassium: 3.6 mEq/L (ref 3.5–5.1)
SODIUM: 141 meq/L (ref 136–145)
TOTAL PROTEIN: 6.7 g/dL (ref 6.4–8.3)

## 2014-03-02 LAB — MAGNESIUM (CC13): MAGNESIUM: 2.2 mg/dL (ref 1.5–2.5)

## 2014-03-05 ENCOUNTER — Telehealth: Payer: Self-pay | Admitting: *Deleted

## 2014-03-05 NOTE — Telephone Encounter (Signed)
Call from [place] at [facility].  Pt will need injection for Neulasta on 1/29.   They faxed over orders for Neulasta and labs on 1/29.  CBC every Monday, Wed and Friday and CMP and Mag every Monday. Dr. Alvy Bimler aware.  Pt already scheduled for lab/MD on 1/29.  Request sent to scheduler for injection appt.  Notified Jill at [facility] of appts.

## 2014-03-13 ENCOUNTER — Ambulatory Visit (HOSPITAL_BASED_OUTPATIENT_CLINIC_OR_DEPARTMENT_OTHER): Payer: Managed Care, Other (non HMO) | Admitting: Hematology and Oncology

## 2014-03-13 ENCOUNTER — Ambulatory Visit (HOSPITAL_BASED_OUTPATIENT_CLINIC_OR_DEPARTMENT_OTHER): Payer: Managed Care, Other (non HMO)

## 2014-03-13 ENCOUNTER — Other Ambulatory Visit (HOSPITAL_BASED_OUTPATIENT_CLINIC_OR_DEPARTMENT_OTHER): Payer: Managed Care, Other (non HMO)

## 2014-03-13 ENCOUNTER — Telehealth: Payer: Self-pay | Admitting: Hematology and Oncology

## 2014-03-13 ENCOUNTER — Encounter: Payer: Self-pay | Admitting: *Deleted

## 2014-03-13 ENCOUNTER — Encounter: Payer: Self-pay | Admitting: Hematology and Oncology

## 2014-03-13 VITALS — BP 150/75 | HR 75 | Temp 97.7°F | Resp 18 | Ht 68.75 in | Wt 202.5 lb

## 2014-03-13 DIAGNOSIS — D72819 Decreased white blood cell count, unspecified: Secondary | ICD-10-CM

## 2014-03-13 DIAGNOSIS — C9201 Acute myeloblastic leukemia, in remission: Secondary | ICD-10-CM

## 2014-03-13 DIAGNOSIS — T50905A Adverse effect of unspecified drugs, medicaments and biological substances, initial encounter: Secondary | ICD-10-CM

## 2014-03-13 DIAGNOSIS — D630 Anemia in neoplastic disease: Secondary | ICD-10-CM

## 2014-03-13 DIAGNOSIS — T451X5A Adverse effect of antineoplastic and immunosuppressive drugs, initial encounter: Secondary | ICD-10-CM

## 2014-03-13 DIAGNOSIS — Z5189 Encounter for other specified aftercare: Secondary | ICD-10-CM

## 2014-03-13 DIAGNOSIS — D6959 Other secondary thrombocytopenia: Secondary | ICD-10-CM

## 2014-03-13 DIAGNOSIS — D701 Agranulocytosis secondary to cancer chemotherapy: Secondary | ICD-10-CM

## 2014-03-13 LAB — CBC WITH DIFFERENTIAL/PLATELET
BASO%: 0.9 % (ref 0.0–2.0)
BASOS ABS: 0 10*3/uL (ref 0.0–0.1)
EOS%: 0.9 % (ref 0.0–7.0)
Eosinophils Absolute: 0 10*3/uL (ref 0.0–0.5)
HCT: 31 % — ABNORMAL LOW (ref 38.4–49.9)
HGB: 10.5 g/dL — ABNORMAL LOW (ref 13.0–17.1)
LYMPH#: 0.2 10*3/uL — AB (ref 0.9–3.3)
LYMPH%: 14.7 % (ref 14.0–49.0)
MCH: 31.1 pg (ref 27.2–33.4)
MCHC: 33.9 g/dL (ref 32.0–36.0)
MCV: 91.7 fL (ref 79.3–98.0)
MONO#: 0 10*3/uL — ABNORMAL LOW (ref 0.1–0.9)
MONO%: 0.9 % (ref 0.0–14.0)
NEUT#: 0.9 10*3/uL — ABNORMAL LOW (ref 1.5–6.5)
NEUT%: 82.6 % — ABNORMAL HIGH (ref 39.0–75.0)
NRBC: 0 % (ref 0–0)
PLATELETS: 36 10*3/uL — AB (ref 140–400)
RBC: 3.38 10*6/uL — ABNORMAL LOW (ref 4.20–5.82)
RDW: 20.3 % — ABNORMAL HIGH (ref 11.0–14.6)
WBC: 1.1 10*3/uL — ABNORMAL LOW (ref 4.0–10.3)

## 2014-03-13 LAB — HOLD TUBE, BLOOD BANK

## 2014-03-13 MED ORDER — PEGFILGRASTIM INJECTION 6 MG/0.6ML
6.0000 mg | Freq: Once | SUBCUTANEOUS | Status: AC
  Administered 2014-03-13: 6 mg via SUBCUTANEOUS
  Filled 2014-03-13: qty 0.6

## 2014-03-13 NOTE — Progress Notes (Signed)
CBC results faxed to [facility] #[phone].

## 2014-03-13 NOTE — Telephone Encounter (Signed)
gv adn printed appt sched and avs for pt for Feb...sed added tx...emailed [facility] to add tx.

## 2014-03-13 NOTE — Assessment & Plan Note (Signed)
This is likely due to recent treatment. He will receive Neulasta support today.

## 2014-03-13 NOTE — Progress Notes (Signed)
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, NPM1 positive   09/17/2013 -  Chemotherapy He was given induction chemotherapy at [facility] (7+3)   10/01/2013 -  Chemotherapy He had repeat induction chemotherapy due to persistent disease (5+2)   11/06/2013 Bone Marrow Biopsy Bone marrow biopsy confirmed remission   11/13/2013 - 11/17/2013 Chemotherapy He received cycle 1 of consolidation chemotherapy with Hi[facility]   12/18/2013 - 12/22/2013 Chemotherapy He received cycle 2 of consolidation chemotherapy with Hi[facility]   01/23/2014 - 01/28/2014 Chemotherapy he received cycle 3 of consolidation chemotherapy with Hi[facility]   03/05/2014 - 03/10/2014 Hospital Admission he received cycle 4 of consolidation chemotherapy with Hi[facility]    INTERVAL HISTORY: Please see below for problem oriented charting. He returns today for supportive care. He was recently discharged after cycle 4 of consolidation treatment. He is not symptomatic. The patient denies any recent signs or symptoms of bleeding such as spontaneous epistaxis, hematuria or hematochezia.   REVIEW OF SYSTEMS:   Constitutional: Denies fevers, chills or abnormal weight loss Eyes: Denies blurriness of vision Ears, nose, mouth, throat, and face: Denies mucositis or sore throat Respiratory: Denies cough, dyspnea or wheezes Cardiovascular: Denies palpitation, chest discomfort or lower extremity swelling Gastrointestinal:  Denies nausea, heartburn or change in bowel habits Skin: Denies abnormal skin rashes Lymphatics: Denies new lymphadenopathy or easy bruising Neurological:Denies numbness, tingling or new weaknesses Behavioral/Psych: Mood is stable, no new changes  All other systems were reviewed with the patient and are negative.  I have reviewed the  past medical history, past surgical history, social history and family history with the patient and they are unchanged from previous note.  ALLERGIES:  is allergic to morphine and related.  MEDICATIONS:  Current Outpatient Prescriptions  Medication Sig Dispense Refill  . atenolol (TENORMIN) 25 MG tablet Take 12.5 mg by mouth every morning.    . fluconazole (DIFLUCAN) 200 MG tablet Take 200 mg by mouth daily.    Marland Kitchen glucose blood (FREESTYLE LITE) test strip Test once daily dx 250.00 100 each 3  . insulin lispro protamine-lispro (HUMALOG 75/25 MIX) (75-25) 100 UNIT/ML SUSP injection Inject 15 Units into the skin at bedtime. 3 vial 11  . INSULIN SYRINGE .5CC/29G (B-D INSULIN SYRINGE) 29G X 1/2" 0.5 ML MISC Use once daily, dx 250.00 100 each 3  . levofloxacin (LEVAQUIN) 500 MG tablet Take 500 mg by mouth daily.    . metFORMIN (GLUCOPHAGE) 500 MG tablet Take 500 mg by mouth daily with breakfast.    . simvastatin (ZOCOR) 20 MG tablet Take 1 tablet (20 mg total) by mouth at bedtime. 100 tablet 3   No current facility-administered medications for this visit.    PHYSICAL EXAMINATION: ECOG PERFORMANCE STATUS: 0 - Asymptomatic  Filed Vitals:   03/13/14 0904  BP: 150/75  Pulse: 75  Temp: 97.7 F (36.5 C)  Resp: 18   Filed Weights   03/13/14 0904  Weight: 202 lb 8 oz (91.853 kg)    GENERAL:alert, no distress and comfortable SKIN: skin color, texture, turgor are normal, no rashes or significant lesions EYES: normal, Conjunctiva are pink and non-injected, sclera clear OROPHARYNX:no exudate, no erythema and lips, buccal mucosa, and tongue normal  NECK: supple, thyroid normal size, non-tender, without nodularity LYMPH:  no palpable lymphadenopathy in the cervical, axillary  or inguinal LUNGS: clear to auscultation and percussion with normal breathing effort HEART: regular rate & rhythm and no murmurs and no lower extremity edema ABDOMEN:abdomen soft, non-tender and normal bowel  sounds Musculoskeletal:no cyanosis of digits and no clubbing  NEURO: alert & oriented x 3 with fluent speech, no focal motor/sensory deficits  LABORATORY DATA:  I have reviewed the data as listed    Component Value Date/Time   NA 141 03/02/2014 0932   NA 139 08/22/2013 0758   K 3.6 03/02/2014 0932   K 3.9 08/22/2013 0758   CL 104 08/22/2013 0758   CO2 25 03/02/2014 0932   CO2 22 08/22/2013 0758   GLUCOSE 242* 03/02/2014 0932   GLUCOSE 120* 08/22/2013 0758   BUN 14.2 03/02/2014 0932   BUN 19 08/22/2013 0758   CREATININE 1.0 03/02/2014 0932   CREATININE 0.9 08/22/2013 0758   CALCIUM 8.7 03/02/2014 0932   CALCIUM 8.8 08/22/2013 0758   PROT 6.7 03/02/2014 0932   PROT 6.6 08/22/2013 0758   ALBUMIN 3.4* 03/02/2014 0932   ALBUMIN 3.8 08/22/2013 0758   AST 22 03/02/2014 0932   AST 23 08/22/2013 0758   ALT 20 03/02/2014 0932   ALT 17 08/22/2013 0758   ALKPHOS 76 03/02/2014 0932   ALKPHOS 41 08/22/2013 0758   BILITOT 0.40 03/02/2014 0932   BILITOT 0.8 08/22/2013 0758   GFRNONAA 71.09 01/24/2010 0753   GFRAA 78 01/20/2008 0755    No results found for: SPEP, UPEP  Lab Results  Component Value Date   WBC 1.1* 03/13/2014   NEUTROABS 0.9* 03/13/2014   HGB 10.5* 03/13/2014   HCT 31.0* 03/13/2014   MCV 91.7 03/13/2014   PLT 36* 03/13/2014      Chemistry      Component Value Date/Time   NA 141 03/02/2014 0932   NA 139 08/22/2013 0758   K 3.6 03/02/2014 0932   K 3.9 08/22/2013 0758   CL 104 08/22/2013 0758   CO2 25 03/02/2014 0932   CO2 22 08/22/2013 0758   BUN 14.2 03/02/2014 0932   BUN 19 08/22/2013 0758   CREATININE 1.0 03/02/2014 0932   CREATININE 0.9 08/22/2013 0758      Component Value Date/Time   CALCIUM 8.7 03/02/2014 0932   CALCIUM 8.8 08/22/2013 0758   ALKPHOS 76 03/02/2014 0932   ALKPHOS 41 08/22/2013 0758   AST 22 03/02/2014 0932   AST 23 08/22/2013 0758   ALT 20 03/02/2014 0932   ALT 17 08/22/2013 0758   BILITOT 0.40 03/02/2014 0932   BILITOT 0.8  08/22/2013 0758      ASSESSMENT & PLAN:  AML (acute myeloid leukemia) in remission He tolerated treatment well. He will return to [facility] on 04/09/14 for repeat bone marrow biopsy. Continue supportive care. The patient wants to keep his port for a while. He will need port flushes appointment in the future.     Anemia in neoplastic disease Per guidelines from [facility], we will give him 2 units of blood whenever his hemoglobin dropped to less than 9 g as the patient is symptomatic. He does not need blood transfusion today. He will come back 3 times a week for blood draw.     Leukopenia due to antineoplastic chemotherapy This is likely due to recent treatment. He will receive Neulasta support today.     Thrombocytopenia due to drugs This is likely due to recent treatment. The patient denies recent history of bleeding such as epistaxis, hematuria or hematochezia.  He is asymptomatic from the low platelet count. I will observe for now.   Per protocol/instrucion from [facility] will transfuse 1 unit of irradiated platelets if platelet count is less than 20,000 or bleeding.    Orders Placed This Encounter  Procedures  . CBC with Differential/Platelet    Standing Status: Standing     Number of Occurrences: 22     Standing Expiration Date: 03/14/2015  . SCHEDULING COMMUNICATION INJECTION    Schedule 15 minute injection appointment   All questions were answered. The patient knows to call the clinic with any problems, questions or concerns. No barriers to learning was detected. I spent 30 minutes counseling the patient face to face. The total time spent in the appointment was 40 minutes and more than 50% was on counseling and review of test results     [facility], [place], MD 03/13/2014 10:40 AM

## 2014-03-13 NOTE — Assessment & Plan Note (Signed)
This is likely due to recent treatment. The patient denies recent history of bleeding such as epistaxis, hematuria or hematochezia. He is asymptomatic from the low platelet count. I will observe for now.   Per protocol/instrucion from [facility] will transfuse 1 unit of irradiated platelets if platelet count is less than 20,000 or bleeding.

## 2014-03-13 NOTE — Assessment & Plan Note (Signed)
Per guidelines from [facility], we will give him 2 units of blood whenever his hemoglobin dropped to less than 9 g as the patient is symptomatic. He does not need blood transfusion today. He will come back 3 times a week for blood draw.

## 2014-03-13 NOTE — Assessment & Plan Note (Signed)
He tolerated treatment well. He will return to [facility] on 04/09/14 for repeat bone marrow biopsy. Continue supportive care. The patient wants to keep his port for a while. He will need port flushes appointment in the future.

## 2014-03-16 ENCOUNTER — Encounter: Payer: Self-pay | Admitting: *Deleted

## 2014-03-16 ENCOUNTER — Other Ambulatory Visit (HOSPITAL_BASED_OUTPATIENT_CLINIC_OR_DEPARTMENT_OTHER): Payer: Managed Care, Other (non HMO)

## 2014-03-16 ENCOUNTER — Other Ambulatory Visit: Payer: Self-pay | Admitting: *Deleted

## 2014-03-16 ENCOUNTER — Ambulatory Visit (HOSPITAL_COMMUNITY)
Admission: RE | Admit: 2014-03-16 | Discharge: 2014-03-16 | Disposition: A | Discharge status: Home / Self Care | Payer: Managed Care, Other (non HMO) | Source: Ambulatory Visit | Attending: Hematology and Oncology | Admitting: Hematology and Oncology

## 2014-03-16 VITALS — BP 143/81 | HR 82 | Temp 98.0°F | Resp 18

## 2014-03-16 DIAGNOSIS — D630 Anemia in neoplastic disease: Secondary | ICD-10-CM

## 2014-03-16 DIAGNOSIS — C9200 Acute myeloblastic leukemia, not having achieved remission: Secondary | ICD-10-CM | POA: Insufficient documentation

## 2014-03-16 DIAGNOSIS — C9201 Acute myeloblastic leukemia, in remission: Secondary | ICD-10-CM

## 2014-03-16 LAB — CBC WITH DIFFERENTIAL/PLATELET
BASO%: 0 % (ref 0.0–2.0)
Basophils Absolute: 0 10*3/uL (ref 0.0–0.1)
EOS ABS: 0 10*3/uL (ref 0.0–0.5)
EOS%: 0 % (ref 0.0–7.0)
HEMATOCRIT: 30.7 % — AB (ref 38.4–49.9)
HEMOGLOBIN: 10.4 g/dL — AB (ref 13.0–17.1)
LYMPH#: 0.2 10*3/uL — AB (ref 0.9–3.3)
LYMPH%: 63.3 % — AB (ref 14.0–49.0)
MCH: 31.2 pg (ref 27.2–33.4)
MCHC: 33.9 g/dL (ref 32.0–36.0)
MCV: 92.2 fL (ref 79.3–98.0)
MONO#: 0 10*3/uL — ABNORMAL LOW (ref 0.1–0.9)
MONO%: 3.3 % (ref 0.0–14.0)
NEUT%: 33.4 % — ABNORMAL LOW (ref 39.0–75.0)
NEUTROS ABS: 0.1 10*3/uL — AB (ref 1.5–6.5)
PLATELETS: 6 10*3/uL — AB (ref 140–400)
RBC: 3.33 10*6/uL — AB (ref 4.20–5.82)
RDW: 19.9 % — AB (ref 11.0–14.6)
WBC: 0.3 10*3/uL — AB (ref 4.0–10.3)
nRBC: 0 % (ref 0–0)

## 2014-03-16 LAB — HOLD TUBE, BLOOD BANK

## 2014-03-16 LAB — COMPREHENSIVE METABOLIC PANEL (CC13)
ALK PHOS: 79 U/L (ref 40–150)
ALT: 19 U/L (ref 0–55)
ANION GAP: 9 meq/L (ref 3–11)
AST: 11 U/L (ref 5–34)
Albumin: 3.6 g/dL (ref 3.5–5.0)
BUN: 22.1 mg/dL (ref 7.0–26.0)
CALCIUM: 9.1 mg/dL (ref 8.4–10.4)
CO2: 24 mEq/L (ref 22–29)
CREATININE: 0.9 mg/dL (ref 0.7–1.3)
Chloride: 106 mEq/L (ref 98–109)
EGFR: 85 mL/min/{1.73_m2} — AB (ref >90)
Glucose: 202 mg/dl — ABNORMAL HIGH (ref 70–140)
Potassium: 4 mEq/L (ref 3.5–5.1)
Sodium: 140 mEq/L (ref 136–145)
Total Bilirubin: 0.81 mg/dL (ref 0.20–1.20)
Total Protein: 6.8 g/dL (ref 6.4–8.3)

## 2014-03-16 LAB — MAGNESIUM (CC13): MAGNESIUM: 2.1 mg/dL (ref 1.5–2.5)

## 2014-03-16 MED ORDER — SODIUM CHLORIDE 0.9 % IJ SOLN: 3.0000 mL | INTRAMUSCULAR | Status: DC | PRN

## 2014-03-16 MED ORDER — HEPARIN SOD (PORK) LOCK FLUSH 100 UNIT/ML IV SOLN
500.0000 [IU] | Freq: Every day | INTRAVENOUS | Status: AC | PRN
  Administered 2014-03-16: 500 [IU]
  Filled 2014-03-16: qty 5

## 2014-03-16 MED ORDER — SODIUM CHLORIDE 0.9 % IJ SOLN
10.0000 mL | INTRAMUSCULAR | Status: AC | PRN
  Administered 2014-03-16: 10 mL

## 2014-03-16 MED ORDER — HEPARIN SOD (PORK) LOCK FLUSH 100 UNIT/ML IV SOLN: 250.0000 [IU] | INTRAVENOUS | Status: DC | PRN

## 2014-03-16 MED ORDER — SODIUM CHLORIDE 0.9 % IV SOLN
250.0000 mL | Freq: Once | INTRAVENOUS | Status: AC
  Administered 2014-03-16: 250 mL via INTRAVENOUS

## 2014-03-16 NOTE — Progress Notes (Signed)
Diagnosis: AML (acute myeloblastic leukemia) MD: Heath Lark, MD  Treatment:  Pt's PAC accessed.  1 unit of platelets transfused per order.  No reaction suspected.  Pt's PAC flushed with heparin per order. PAC deaccessed, site unremarkable.   Pt discharged home in stable condition.

## 2014-03-16 NOTE — Progress Notes (Signed)
S/w pt in lobby this morning.  Gave him copy of CBC and he understands one unit of platelets ordered for today.  Pt went to [facility] for transfusion and he will return for lab on Wed 2/3 as scheduled.    Faxed labs to [facility] at fax [phone].  Pt scheduled at [facility] for transfusion for Wed 2/3.

## 2014-03-17 LAB — PREPARE PLATELET PHERESIS: Unit division: 0

## 2014-03-18 ENCOUNTER — Inpatient Hospital Stay (HOSPITAL_COMMUNITY): Admission: RE | Admit: 2014-03-18 | Payer: Managed Care, Other (non HMO) | Source: Ambulatory Visit

## 2014-03-18 ENCOUNTER — Other Ambulatory Visit (HOSPITAL_BASED_OUTPATIENT_CLINIC_OR_DEPARTMENT_OTHER): Payer: Managed Care, Other (non HMO)

## 2014-03-18 ENCOUNTER — Other Ambulatory Visit: Payer: Managed Care, Other (non HMO)

## 2014-03-18 DIAGNOSIS — D630 Anemia in neoplastic disease: Secondary | ICD-10-CM

## 2014-03-18 DIAGNOSIS — C9201 Acute myeloblastic leukemia, in remission: Secondary | ICD-10-CM

## 2014-03-18 LAB — TECHNOLOGIST REVIEW

## 2014-03-18 LAB — CBC WITH DIFFERENTIAL/PLATELET
HEMATOCRIT: 26.7 % — AB (ref 38.4–49.9)
HGB: 9.2 g/dL — ABNORMAL LOW (ref 13.0–17.1)
MCH: 31.4 pg (ref 27.2–33.4)
MCHC: 34.5 g/dL (ref 32.0–36.0)
MCV: 91.1 fL (ref 79.3–98.0)
Platelets: 22 10*3/uL — ABNORMAL LOW (ref 140–400)
RBC: 2.93 10*6/uL — ABNORMAL LOW (ref 4.20–5.82)
RDW: 19.5 % — ABNORMAL HIGH (ref 11.0–14.6)
WBC: <0.2 10*3/uL — CL (ref 4.0–10.3)

## 2014-03-18 LAB — HOLD TUBE, BLOOD BANK

## 2014-03-20 ENCOUNTER — Other Ambulatory Visit: Payer: Self-pay | Admitting: Hematology and Oncology

## 2014-03-20 ENCOUNTER — Ambulatory Visit (HOSPITAL_BASED_OUTPATIENT_CLINIC_OR_DEPARTMENT_OTHER): Payer: Managed Care, Other (non HMO)

## 2014-03-20 ENCOUNTER — Other Ambulatory Visit (HOSPITAL_BASED_OUTPATIENT_CLINIC_OR_DEPARTMENT_OTHER): Payer: Managed Care, Other (non HMO)

## 2014-03-20 VITALS — BP 126/77 | HR 90 | Temp 99.5°F | Resp 18

## 2014-03-20 DIAGNOSIS — C9200 Acute myeloblastic leukemia, not having achieved remission: Secondary | ICD-10-CM

## 2014-03-20 DIAGNOSIS — D630 Anemia in neoplastic disease: Secondary | ICD-10-CM

## 2014-03-20 DIAGNOSIS — C9201 Acute myeloblastic leukemia, in remission: Secondary | ICD-10-CM

## 2014-03-20 LAB — CBC WITH DIFFERENTIAL/PLATELET
BASO%: 0 % (ref 0.0–2.0)
Basophils Absolute: 0 10*3/uL (ref 0.0–0.1)
EOS%: 5 % (ref 0.0–7.0)
Eosinophils Absolute: 0 10*3/uL (ref 0.0–0.5)
HEMATOCRIT: 25.9 % — AB (ref 38.4–49.9)
HGB: 8.6 g/dL — ABNORMAL LOW (ref 13.0–17.1)
LYMPH%: 92.2 % — AB (ref 14.0–49.0)
MCH: 31.3 pg (ref 27.2–33.4)
MCHC: 33.3 g/dL (ref 32.0–36.0)
MCV: 93.9 fL (ref 79.3–98.0)
MONO#: 0 10*3/uL — AB (ref 0.1–0.9)
MONO%: 2.3 % (ref 0.0–14.0)
NEUT#: 0 10*3/uL — CL (ref 1.5–6.5)
NEUT%: 0.5 % — AB (ref 39.0–75.0)
Platelets: 9 10*3/uL — CL (ref 140–400)
RBC: 2.76 10*6/uL — AB (ref 4.20–5.82)
RDW: 22.2 % — ABNORMAL HIGH (ref 11.0–14.6)
WBC: 0.2 10*3/uL — AB (ref 4.0–10.3)
lymph#: 0.2 10*3/uL — ABNORMAL LOW (ref 0.9–3.3)

## 2014-03-20 LAB — HOLD TUBE, BLOOD BANK

## 2014-03-20 MED ORDER — HEPARIN SOD (PORK) LOCK FLUSH 100 UNIT/ML IV SOLN
500.0000 [IU] | Freq: Every day | INTRAVENOUS | Status: AC | PRN
  Administered 2014-03-20: 500 [IU]
  Filled 2014-03-20: qty 5

## 2014-03-20 MED ORDER — SODIUM CHLORIDE 0.9 % IV SOLN
250.0000 mL | Freq: Once | INTRAVENOUS | Status: AC
  Administered 2014-03-20: 250 mL via INTRAVENOUS

## 2014-03-20 MED ORDER — SODIUM CHLORIDE 0.9 % IJ SOLN
10.0000 mL | INTRAMUSCULAR | Status: AC | PRN
  Administered 2014-03-20: 10 mL
  Filled 2014-03-20: qty 10

## 2014-03-20 MED ORDER — FUROSEMIDE 10 MG/ML IJ SOLN
INTRAMUSCULAR | Status: AC
  Filled 2014-03-20: qty 2

## 2014-03-20 MED ORDER — FUROSEMIDE 10 MG/ML IJ SOLN
20.0000 mg | Freq: Once | INTRAMUSCULAR | Status: AC
  Administered 2014-03-20: 20 mg via INTRAVENOUS

## 2014-03-20 NOTE — Patient Instructions (Signed)
Platelet Transfusion Information This is information about transfusions of platelets. Platelets are tiny cells made by the bone marrow and found in the blood. When a blood vessel is damaged, platelets rush to the damaged area to help form a clot. This begins the healing process. When platelets get very low, your blood may have trouble clotting. This may be from:  Illness.  Blood disorder.  Chemotherapy to treat cancer. Often, lower platelet counts do not cause problems.  Platelets usually last for 7 to 10 days. If they are not used in an injury, they are broken down by the liver or spleen. Symptoms of low platelet count include:  Nosebleeds.  Bleeding gums.  Heavy periods.  Bruising and tiny blood spots in the skin.  Pinpoint spots of bleeding (petechiae).  Larger bruises (purpura).  Bleeding can be more serious if it happens in the brain or bowel. Platelet transfusions are often used to keep the platelet count at an acceptable level. Serious bleeding due to low platelets is uncommon. RISKS AND COMPLICATIONS Severe side effects from platelet transfusions are uncommon. Minor reactions may include:  Itching.  Rashes.  High temperature and shivering. Medications are available to stop transfusion reactions. Let your health care provider know if you develop any of the above problems.  If you are having platelet transfusions frequently, they may get less effective. This is called becoming refractory to platelets. It is uncommon. This can happen from non-immune causes and immune causes. Non-immune causes include:  High temperatures.  Some medications.  An enlarged spleen. Immune causes happen when your body discovers the platelets are not your own and begins making antibodies against them. The antibodies kill the platelets quickly. Even with platelet transfusions, you may still notice problems with bleeding or bruising. Let your health care providers know about this. Other things  can be done to help if this happens.  BEFORE THE PROCEDURE   Your health care provider will check your platelet count regularly.  If the platelet count is too low, it may be necessary to have a platelet transfusion.  This is more important before certain procedures with a risk of bleeding, such as a spinal tap.  Platelet transfusion reduces the risk of bleeding during or after the procedure.  Except in emergencies, giving a transfusion requires a written consent. Before blood is taken from a donor, a complete history is taken to make sure the person has no history of previous diseases, nor engages in risky social behavior. Examples of this are intravenous drug use or sexual activity with multiple partners. This could lead to infected blood or blood products being used. This history is taken in spite of the extensive testing to make sure the blood is safe. All blood products transfused are tested to make sure it is a match for the person getting the blood. It is also checked for infections. Blood is the safest it has ever been. The risk of getting an infection is very low. PROCEDURE  The platelets are stored in small plastic bags that are kept at a low temperature.  Each bag is called a unit and sometimes two units are given. They are given through an intravenous line by drip infusion over about one-half hour.  Usually blood is collected from multiple people to get enough to transfuse.  Sometimes, the platelets are collected from a single person. This is done using a special machine that separates the platelets from the blood. The machine is called an apheresis machine. Platelets collected in this   way are called apheresed platelets. Apheresed platelets reduce the risk of becoming sensitive to the platelets. This lowers the chances of having a transfusion reaction.  As it only takes a short time to give the platelets, this treatment can be given in an outpatient department. Platelets can also be  given before or after other treatments. SEEK IMMEDIATE MEDICAL CARE IF: You have any of the following symptoms over the next 12 hours or several days:  Shaking chills.  Fever with a temperature greater than 102F (38.9C) develops.  Back pain or muscle pain.  People around you feel you are not acting correctly, or you are confused.  Blood in the urine or bowel movements, or bleeding from any place in your body.  Shortness of breath, or difficulty breathing.  Dizziness.  Fainting.  You break out in a rash or develop hives.  Decrease in the amount of urine you are putting out, or the urine turns a dark color or changes to pink, red, or brown.  A severe headache or stiff neck.  Bruising more easily. Document Released: 11/27/2006 Document Revised: 06/16/2013 Document Reviewed: 11/27/2006 ExitCare Patient Information 2015 [employer]. This information is not intended to replace advice given to you by your health care provider. Make sure you discuss any questions you have with your health care provider.   Blood Transfusion Information WHAT IS A BLOOD TRANSFUSION? A transfusion is the replacement of blood or some of its parts. Blood is made up of multiple cells which provide different functions.  Red blood cells carry oxygen and are used for blood loss replacement.  White blood cells fight against infection.  Platelets control bleeding.  Plasma helps clot blood.  Other blood products are available for specialized needs, such as hemophilia or other clotting disorders. BEFORE THE TRANSFUSION  Who gives blood for transfusions?   You may be able to donate blood to be used at a later date on yourself (autologous donation).  Relatives can be asked to donate blood. This is generally not any safer than if you have received blood from a stranger. The same precautions are taken to ensure safety when a relative's blood is donated.  Healthy volunteers who are fully evaluated to  make sure their blood is safe. This is blood bank blood. Transfusion therapy is the safest it has ever been in the practice of medicine. Before blood is taken from a donor, a complete history is taken to make sure that person has no history of diseases nor engages in risky social behavior (examples are intravenous drug use or sexual activity with multiple partners). The donor's travel history is screened to minimize risk of transmitting infections, such as malaria. The donated blood is tested for signs of infectious diseases, such as HIV and hepatitis. The blood is then tested to be sure it is compatible with you in order to minimize the chance of a transfusion reaction. If you or a relative donates blood, this is often done in anticipation of surgery and is not appropriate for emergency situations. It takes many days to process the donated blood. RISKS AND COMPLICATIONS Although transfusion therapy is very safe and saves many lives, the main dangers of transfusion include:   Getting an infectious disease.  Developing a transfusion reaction. This is an allergic reaction to something in the blood you were given. Every precaution is taken to prevent this. The decision to have a blood transfusion has been considered carefully by your caregiver before blood is given. Blood is not given   unless the benefits outweigh the risks. AFTER THE TRANSFUSION  Right after receiving a blood transfusion, you will usually feel much better and more energetic. This is especially true if your red blood cells have gotten low (anemic). The transfusion raises the level of the red blood cells which carry oxygen, and this usually causes an energy increase.  The nurse administering the transfusion will monitor you carefully for complications. HOME CARE INSTRUCTIONS  No special instructions are needed after a transfusion. You may find your energy is better. Speak with your caregiver about any limitations on activity for underlying  diseases you may have. SEEK MEDICAL CARE IF:   Your condition is not improving after your transfusion.  You develop redness or irritation at the intravenous (IV) site. SEEK IMMEDIATE MEDICAL CARE IF:  Any of the following symptoms occur over the next 12 hours:  Shaking chills.  You have a temperature by mouth above 102 F (38.9 C), not controlled by medicine.  Chest, back, or muscle pain.  People around you feel you are not acting correctly or are confused.  Shortness of breath or difficulty breathing.  Dizziness and fainting.  You get a rash or develop hives.  You have a decrease in urine output.  Your urine turns a dark color or changes to pink, red, or brown. Any of the following symptoms occur over the next 10 days:  You have a temperature by mouth above 102 F (38.9 C), not controlled by medicine.  Shortness of breath.  Weakness after normal activity.  The white part of the eye turns yellow (jaundice).  You have a decrease in the amount of urine or are urinating less often.  Your urine turns a dark color or changes to pink, red, or brown. Document Released: 01/28/2000 Document Revised: 04/24/2011 Document Reviewed: 09/16/2007 ExitCare Patient Information 2015 [employer]. This information is not intended to replace advice given to you by your health care provider. Make sure you discuss any questions you have with your health care provider.  

## 2014-03-21 LAB — TYPE AND SCREEN
ABO/RH(D): A POS
Antibody Screen: NEGATIVE
UNIT DIVISION: 0
Unit division: 0

## 2014-03-21 LAB — PREPARE PLATELET PHERESIS: Unit division: 0

## 2014-03-23 ENCOUNTER — Other Ambulatory Visit: Payer: Self-pay | Admitting: *Deleted

## 2014-03-23 ENCOUNTER — Other Ambulatory Visit (HOSPITAL_BASED_OUTPATIENT_CLINIC_OR_DEPARTMENT_OTHER): Payer: Managed Care, Other (non HMO)

## 2014-03-23 ENCOUNTER — Telehealth: Payer: Self-pay | Admitting: *Deleted

## 2014-03-23 ENCOUNTER — Ambulatory Visit (HOSPITAL_COMMUNITY)
Admission: RE | Admit: 2014-03-23 | Discharge: 2014-03-23 | Disposition: A | Discharge status: Home / Self Care | Payer: Managed Care, Other (non HMO) | Source: Ambulatory Visit

## 2014-03-23 ENCOUNTER — Ambulatory Visit (HOSPITAL_COMMUNITY): Admission: RE | Admit: 2014-03-23 | Payer: Managed Care, Other (non HMO) | Source: Ambulatory Visit

## 2014-03-23 VITALS — BP 123/64 | HR 79 | Temp 98.4°F | Resp 18

## 2014-03-23 DIAGNOSIS — C9200 Acute myeloblastic leukemia, not having achieved remission: Secondary | ICD-10-CM

## 2014-03-23 DIAGNOSIS — D630 Anemia in neoplastic disease: Secondary | ICD-10-CM

## 2014-03-23 DIAGNOSIS — C9201 Acute myeloblastic leukemia, in remission: Secondary | ICD-10-CM

## 2014-03-23 LAB — CBC WITH DIFFERENTIAL/PLATELET
BASO%: 0 % (ref 0.0–2.0)
Basophils Absolute: 0 10*3/uL (ref 0.0–0.1)
EOS%: 2.4 % (ref 0.0–7.0)
Eosinophils Absolute: 0 10*3/uL (ref 0.0–0.5)
HCT: 29.4 % — ABNORMAL LOW (ref 38.4–49.9)
HGB: 10 g/dL — ABNORMAL LOW (ref 13.0–17.1)
LYMPH%: 65.9 % — AB (ref 14.0–49.0)
MCH: 31 pg (ref 27.2–33.4)
MCHC: 34 g/dL (ref 32.0–36.0)
MCV: 91 fL (ref 79.3–98.0)
MONO#: 0 10*3/uL — ABNORMAL LOW (ref 0.1–0.9)
MONO%: 7.3 % (ref 0.0–14.0)
NEUT#: 0.1 10*3/uL — CL (ref 1.5–6.5)
NEUT%: 24.4 % — ABNORMAL LOW (ref 39.0–75.0)
NRBC: 0 % (ref 0–0)
Platelets: 7 10*3/uL — CL (ref 140–400)
RBC: 3.23 10*6/uL — ABNORMAL LOW (ref 4.20–5.82)
RDW: 17.4 % — AB (ref 11.0–14.6)
WBC: 0.4 10*3/uL — CL (ref 4.0–10.3)
lymph#: 0.3 10*3/uL — ABNORMAL LOW (ref 0.9–3.3)

## 2014-03-23 LAB — COMPREHENSIVE METABOLIC PANEL (CC13)
ALT: 10 U/L (ref 0–55)
AST: 9 U/L (ref 5–34)
Albumin: 3.2 g/dL — ABNORMAL LOW (ref 3.5–5.0)
Alkaline Phosphatase: 101 U/L (ref 40–150)
Anion Gap: 11 mEq/L (ref 3–11)
BILIRUBIN TOTAL: 0.47 mg/dL (ref 0.20–1.20)
BUN: 18.3 mg/dL (ref 7.0–26.0)
CO2: 25 mEq/L (ref 22–29)
Calcium: 9.7 mg/dL (ref 8.4–10.4)
Chloride: 106 mEq/L (ref 98–109)
Creatinine: 1 mg/dL (ref 0.7–1.3)
EGFR: 74 mL/min/{1.73_m2} — AB (ref >90)
Glucose: 169 mg/dl — ABNORMAL HIGH (ref 70–140)
Potassium: 4.1 mEq/L (ref 3.5–5.1)
Sodium: 142 mEq/L (ref 136–145)
Total Protein: 6.9 g/dL (ref 6.4–8.3)

## 2014-03-23 LAB — MAGNESIUM (CC13): MAGNESIUM: 2 mg/dL (ref 1.5–2.5)

## 2014-03-23 LAB — HOLD TUBE, BLOOD BANK

## 2014-03-23 MED ORDER — HEPARIN SOD (PORK) LOCK FLUSH 100 UNIT/ML IV SOLN
500.0000 [IU] | Freq: Every day | INTRAVENOUS | Status: AC | PRN
  Administered 2014-03-23: 500 [IU]
  Filled 2014-03-23: qty 5

## 2014-03-23 MED ORDER — SODIUM CHLORIDE 0.9 % IV SOLN: 250.0000 mL | Freq: Once | INTRAVENOUS | Status: DC

## 2014-03-23 MED ORDER — SODIUM CHLORIDE 0.9 % IJ SOLN: 3.0000 mL | INTRAMUSCULAR | Status: DC | PRN

## 2014-03-23 MED ORDER — SODIUM CHLORIDE 0.9 % IJ SOLN
10.0000 mL | INTRAMUSCULAR | Status: AC | PRN
  Administered 2014-03-23: 10 mL

## 2014-03-23 MED ORDER — HEPARIN SOD (PORK) LOCK FLUSH 100 UNIT/ML IV SOLN: 250.0000 [IU] | INTRAVENOUS | Status: DC | PRN

## 2014-03-23 NOTE — Telephone Encounter (Signed)
Patient called reporting he missed a call from our office.  No documentation of a call to patient at this time.  Says he was here and is to go to [facility] for blood transfusion.  "If it is important, they'll call me back.

## 2014-03-23 NOTE — Procedures (Signed)
[facility]  Procedure Note  David Carr. [id number]:[id number] DOB: [birth year] DOA: 03/23/2014   Dr. Alvy Bimler  Associated Diagnosis: AML  Procedure Note: port accessed, blood returned, Platelets transfused per order, port flushed and de-accessed   Condition During Procedure:  Patient stable throughout procedure   Condition at Discharge: patient stable, ambulatory at discharge.  Patient instructed on the importance of keeping blood band on until return on Wednesday for blood transfusion.  Patient verbalizes understanding.   Roberto Scales, RN  [facility]

## 2014-03-24 ENCOUNTER — Telehealth: Payer: Self-pay | Admitting: Hematology and Oncology

## 2014-03-24 LAB — PREPARE PLATELET PHERESIS: Unit division: 0

## 2014-03-24 NOTE — Telephone Encounter (Signed)
returned call and s.w. pt and r/s lab

## 2014-03-25 ENCOUNTER — Other Ambulatory Visit (HOSPITAL_BASED_OUTPATIENT_CLINIC_OR_DEPARTMENT_OTHER): Payer: Managed Care, Other (non HMO)

## 2014-03-25 ENCOUNTER — Other Ambulatory Visit: Payer: Managed Care, Other (non HMO)

## 2014-03-25 ENCOUNTER — Other Ambulatory Visit: Payer: Self-pay | Admitting: *Deleted

## 2014-03-25 ENCOUNTER — Encounter: Payer: Self-pay | Admitting: *Deleted

## 2014-03-25 ENCOUNTER — Ambulatory Visit (HOSPITAL_COMMUNITY)
Admission: RE | Admit: 2014-03-25 | Discharge: 2014-03-25 | Disposition: A | Discharge status: Home / Self Care | Payer: Managed Care, Other (non HMO) | Source: Ambulatory Visit | Attending: Hematology and Oncology | Admitting: Hematology and Oncology

## 2014-03-25 VITALS — BP 124/81 | HR 77 | Temp 97.8°F | Resp 18

## 2014-03-25 DIAGNOSIS — D630 Anemia in neoplastic disease: Secondary | ICD-10-CM

## 2014-03-25 DIAGNOSIS — C9200 Acute myeloblastic leukemia, not having achieved remission: Secondary | ICD-10-CM

## 2014-03-25 DIAGNOSIS — C9201 Acute myeloblastic leukemia, in remission: Secondary | ICD-10-CM

## 2014-03-25 LAB — CBC WITH DIFFERENTIAL/PLATELET
BASO%: 0 % (ref 0.0–2.0)
BASOS ABS: 0 10*3/uL (ref 0.0–0.1)
EOS ABS: 0 10*3/uL (ref 0.0–0.5)
EOS%: 0.7 % (ref 0.0–7.0)
HEMATOCRIT: 29.6 % — AB (ref 38.4–49.9)
HEMOGLOBIN: 10.1 g/dL — AB (ref 13.0–17.1)
LYMPH%: 21.2 % (ref 14.0–49.0)
MCH: 31.1 pg (ref 27.2–33.4)
MCHC: 34.1 g/dL (ref 32.0–36.0)
MCV: 91.1 fL (ref 79.3–98.0)
MONO#: 0.2 10*3/uL (ref 0.1–0.9)
MONO%: 13.2 % (ref 0.0–14.0)
NEUT#: 1 10*3/uL — ABNORMAL LOW (ref 1.5–6.5)
NEUT%: 64.9 % (ref 39.0–75.0)
PLATELETS: 18 10*3/uL — AB (ref 140–400)
RBC: 3.25 10*6/uL — ABNORMAL LOW (ref 4.20–5.82)
RDW: 17.2 % — ABNORMAL HIGH (ref 11.0–14.6)
WBC: 1.5 10*3/uL — AB (ref 4.0–10.3)
lymph#: 0.3 10*3/uL — ABNORMAL LOW (ref 0.9–3.3)

## 2014-03-25 LAB — HOLD TUBE, BLOOD BANK

## 2014-03-25 MED ORDER — HEPARIN SOD (PORK) LOCK FLUSH 100 UNIT/ML IV SOLN
500.0000 [IU] | Freq: Every day | INTRAVENOUS | Status: AC | PRN
  Administered 2014-03-25: 500 [IU]
  Filled 2014-03-25: qty 5

## 2014-03-25 MED ORDER — SODIUM CHLORIDE 0.9 % IJ SOLN
10.0000 mL | INTRAMUSCULAR | Status: AC | PRN
  Administered 2014-03-25: 10 mL

## 2014-03-25 MED ORDER — SODIUM CHLORIDE 0.9 % IV SOLN
250.0000 mL | Freq: Once | INTRAVENOUS | Status: AC
  Administered 2014-03-25: 250 mL via INTRAVENOUS

## 2014-03-25 NOTE — Progress Notes (Signed)
Informed pt of order for Platelet transfusion per [facility] parameters to transfuse for less than 20,000.  His platelet count today is 18.  Pt does not need any blood today.  He verbalized understanding and has appt for transfusion at [facility] today.  He walking over there from our lobby.   Pt to return on Friday for labs as scheduled.   Faxed CBC to [facility] at fax [phone].

## 2014-03-25 NOTE — Procedures (Signed)
[facility]  Procedure Note  David Carr. UVO:[id number] DOB: [birth year] DOA: 03/25/2014   PCP: Joycelyn Man, MD   Associated Diagnosis: AML (acute myeloblastic leukemia)  Procedure Note: Infusion of 1 unit platelets   Condition During Procedure: pt tolerated well   Condition at Discharge:  Pt alert, oriented, ambulatory; no complications noted   Tamala Julian, Salley Slaughter, [facility]

## 2014-03-26 LAB — PREPARE PLATELET PHERESIS: UNIT DIVISION: 0

## 2014-03-27 ENCOUNTER — Encounter: Payer: Self-pay | Admitting: *Deleted

## 2014-03-27 ENCOUNTER — Other Ambulatory Visit (HOSPITAL_BASED_OUTPATIENT_CLINIC_OR_DEPARTMENT_OTHER): Payer: Managed Care, Other (non HMO)

## 2014-03-27 DIAGNOSIS — D630 Anemia in neoplastic disease: Secondary | ICD-10-CM

## 2014-03-27 DIAGNOSIS — C9201 Acute myeloblastic leukemia, in remission: Secondary | ICD-10-CM

## 2014-03-27 LAB — CBC WITH DIFFERENTIAL/PLATELET
BASO%: 0.3 % (ref 0.0–2.0)
Basophils Absolute: 0 10*3/uL (ref 0.0–0.1)
EOS%: 0.1 % (ref 0.0–7.0)
Eosinophils Absolute: 0 10*3/uL (ref 0.0–0.5)
HCT: 28.9 % — ABNORMAL LOW (ref 38.4–49.9)
HGB: 9.3 g/dL — ABNORMAL LOW (ref 13.0–17.1)
LYMPH%: 17.5 % (ref 14.0–49.0)
MCH: 29.9 pg (ref 27.2–33.4)
MCHC: 32.3 g/dL (ref 32.0–36.0)
MCV: 92.6 fL (ref 79.3–98.0)
MONO#: 0.5 10*3/uL (ref 0.1–0.9)
MONO%: 17.9 % — ABNORMAL HIGH (ref 0.0–14.0)
NEUT#: 1.9 10*3/uL (ref 1.5–6.5)
NEUT%: 64.2 % (ref 39.0–75.0)
PLATELETS: 34 10*3/uL — AB (ref 140–400)
RBC: 3.12 10*6/uL — ABNORMAL LOW (ref 4.20–5.82)
RDW: 19.7 % — ABNORMAL HIGH (ref 11.0–14.6)
WBC: 3 10*3/uL — AB (ref 4.0–10.3)
lymph#: 0.5 10*3/uL — ABNORMAL LOW (ref 0.9–3.3)

## 2014-03-27 LAB — HOLD TUBE, BLOOD BANK

## 2014-03-27 NOTE — Progress Notes (Signed)
S/w pt in lobby and gave him copy of CBC.  Informed him no need for any transfusions today and to keep appt on Monday 2/15 as scheduled. Pt verbalized understanding.  Faxed results to [facility] at fax [phone].

## 2014-03-30 ENCOUNTER — Encounter: Payer: Self-pay | Admitting: *Deleted

## 2014-03-30 ENCOUNTER — Other Ambulatory Visit (HOSPITAL_BASED_OUTPATIENT_CLINIC_OR_DEPARTMENT_OTHER): Payer: Managed Care, Other (non HMO)

## 2014-03-30 ENCOUNTER — Ambulatory Visit: Payer: Managed Care, Other (non HMO)

## 2014-03-30 DIAGNOSIS — D630 Anemia in neoplastic disease: Secondary | ICD-10-CM

## 2014-03-30 DIAGNOSIS — C9201 Acute myeloblastic leukemia, in remission: Secondary | ICD-10-CM

## 2014-03-30 LAB — COMPREHENSIVE METABOLIC PANEL (CC13)
ALT: 16 U/L (ref 0–55)
AST: 16 U/L (ref 5–34)
Albumin: 3.3 g/dL — ABNORMAL LOW (ref 3.5–5.0)
Alkaline Phosphatase: 104 U/L (ref 40–150)
Anion Gap: 10 mEq/L (ref 3–11)
BUN: 14 mg/dL (ref 7.0–26.0)
CHLORIDE: 105 meq/L (ref 98–109)
CO2: 26 mEq/L (ref 22–29)
Calcium: 9.4 mg/dL (ref 8.4–10.4)
Creatinine: 1 mg/dL (ref 0.7–1.3)
EGFR: 77 mL/min/{1.73_m2} — AB (ref >90)
Glucose: 263 mg/dl — ABNORMAL HIGH (ref 70–140)
Potassium: 4.2 mEq/L (ref 3.5–5.1)
Sodium: 141 mEq/L (ref 136–145)
Total Bilirubin: 0.25 mg/dL (ref 0.20–1.20)
Total Protein: 6.7 g/dL (ref 6.4–8.3)

## 2014-03-30 LAB — CBC WITH DIFFERENTIAL/PLATELET
BASO%: 0 % (ref 0.0–2.0)
Basophils Absolute: 0 10*3/uL (ref 0.0–0.1)
EOS%: 0 % (ref 0.0–7.0)
Eosinophils Absolute: 0 10*3/uL (ref 0.0–0.5)
HEMATOCRIT: 26.6 % — AB (ref 38.4–49.9)
HEMOGLOBIN: 9.2 g/dL — AB (ref 13.0–17.1)
LYMPH%: 17.4 % (ref 14.0–49.0)
MCH: 31.6 pg (ref 27.2–33.4)
MCHC: 34.6 g/dL (ref 32.0–36.0)
MCV: 91.4 fL (ref 79.3–98.0)
MONO#: 0.5 10*3/uL (ref 0.1–0.9)
MONO%: 16.5 % — AB (ref 0.0–14.0)
NEUT#: 2.2 10*3/uL (ref 1.5–6.5)
NEUT%: 66.1 % (ref 39.0–75.0)
Platelets: 27 10*3/uL — ABNORMAL LOW (ref 140–400)
RBC: 2.91 10*6/uL — ABNORMAL LOW (ref 4.20–5.82)
RDW: 16.9 % — ABNORMAL HIGH (ref 11.0–14.6)
WBC: 3.3 10*3/uL — ABNORMAL LOW (ref 4.0–10.3)
lymph#: 0.6 10*3/uL — ABNORMAL LOW (ref 0.9–3.3)

## 2014-03-30 LAB — MAGNESIUM (CC13): MAGNESIUM: 2.1 mg/dL (ref 1.5–2.5)

## 2014-03-30 LAB — HOLD TUBE, BLOOD BANK

## 2014-03-30 NOTE — Progress Notes (Signed)
Faxed lab results to [facility] at fax [phone].  No transfusions needed today.  Pt was sent home.

## 2014-03-30 NOTE — Progress Notes (Signed)
Per Dr.Gorsuch/[facility] Guidelines pt does not meet requirements for blood transfusion of HGB less then 9. Pt's HGB 9.2, ANC 2.2 PLTS 27. Pt aware of results and will return to clinic 04/01/14 as scheduled for labs and possible transfusion.

## 2014-03-31 ENCOUNTER — Telehealth: Payer: Self-pay | Admitting: *Deleted

## 2014-03-31 NOTE — Telephone Encounter (Signed)
Opened note in error.

## 2014-04-01 ENCOUNTER — Encounter: Payer: Self-pay | Admitting: *Deleted

## 2014-04-01 ENCOUNTER — Other Ambulatory Visit (HOSPITAL_BASED_OUTPATIENT_CLINIC_OR_DEPARTMENT_OTHER): Payer: Managed Care, Other (non HMO)

## 2014-04-01 ENCOUNTER — Other Ambulatory Visit: Payer: Managed Care, Other (non HMO)

## 2014-04-01 DIAGNOSIS — D630 Anemia in neoplastic disease: Secondary | ICD-10-CM

## 2014-04-01 DIAGNOSIS — C9201 Acute myeloblastic leukemia, in remission: Secondary | ICD-10-CM

## 2014-04-01 LAB — CBC WITH DIFFERENTIAL/PLATELET
BASO%: 0 % (ref 0.0–2.0)
Basophils Absolute: 0 10*3/uL (ref 0.0–0.1)
EOS%: 0 % (ref 0.0–7.0)
Eosinophils Absolute: 0 10*3/uL (ref 0.0–0.5)
HEMATOCRIT: 26.7 % — AB (ref 38.4–49.9)
HEMOGLOBIN: 9.1 g/dL — AB (ref 13.0–17.1)
LYMPH#: 0.6 10*3/uL — AB (ref 0.9–3.3)
LYMPH%: 15.9 % (ref 14.0–49.0)
MCH: 30.8 pg (ref 27.2–33.4)
MCHC: 34.1 g/dL (ref 32.0–36.0)
MCV: 90.5 fL (ref 79.3–98.0)
MONO#: 0.7 10*3/uL (ref 0.1–0.9)
MONO%: 18.6 % — ABNORMAL HIGH (ref 0.0–14.0)
NEUT#: 2.5 10*3/uL (ref 1.5–6.5)
NEUT%: 65.5 % (ref 39.0–75.0)
Platelets: 32 10*3/uL — ABNORMAL LOW (ref 140–400)
RBC: 2.95 10*6/uL — ABNORMAL LOW (ref 4.20–5.82)
RDW: 16.9 % — AB (ref 11.0–14.6)
WBC: 3.8 10*3/uL — ABNORMAL LOW (ref 4.0–10.3)

## 2014-04-01 LAB — HOLD TUBE, BLOOD BANK

## 2014-04-01 NOTE — Progress Notes (Unsigned)
Informed pt of CBC results and no need for transfusion today.  He reports feeling very tired, dizzy w/ standing at times and occasional chest tightness.  He says he really felt like he was going to need a transfusion today.  Pt states he feels he is drinking plenty of fluids and he is urinating well.  He says his BP has been good.  Discussed w/ pt he likely experiencing fatigue as side effect from chemotherapy.  He needs to call 911 if he has any chest pains or tightness that last more than minute w/ rest.   Encouraged him to rest at home, drink plenty of fluid and call if he has any worsening symptoms.  Return Friday as scheduled for lab as scheduled.

## 2014-04-03 ENCOUNTER — Ambulatory Visit (HOSPITAL_BASED_OUTPATIENT_CLINIC_OR_DEPARTMENT_OTHER): Payer: Managed Care, Other (non HMO)

## 2014-04-03 ENCOUNTER — Encounter: Payer: Self-pay | Admitting: *Deleted

## 2014-04-03 ENCOUNTER — Other Ambulatory Visit: Payer: Self-pay | Admitting: Hematology and Oncology

## 2014-04-03 ENCOUNTER — Other Ambulatory Visit (HOSPITAL_BASED_OUTPATIENT_CLINIC_OR_DEPARTMENT_OTHER): Payer: Managed Care, Other (non HMO)

## 2014-04-03 VITALS — BP 140/86 | HR 78 | Temp 98.4°F | Resp 18

## 2014-04-03 DIAGNOSIS — C9200 Acute myeloblastic leukemia, not having achieved remission: Secondary | ICD-10-CM

## 2014-04-03 DIAGNOSIS — D630 Anemia in neoplastic disease: Secondary | ICD-10-CM

## 2014-04-03 DIAGNOSIS — C9201 Acute myeloblastic leukemia, in remission: Secondary | ICD-10-CM

## 2014-04-03 LAB — CBC WITH DIFFERENTIAL/PLATELET
BASO%: 0.3 % (ref 0.0–2.0)
Basophils Absolute: 0 10*3/uL (ref 0.0–0.1)
EOS ABS: 0 10*3/uL (ref 0.0–0.5)
EOS%: 0 % (ref 0.0–7.0)
HCT: 25.3 % — ABNORMAL LOW (ref 38.4–49.9)
HGB: 8.6 g/dL — ABNORMAL LOW (ref 13.0–17.1)
LYMPH%: 16.9 % (ref 14.0–49.0)
MCH: 31 pg (ref 27.2–33.4)
MCHC: 34 g/dL (ref 32.0–36.0)
MCV: 91.3 fL (ref 79.3–98.0)
MONO#: 0.7 10*3/uL (ref 0.1–0.9)
MONO%: 19.4 % — AB (ref 0.0–14.0)
NEUT%: 63.4 % (ref 39.0–75.0)
NEUTROS ABS: 2.2 10*3/uL (ref 1.5–6.5)
PLATELETS: 51 10*3/uL — AB (ref 140–400)
RBC: 2.77 10*6/uL — AB (ref 4.20–5.82)
RDW: 17.1 % — AB (ref 11.0–14.6)
WBC: 3.5 10*3/uL — ABNORMAL LOW (ref 4.0–10.3)
lymph#: 0.6 10*3/uL — ABNORMAL LOW (ref 0.9–3.3)

## 2014-04-03 LAB — HOLD TUBE, BLOOD BANK

## 2014-04-03 MED ORDER — DIPHENHYDRAMINE HCL 25 MG PO CAPS
ORAL_CAPSULE | ORAL | Status: AC
  Filled 2014-04-03: qty 1

## 2014-04-03 MED ORDER — DIPHENHYDRAMINE HCL 25 MG PO CAPS
25.0000 mg | ORAL_CAPSULE | Freq: Once | ORAL | Status: AC
  Administered 2014-04-03: 25 mg via ORAL

## 2014-04-03 MED ORDER — FUROSEMIDE 10 MG/ML IJ SOLN: 20.0000 mg | Freq: Once | INTRAMUSCULAR | Status: DC

## 2014-04-03 MED ORDER — ACETAMINOPHEN 325 MG PO TABS
ORAL_TABLET | ORAL | Status: AC
  Filled 2014-04-03: qty 2

## 2014-04-03 MED ORDER — SODIUM CHLORIDE 0.9 % IV SOLN
250.0000 mL | Freq: Once | INTRAVENOUS | Status: AC
  Administered 2014-04-03: 250 mL via INTRAVENOUS

## 2014-04-03 MED ORDER — HEPARIN SOD (PORK) LOCK FLUSH 100 UNIT/ML IV SOLN
500.0000 [IU] | Freq: Every day | INTRAVENOUS | Status: DC | PRN
  Filled 2014-04-03: qty 5

## 2014-04-03 MED ORDER — ACETAMINOPHEN 325 MG PO TABS
650.0000 mg | ORAL_TABLET | Freq: Once | ORAL | Status: AC
  Administered 2014-04-03: 650 mg via ORAL

## 2014-04-03 MED ORDER — SODIUM CHLORIDE 0.9 % IJ SOLN
10.0000 mL | INTRAMUSCULAR | Status: DC | PRN
  Filled 2014-04-03: qty 10

## 2014-04-03 NOTE — Patient Instructions (Signed)

## 2014-04-03 NOTE — Progress Notes (Signed)
Labs faxed to Dr. Nadara Mustard at [facility],  [phone].

## 2014-04-05 LAB — TYPE AND SCREEN
ABO/RH(D): A POS
Antibody Screen: NEGATIVE
UNIT DIVISION: 0
Unit division: 0

## 2014-04-06 ENCOUNTER — Other Ambulatory Visit (HOSPITAL_BASED_OUTPATIENT_CLINIC_OR_DEPARTMENT_OTHER): Payer: Managed Care, Other (non HMO)

## 2014-04-06 DIAGNOSIS — C9201 Acute myeloblastic leukemia, in remission: Secondary | ICD-10-CM

## 2014-04-06 DIAGNOSIS — D630 Anemia in neoplastic disease: Secondary | ICD-10-CM

## 2014-04-06 LAB — CBC WITH DIFFERENTIAL/PLATELET
BASO%: 0 % (ref 0.0–2.0)
Basophils Absolute: 0 10*3/uL (ref 0.0–0.1)
EOS ABS: 0 10*3/uL (ref 0.0–0.5)
EOS%: 0 % (ref 0.0–7.0)
HEMATOCRIT: 34.1 % — AB (ref 38.4–49.9)
HGB: 11.6 g/dL — ABNORMAL LOW (ref 13.0–17.1)
LYMPH#: 0.6 10*3/uL — AB (ref 0.9–3.3)
LYMPH%: 19.7 % (ref 14.0–49.0)
MCH: 31 pg (ref 27.2–33.4)
MCHC: 34 g/dL (ref 32.0–36.0)
MCV: 91.2 fL (ref 79.3–98.0)
MONO#: 0.6 10*3/uL (ref 0.1–0.9)
MONO%: 17.5 % — AB (ref 0.0–14.0)
NEUT#: 2 10*3/uL (ref 1.5–6.5)
NEUT%: 62.8 % (ref 39.0–75.0)
Platelets: 65 10*3/uL — ABNORMAL LOW (ref 140–400)
RBC: 3.74 10*6/uL — ABNORMAL LOW (ref 4.20–5.82)
RDW: 16.3 % — ABNORMAL HIGH (ref 11.0–14.6)
WBC: 3.2 10*3/uL — ABNORMAL LOW (ref 4.0–10.3)

## 2014-04-06 LAB — COMPREHENSIVE METABOLIC PANEL (CC13)
ALBUMIN: 3.4 g/dL — AB (ref 3.5–5.0)
ALK PHOS: 85 U/L (ref 40–150)
ALT: 18 U/L (ref 0–55)
ANION GAP: 12 meq/L — AB (ref 3–11)
AST: 22 U/L (ref 5–34)
BUN: 12.8 mg/dL (ref 7.0–26.0)
CO2: 23 meq/L (ref 22–29)
Calcium: 9.3 mg/dL (ref 8.4–10.4)
Chloride: 106 mEq/L (ref 98–109)
Creatinine: 0.9 mg/dL (ref 0.7–1.3)
EGFR: 87 mL/min/{1.73_m2} — AB (ref >90)
Glucose: 160 mg/dl — ABNORMAL HIGH (ref 70–140)
Potassium: 4 mEq/L (ref 3.5–5.1)
SODIUM: 141 meq/L (ref 136–145)
TOTAL PROTEIN: 6.7 g/dL (ref 6.4–8.3)
Total Bilirubin: 0.49 mg/dL (ref 0.20–1.20)

## 2014-04-06 LAB — MAGNESIUM (CC13): Magnesium: 2.1 mg/dl (ref 1.5–2.5)

## 2014-04-06 LAB — HOLD TUBE, BLOOD BANK

## 2014-04-08 ENCOUNTER — Other Ambulatory Visit: Payer: Managed Care, Other (non HMO)

## 2014-04-08 ENCOUNTER — Other Ambulatory Visit (HOSPITAL_BASED_OUTPATIENT_CLINIC_OR_DEPARTMENT_OTHER): Payer: Managed Care, Other (non HMO)

## 2014-04-08 DIAGNOSIS — D630 Anemia in neoplastic disease: Secondary | ICD-10-CM

## 2014-04-08 DIAGNOSIS — C9201 Acute myeloblastic leukemia, in remission: Secondary | ICD-10-CM

## 2014-04-08 DIAGNOSIS — C9200 Acute myeloblastic leukemia, not having achieved remission: Secondary | ICD-10-CM

## 2014-04-08 LAB — CBC WITH DIFFERENTIAL/PLATELET
BASO%: 0.5 % (ref 0.0–2.0)
BASOS ABS: 0 10*3/uL (ref 0.0–0.1)
EOS%: 0 % (ref 0.0–7.0)
Eosinophils Absolute: 0 10*3/uL (ref 0.0–0.5)
HCT: 34.2 % — ABNORMAL LOW (ref 38.4–49.9)
HEMOGLOBIN: 11.8 g/dL — AB (ref 13.0–17.1)
LYMPH#: 1.1 10*3/uL (ref 0.9–3.3)
LYMPH%: 29.2 % (ref 14.0–49.0)
MCH: 31.7 pg (ref 27.2–33.4)
MCHC: 34.5 g/dL (ref 32.0–36.0)
MCV: 91.9 fL (ref 79.3–98.0)
MONO#: 0.6 10*3/uL (ref 0.1–0.9)
MONO%: 15.8 % — ABNORMAL HIGH (ref 0.0–14.0)
NEUT#: 2.1 10*3/uL (ref 1.5–6.5)
NEUT%: 54.5 % (ref 39.0–75.0)
PLATELETS: 81 10*3/uL — AB (ref 140–400)
RBC: 3.72 10*6/uL — ABNORMAL LOW (ref 4.20–5.82)
RDW: 17.1 % — ABNORMAL HIGH (ref 11.0–14.6)
WBC: 3.9 10*3/uL — ABNORMAL LOW (ref 4.0–10.3)

## 2014-04-08 LAB — HOLD TUBE, BLOOD BANK

## 2014-04-10 ENCOUNTER — Telehealth: Payer: Self-pay | Admitting: Hematology and Oncology

## 2014-04-10 ENCOUNTER — Other Ambulatory Visit: Payer: Self-pay | Admitting: Hematology and Oncology

## 2014-04-10 NOTE — Telephone Encounter (Signed)
s.w. pt and advised on march....pt ok and aware

## 2014-04-16 ENCOUNTER — Ambulatory Visit (INDEPENDENT_AMBULATORY_CARE_PROVIDER_SITE_OTHER): Payer: Managed Care, Other (non HMO) | Admitting: Family Medicine

## 2014-04-16 ENCOUNTER — Encounter: Payer: Self-pay | Admitting: Family Medicine

## 2014-04-16 VITALS — BP 120/80 | Temp 98.4°F | Wt 199.0 lb

## 2014-04-16 DIAGNOSIS — I10 Essential (primary) hypertension: Secondary | ICD-10-CM

## 2014-04-16 DIAGNOSIS — C9201 Acute myeloblastic leukemia, in remission: Secondary | ICD-10-CM

## 2014-04-16 DIAGNOSIS — E139 Other specified diabetes mellitus without complications: Secondary | ICD-10-CM

## 2014-04-16 DIAGNOSIS — E785 Hyperlipidemia, unspecified: Secondary | ICD-10-CM

## 2014-04-16 LAB — MICROALBUMIN / CREATININE URINE RATIO
CREATININE, U: 319.9 mg/dL
MICROALB/CREAT RATIO: 1.2 mg/g (ref 0.0–30.0)
Microalb, Ur: 3.9 mg/dL — ABNORMAL HIGH (ref 0.0–1.9)

## 2014-04-16 LAB — LIPID PANEL
CHOL/HDL RATIO: 3
Cholesterol: 149 mg/dL (ref 0–200)
HDL: 48.6 mg/dL (ref >39.00)
LDL Cholesterol: 80 mg/dL (ref 0–99)
NONHDL: 100.4
Triglycerides: 101 mg/dL (ref 0.0–149.0)
VLDL: 20.2 mg/dL (ref 0.0–40.0)

## 2014-04-16 LAB — BASIC METABOLIC PANEL
BUN: 15 mg/dL (ref 6–23)
CALCIUM: 9.9 mg/dL (ref 8.4–10.5)
CHLORIDE: 103 meq/L (ref 96–112)
CO2: 32 mEq/L (ref 19–32)
Creatinine, Ser: 0.96 mg/dL (ref 0.40–1.50)
GFR: 82.15 mL/min (ref >60.00)
Glucose, Bld: 132 mg/dL — ABNORMAL HIGH (ref 70–99)
Potassium: 4.5 mEq/L (ref 3.5–5.1)
Sodium: 140 mEq/L (ref 135–145)

## 2014-04-16 LAB — HEMOGLOBIN A1C: HEMOGLOBIN A1C: 7 % — AB (ref 4.6–6.5)

## 2014-04-16 MED ORDER — SIMVASTATIN 20 MG PO TABS
20.0000 mg | ORAL_TABLET | Freq: Every day | ORAL | Status: DC
Start: 2014-04-16 — End: 2014-10-20

## 2014-04-16 MED ORDER — INSULIN LISPRO PROT & LISPRO (75-25 MIX) 100 UNIT/ML ~~LOC~~ SUSP: 15.0000 [IU] | Freq: Every day | SUBCUTANEOUS | Status: DC

## 2014-04-16 MED ORDER — LOSARTAN POTASSIUM 25 MG PO TABS: 25.0000 mg | ORAL_TABLET | Freq: Every day | ORAL | Status: DC

## 2014-04-16 MED ORDER — METFORMIN HCL 500 MG PO TABS: 500.0000 mg | ORAL_TABLET | Freq: Every day | ORAL | Status: DC

## 2014-04-16 NOTE — Patient Instructions (Signed)
Stop the atenolol  Begin Cozaar 25 mg,,,, one tablet daily in the morning  Check your blood pressure daily in the morning about an hour after you take your medication  Return mid-April for follow-up  Continue other meds  Labs today  We will call you the report

## 2014-04-16 NOTE — Progress Notes (Signed)
   Subjective:    Patient ID: David Shadow., male    DOB: [birth year], 69 y.o.   MRN: [id number]  HPI David Carr is a 69 year old married male nonsmoker who comes in today for follow-up of hypertension, diabetes type 1, hyperlipidemia,  We did his physical exam last year found him to be anemic. Workup subsequent showed acute AML. He's been treated at [facility] and has done very well. He's now in remission  His underlying hypertension on Tenormin 12.5 mg daily. With his underlying diabetes the folks at [facility] suggested he may want to switch to an ACE inhibitor. We talked about the pluses and minuses of all that. 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 I therefore don't think an Ace would be an appropriate drug. ARB  may be better  He takes 15 units of insulin at bedtime and 520 minutes and metformin with his evening meal. Blood sugars 100 1:15  He takes Zocor 20 mg daily for hyperlipidemia.   Review of Systems Review of systems otherwise negative he feels well. He lost a total of 32 pounds during his chemotherapy treatment etc. He's now gained about 15 back. His weight is 199 pounds.  He had a flu shot in December at [facility]    Objective:   Physical Exam  Well-developed well-nourished  male no acute distress vital signs stable he is afebrile BP 120/80      Assessment & Plan:  Diabetes type 1,,, check labs  Hyperlipidemia,,,, continue Zocor check labs  Hypertension,,,,,,, switched to Cozaar  AML,,,,,, in remission treated by Dr. Nadara Mustard at [place]

## 2014-04-16 NOTE — Progress Notes (Signed)
Pre visit review using our clinic review tool, if applicable. No additional management support is needed unless otherwise documented below in the visit note. 

## 2014-04-22 ENCOUNTER — Telehealth: Payer: Self-pay | Admitting: Family Medicine

## 2014-04-22 NOTE — Telephone Encounter (Signed)
Attempted to call patient but number has been disconnected. Please get new number from patient when he calls back.

## 2014-04-22 NOTE — Telephone Encounter (Addendum)
Pt started losartan (COZAAR) 25 MG tablet as directed and woke up in the night and his states his pulse was 190 .(from bp cuff) Pt states he tried several more times and it was registering high.  Pt does not want to try this med again and would like alternate.  Pt states when he got up this am it was 85, and right now he feels normal. But would like a cb to discuss

## 2014-04-22 NOTE — Telephone Encounter (Addendum)
Closed in error/opened another phone note

## 2014-04-23 NOTE — Telephone Encounter (Signed)
Pt states he did not take the new medicine. Took the old and his pulse is back to the 60's. The correct number is [phone]

## 2014-04-23 NOTE — Telephone Encounter (Signed)
Spoke with patient okay per Dr Sherren Mocha

## 2014-05-07 ENCOUNTER — Other Ambulatory Visit (HOSPITAL_BASED_OUTPATIENT_CLINIC_OR_DEPARTMENT_OTHER): Payer: Managed Care, Other (non HMO)

## 2014-05-07 ENCOUNTER — Ambulatory Visit (HOSPITAL_BASED_OUTPATIENT_CLINIC_OR_DEPARTMENT_OTHER): Payer: Managed Care, Other (non HMO) | Admitting: Hematology and Oncology

## 2014-05-07 ENCOUNTER — Encounter: Payer: Self-pay | Admitting: Hematology and Oncology

## 2014-05-07 ENCOUNTER — Telehealth: Payer: Self-pay | Admitting: Hematology and Oncology

## 2014-05-07 VITALS — BP 132/82 | HR 81 | Temp 98.4°F | Resp 19 | Ht 68.75 in | Wt 201.1 lb

## 2014-05-07 DIAGNOSIS — C9201 Acute myeloblastic leukemia, in remission: Secondary | ICD-10-CM

## 2014-05-07 DIAGNOSIS — D630 Anemia in neoplastic disease: Secondary | ICD-10-CM | POA: Diagnosis not present

## 2014-05-07 DIAGNOSIS — D6959 Other secondary thrombocytopenia: Secondary | ICD-10-CM

## 2014-05-07 DIAGNOSIS — T50905A Adverse effect of unspecified drugs, medicaments and biological substances, initial encounter: Secondary | ICD-10-CM

## 2014-05-07 LAB — CBC WITH DIFFERENTIAL/PLATELET
BASO%: 0.5 % (ref 0.0–2.0)
Basophils Absolute: 0 10*3/uL (ref 0.0–0.1)
EOS ABS: 0 10*3/uL (ref 0.0–0.5)
EOS%: 0.5 % (ref 0.0–7.0)
HCT: 38.3 % — ABNORMAL LOW (ref 38.4–49.9)
HGB: 12.8 g/dL — ABNORMAL LOW (ref 13.0–17.1)
LYMPH#: 1 10*3/uL (ref 0.9–3.3)
LYMPH%: 19.3 % (ref 14.0–49.0)
MCH: 32.7 pg (ref 27.2–33.4)
MCHC: 33.4 g/dL (ref 32.0–36.0)
MCV: 97.9 fL (ref 79.3–98.0)
MONO#: 0.4 10*3/uL (ref 0.1–0.9)
MONO%: 8 % (ref 0.0–14.0)
NEUT#: 3.6 10*3/uL (ref 1.5–6.5)
NEUT%: 71.7 % (ref 39.0–75.0)
PLATELETS: 109 10*3/uL — AB (ref 140–400)
RBC: 3.91 10*6/uL — AB (ref 4.20–5.82)
RDW: 20.8 % — AB (ref 11.0–14.6)
WBC: 5.1 10*3/uL (ref 4.0–10.3)

## 2014-05-07 NOTE — Telephone Encounter (Signed)
gv and printed appt sched and avs for pt for April  °

## 2014-05-07 NOTE — Assessment & Plan Note (Signed)
This is likely due to recent treatment. The patient denies recent history of bleeding such as epistaxis, hematuria or hematochezia. He is asymptomatic from the low platelet count. I will observe for now.   Per protocol/instrucion from [facility] will transfuse 1 unit of irradiated platelets if platelet count is less than 20,000 or bleeding.

## 2014-05-07 NOTE — Assessment & Plan Note (Signed)
He tolerated treatment well. He will return to [facility] for follow-up in 2 months. Clinically, he is in remission. Continue supportive care. The patient wants to keep his port for a while. He will need port flushes appointment in the future.

## 2014-05-07 NOTE — Assessment & Plan Note (Signed)
Per guidelines from [facility], we will give him 2 units of blood whenever his hemoglobin dropped to less than 9 g as the patient is symptomatic. He does not need blood transfusion today. He will come back in 1 month for further follow-up.

## 2014-05-07 NOTE — Progress Notes (Signed)
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, NPM1 positive   09/17/2013 -  Chemotherapy He was given induction chemotherapy at [facility] (7+3)   10/01/2013 -  Chemotherapy He had repeat induction chemotherapy due to persistent disease (5+2)   11/06/2013 Bone Marrow Biopsy Bone marrow biopsy confirmed remission   11/13/2013 - 11/17/2013 Chemotherapy He received cycle 1 of consolidation chemotherapy with HiDAC   12/18/2013 - 12/22/2013 Chemotherapy He received cycle 2 of consolidation chemotherapy with HiDAC   01/23/2014 - 01/28/2014 Chemotherapy he received cycle 3 of consolidation chemotherapy with HiDAC   03/05/2014 - 03/10/2014 Hospital Admission he received cycle 4 of consolidation chemotherapy with HiDAC    INTERVAL HISTORY: Please see below for problem oriented charting. He is seen as part of his routine follow-up. He denies recent infection. He feels well.  REVIEW OF SYSTEMS:   Constitutional: Denies fevers, chills or abnormal weight loss Eyes: Denies blurriness of vision Ears, nose, mouth, throat, and face: Denies mucositis or sore throat Respiratory: Denies cough, dyspnea or wheezes Cardiovascular: Denies palpitation, chest discomfort or lower extremity swelling Gastrointestinal:  Denies nausea, heartburn or change in bowel habits Skin: Denies abnormal skin rashes Lymphatics: Denies new lymphadenopathy or easy bruising Neurological:Denies numbness, tingling or new weaknesses Behavioral/Psych: Mood is stable, no new changes  All other systems were reviewed with the patient and are negative.  I have reviewed the past medical history, past surgical history, social history and family history with the patient and they are unchanged from previous note.  ALLERGIES:  is  allergic to morphine and related.  MEDICATIONS:  Current Outpatient Prescriptions  Medication Sig Dispense Refill  . atenolol (TENORMIN) 25 MG tablet Take 12.5 mg by mouth daily.    Marland Kitchen glucose blood (FREESTYLE LITE) test strip Test once daily dx 250.00 100 each 3  . insulin lispro protamine-lispro (HUMALOG 75/25 MIX) (75-25) 100 UNIT/ML SUSP injection Inject 15 Units into the skin at bedtime. 3 vial 11  . INSULIN SYRINGE .5CC/29G (B-D INSULIN SYRINGE) 29G X 1/2" 0.5 ML MISC Use once daily, dx 250.00 100 each 3  . metFORMIN (GLUCOPHAGE) 500 MG tablet Take 1 tablet (500 mg total) by mouth daily with breakfast. 100 tablet 3  . simvastatin (ZOCOR) 20 MG tablet Take 1 tablet (20 mg total) by mouth at bedtime. 100 tablet 3   No current facility-administered medications for this visit.    PHYSICAL EXAMINATION: ECOG PERFORMANCE STATUS: 0 - Asymptomatic  Filed Vitals:   05/07/14 1031  BP: 132/82  Pulse: 81  Temp: 98.4 F (36.9 C)  Resp: 19   Filed Weights   05/07/14 1031  Weight: 201 lb 1.6 oz (91.218 kg)    GENERAL:alert, no distress and comfortable SKIN: skin color, texture, turgor are normal, no rashes or significant lesions EYES: normal, Conjunctiva are pink and non-injected, sclera clear OROPHARYNX:no exudate, no erythema and lips, buccal mucosa, and tongue normal  NECK: supple, thyroid normal size, non-tender, without nodularity LYMPH:  no palpable lymphadenopathy in the cervical, axillary or inguinal LUNGS: clear to auscultation and percussion with normal breathing effort HEART: regular rate & rhythm with soft systolic murmur, and no lower extremity edema ABDOMEN:abdomen soft, non-tender and normal bowel sounds Musculoskeletal:no cyanosis of digits and no clubbing  NEURO: alert & oriented x 3 with fluent  speech, no focal motor/sensory deficits  LABORATORY DATA:  I have reviewed the data as listed    Component Value Date/Time   NA 140 04/16/2014 1226   NA 141 04/06/2014  0805   K 4.5 04/16/2014 1226   K 4.0 04/06/2014 0805   CL 103 04/16/2014 1226   CO2 32 04/16/2014 1226   CO2 23 04/06/2014 0805   GLUCOSE 132* 04/16/2014 1226   GLUCOSE 160* 04/06/2014 0805   BUN 15 04/16/2014 1226   BUN 12.8 04/06/2014 0805   CREATININE 0.96 04/16/2014 1226   CREATININE 0.9 04/06/2014 0805   CALCIUM 9.9 04/16/2014 1226   CALCIUM 9.3 04/06/2014 0805   PROT 6.7 04/06/2014 0805   PROT 6.6 08/22/2013 0758   ALBUMIN 3.4* 04/06/2014 0805   ALBUMIN 3.8 08/22/2013 0758   AST 22 04/06/2014 0805   AST 23 08/22/2013 0758   ALT 18 04/06/2014 0805   ALT 17 08/22/2013 0758   ALKPHOS 85 04/06/2014 0805   ALKPHOS 41 08/22/2013 0758   BILITOT 0.49 04/06/2014 0805   BILITOT 0.8 08/22/2013 0758   GFRNONAA 71.09 01/24/2010 0753   GFRAA 78 01/20/2008 0755    No results found for: SPEP, UPEP  Lab Results  Component Value Date   WBC 5.1 05/07/2014   NEUTROABS 3.6 05/07/2014   HGB 12.8* 05/07/2014   HCT 38.3* 05/07/2014   MCV 97.9 05/07/2014   PLT 109* 05/07/2014      Chemistry      Component Value Date/Time   NA 140 04/16/2014 1226   NA 141 04/06/2014 0805   K 4.5 04/16/2014 1226   K 4.0 04/06/2014 0805   CL 103 04/16/2014 1226   CO2 32 04/16/2014 1226   CO2 23 04/06/2014 0805   BUN 15 04/16/2014 1226   BUN 12.8 04/06/2014 0805   CREATININE 0.96 04/16/2014 1226   CREATININE 0.9 04/06/2014 0805      Component Value Date/Time   CALCIUM 9.9 04/16/2014 1226   CALCIUM 9.3 04/06/2014 0805   ALKPHOS 85 04/06/2014 0805   ALKPHOS 41 08/22/2013 0758   AST 22 04/06/2014 0805   AST 23 08/22/2013 0758   ALT 18 04/06/2014 0805   ALT 17 08/22/2013 0758   BILITOT 0.49 04/06/2014 0805   BILITOT 0.8 08/22/2013 0758      ASSESSMENT & PLAN:  AML (acute myeloid leukemia) in remission He tolerated treatment well. He will return to [facility] for follow-up in 2 months. Clinically, he is in remission. Continue supportive care. The patient wants to  keep his port for a while. He will need port flushes appointment in the future.       Anemia in neoplastic disease Per guidelines from [facility], we will give him 2 units of blood whenever his hemoglobin dropped to less than 9 g as the patient is symptomatic. He does not need blood transfusion today. He will come back in 1 month for further follow-up.     Thrombocytopenia due to drugs This is likely due to recent treatment. The patient denies recent history of bleeding such as epistaxis, hematuria or hematochezia. He is asymptomatic from the low platelet count. I will observe for now.   Per protocol/instrucion from [facility] will transfuse 1 unit of irradiated platelets if platelet count is less than 20,000 or bleeding.    Orders Placed This Encounter  Procedures  . Comprehensive metabolic panel    Standing Status: Future     Number of Occurrences:  Standing Expiration Date: 06/11/2015   All questions were answered. The patient knows to call the clinic with any problems, questions or concerns. No barriers to learning was detected. I spent 15 minutes counseling the patient face to face. The total time spent in the appointment was 20 minutes and more than 50% was on counseling and review of test results     [facility], [place], MD 05/07/2014 11:08 AM

## 2014-05-12 ENCOUNTER — Other Ambulatory Visit: Payer: Self-pay | Admitting: Hematology and Oncology

## 2014-05-28 ENCOUNTER — Ambulatory Visit: Payer: Managed Care, Other (non HMO) | Admitting: Family Medicine

## 2014-06-11 ENCOUNTER — Other Ambulatory Visit: Payer: Managed Care, Other (non HMO)

## 2014-06-11 ENCOUNTER — Ambulatory Visit (HOSPITAL_BASED_OUTPATIENT_CLINIC_OR_DEPARTMENT_OTHER): Payer: Managed Care, Other (non HMO) | Admitting: Hematology and Oncology

## 2014-06-11 ENCOUNTER — Ambulatory Visit (HOSPITAL_BASED_OUTPATIENT_CLINIC_OR_DEPARTMENT_OTHER): Payer: Managed Care, Other (non HMO)

## 2014-06-11 ENCOUNTER — Telehealth: Payer: Self-pay | Admitting: Hematology and Oncology

## 2014-06-11 ENCOUNTER — Encounter: Payer: Self-pay | Admitting: Hematology and Oncology

## 2014-06-11 ENCOUNTER — Other Ambulatory Visit: Payer: Self-pay | Admitting: Hematology and Oncology

## 2014-06-11 VITALS — BP 132/69 | HR 73 | Temp 98.1°F | Resp 19 | Ht 68.0 in | Wt 204.2 lb

## 2014-06-11 DIAGNOSIS — D701 Agranulocytosis secondary to cancer chemotherapy: Secondary | ICD-10-CM

## 2014-06-11 DIAGNOSIS — Z95828 Presence of other vascular implants and grafts: Secondary | ICD-10-CM

## 2014-06-11 DIAGNOSIS — C9201 Acute myeloblastic leukemia, in remission: Secondary | ICD-10-CM

## 2014-06-11 DIAGNOSIS — D630 Anemia in neoplastic disease: Secondary | ICD-10-CM

## 2014-06-11 DIAGNOSIS — T50905A Adverse effect of unspecified drugs, medicaments and biological substances, initial encounter: Secondary | ICD-10-CM

## 2014-06-11 DIAGNOSIS — D72819 Decreased white blood cell count, unspecified: Secondary | ICD-10-CM | POA: Diagnosis not present

## 2014-06-11 DIAGNOSIS — D6959 Other secondary thrombocytopenia: Secondary | ICD-10-CM | POA: Diagnosis not present

## 2014-06-11 DIAGNOSIS — T451X5A Adverse effect of antineoplastic and immunosuppressive drugs, initial encounter: Secondary | ICD-10-CM

## 2014-06-11 MED ORDER — HEPARIN SOD (PORK) LOCK FLUSH 100 UNIT/ML IV SOLN
500.0000 [IU] | Freq: Once | INTRAVENOUS | Status: AC
  Administered 2014-06-11: 500 [IU] via INTRAVENOUS
  Filled 2014-06-11: qty 5

## 2014-06-11 MED ORDER — SODIUM CHLORIDE 0.9 % IJ SOLN
10.0000 mL | INTRAMUSCULAR | Status: DC | PRN
  Administered 2014-06-11: 10 mL via INTRAVENOUS
  Filled 2014-06-11: qty 10

## 2014-06-11 NOTE — Patient Instructions (Signed)

## 2014-06-11 NOTE — Assessment & Plan Note (Signed)
Overall, clinically he appears to be in remission. Surprisingly, he has mild worsening leukopenia and thrombocytopenia. I spoke with the lab technician who felt that it could be a laboratory error because his complete metabolic panel is also inaccurate. I recommend he recheck CBC next week with the peripheral smear for review. I will see him back again for further evaluation.

## 2014-06-11 NOTE — Assessment & Plan Note (Signed)
This is likely anemia of chronic disease. The patient denies recent history of bleeding such as epistaxis, hematuria or hematochezia. He is asymptomatic from the anemia. We will observe for now.  He does not require transfusion now.   

## 2014-06-11 NOTE — Assessment & Plan Note (Signed)
Thrombocytopenia is slightly worse. I plan to recheck next week. So far he is not symptomatic.

## 2014-06-11 NOTE — Assessment & Plan Note (Signed)
It is a little bit concerning although his differential and peripheral smear did not show any suspicious cells. I plan to recheck it next week.

## 2014-06-11 NOTE — Progress Notes (Signed)
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, NPM1 positive   09/17/2013 -  Chemotherapy He was given induction chemotherapy at [facility] (7+3)   10/01/2013 -  Chemotherapy He had repeat induction chemotherapy due to persistent disease (5+2)   11/06/2013 Bone Marrow Biopsy Bone marrow biopsy confirmed remission   11/13/2013 - 11/17/2013 Chemotherapy He received cycle 1 of consolidation chemotherapy with HiDAC   12/18/2013 - 12/22/2013 Chemotherapy He received cycle 2 of consolidation chemotherapy with HiDAC   01/23/2014 - 01/28/2014 Chemotherapy he received cycle 3 of consolidation chemotherapy with HiDAC   03/05/2014 - 03/10/2014 Hospital Admission he received cycle 4 of consolidation chemotherapy with HiDAC    INTERVAL HISTORY: Please see below for problem oriented charting. He returns for further follow-up. He feels fine. He has mild sensation of palpitation recently with exertion but they resolve spontaneously. Denies recent infection. The patient denies any recent signs or symptoms of bleeding such as spontaneous epistaxis, hematuria or hematochezia.   REVI EW OF SYSTEMS:   Constitutional: Denies fevers, chills or abnormal weight loss Eyes: Denies blurriness of vision Ears, nose, mouth, throat, and face: Denies mucositis or sore throat Respiratory: Denies cough, dyspnea or wheezes Cardiovascular: Denies palpitation, chest discomfort or lower extremity swelling Gastrointestinal:  Denies nausea, heartburn or change in bowel habits Skin: Denies abnormal skin rashes Lymphatics: Denies new lymphadenopathy or easy bruising Neurological:Denies numbness, tingling or new weaknesses Behavioral/Psych: Mood is stable, no new changes  All other systems were reviewed with the patient and  are negative.  I have reviewed the past medical history, past surgical history, social history and family history with the patient and they are unchanged from previous note.  ALLERGIES:  is allergic to morphine and related.  MEDICATIONS:  Current Outpatient Prescriptions  Medication Sig Dispense Refill  . atenolol (TENORMIN) 25 MG tablet Take 12.5 mg by mouth daily.    Marland Kitchen glucose blood (FREESTYLE LITE) test strip Test once daily dx 250.00 100 each 3  . insulin lispro protamine-lispro (HUMALOG 75/25 MIX) (75-25) 100 UNIT/ML SUSP injection Inject 15 Units into the skin at bedtime. 3 vial 11  . INSULIN SYRINGE .5CC/29G (B-D INSULIN SYRINGE) 29G X 1/2" 0.5 ML MISC Use once daily, dx 250.00 100 each 3  . metFORMIN (GLUCOPHAGE) 500 MG tablet Take 1 tablet (500 mg total) by mouth daily with breakfast. 100 tablet 3  . simvastatin (ZOCOR) 20 MG tablet Take 1 tablet (20 mg total) by mouth at bedtime. 100 tablet 3   No current facility-administered medications for this visit.    PHYSICAL EXAMINATION: ECOG PERFORMANCE STATUS: 0 - Asymptomatic  Filed Vitals:   06/11/14 1002  BP: 132/69  Pulse: 73  Temp: 98.1 F (36.7 C)  Resp: 19   Filed Weights   06/11/14 1002  Weight: 204 lb 3.2 oz (92.625 kg)    GENERAL:alert, no distress and comfortable SKIN: skin color, texture, turgor are normal, no rashes or significant lesions EYES: normal, Conjunctiva are pink and non-injected, sclera clear OROPHARYNX:no exudate, no erythema and lips, buccal mucosa, and tongue normal  NECK: supple, thyroid normal size, non-tender, without nodularity LYMPH:  no palpable lymphadenopathy in the cervical, axillary or inguinal LUNGS: clear to auscultation and percussion with normal breathing effort HEART: regular rate & rhythm and no murmurs and no  lower extremity edema ABDOMEN:abdomen soft, non-tender and normal bowel sounds Musculoskeletal:no cyanosis of digits and no clubbing  NEURO: alert & oriented x 3 with  fluent speech, no focal motor/sensory deficits  LABORATORY DATA:  I have reviewed the data as listed    Component Value Date/Time   NA 140 04/16/2014 1226   NA 141 04/06/2014 0805   K 4.5 04/16/2014 1226   K 4.0 04/06/2014 0805   CL 103 04/16/2014 1226   CO2 32 04/16/2014 1226   CO2 23 04/06/2014 0805   GLUCOSE 132* 04/16/2014 1226   GLUCOSE 160* 04/06/2014 0805   BUN 15 04/16/2014 1226   BUN 12.8 04/06/2014 0805   CREATININE 0.96 04/16/2014 1226   CREATININE 0.9 04/06/2014 0805   CALCIUM 9.9 04/16/2014 1226   CALCIUM 9.3 04/06/2014 0805   PROT 6.7 04/06/2014 0805   PROT 6.6 08/22/2013 0758   ALBUMIN 3.4* 04/06/2014 0805   ALBUMIN 3.8 08/22/2013 0758   AST 22 04/06/2014 0805   AST 23 08/22/2013 0758   ALT 18 04/06/2014 0805   ALT 17 08/22/2013 0758   ALKPHOS 85 04/06/2014 0805   ALKPHOS 41 08/22/2013 0758   BILITOT 0.49 04/06/2014 0805   BILITOT 0.8 08/22/2013 0758   GFRNONAA 71.09 01/24/2010 0753   GFRAA 78 01/20/2008 0755    No results found for: SPEP, UPEP  Lab Results  Component Value Date   WBC 5.1 05/07/2014   NEUTROABS 3.6 05/07/2014   HGB 12.8* 05/07/2014   HCT 38.3* 05/07/2014   MCV 97.9 05/07/2014   PLT 109* 05/07/2014      Chemistry      Component Value Date/Time   NA 140 04/16/2014 1226   NA 141 04/06/2014 0805   K 4.5 04/16/2014 1226   K 4.0 04/06/2014 0805   CL 103 04/16/2014 1226   CO2 32 04/16/2014 1226   CO2 23 04/06/2014 0805   BUN 15 04/16/2014 1226   BUN 12.8 04/06/2014 0805   CREATININE 0.96 04/16/2014 1226   CREATININE 0.9 04/06/2014 0805      Component Value Date/Time   CALCIUM 9.9 04/16/2014 1226   CALCIUM 9.3 04/06/2014 0805   ALKPHOS 85 04/06/2014 0805   ALKPHOS 41 08/22/2013 0758   AST 22 04/06/2014 0805   AST 23 08/22/2013 0758   ALT 18 04/06/2014 0805   ALT 17 08/22/2013 0758   BILITOT 0.49 04/06/2014 0805   BILITOT 0.8 08/22/2013 0758     I reviewed the peripheral smear. I did not see any suspicious cells  increased blasts. Absolute thrombocytopenia is seen. No platelet clumping is noted.  ASSESSMENT & PLAN:  AML (acute myeloid leukemia) in remission Overall, clinically he appears to be in remission. Surprisingly, he has mild worsening leukopenia and thrombocytopenia. I spoke with the lab technician who felt that it could be a laboratory error because his complete metabolic panel is also inaccurate. I recommend he recheck CBC next week with the peripheral smear for review. I will see him back again for further evaluation.   Anemia in neoplastic disease This is likely anemia of chronic disease. The patient denies recent history of bleeding such as epistaxis, hematuria or hematochezia. He is asymptomatic from the anemia. We will observe for now.  He does not require transfusion now.      Thrombocytopenia due to drugs Thrombocytopenia is slightly worse. I plan to recheck next week. So far he is not symptomatic.   Leukopenia due to antineoplastic chemotherapy It is a little bit concerning although  his differential and peripheral smear did not show any suspicious cells. I plan to recheck it next week.    Orders Placed This Encounter  Procedures  . Sedimentation rate    Standing Status: Future     Number of Occurrences:      Standing Expiration Date: 07/16/2015  . Morphology    Standing Status: Future     Number of Occurrences:      Standing Expiration Date: 07/16/2015   All questions were answered. The patient knows to call the clinic with any problems, questions or concerns. No barriers to learning was detected. I spent 25 minutes counseling the patient face to face. The total time spent in the appointment was 30 minutes and more than 50% was on counseling and review of test results     [facility], Chrissa Meetze, MD 06/11/2014 2:12 PM

## 2014-06-11 NOTE — Telephone Encounter (Signed)
Pt confirmed labs/ov per 04/27 POF, gave AVS and Calendar..... KJ  °

## 2014-06-17 ENCOUNTER — Other Ambulatory Visit (HOSPITAL_BASED_OUTPATIENT_CLINIC_OR_DEPARTMENT_OTHER): Payer: Managed Care, Other (non HMO)

## 2014-06-17 ENCOUNTER — Encounter: Payer: Self-pay | Admitting: Hematology and Oncology

## 2014-06-17 ENCOUNTER — Ambulatory Visit (HOSPITAL_BASED_OUTPATIENT_CLINIC_OR_DEPARTMENT_OTHER): Payer: Managed Care, Other (non HMO) | Admitting: Hematology and Oncology

## 2014-06-17 ENCOUNTER — Telehealth: Payer: Self-pay | Admitting: Hematology and Oncology

## 2014-06-17 ENCOUNTER — Telehealth: Payer: Self-pay | Admitting: *Deleted

## 2014-06-17 VITALS — BP 128/80 | HR 83 | Temp 98.4°F | Resp 18 | Ht 68.0 in | Wt 203.0 lb

## 2014-06-17 DIAGNOSIS — T50905A Adverse effect of unspecified drugs, medicaments and biological substances, initial encounter: Secondary | ICD-10-CM

## 2014-06-17 DIAGNOSIS — D72819 Decreased white blood cell count, unspecified: Secondary | ICD-10-CM

## 2014-06-17 DIAGNOSIS — C9201 Acute myeloblastic leukemia, in remission: Secondary | ICD-10-CM

## 2014-06-17 DIAGNOSIS — D6959 Other secondary thrombocytopenia: Secondary | ICD-10-CM | POA: Diagnosis not present

## 2014-06-17 DIAGNOSIS — J069 Acute upper respiratory infection, unspecified: Secondary | ICD-10-CM | POA: Diagnosis not present

## 2014-06-17 DIAGNOSIS — T451X5A Adverse effect of antineoplastic and immunosuppressive drugs, initial encounter: Secondary | ICD-10-CM

## 2014-06-17 DIAGNOSIS — D701 Agranulocytosis secondary to cancer chemotherapy: Secondary | ICD-10-CM

## 2014-06-17 LAB — CBC WITH DIFFERENTIAL/PLATELET
BASO%: 0.4 % (ref 0.0–2.0)
Basophils Absolute: 0 10*3/uL (ref 0.0–0.1)
EOS%: 1.3 % (ref 0.0–7.0)
Eosinophils Absolute: 0 10*3/uL (ref 0.0–0.5)
HEMATOCRIT: 39.8 % (ref 38.4–49.9)
HGB: 13.8 g/dL (ref 13.0–17.1)
LYMPH%: 24 % (ref 14.0–49.0)
MCH: 34.7 pg — AB (ref 27.2–33.4)
MCHC: 34.7 g/dL (ref 32.0–36.0)
MCV: 100.1 fL — ABNORMAL HIGH (ref 79.3–98.0)
MONO#: 0.3 10*3/uL (ref 0.1–0.9)
MONO%: 9.8 % (ref 0.0–14.0)
NEUT#: 1.9 10*3/uL (ref 1.5–6.5)
NEUT%: 64.5 % (ref 39.0–75.0)
Platelets: 79 10*3/uL — ABNORMAL LOW (ref 140–400)
RBC: 3.98 10*6/uL — ABNORMAL LOW (ref 4.20–5.82)
RDW: 15.7 % — ABNORMAL HIGH (ref 11.0–14.6)
WBC: 2.9 10*3/uL — ABNORMAL LOW (ref 4.0–10.3)
lymph#: 0.7 10*3/uL — ABNORMAL LOW (ref 0.9–3.3)

## 2014-06-17 LAB — COMPREHENSIVE METABOLIC PANEL (CC13)
ALT: 24 U/L (ref 0–55)
AST: 18 U/L (ref 5–34)
Albumin: 3.5 g/dL (ref 3.5–5.0)
Alkaline Phosphatase: 86 U/L (ref 40–150)
Anion Gap: 10 mEq/L (ref 3–11)
BUN: 14.6 mg/dL (ref 7.0–26.0)
CHLORIDE: 104 meq/L (ref 98–109)
CO2: 23 mEq/L (ref 22–29)
CREATININE: 0.9 mg/dL (ref 0.7–1.3)
Calcium: 8.9 mg/dL (ref 8.4–10.4)
EGFR: 82 mL/min/{1.73_m2} — AB (ref >90)
Glucose: 260 mg/dl — ABNORMAL HIGH (ref 70–140)
Potassium: 3.8 mEq/L (ref 3.5–5.1)
SODIUM: 138 meq/L (ref 136–145)
TOTAL PROTEIN: 6.6 g/dL (ref 6.4–8.3)
Total Bilirubin: 0.55 mg/dL (ref 0.20–1.20)

## 2014-06-17 LAB — MORPHOLOGY
BLASTS: 1 % (ref 0–0)
PLT EST: DECREASED

## 2014-06-17 NOTE — Telephone Encounter (Signed)
Faxed Today's labs to [facility],  Dr. Nadara Mustard, at fax [phone].   Left VM for Dr. Nadara Mustard informing Dr. Alvy Bimler needs to speak w/ her regarding pt's labs and he needs to be seen soon at [facility].  Dr. Alvy Bimler informed Dr. Nadara Mustard and she recommends weekly labs for CBC and pt will f/u at [facility] in 3 weeks as scheduled.  Informed pt of above and weekly lab appts.  Pt made lab appt for next 2 weeks as he returns to [facility] in 3 weeks.  Informed pt Dr. Alvy Bimler said she would call Dr. Nadara Mustard again if his labs get worse next week.  He verbalized understanding.

## 2014-06-17 NOTE — Telephone Encounter (Signed)
per pof to sch pt appt-gave pt copy of sch °

## 2014-06-18 ENCOUNTER — Telehealth: Payer: Self-pay | Admitting: Hematology and Oncology

## 2014-06-18 LAB — SEDIMENTATION RATE: Sed Rate: 13 mm/hr (ref 0–20)

## 2014-06-18 NOTE — Assessment & Plan Note (Signed)
His symptoms are improving. At this point, I do not recommend antibiotic therapy and continue observation and conservative management with over-the-counter antihistamines for decongestions.

## 2014-06-18 NOTE — Assessment & Plan Note (Signed)
This is new, could be related to viral illness versus recurrence of disease. In the meantime, he is not symptomatic. Continue close observation with weekly CBC

## 2014-06-18 NOTE — Assessment & Plan Note (Signed)
The recent pancytopenia could be related to recent viral infection. I have reviewed his peripheral smear closely with the pathologist. There were no increased blasts seen. Some large reactive lymphocytes were seen with absolute thrombocytopenia. I have discussed this case with his physician at [place]. She recommend close monitoring of CBC on a weekly basis. He has appointment to go back to [facility] in 3 weeks and his physician does not think he needs to return sooner. I will see him back a month from now.

## 2014-06-18 NOTE — Assessment & Plan Note (Signed)
He has new onset leukopenia, could be related to viral illness. As above, we will continue weekly CBC monitoring.

## 2014-06-18 NOTE — Progress Notes (Signed)
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, NPM1 positive   09/17/2013 -  Chemotherapy He was given induction chemotherapy at [facility] (7+3)   10/01/2013 -  Chemotherapy He had repeat induction chemotherapy due to persistent disease (5+2)   11/06/2013 Bone Marrow Biopsy Bone marrow biopsy confirmed remission   11/13/2013 - 11/17/2013 Chemotherapy He received cycle 1 of consolidation chemotherapy with HiDAC   12/18/2013 - 12/22/2013 Chemotherapy He received cycle 2 of consolidation chemotherapy with HiDAC   01/23/2014 - 01/28/2014 Chemotherapy he received cycle 3 of consolidation chemotherapy with HiDAC   03/05/2014 - 03/10/2014 Hospital Admission he received cycle 4 of consolidation chemotherapy with HiDAC    INTERVAL HISTORY: Please see below for problem oriented charting. He returns for further follow-up. He complained of viral like illness last week with fatigue, nasal drainage and nasal congestion. Denies any sore throat or fevers. Overall, he feels that he is improving. The patient denies any recent signs or symptoms of bleeding such as spontaneous epistaxis, hematuria or hematochezia.   REVIEW OF SYSTEMS:   Constitutional: Denies fevers, chills or abnormal weight loss Eyes: Denies blurriness of vision Ears, nose, mouth, throat, and face: Denies mucositis or sore throat Respiratory: Denies cough, dyspnea or wheezes Cardiovascular: Denies palpitation, chest discomfort or lower extremity swelling Gastrointestinal:  Denies nausea, heartburn or change in bowel habits Skin: Denies abnormal skin rashes Lymphatics: Denies new lymphadenopathy or easy bruising Neurological:Denies numbness, tingling or new weaknesses Behavioral/Psych: Mood is stable, no new changes  All other systems  were reviewed with the patient and are negative.  I have reviewed the past medical history, past surgical history, social history and family history with the patient and they are unchanged from previous note.  ALLERGIES:  is allergic to morphine and related.  MEDICATIONS:  Current Outpatient Prescriptions  Medication Sig Dispense Refill  . atenolol (TENORMIN) 25 MG tablet Take 12.5 mg by mouth daily.    Marland Kitchen glucose blood (FREESTYLE LITE) test strip Test once daily dx 250.00 100 each 3  . insulin lispro protamine-lispro (HUMALOG 75/25 MIX) (75-25) 100 UNIT/ML SUSP injection Inject 15 Units into the skin at bedtime. 3 vial 11  . INSULIN SYRINGE .5CC/29G (B-D INSULIN SYRINGE) 29G X 1/2" 0.5 ML MISC Use once daily, dx 250.00 100 each 3  . metFORMIN (GLUCOPHAGE) 500 MG tablet Take 1 tablet (500 mg total) by mouth daily with breakfast. (Patient taking differently: Take 500 mg by mouth daily with supper. ) 100 tablet 3  . simvastatin (ZOCOR) 20 MG tablet Take 1 tablet (20 mg total) by mouth at bedtime. 100 tablet 3   No current facility-administered medications for this visit.    PHYSICAL EXAMINATION: ECOG PERFORMANCE STATUS: 1 - Symptomatic but completely ambulatory  Filed Vitals:   06/17/14 0949  BP: 128/80  Pulse: 83  Temp: 98.4 F (36.9 C)  Resp: 18   Filed Weights   06/17/14 0949  Weight: 203 lb (92.08 kg)    GENERAL:alert, no distress and comfortable SKIN: skin color, texture, turgor are normal, no rashes or significant lesions EYES: normal, Conjunctiva are pink and non-injected, sclera clear OROPHARYNX:no exudate, no erythema and lips, buccal mucosa, and tongue normal  NECK: supple, thyroid normal size, non-tender, without nodularity LYMPH:  no palpable lymphadenopathy in the cervical, axillary or  inguinal LUNGS: clear to auscultation and percussion with normal breathing effort HEART: regular rate & rhythm and no murmurs and no lower extremity edema ABDOMEN:abdomen soft,  non-tender and normal bowel sounds Musculoskeletal:no cyanosis of digits and no clubbing  NEURO: alert & oriented x 3 with fluent speech, no focal motor/sensory deficits  LABORATORY DATA:  I have reviewed the data as listed    Component Value Date/Time   NA 138 06/17/2014 0936   NA 140 04/16/2014 1226   K 3.8 06/17/2014 0936   K 4.5 04/16/2014 1226   CL 103 04/16/2014 1226   CO2 23 06/17/2014 0936   CO2 32 04/16/2014 1226   GLUCOSE 260* 06/17/2014 0936   GLUCOSE 132* 04/16/2014 1226   BUN 14.6 06/17/2014 0936   BUN 15 04/16/2014 1226   CREATININE 0.9 06/17/2014 0936   CREATININE 0.96 04/16/2014 1226   CALCIUM 8.9 06/17/2014 0936   CALCIUM 9.9 04/16/2014 1226   PROT 6.6 06/17/2014 0936   PROT 6.6 08/22/2013 0758   ALBUMIN 3.5 06/17/2014 0936   ALBUMIN 3.8 08/22/2013 0758   AST 18 06/17/2014 0936   AST 23 08/22/2013 0758   ALT 24 06/17/2014 0936   ALT 17 08/22/2013 0758   ALKPHOS 86 06/17/2014 0936   ALKPHOS 41 08/22/2013 0758   BILITOT 0.55 06/17/2014 0936   BILITOT 0.8 08/22/2013 0758   GFRNONAA 71.09 01/24/2010 0753   GFRAA 78 01/20/2008 0755    No results found for: SPEP, UPEP  Lab Results  Component Value Date   WBC 2.9* 06/17/2014   NEUTROABS 1.9 06/17/2014   HGB 13.8 06/17/2014   HCT 39.8 06/17/2014   MCV 100.1* 06/17/2014   PLT 79* 06/17/2014      Chemistry      Component Value Date/Time   NA 138 06/17/2014 0936   NA 140 04/16/2014 1226   K 3.8 06/17/2014 0936   K 4.5 04/16/2014 1226   CL 103 04/16/2014 1226   CO2 23 06/17/2014 0936   CO2 32 04/16/2014 1226   BUN 14.6 06/17/2014 0936   BUN 15 04/16/2014 1226   CREATININE 0.9 06/17/2014 0936   CREATININE 0.96 04/16/2014 1226      Component Value Date/Time   CALCIUM 8.9 06/17/2014 0936   CALCIUM 9.9 04/16/2014 1226   ALKPHOS 86 06/17/2014 0936   ALKPHOS 41 08/22/2013 0758   AST 18 06/17/2014 0936   AST 23 08/22/2013 0758   ALT 24 06/17/2014 0936   ALT 17 08/22/2013 0758   BILITOT 0.55  06/17/2014 0936   BILITOT 0.8 08/22/2013 0758    I reviewed his peripheral smear myself and with the pathologist. Absolute leukopenia and thrombocytopenia are noted. They are reactive large lymphocyte without any signs of increased blasts.  ASSESSMENT & PLAN:  AML (acute myeloid leukemia) in remission The recent pancytopenia could be related to recent viral infection. I have reviewed his peripheral smear closely with the pathologist. There were no increased blasts seen. Some large reactive lymphocytes were seen with absolute thrombocytopenia. I have discussed this case with his physician at [facility]. She recommend close monitoring of CBC on a weekly basis. He has appointment to go back to [facility] in 3 weeks and his physician does not think he needs to return sooner. I will see him back a month from now.   Leukopenia due to antineoplastic chemotherapy He has new onset leukopenia, could be related to viral illness. As above, we will continue weekly CBC monitoring.   Thrombocytopenia due to drugs  This is new, could be related to viral illness versus recurrence of disease. In the meantime, he is not symptomatic. Continue close observation with weekly CBC   Upper respiratory tract infection His symptoms are improving. At this point, I do not recommend antibiotic therapy and continue observation and conservative management with over-the-counter antihistamines for decongestions.    Orders Placed This Encounter  Procedures  . Comprehensive metabolic panel    Standing Status: Standing     Number of Occurrences: 9     Standing Expiration Date: 06/17/2015   All questions were answered. The patient knows to call the clinic with any problems, questions or concerns. No barriers to learning was detected. I spent 30 minutes counseling the patient face to face. The total time spent in the appointment was 40 minutes and more than 50% was on counseling and review of test results      [facility], [place], MD 06/18/2014 9:54 AM

## 2014-06-18 NOTE — Telephone Encounter (Signed)
Faxed pt medical records to [facility]

## 2014-06-24 ENCOUNTER — Other Ambulatory Visit (HOSPITAL_BASED_OUTPATIENT_CLINIC_OR_DEPARTMENT_OTHER): Payer: Managed Care, Other (non HMO)

## 2014-06-24 DIAGNOSIS — C9201 Acute myeloblastic leukemia, in remission: Secondary | ICD-10-CM | POA: Diagnosis not present

## 2014-06-24 LAB — COMPREHENSIVE METABOLIC PANEL (CC13)
ALT: 24 U/L (ref 0–55)
AST: 22 U/L (ref 5–34)
Albumin: 3.6 g/dL (ref 3.5–5.0)
Alkaline Phosphatase: 89 U/L (ref 40–150)
Anion Gap: 9 mEq/L (ref 3–11)
BILIRUBIN TOTAL: 0.68 mg/dL (ref 0.20–1.20)
BUN: 11.6 mg/dL (ref 7.0–26.0)
CALCIUM: 9.1 mg/dL (ref 8.4–10.4)
CHLORIDE: 105 meq/L (ref 98–109)
CO2: 27 mEq/L (ref 22–29)
Creatinine: 0.9 mg/dL (ref 0.7–1.3)
EGFR: 86 mL/min/{1.73_m2} — ABNORMAL LOW (ref >90)
Glucose: 151 mg/dl — ABNORMAL HIGH (ref 70–140)
Potassium: 4.7 mEq/L (ref 3.5–5.1)
Sodium: 142 mEq/L (ref 136–145)
Total Protein: 6.5 g/dL (ref 6.4–8.3)

## 2014-06-24 LAB — CBC WITH DIFFERENTIAL/PLATELET
BASO%: 0.3 % (ref 0.0–2.0)
Basophils Absolute: 0 10*3/uL (ref 0.0–0.1)
EOS ABS: 0.1 10*3/uL (ref 0.0–0.5)
EOS%: 2 % (ref 0.0–7.0)
HCT: 39.2 % (ref 38.4–49.9)
HGB: 13.7 g/dL (ref 13.0–17.1)
LYMPH#: 0.8 10*3/uL — AB (ref 0.9–3.3)
LYMPH%: 27.3 % (ref 14.0–49.0)
MCH: 34.9 pg — ABNORMAL HIGH (ref 27.2–33.4)
MCHC: 34.9 g/dL (ref 32.0–36.0)
MCV: 99.7 fL — ABNORMAL HIGH (ref 79.3–98.0)
MONO#: 0.3 10*3/uL (ref 0.1–0.9)
MONO%: 10.1 % (ref 0.0–14.0)
NEUT%: 60.3 % (ref 39.0–75.0)
NEUTROS ABS: 1.8 10*3/uL (ref 1.5–6.5)
PLATELETS: 85 10*3/uL — AB (ref 140–400)
RBC: 3.93 10*6/uL — AB (ref 4.20–5.82)
RDW: 14.1 % (ref 11.0–14.6)
WBC: 3 10*3/uL — AB (ref 4.0–10.3)

## 2014-07-01 ENCOUNTER — Other Ambulatory Visit (HOSPITAL_BASED_OUTPATIENT_CLINIC_OR_DEPARTMENT_OTHER): Payer: Managed Care, Other (non HMO)

## 2014-07-01 ENCOUNTER — Telehealth: Payer: Self-pay | Admitting: *Deleted

## 2014-07-01 DIAGNOSIS — C9201 Acute myeloblastic leukemia, in remission: Secondary | ICD-10-CM | POA: Diagnosis not present

## 2014-07-01 LAB — CBC WITH DIFFERENTIAL/PLATELET
BASO%: 0.6 % (ref 0.0–2.0)
Basophils Absolute: 0 10*3/uL (ref 0.0–0.1)
EOS%: 1.8 % (ref 0.0–7.0)
Eosinophils Absolute: 0.1 10*3/uL (ref 0.0–0.5)
HEMATOCRIT: 41.6 % (ref 38.4–49.9)
HEMOGLOBIN: 14.2 g/dL (ref 13.0–17.1)
LYMPH%: 21.3 % (ref 14.0–49.0)
MCH: 34.5 pg — AB (ref 27.2–33.4)
MCHC: 34 g/dL (ref 32.0–36.0)
MCV: 101.3 fL — AB (ref 79.3–98.0)
MONO#: 0.3 10*3/uL (ref 0.1–0.9)
MONO%: 8.6 % (ref 0.0–14.0)
NEUT%: 67.7 % (ref 39.0–75.0)
NEUTROS ABS: 2 10*3/uL (ref 1.5–6.5)
Platelets: 103 10*3/uL — ABNORMAL LOW (ref 140–400)
RBC: 4.11 10*6/uL — ABNORMAL LOW (ref 4.20–5.82)
RDW: 14.8 % — ABNORMAL HIGH (ref 11.0–14.6)
WBC: 3 10*3/uL — ABNORMAL LOW (ref 4.0–10.3)
lymph#: 0.6 10*3/uL — ABNORMAL LOW (ref 0.9–3.3)

## 2014-07-01 LAB — COMPREHENSIVE METABOLIC PANEL (CC13)
ALK PHOS: 86 U/L (ref 40–150)
ALT: 28 U/L (ref 0–55)
AST: 24 U/L (ref 5–34)
Albumin: 3.7 g/dL (ref 3.5–5.0)
Anion Gap: 11 mEq/L (ref 3–11)
BUN: 16.3 mg/dL (ref 7.0–26.0)
CO2: 23 mEq/L (ref 22–29)
CREATININE: 1 mg/dL (ref 0.7–1.3)
Calcium: 9.1 mg/dL (ref 8.4–10.4)
Chloride: 104 mEq/L (ref 98–109)
EGFR: 77 mL/min/{1.73_m2} — ABNORMAL LOW (ref >90)
Glucose: 271 mg/dl — ABNORMAL HIGH (ref 70–140)
POTASSIUM: 4 meq/L (ref 3.5–5.1)
Sodium: 138 mEq/L (ref 136–145)
TOTAL PROTEIN: 6.8 g/dL (ref 6.4–8.3)
Total Bilirubin: 0.63 mg/dL (ref 0.20–1.20)

## 2014-07-01 NOTE — Telephone Encounter (Signed)
Informed pt of lab results improving and faxed to Dr. Nadara Mustard at [facility] (fax [phone]) pt verbalized understanding.

## 2014-07-01 NOTE — Telephone Encounter (Signed)
-----   Message from Heath Lark, MD sent at 07/01/2014 10:32 AM EDT ----- Regarding: counts better   ----- Message -----    From: [facility] Interface    Sent: 07/01/2014   9:44 AM      To: Heath Lark, MD

## 2014-07-10 ENCOUNTER — Telehealth: Payer: Self-pay | Admitting: Hematology and Oncology

## 2014-07-10 NOTE — Telephone Encounter (Signed)
pt came by to get copy of sch of labs-adv nothing on chart for orders from [facility]-tl[facility] w./Tammi(nurse) and she stated nothing recieved-adv pt to call back on 5/31-pt understood

## 2014-07-15 ENCOUNTER — Telehealth: Payer: Self-pay | Admitting: *Deleted

## 2014-07-15 ENCOUNTER — Other Ambulatory Visit: Payer: Self-pay | Admitting: Hematology and Oncology

## 2014-07-15 NOTE — Telephone Encounter (Signed)
Received orders from [facility] yesterday afternoon for labs on 6/9, 6/28 and then monthly x 2.  They were faxed to our lab dept last week and brought over to RN desk on 07/14/14.   Given to Dr. Alvy Bimler for review.   Pt called RN to ask about these orders and schedule.  Informed him we have received orders and to expect a call from [facility] with a lab time for 6/9.  He is already scheduled for lab/MD on  6/28.

## 2014-07-16 ENCOUNTER — Telehealth: Payer: Self-pay | Admitting: Hematology and Oncology

## 2014-07-16 NOTE — Telephone Encounter (Signed)
Mailed schedule to pt confirming labs only per 06/01 POF.... Cherylann Banas

## 2014-07-20 ENCOUNTER — Other Ambulatory Visit: Payer: Self-pay | Admitting: *Deleted

## 2014-07-23 ENCOUNTER — Other Ambulatory Visit (HOSPITAL_BASED_OUTPATIENT_CLINIC_OR_DEPARTMENT_OTHER): Payer: Managed Care, Other (non HMO)

## 2014-07-23 DIAGNOSIS — C9201 Acute myeloblastic leukemia, in remission: Secondary | ICD-10-CM | POA: Diagnosis not present

## 2014-07-23 LAB — CBC WITH DIFFERENTIAL/PLATELET
BASO%: 0 % (ref 0.0–2.0)
Basophils Absolute: 0 10*3/uL (ref 0.0–0.1)
EOS%: 0.7 % (ref 0.0–7.0)
Eosinophils Absolute: 0 10*3/uL (ref 0.0–0.5)
HCT: 41.9 % (ref 38.4–49.9)
HGB: 14.9 g/dL (ref 13.0–17.1)
LYMPH%: 22.1 % (ref 14.0–49.0)
MCH: 35 pg — ABNORMAL HIGH (ref 27.2–33.4)
MCHC: 35.6 g/dL (ref 32.0–36.0)
MCV: 98.4 fL — ABNORMAL HIGH (ref 79.3–98.0)
MONO#: 0.3 10*3/uL (ref 0.1–0.9)
MONO%: 8.7 % (ref 0.0–14.0)
NEUT#: 2.1 10*3/uL (ref 1.5–6.5)
NEUT%: 68.5 % (ref 39.0–75.0)
Platelets: 106 10*3/uL — ABNORMAL LOW (ref 140–400)
RBC: 4.26 10*6/uL (ref 4.20–5.82)
RDW: 13.5 % (ref 11.0–14.6)
WBC: 3 10*3/uL — AB (ref 4.0–10.3)
lymph#: 0.7 10*3/uL — ABNORMAL LOW (ref 0.9–3.3)

## 2014-07-23 LAB — COMPREHENSIVE METABOLIC PANEL (CC13)
ALBUMIN: 3.8 g/dL (ref 3.5–5.0)
ALT: 26 U/L (ref 0–55)
AST: 21 U/L (ref 5–34)
Alkaline Phosphatase: 89 U/L (ref 40–150)
Anion Gap: 8 mEq/L (ref 3–11)
BUN: 15.9 mg/dL (ref 7.0–26.0)
CALCIUM: 9.3 mg/dL (ref 8.4–10.4)
CO2: 26 mEq/L (ref 22–29)
Chloride: 104 mEq/L (ref 98–109)
Creatinine: 0.8 mg/dL (ref 0.7–1.3)
EGFR: 88 mL/min/{1.73_m2} — ABNORMAL LOW (ref >90)
Glucose: 161 mg/dl — ABNORMAL HIGH (ref 70–140)
Potassium: 4 mEq/L (ref 3.5–5.1)
Sodium: 138 mEq/L (ref 136–145)
Total Bilirubin: 0.66 mg/dL (ref 0.20–1.20)
Total Protein: 6.8 g/dL (ref 6.4–8.3)

## 2014-07-24 ENCOUNTER — Other Ambulatory Visit: Payer: Self-pay | Admitting: *Deleted

## 2014-07-24 ENCOUNTER — Telehealth: Payer: Self-pay | Admitting: *Deleted

## 2014-07-24 DIAGNOSIS — C9201 Acute myeloblastic leukemia, in remission: Secondary | ICD-10-CM

## 2014-07-24 NOTE — Telephone Encounter (Signed)
Labs faxed to [facility] 

## 2014-08-11 ENCOUNTER — Other Ambulatory Visit (HOSPITAL_BASED_OUTPATIENT_CLINIC_OR_DEPARTMENT_OTHER): Payer: Managed Care, Other (non HMO)

## 2014-08-11 ENCOUNTER — Ambulatory Visit (HOSPITAL_BASED_OUTPATIENT_CLINIC_OR_DEPARTMENT_OTHER): Payer: Managed Care, Other (non HMO) | Admitting: Hematology and Oncology

## 2014-08-11 ENCOUNTER — Telehealth: Payer: Self-pay | Admitting: Hematology and Oncology

## 2014-08-11 VITALS — BP 136/77 | HR 77 | Temp 98.8°F | Resp 18 | Ht 68.0 in | Wt 202.7 lb

## 2014-08-11 DIAGNOSIS — D701 Agranulocytosis secondary to cancer chemotherapy: Secondary | ICD-10-CM

## 2014-08-11 DIAGNOSIS — D61818 Other pancytopenia: Secondary | ICD-10-CM | POA: Diagnosis not present

## 2014-08-11 DIAGNOSIS — C9201 Acute myeloblastic leukemia, in remission: Secondary | ICD-10-CM | POA: Diagnosis not present

## 2014-08-11 DIAGNOSIS — D6959 Other secondary thrombocytopenia: Secondary | ICD-10-CM | POA: Diagnosis not present

## 2014-08-11 DIAGNOSIS — T451X5A Adverse effect of antineoplastic and immunosuppressive drugs, initial encounter: Secondary | ICD-10-CM

## 2014-08-11 DIAGNOSIS — T50905A Adverse effect of unspecified drugs, medicaments and biological substances, initial encounter: Secondary | ICD-10-CM

## 2014-08-11 LAB — CBC WITH DIFFERENTIAL/PLATELET
BASO%: 0.4 % (ref 0.0–2.0)
Basophils Absolute: 0 10*3/uL (ref 0.0–0.1)
EOS%: 1.3 % (ref 0.0–7.0)
Eosinophils Absolute: 0 10*3/uL (ref 0.0–0.5)
HCT: 42.5 % (ref 38.4–49.9)
HEMOGLOBIN: 14.5 g/dL (ref 13.0–17.1)
LYMPH%: 16.8 % (ref 14.0–49.0)
MCH: 34.3 pg — AB (ref 27.2–33.4)
MCHC: 34.1 g/dL (ref 32.0–36.0)
MCV: 100.6 fL — ABNORMAL HIGH (ref 79.3–98.0)
MONO#: 0.3 10*3/uL (ref 0.1–0.9)
MONO%: 7.8 % (ref 0.0–14.0)
NEUT#: 2.4 10*3/uL (ref 1.5–6.5)
NEUT%: 73.7 % (ref 39.0–75.0)
Platelets: 116 10*3/uL — ABNORMAL LOW (ref 140–400)
RBC: 4.22 10*6/uL (ref 4.20–5.82)
RDW: 14.5 % (ref 11.0–14.6)
WBC: 3.3 10*3/uL — ABNORMAL LOW (ref 4.0–10.3)
lymph#: 0.6 10*3/uL — ABNORMAL LOW (ref 0.9–3.3)

## 2014-08-11 LAB — COMPREHENSIVE METABOLIC PANEL (CC13)
ALBUMIN: 3.6 g/dL (ref 3.5–5.0)
ALT: 28 U/L (ref 0–55)
AST: 20 U/L (ref 5–34)
Alkaline Phosphatase: 85 U/L (ref 40–150)
Anion Gap: 7 mEq/L (ref 3–11)
BUN: 18.3 mg/dL (ref 7.0–26.0)
CALCIUM: 9.2 mg/dL (ref 8.4–10.4)
CHLORIDE: 106 meq/L (ref 98–109)
CO2: 28 mEq/L (ref 22–29)
Creatinine: 1 mg/dL (ref 0.7–1.3)
EGFR: 78 mL/min/{1.73_m2} — ABNORMAL LOW (ref >90)
Glucose: 260 mg/dl — ABNORMAL HIGH (ref 70–140)
POTASSIUM: 4 meq/L (ref 3.5–5.1)
Sodium: 140 mEq/L (ref 136–145)
Total Bilirubin: 0.67 mg/dL (ref 0.20–1.20)
Total Protein: 6.5 g/dL (ref 6.4–8.3)

## 2014-08-11 NOTE — Assessment & Plan Note (Signed)
The recent pancytopenia could be related to recent viral infection. He is not symptomatic. Continue monthly blood work. After his appointment at [facility] [place] [facility] in August, he will contact me about future follow-up.

## 2014-08-11 NOTE — Progress Notes (Signed)
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, NPM1 positive   09/17/2013 -  Chemotherapy He was given induction chemotherapy at [facility] (7+3)   10/01/2013 -  Chemotherapy He had repeat induction chemotherapy due to persistent disease (5+2)   11/06/2013 Bone Marrow Biopsy Bone marrow biopsy confirmed remission   11/13/2013 - 11/17/2013 Chemotherapy He received cycle 1 of consolidation chemotherapy with HiDAC   12/18/2013 - 12/22/2013 Chemotherapy He received cycle 2 of consolidation chemotherapy with HiDAC   01/23/2014 - 01/28/2014 Chemotherapy he received cycle 3 of consolidation chemotherapy with HiDAC   03/05/2014 - 03/10/2014 Hospital Admission he received cycle 4 of consolidation chemotherapy with HiDAC    INTERVAL HISTORY: Please see below for problem oriented charting. He feels well. Denies recent infection.  REVIEW OF SYSTEMS:   Constitutional: Denies fevers, chills or abnormal weight loss Eyes: Denies blurriness of vision Ears, nose, mouth, throat, and face: Denies mucositis or sore throat Respiratory: Denies cough, dyspnea or wheezes Cardiovascular: Denies palpitation, chest discomfort or lower extremity swelling Gastrointestinal:  Denies nausea, heartburn or change in bowel habits Skin: Denies abnormal skin rashes Lymphatics: Denies new lymphadenopathy or easy bruising Neurological:Denies numbness, tingling or new weaknesses Behavioral/Psych: Mood is stable, no new changes  All other systems were reviewed with the patient and are negative.  I have reviewed the past medical history, past surgical history, social history and family history with the patient and they are unchanged from previous note.  ALLERGIES:  is allergic to morphine and related.  MEDICATIONS:   Current Outpatient Prescriptions  Medication Sig Dispense Refill  . atenolol (TENORMIN) 25 MG tablet Take 12.5 mg by mouth daily.    Marland Kitchen glucose blood (FREESTYLE LITE) test strip Test once daily dx 250.00 100 each 3  . insulin lispro protamine-lispro (HUMALOG 75/25 MIX) (75-25) 100 UNIT/ML SUSP injection Inject 15 Units into the skin at bedtime. 3 vial 11  . INSULIN SYRINGE .5CC/29G (B-D INSULIN SYRINGE) 29G X 1/2" 0.5 ML MISC Use once daily, dx 250.00 100 each 3  . metFORMIN (GLUCOPHAGE) 500 MG tablet Take 1 tablet (500 mg total) by mouth daily with breakfast. (Patient taking differently: Take 500 mg by mouth daily with supper. ) 100 tablet 3  . simvastatin (ZOCOR) 20 MG tablet Take 1 tablet (20 mg total) by mouth at bedtime. 100 tablet 3   No current facility-administered medications for this visit.    PHYSICAL EXAMINATION: ECOG PERFORMANCE STATUS: 0 - Asymptomatic  Filed Vitals:   08/11/14 0927  BP: 136/77  Pulse: 77  Temp: 98.8 F (37.1 C)  Resp: 18   Filed Weights   08/11/14 0927  Weight: 202 lb 11.2 oz (91.944 kg)    GENERAL:alert, no distress and comfortable SKIN: skin color, texture, turgor are normal, no rashes or significant lesions EYES: normal, Conjunctiva are pink and non-injected, sclera clear OROPHARYNX:no exudate, no erythema and lips, buccal mucosa, and tongue normal  NECK: supple, thyroid normal size, non-tender, without nodularity LYMPH:  no palpable lymphadenopathy in the cervical, axillary or inguinal LUNGS: clear to auscultation and percussion with normal breathing effort HEART: regular rate & rhythm with soft systolic murmur on the left border, and no lower extremity edema ABDOMEN:abdomen soft, non-tender and normal bowel sounds Musculoskeletal:no cyanosis of digits and no clubbing  NEURO: alert &  oriented x 3 with fluent speech, no focal motor/sensory deficits  LABORATORY DATA:  I have reviewed the data as listed    Component Value Date/Time   NA  140 08/11/2014 0905   NA 140 04/16/2014 1226   K 4.0 08/11/2014 0905   K 4.5 04/16/2014 1226   CL 103 04/16/2014 1226   CO2 28 08/11/2014 0905   CO2 32 04/16/2014 1226   GLUCOSE 260* 08/11/2014 0905   GLUCOSE 132* 04/16/2014 1226   BUN 18.3 08/11/2014 0905   BUN 15 04/16/2014 1226   CREATININE 1.0 08/11/2014 0905   CREATININE 0.96 04/16/2014 1226   CALCIUM 9.2 08/11/2014 0905   CALCIUM 9.9 04/16/2014 1226   PROT 6.5 08/11/2014 0905   PROT 6.6 08/22/2013 0758   ALBUMIN 3.6 08/11/2014 0905   ALBUMIN 3.8 08/22/2013 0758   AST 20 08/11/2014 0905   AST 23 08/22/2013 0758   ALT 28 08/11/2014 0905   ALT 17 08/22/2013 0758   ALKPHOS 85 08/11/2014 0905   ALKPHOS 41 08/22/2013 0758   BILITOT 0.67 08/11/2014 0905   BILITOT 0.8 08/22/2013 0758   GFRNONAA 71.09 01/24/2010 0753   GFRAA 78 01/20/2008 0755    No results found for: SPEP, UPEP  Lab Results  Component Value Date   WBC 3.3* 08/11/2014   NEUTROABS 2.4 08/11/2014   HGB 14.5 08/11/2014   HCT 42.5 08/11/2014   MCV 100.6* 08/11/2014   PLT 116* 08/11/2014      Chemistry      Component Value Date/Time   NA 140 08/11/2014 0905   NA 140 04/16/2014 1226   K 4.0 08/11/2014 0905   K 4.5 04/16/2014 1226   CL 103 04/16/2014 1226   CO2 28 08/11/2014 0905   CO2 32 04/16/2014 1226   BUN 18.3 08/11/2014 0905   BUN 15 04/16/2014 1226   CREATININE 1.0 08/11/2014 0905   CREATININE 0.96 04/16/2014 1226      Component Value Date/Time   CALCIUM 9.2 08/11/2014 0905   CALCIUM 9.9 04/16/2014 1226   ALKPHOS 85 08/11/2014 0905   ALKPHOS 41 08/22/2013 0758   AST 20 08/11/2014 0905   AST 23 08/22/2013 0758   ALT 28 08/11/2014 0905   ALT 17 08/22/2013 0758   BILITOT 0.67 08/11/2014 0905   BILITOT 0.8 08/22/2013 0758      ASSESSMENT & PLAN:  AML (acute myeloid leukemia) in remission The recent pancytopenia could be related to recent viral infection. He is not symptomatic. Continue monthly blood work. After his appointment at  [facility] in August, he will contact me about future follow-up.   Leukopenia due to antineoplastic chemotherapy He has new onset leukopenia, could be related to viral illness. This is improving. Per recommendation from [facility], I will order methylmalonic acid and vitamin B-12 level. If his blood tests come back suspicious for vitamin B-12 deficiency, I will start him on vitamin B-12 supplement.     Thrombocytopenia due to drugs This is new, could be related to viral illness versus delayed bone marrow recovery. He is not symptomatic. The levels are improving. Continue observation.   No orders of the defined types were placed in this encounter.   All questions were answered. The patient knows to call the clinic with any problems, questions or concerns. No barriers to learning was detected. I spent 15 minutes counseling the patient face to face. The total time spent in the appointment was 20 minutes and more than 50% was on counseling and review of test  results     [place], [place], MD 08/11/2014 10:46 AM

## 2014-08-11 NOTE — Assessment & Plan Note (Signed)
This is new, could be related to viral illness versus delayed bone marrow recovery. He is not symptomatic. The levels are improving. Continue observation.

## 2014-08-11 NOTE — Telephone Encounter (Signed)
per pof to sch pt appt-gave pt copy of avs °

## 2014-08-11 NOTE — Assessment & Plan Note (Signed)
He has new onset leukopenia, could be related to viral illness. This is improving. Per recommendation from [facility], I will order methylmalonic acid and vitamin B-12 level. If his blood tests come back suspicious for vitamin B-12 deficiency, I will start him on vitamin B-12 supplement.

## 2014-08-13 LAB — VITAMIN B12: Vitamin B-12: 336 pg/mL (ref 211–911)

## 2014-08-13 LAB — METHYLMALONIC ACID, SERUM: Methylmalonic Acid, Quant: 153 nmol/L (ref 87–318)

## 2014-08-14 ENCOUNTER — Encounter: Payer: Self-pay | Admitting: *Deleted

## 2014-08-14 NOTE — Progress Notes (Signed)
Faxed labs to [facility]

## 2014-08-25 ENCOUNTER — Telehealth: Payer: Self-pay | Admitting: *Deleted

## 2014-08-25 NOTE — Telephone Encounter (Signed)
Pt came in today for copy of lab results (vitamin B12).  Results given to pt and also faxed to [facility] for Dr. Nadara Mustard.  Informed pt Vit B12 wnl and no need for any medication.  He verbalized understanding.

## 2014-09-02 ENCOUNTER — Telehealth: Payer: Self-pay | Admitting: *Deleted

## 2014-09-02 NOTE — Telephone Encounter (Signed)
Pt requests lab/flush appt on 7/26 be moved from am to afternoon at 2 pm or later.  POF sent to scheduler.

## 2014-09-03 ENCOUNTER — Telehealth: Payer: Self-pay | Admitting: *Deleted

## 2014-09-03 NOTE — Telephone Encounter (Signed)
Informed pt of lab/flush appt move to 2:15 pm on Tues 7/26 as requested.  He verbalized understanding.

## 2014-09-04 ENCOUNTER — Encounter: Payer: Self-pay | Admitting: Internal Medicine

## 2014-09-08 ENCOUNTER — Other Ambulatory Visit: Payer: Managed Care, Other (non HMO)

## 2014-09-08 ENCOUNTER — Other Ambulatory Visit (HOSPITAL_BASED_OUTPATIENT_CLINIC_OR_DEPARTMENT_OTHER): Payer: Managed Care, Other (non HMO)

## 2014-09-08 ENCOUNTER — Telehealth: Payer: Self-pay | Admitting: *Deleted

## 2014-09-08 ENCOUNTER — Ambulatory Visit (HOSPITAL_BASED_OUTPATIENT_CLINIC_OR_DEPARTMENT_OTHER): Payer: Managed Care, Other (non HMO)

## 2014-09-08 VITALS — BP 140/63 | HR 82 | Temp 98.3°F

## 2014-09-08 DIAGNOSIS — Z95828 Presence of other vascular implants and grafts: Secondary | ICD-10-CM

## 2014-09-08 DIAGNOSIS — C9201 Acute myeloblastic leukemia, in remission: Secondary | ICD-10-CM

## 2014-09-08 LAB — CBC WITH DIFFERENTIAL/PLATELET
BASO%: 0.6 % (ref 0.0–2.0)
Basophils Absolute: 0 10*3/uL (ref 0.0–0.1)
EOS%: 1.1 % (ref 0.0–7.0)
Eosinophils Absolute: 0 10*3/uL (ref 0.0–0.5)
HCT: 41.5 % (ref 38.4–49.9)
HEMOGLOBIN: 14.2 g/dL (ref 13.0–17.1)
LYMPH#: 0.6 10*3/uL — AB (ref 0.9–3.3)
LYMPH%: 15.3 % (ref 14.0–49.0)
MCH: 34.2 pg — AB (ref 27.2–33.4)
MCHC: 34.2 g/dL (ref 32.0–36.0)
MCV: 100 fL — AB (ref 79.3–98.0)
MONO#: 0.4 10*3/uL (ref 0.1–0.9)
MONO%: 10 % (ref 0.0–14.0)
NEUT%: 73 % (ref 39.0–75.0)
NEUTROS ABS: 2.9 10*3/uL (ref 1.5–6.5)
Platelets: 128 10*3/uL — ABNORMAL LOW (ref 140–400)
RBC: 4.15 10*6/uL — ABNORMAL LOW (ref 4.20–5.82)
RDW: 14.3 % (ref 11.0–14.6)
WBC: 4 10*3/uL (ref 4.0–10.3)

## 2014-09-08 LAB — COMPREHENSIVE METABOLIC PANEL (CC13)
ALBUMIN: 3.9 g/dL (ref 3.5–5.0)
ALT: 22 U/L (ref 0–55)
AST: 22 U/L (ref 5–34)
Alkaline Phosphatase: 90 U/L (ref 40–150)
Anion Gap: 10 mEq/L (ref 3–11)
BUN: 13.5 mg/dL (ref 7.0–26.0)
CO2: 24 mEq/L (ref 22–29)
Calcium: 9.5 mg/dL (ref 8.4–10.4)
Chloride: 106 mEq/L (ref 98–109)
Creatinine: 0.9 mg/dL (ref 0.7–1.3)
EGFR: 84 mL/min/{1.73_m2} — ABNORMAL LOW (ref >90)
GLUCOSE: 124 mg/dL (ref 70–140)
POTASSIUM: 4 meq/L (ref 3.5–5.1)
Sodium: 141 mEq/L (ref 136–145)
Total Bilirubin: 0.93 mg/dL (ref 0.20–1.20)
Total Protein: 6.9 g/dL (ref 6.4–8.3)

## 2014-09-08 MED ORDER — HEPARIN SOD (PORK) LOCK FLUSH 100 UNIT/ML IV SOLN
500.0000 [IU] | Freq: Once | INTRAVENOUS | Status: AC
Start: 2014-09-08 — End: 2014-09-08
  Administered 2014-09-08: 500 [IU] via INTRAVENOUS
  Filled 2014-09-08: qty 5

## 2014-09-08 MED ORDER — SODIUM CHLORIDE 0.9 % IJ SOLN
10.0000 mL | INTRAMUSCULAR | Status: DC | PRN
  Administered 2014-09-08: 10 mL via INTRAVENOUS
  Filled 2014-09-08: qty 10

## 2014-09-08 NOTE — Telephone Encounter (Signed)
Pt notified of message below. Labs faxed to [facility] 

## 2014-09-08 NOTE — Patient Instructions (Signed)

## 2014-09-08 NOTE — Telephone Encounter (Signed)
-----   Message from Heath Lark, MD sent at 09/08/2014  2:38 PM EDT ----- Regarding: labs OK Please let him know and fax a copy to [facility] ----- Message -----    From: Lab in [facility] Interface    Sent: 09/08/2014   2:00 PM      To: Heath Lark, MD

## 2014-09-09 ENCOUNTER — Other Ambulatory Visit: Payer: Managed Care, Other (non HMO)

## 2014-09-24 ENCOUNTER — Telehealth: Payer: Self-pay | Admitting: *Deleted

## 2014-09-24 ENCOUNTER — Other Ambulatory Visit: Payer: Self-pay | Admitting: Hematology and Oncology

## 2014-09-24 NOTE — Telephone Encounter (Signed)
Orders received from [facility], Dr. Nadara Mustard, requesting labs here on 10/08/14 for CBC and CMET.   Order placed on Dr. Calton Dach desk for review.

## 2014-09-24 NOTE — Telephone Encounter (Signed)
I placed POF for labs appt, no need to see him We can fax results once they are available

## 2014-09-25 ENCOUNTER — Other Ambulatory Visit: Payer: Self-pay | Admitting: Hematology and Oncology

## 2014-09-25 ENCOUNTER — Telehealth: Payer: Self-pay | Admitting: Hematology and Oncology

## 2014-09-25 NOTE — Telephone Encounter (Signed)
Spoke with patient and he is aware of his lab

## 2014-10-01 ENCOUNTER — Telehealth: Payer: Self-pay | Admitting: *Deleted

## 2014-10-01 NOTE — Telephone Encounter (Signed)
LVM informing pt of appt w/ Dr. Alvy Bimler on 8/25 added after lab appt.  No need to call back if this is ok.

## 2014-10-08 ENCOUNTER — Other Ambulatory Visit (HOSPITAL_BASED_OUTPATIENT_CLINIC_OR_DEPARTMENT_OTHER): Payer: Managed Care, Other (non HMO)

## 2014-10-08 ENCOUNTER — Ambulatory Visit (HOSPITAL_BASED_OUTPATIENT_CLINIC_OR_DEPARTMENT_OTHER): Payer: Managed Care, Other (non HMO) | Admitting: Hematology and Oncology

## 2014-10-08 ENCOUNTER — Telehealth: Payer: Self-pay | Admitting: Hematology and Oncology

## 2014-10-08 ENCOUNTER — Encounter: Payer: Self-pay | Admitting: Hematology and Oncology

## 2014-10-08 VITALS — BP 142/75 | HR 83 | Temp 99.1°F | Resp 18 | Ht 68.0 in | Wt 201.3 lb

## 2014-10-08 DIAGNOSIS — D6959 Other secondary thrombocytopenia: Secondary | ICD-10-CM

## 2014-10-08 DIAGNOSIS — T50905A Adverse effect of unspecified drugs, medicaments and biological substances, initial encounter: Secondary | ICD-10-CM

## 2014-10-08 DIAGNOSIS — C9201 Acute myeloblastic leukemia, in remission: Secondary | ICD-10-CM

## 2014-10-08 DIAGNOSIS — E663 Overweight: Secondary | ICD-10-CM | POA: Diagnosis not present

## 2014-10-08 LAB — COMPREHENSIVE METABOLIC PANEL (CC13)
ALT: 27 U/L (ref 0–55)
ANION GAP: 9 meq/L (ref 3–11)
AST: 21 U/L (ref 5–34)
Albumin: 3.9 g/dL (ref 3.5–5.0)
Alkaline Phosphatase: 89 U/L (ref 40–150)
BUN: 10 mg/dL (ref 7.0–26.0)
CHLORIDE: 108 meq/L (ref 98–109)
CO2: 27 meq/L (ref 22–29)
Calcium: 9.4 mg/dL (ref 8.4–10.4)
Creatinine: 1 mg/dL (ref 0.7–1.3)
EGFR: 78 mL/min/{1.73_m2} — AB (ref >90)
GLUCOSE: 158 mg/dL — AB (ref 70–140)
Potassium: 4.1 mEq/L (ref 3.5–5.1)
SODIUM: 144 meq/L (ref 136–145)
Total Bilirubin: 0.71 mg/dL (ref 0.20–1.20)
Total Protein: 6.8 g/dL (ref 6.4–8.3)

## 2014-10-08 LAB — CBC WITH DIFFERENTIAL/PLATELET
BASO%: 0.2 % (ref 0.0–2.0)
Basophils Absolute: 0 10*3/uL (ref 0.0–0.1)
EOS%: 0.5 % (ref 0.0–7.0)
Eosinophils Absolute: 0 10*3/uL (ref 0.0–0.5)
HCT: 42.5 % (ref 38.4–49.9)
HGB: 15 g/dL (ref 13.0–17.1)
LYMPH%: 18.2 % (ref 14.0–49.0)
MCH: 34.9 pg — ABNORMAL HIGH (ref 27.2–33.4)
MCHC: 35.3 g/dL (ref 32.0–36.0)
MCV: 98.8 fL — AB (ref 79.3–98.0)
MONO#: 0.3 10*3/uL (ref 0.1–0.9)
MONO%: 6.6 % (ref 0.0–14.0)
NEUT#: 3 10*3/uL (ref 1.5–6.5)
NEUT%: 74.5 % (ref 39.0–75.0)
PLATELETS: 122 10*3/uL — AB (ref 140–400)
RBC: 4.3 10*6/uL (ref 4.20–5.82)
RDW: 13.1 % (ref 11.0–14.6)
WBC: 4.1 10*3/uL (ref 4.0–10.3)
lymph#: 0.7 10*3/uL — ABNORMAL LOW (ref 0.9–3.3)

## 2014-10-08 NOTE — Assessment & Plan Note (Signed)
The patient is overweight. He is a diabetic. I recommended increase exercise as tolerated and the patient is motivated to lose 20 pounds in the near future. I continue to encourage him.

## 2014-10-08 NOTE — Assessment & Plan Note (Signed)
He is not symptomatic. His blood work confirmed the patient remained in remission I was felt his blood work to every other month and see him back in 3 months.

## 2014-10-08 NOTE — Telephone Encounter (Signed)
Pt confirmed labs/ov per 08/25 POF, gave pt AVS and Calendar... KJ °

## 2014-10-08 NOTE — Progress Notes (Signed)
[place] OFFICE PROGRESS NOTE  Patient Care Team: Dorena Cookey, MD as PCP - General  SUMMARY OF ONCOLOGIC HISTORY:   AML (acute myeloid leukemia) in remission   09/11/2013 Bone Marrow Biopsy Bone marrow biopsy showed AML, FLT3 negative, NPM1 positive   09/17/2013 -  Chemotherapy He was given induction chemotherapy at [facility] (7+3)   10/01/2013 -  Chemotherapy He had repeat induction chemotherapy due to persistent disease (5+2)   11/06/2013 Bone Marrow Biopsy Bone marrow biopsy confirmed remission   11/13/2013 - 11/17/2013 Chemotherapy He received cycle 1 of consolidation chemotherapy with HiDAC   12/18/2013 - 12/22/2013 Chemotherapy He received cycle 2 of consolidation chemotherapy with HiDAC   01/23/2014 - 01/28/2014 Chemotherapy he received cycle 3 of consolidation chemotherapy with HiDAC   03/05/2014 - 03/10/2014 Hospital Admission he received cycle 4 of consolidation chemotherapy with HiDAC    INTERVAL HISTORY: Please see below for problem oriented charting. He returns for further follow-up. The patient requested local follow-up because he took in a new job recently and it is closer to home. He denies any signs or symptoms of infection. The patient denies any recent signs or symptoms of bleeding such as spontaneous epistaxis, hematuria or hematochezia.   REVIEW OF SYSTEMS:   Constitutional: Denies fevers, chills or abnormal weight loss Eyes: Denies blurriness of vision Ears, nose, mouth, throat, and face: Denies mucositis or sore throat Respiratory: Denies cough, dyspnea or wheezes Cardiovascular: Denies palpitation, chest discomfort or lower extremity swelling Gastrointestinal:  Denies nausea, heartburn or change in bowel habits Skin: Denies abnormal skin rashes Lymphatics: Denies new lymphadenopathy or easy bruising Neurological:Denies numbness, tingling or new weaknesses Behavioral/Psych: Mood is stable, no new changes  All other systems were reviewed with  the patient and are negative.  I have reviewed the past medical history, past surgical history, social history and family history with the patient and they are unchanged from previous note.  ALLERGIES:  is allergic to morphine and related.  MEDICATIONS:  Current Outpatient Prescriptions  Medication Sig Dispense Refill  . atenolol (TENORMIN) 25 MG tablet Take 12.5 mg by mouth daily.    Marland Kitchen glucose blood (FREESTYLE LITE) test strip Test once daily dx 250.00 100 each 3  . insulin lispro protamine-lispro (HUMALOG 75/25 MIX) (75-25) 100 UNIT/ML SUSP injection Inject 15 Units into the skin at bedtime. 3 vial 11  . INSULIN SYRINGE .5CC/29G (B-D INSULIN SYRINGE) 29G X 1/2" 0.5 ML MISC Use once daily, dx 250.00 100 each 3  . metFORMIN (GLUCOPHAGE) 500 MG tablet Take 1 tablet (500 mg total) by mouth daily with breakfast. (Patient taking differently: Take 500 mg by mouth daily with supper. ) 100 tablet 3  . simvastatin (ZOCOR) 20 MG tablet Take 1 tablet (20 mg total) by mouth at bedtime. 100 tablet 3   No current facility-administered medications for this visit.    PHYSICAL EXAMINATION: ECOG PERFORMANCE STATUS: 0 - Asymptomatic  Filed Vitals:   10/08/14 1413  BP: 142/75  Pulse: 83  Temp: 99.1 F (37.3 C)  Resp: 18   Filed Weights   10/08/14 1413  Weight: 201 lb 4.8 oz (91.309 kg)    GENERAL:alert, no distress and comfortable. He is obese SKIN: skin color, texture, turgor are normal, no rashes or significant lesions EYES: normal, Conjunctiva are pink and non-injected, sclera clear OROPHARYNX:no exudate, no erythema and lips, buccal mucosa, and tongue normal  NECK: supple, thyroid normal size, non-tender, without nodularity LYMPH:  no palpable lymphadenopathy in the cervical, axillary  or inguinal LUNGS: clear to auscultation and percussion with normal breathing effort HEART: regular rate & rhythm  With soft systolic murmurs and no lower extremity edema ABDOMEN:abdomen soft, non-tender and  normal bowel sounds Musculoskeletal:no cyanosis of digits and no clubbing  NEURO: alert & oriented x 3 with fluent speech, no focal motor/sensory deficits  LABORATORY DATA:  I have reviewed the data as listed    Component Value Date/Time   NA 144 10/08/2014 1349   NA 140 04/16/2014 1226   K 4.1 10/08/2014 1349   K 4.5 04/16/2014 1226   CL 103 04/16/2014 1226   CO2 27 10/08/2014 1349   CO2 32 04/16/2014 1226   GLUCOSE 158* 10/08/2014 1349   GLUCOSE 132* 04/16/2014 1226   BUN 10.0 10/08/2014 1349   BUN 15 04/16/2014 1226   CREATININE 1.0 10/08/2014 1349   CREATININE 0.96 04/16/2014 1226   CALCIUM 9.4 10/08/2014 1349   CALCIUM 9.9 04/16/2014 1226   PROT 6.8 10/08/2014 1349   PROT 6.6 08/22/2013 0758   ALBUMIN 3.9 10/08/2014 1349   ALBUMIN 3.8 08/22/2013 0758   AST 21 10/08/2014 1349   AST 23 08/22/2013 0758   ALT 27 10/08/2014 1349   ALT 17 08/22/2013 0758   ALKPHOS 89 10/08/2014 1349   ALKPHOS 41 08/22/2013 0758   BILITOT 0.71 10/08/2014 1349   BILITOT 0.8 08/22/2013 0758   GFRNONAA 71.09 01/24/2010 0753   GFRAA 78 01/20/2008 0755    No results found for: SPEP, UPEP  Lab Results  Component Value Date   WBC 4.1 10/08/2014   NEUTROABS 3.0 10/08/2014   HGB 15.0 10/08/2014   HCT 42.5 10/08/2014   MCV 98.8* 10/08/2014   PLT 122* 10/08/2014      Chemistry      Component Value Date/Time   NA 144 10/08/2014 1349   NA 140 04/16/2014 1226   K 4.1 10/08/2014 1349   K 4.5 04/16/2014 1226   CL 103 04/16/2014 1226   CO2 27 10/08/2014 1349   CO2 32 04/16/2014 1226   BUN 10.0 10/08/2014 1349   BUN 15 04/16/2014 1226   CREATININE 1.0 10/08/2014 1349   CREATININE 0.96 04/16/2014 1226      Component Value Date/Time   CALCIUM 9.4 10/08/2014 1349   CALCIUM 9.9 04/16/2014 1226   ALKPHOS 89 10/08/2014 1349   ALKPHOS 41 08/22/2013 0758   AST 21 10/08/2014 1349   AST 23 08/22/2013 0758   ALT 27 10/08/2014 1349   ALT 17 08/22/2013 0758   BILITOT 0.71 10/08/2014 1349    BILITOT 0.8 08/22/2013 0758     ASSESSMENT & PLAN:  AML (acute myeloid leukemia) in remission He is not symptomatic. His blood work confirmed the patient remained in remission I was felt his blood work to every other month and see him back in 3 months.  Thrombocytopenia due to drugs  He has chronic thrombocytopenia which I suspect could be due to sequestration/fatty liver disease. He is not symptomatic. Observe only.  Overweight  The patient is overweight. He is a diabetic. I recommended increase exercise as tolerated and the patient is motivated to lose 20 pounds in the near future. I continue to encourage him.   No orders of the defined types were placed in this encounter.   All questions were answered. The patient knows to call the clinic with any problems, questions or concerns. No barriers to learning was detected. I spent 15 minutes counseling the patient face to face. The total time spent in  the appointment was 20 minutes and more than 50% was on counseling and review of test results     [facility], [place], MD 10/08/2014 4:31 PM

## 2014-10-08 NOTE — Assessment & Plan Note (Signed)
He has chronic thrombocytopenia which I suspect could be due to sequestration/fatty liver disease. He is not symptomatic. Observe only.

## 2014-10-13 ENCOUNTER — Other Ambulatory Visit: Payer: Managed Care, Other (non HMO)

## 2014-10-16 ENCOUNTER — Other Ambulatory Visit (INDEPENDENT_AMBULATORY_CARE_PROVIDER_SITE_OTHER): Payer: Managed Care, Other (non HMO)

## 2014-10-16 DIAGNOSIS — Z0000 Encounter for general adult medical examination without abnormal findings: Secondary | ICD-10-CM | POA: Diagnosis not present

## 2014-10-16 LAB — BASIC METABOLIC PANEL
BUN: 12 mg/dL (ref 6–23)
CALCIUM: 9.3 mg/dL (ref 8.4–10.5)
CO2: 32 mEq/L (ref 19–32)
Chloride: 106 mEq/L (ref 96–112)
Creatinine, Ser: 0.87 mg/dL (ref 0.40–1.50)
GFR: 91.9 mL/min (ref >60.00)
GLUCOSE: 164 mg/dL — AB (ref 70–99)
Potassium: 5.2 mEq/L — ABNORMAL HIGH (ref 3.5–5.1)
SODIUM: 145 meq/L (ref 135–145)

## 2014-10-16 LAB — CBC WITH DIFFERENTIAL/PLATELET
BASOS ABS: 0 10*3/uL (ref 0.0–0.1)
Basophils Relative: 0.2 % (ref 0.0–3.0)
EOS ABS: 0 10*3/uL (ref 0.0–0.7)
Eosinophils Relative: 0.9 % (ref 0.0–5.0)
HCT: 44.1 % (ref 39.0–52.0)
Hemoglobin: 15 g/dL (ref 13.0–17.0)
LYMPHS ABS: 0.7 10*3/uL (ref 0.7–4.0)
Lymphocytes Relative: 19.6 % (ref 12.0–46.0)
MCHC: 34 g/dL (ref 30.0–36.0)
MCV: 101.5 fl — ABNORMAL HIGH (ref 78.0–100.0)
MONO ABS: 0.3 10*3/uL (ref 0.1–1.0)
Monocytes Relative: 9.1 % (ref 3.0–12.0)
NEUTROS PCT: 70.2 % (ref 43.0–77.0)
Neutro Abs: 2.6 10*3/uL (ref 1.4–7.7)
Platelets: 132 10*3/uL — ABNORMAL LOW (ref 150.0–400.0)
RBC: 4.34 Mil/uL (ref 4.22–5.81)
RDW: 14.2 % (ref 11.5–15.5)
WBC: 3.7 10*3/uL — ABNORMAL LOW (ref 4.0–10.5)

## 2014-10-16 LAB — MICROALBUMIN / CREATININE URINE RATIO
Creatinine,U: 230.8 mg/dL
Microalb Creat Ratio: 1.7 mg/g (ref 0.0–30.0)
Microalb, Ur: 3.9 mg/dL — ABNORMAL HIGH (ref 0.0–1.9)

## 2014-10-16 LAB — POCT URINALYSIS DIPSTICK
Bilirubin, UA: NEGATIVE
Blood, UA: NEGATIVE
KETONES UA: NEGATIVE
Leukocytes, UA: NEGATIVE
Nitrite, UA: NEGATIVE
PH UA: 5.5
Urobilinogen, UA: 0.2

## 2014-10-16 LAB — HEPATIC FUNCTION PANEL
ALK PHOS: 77 U/L (ref 39–117)
ALT: 23 U/L (ref 0–53)
AST: 17 U/L (ref 0–37)
Albumin: 3.9 g/dL (ref 3.5–5.2)
BILIRUBIN TOTAL: 0.7 mg/dL (ref 0.2–1.2)
Bilirubin, Direct: 0.2 mg/dL (ref 0.0–0.3)
Total Protein: 6.4 g/dL (ref 6.0–8.3)

## 2014-10-16 LAB — TSH: TSH: 2.83 u[IU]/mL (ref 0.35–4.50)

## 2014-10-16 LAB — LIPID PANEL
CHOLESTEROL: 139 mg/dL (ref 0–200)
HDL: 49.1 mg/dL (ref >39.00)
LDL CALC: 74 mg/dL (ref 0–99)
NONHDL: 89.77
Total CHOL/HDL Ratio: 3
Triglycerides: 77 mg/dL (ref 0.0–149.0)
VLDL: 15.4 mg/dL (ref 0.0–40.0)

## 2014-10-16 LAB — HEMOGLOBIN A1C: HEMOGLOBIN A1C: 7.2 % — AB (ref 4.6–6.5)

## 2014-10-16 LAB — PSA: PSA: 2.87 ng/mL (ref 0.10–4.00)

## 2014-10-20 ENCOUNTER — Encounter: Payer: Self-pay | Admitting: Family Medicine

## 2014-10-20 ENCOUNTER — Ambulatory Visit (INDEPENDENT_AMBULATORY_CARE_PROVIDER_SITE_OTHER): Payer: Managed Care, Other (non HMO) | Admitting: Family Medicine

## 2014-10-20 VITALS — BP 140/80 | Temp 99.5°F | Ht 68.0 in | Wt 200.0 lb

## 2014-10-20 DIAGNOSIS — E109 Type 1 diabetes mellitus without complications: Secondary | ICD-10-CM | POA: Diagnosis not present

## 2014-10-20 DIAGNOSIS — I10 Essential (primary) hypertension: Secondary | ICD-10-CM

## 2014-10-20 DIAGNOSIS — E785 Hyperlipidemia, unspecified: Secondary | ICD-10-CM | POA: Diagnosis not present

## 2014-10-20 DIAGNOSIS — E139 Other specified diabetes mellitus without complications: Secondary | ICD-10-CM

## 2014-10-20 DIAGNOSIS — Z23 Encounter for immunization: Secondary | ICD-10-CM | POA: Diagnosis not present

## 2014-10-20 DIAGNOSIS — C9201 Acute myeloblastic leukemia, in remission: Secondary | ICD-10-CM

## 2014-10-20 MED ORDER — INSULIN LISPRO PROT & LISPRO (75-25 MIX) 100 UNIT/ML ~~LOC~~ SUSP: SUBCUTANEOUS | Status: DC

## 2014-10-20 MED ORDER — ATENOLOL 25 MG PO TABS: 12.5000 mg | ORAL_TABLET | Freq: Every day | ORAL | Status: DC

## 2014-10-20 MED ORDER — SIMVASTATIN 20 MG PO TABS: 20.0000 mg | ORAL_TABLET | Freq: Every day | ORAL | Status: DC

## 2014-10-20 MED ORDER — METFORMIN HCL 500 MG PO TABS: 500.0000 mg | ORAL_TABLET | Freq: Every day | ORAL | Status: DC

## 2014-10-20 NOTE — Progress Notes (Signed)
Pre visit review using our clinic review tool, if applicable. No additional management support is needed unless otherwise documented below in the visit note. 

## 2014-10-20 NOTE — Progress Notes (Signed)
   Subjective:    Patient ID: David Shadow., male    DOB: 22-Jun-1945, 69 y.o.   MRN: [id number]  HPI David Carr is a 69 year old married male nonsmoker who comes in today for general physical examination because of a history of mild hypertension on Tenormin 25 mg dose one half tab daily, diabetes type 2 however he cannot take anything more than 500 mg of metformin daily so we've added insulin 15 units daily. His blood sugar runs in the 120 to 1:30 range his A1c is 7.2%  He's currently followed on a quarterly basis at [facility] because of AML. He is in remission after his initial therapy.  He also takes Zocor 20 mg daily for hyperlipidemia  He gets routine eye care, dental care, colonoscopy and GI, vaccinations up-to-date  He's currently working although he switched and is doing a new job.    Review of Systems  Constitutional: Negative.   HENT: Negative.   Eyes: Negative.   Respiratory: Negative.   Cardiovascular: Negative.   Gastrointestinal: Negative.   Endocrine: Negative.   Genitourinary: Negative.   Musculoskeletal: Negative.   Skin: Negative.   Allergic/Immunologic: Negative.   Neurological: Negative.   Hematological: Negative.   Psychiatric/Behavioral: Negative.        Objective:   Physical Exam  Constitutional: He is oriented to person, place, and time. He appears well-developed and well-nourished.  HENT:  Head: Normocephalic and atraumatic.  Right Ear: External ear normal.  Left Ear: External ear normal.  Nose: Nose normal.  Mouth/Throat: Oropharynx is clear and moist.  Eyes: Conjunctivae and EOM are normal. Pupils are equal, round, and reactive to light.  Neck: Normal range of motion. Neck supple. No JVD present. No tracheal deviation present. No thyromegaly present.  Cardiovascular: Normal rate, regular rhythm and intact distal pulses.  Exam reveals no gallop and no friction rub.   No murmur heard. Grade 2/6 systolic ejection murmur consistent with  MR which she's been noted to have in the past.  No carotid nor aortic bruits peripheral pulses 2+ and symmetrical  Pulmonary/Chest: Effort normal and breath sounds normal. No stridor. No respiratory distress. He has no wheezes. He has no rales. He exhibits no tenderness.  Abdominal: Soft. Bowel sounds are normal. He exhibits no distension and no mass. There is no tenderness. There is no rebound and no guarding.  Genitourinary: Rectum normal, prostate normal and penis normal. Guaiac negative stool. No penile tenderness.  Musculoskeletal: Normal range of motion. He exhibits no edema or tenderness.  Lymphadenopathy:    He has no cervical adenopathy.  Neurological: He is alert and oriented to person, place, and time. He has normal reflexes. No cranial nerve deficit. He exhibits normal muscle tone.  Skin: Skin is warm and dry. No rash noted. No erythema. No pallor.  Scar right upper anterior chest wall from previous Port-A-Cath  Psychiatric: He has a normal mood and affect. His behavior is normal. Judgment and thought content normal.  Nursing note and vitals reviewed.         Assessment & Plan:   Healthy male  Mild hypertension continue Tenormin 12.5 mg daily  Diabetes type 1 continue metformin 500 mg daily increase insulin from 15-20 units follow-up A1c in 3 months  Hyperlipidemia continue Zocor and aspirin  History of AML currently remission follow-up at [facility] every 3 months

## 2014-10-20 NOTE — Patient Instructions (Signed)
Continue current medications.........Marland Kitchen except increase your insulin from 15 units daily to 20 units daily  Follow-up in 3 months  Nonfasting labs one week prior

## 2014-11-09 ENCOUNTER — Other Ambulatory Visit: Payer: Self-pay | Admitting: Family Medicine

## 2014-11-09 DIAGNOSIS — E785 Hyperlipidemia, unspecified: Secondary | ICD-10-CM

## 2014-11-09 DIAGNOSIS — E139 Other specified diabetes mellitus without complications: Secondary | ICD-10-CM

## 2014-11-09 NOTE — Telephone Encounter (Signed)
Patient wife can in this morning about the patient RXs. Patient states that Atenolol should be a 90 supply instead of 50, Humalog should be 90 days. Simvastin and metaformin was sent in for 100 day supply and insurance will only pay for a 90 day supply. Please sent Rx to home delivery and notified patient [phone] that the RXs have been sent in.

## 2014-11-10 MED ORDER — SIMVASTATIN 20 MG PO TABS: 20.0000 mg | ORAL_TABLET | Freq: Every day | ORAL | Status: DC

## 2014-11-10 MED ORDER — METFORMIN HCL 500 MG PO TABS: 500.0000 mg | ORAL_TABLET | Freq: Every day | ORAL | Status: DC

## 2014-11-10 MED ORDER — INSULIN LISPRO PROT & LISPRO (75-25 MIX) 100 UNIT/ML ~~LOC~~ SUSP: SUBCUTANEOUS | Status: DC

## 2014-11-10 MED ORDER — ATENOLOL 25 MG PO TABS: 12.5000 mg | ORAL_TABLET | Freq: Every day | ORAL | Status: DC

## 2014-11-10 NOTE — Telephone Encounter (Signed)
Called and spoke with pt and pt is aware that prescriptions were sent to [facility].  Discussed each medication with pt over the phone and verified # of pills and refills.

## 2014-12-09 ENCOUNTER — Ambulatory Visit (HOSPITAL_BASED_OUTPATIENT_CLINIC_OR_DEPARTMENT_OTHER): Payer: Managed Care, Other (non HMO)

## 2014-12-09 ENCOUNTER — Telehealth: Payer: Self-pay | Admitting: *Deleted

## 2014-12-09 ENCOUNTER — Other Ambulatory Visit (HOSPITAL_BASED_OUTPATIENT_CLINIC_OR_DEPARTMENT_OTHER): Payer: Managed Care, Other (non HMO)

## 2014-12-09 DIAGNOSIS — Z95828 Presence of other vascular implants and grafts: Secondary | ICD-10-CM

## 2014-12-09 DIAGNOSIS — C9201 Acute myeloblastic leukemia, in remission: Secondary | ICD-10-CM

## 2014-12-09 LAB — CBC WITH DIFFERENTIAL/PLATELET
BASO%: 0.4 % (ref 0.0–2.0)
BASOS ABS: 0 10*3/uL (ref 0.0–0.1)
EOS%: 0.5 % (ref 0.0–7.0)
Eosinophils Absolute: 0 10*3/uL (ref 0.0–0.5)
HCT: 44 % (ref 38.4–49.9)
HGB: 15 g/dL (ref 13.0–17.1)
LYMPH#: 0.8 10*3/uL — AB (ref 0.9–3.3)
LYMPH%: 18.8 % (ref 14.0–49.0)
MCH: 33.2 pg (ref 27.2–33.4)
MCHC: 34 g/dL (ref 32.0–36.0)
MCV: 97.7 fL (ref 79.3–98.0)
MONO#: 0.3 10*3/uL (ref 0.1–0.9)
MONO%: 8.6 % (ref 0.0–14.0)
NEUT#: 2.9 10*3/uL (ref 1.5–6.5)
NEUT%: 71.7 % (ref 39.0–75.0)
Platelets: 135 10*3/uL — ABNORMAL LOW (ref 140–400)
RBC: 4.5 10*6/uL (ref 4.20–5.82)
RDW: 13.8 % (ref 11.0–14.6)
WBC: 4 10*3/uL (ref 4.0–10.3)

## 2014-12-09 LAB — COMPREHENSIVE METABOLIC PANEL (CC13)
ALT: 24 U/L (ref 0–55)
ANION GAP: 7 meq/L (ref 3–11)
AST: 20 U/L (ref 5–34)
Albumin: 3.9 g/dL (ref 3.5–5.0)
Alkaline Phosphatase: 83 U/L (ref 40–150)
BUN: 15.4 mg/dL (ref 7.0–26.0)
CHLORIDE: 106 meq/L (ref 98–109)
CO2: 28 meq/L (ref 22–29)
CREATININE: 0.8 mg/dL (ref 0.7–1.3)
Calcium: 9.7 mg/dL (ref 8.4–10.4)
EGFR: 88 mL/min/{1.73_m2} — AB (ref >90)
Glucose: 108 mg/dl (ref 70–140)
POTASSIUM: 4.3 meq/L (ref 3.5–5.1)
SODIUM: 141 meq/L (ref 136–145)
Total Bilirubin: 0.8 mg/dL (ref 0.20–1.20)
Total Protein: 6.8 g/dL (ref 6.4–8.3)

## 2014-12-09 MED ORDER — SODIUM CHLORIDE 0.9 % IJ SOLN
10.0000 mL | INTRAMUSCULAR | Status: DC | PRN
  Administered 2014-12-09: 10 mL via INTRAVENOUS
  Filled 2014-12-09: qty 10

## 2014-12-09 MED ORDER — HEPARIN SOD (PORK) LOCK FLUSH 100 UNIT/ML IV SOLN
500.0000 [IU] | Freq: Once | INTRAVENOUS | Status: AC
  Administered 2014-12-09: 500 [IU] via INTRAVENOUS
  Filled 2014-12-09: qty 5

## 2014-12-09 NOTE — Patient Instructions (Signed)

## 2014-12-09 NOTE — Telephone Encounter (Signed)
-----   Message from Heath Lark, MD sent at 12/09/2014  2:39 PM EDT ----- Regarding: labs OK Pls let him know his CBC looks good ----- Message -----    From: [facility] in [facility] Interface    Sent: 12/09/2014   1:45 PM      To: Heath Lark, MD

## 2014-12-09 NOTE — Telephone Encounter (Signed)
Informed pt of CBC and CMET results good today.   Labs faxed to [facility].   Pt verbalized understanding.

## 2015-01-14 ENCOUNTER — Other Ambulatory Visit (INDEPENDENT_AMBULATORY_CARE_PROVIDER_SITE_OTHER): Payer: Managed Care, Other (non HMO)

## 2015-01-14 DIAGNOSIS — E109 Type 1 diabetes mellitus without complications: Secondary | ICD-10-CM

## 2015-01-14 LAB — BASIC METABOLIC PANEL
BUN: 15 mg/dL (ref 6–23)
CALCIUM: 9.4 mg/dL (ref 8.4–10.5)
CO2: 30 meq/L (ref 19–32)
Chloride: 104 mEq/L (ref 96–112)
Creatinine, Ser: 0.88 mg/dL (ref 0.40–1.50)
GFR: 90.63 mL/min (ref >60.00)
GLUCOSE: 128 mg/dL — AB (ref 70–99)
Potassium: 4.5 mEq/L (ref 3.5–5.1)
Sodium: 141 mEq/L (ref 135–145)

## 2015-01-14 LAB — HEMOGLOBIN A1C: Hgb A1c MFr Bld: 7.5 % — ABNORMAL HIGH (ref 4.6–6.5)

## 2015-01-15 ENCOUNTER — Other Ambulatory Visit: Payer: Managed Care, Other (non HMO)

## 2015-01-19 ENCOUNTER — Ambulatory Visit (INDEPENDENT_AMBULATORY_CARE_PROVIDER_SITE_OTHER): Payer: Managed Care, Other (non HMO) | Admitting: Family Medicine

## 2015-01-19 ENCOUNTER — Encounter: Payer: Self-pay | Admitting: Family Medicine

## 2015-01-19 VITALS — BP 124/80 | HR 80 | Temp 97.7°F | Wt 205.2 lb

## 2015-01-19 DIAGNOSIS — E663 Overweight: Secondary | ICD-10-CM | POA: Diagnosis not present

## 2015-01-19 DIAGNOSIS — E109 Type 1 diabetes mellitus without complications: Secondary | ICD-10-CM

## 2015-01-19 NOTE — Progress Notes (Signed)
   Subjective:    Patient ID: David Shadow., male    DOB: Jul 01, 1945, 69 y.o.   MRN: [id number]  HPI David Carr is a 69 year old married male nonsmoker who comes in today for follow-up of diabetes  His current regime is a diet which she's done well, exercise, and metformin 500 mg one tablet daily for breakfast. Is not able to tolerate any more than 500 mg a day. He has GI side effects. He's on insulin 20 units daily. A1c has not gone up from 7.2 7.5%.  Review of Systems    Review of systems otherwise negative Objective:   Physical Exam Well-developed well-nourished male no acute distress vital signs stable he is afebrile       Assessment & Plan:  Diabetes type 1 not at goal......... continue diet exercise increase insulin to 25 units daily follow-up A1c in 3 months

## 2015-01-19 NOTE — Progress Notes (Signed)
Pre visit review using our clinic review tool, if applicable. No additional management support is needed unless otherwise documented below in the visit note. 

## 2015-01-19 NOTE — Patient Instructions (Signed)
Increase your insulin to 25 units daily  Follow-up in 3 months  Nonfasting labs one week prior

## 2015-02-01 ENCOUNTER — Encounter: Payer: Self-pay | Admitting: Cardiology

## 2015-02-01 ENCOUNTER — Ambulatory Visit (INDEPENDENT_AMBULATORY_CARE_PROVIDER_SITE_OTHER): Payer: Managed Care, Other (non HMO) | Admitting: Cardiology

## 2015-02-01 VITALS — BP 120/70 | HR 80 | Ht 68.0 in | Wt 203.4 lb

## 2015-02-01 DIAGNOSIS — I080 Rheumatic disorders of both mitral and aortic valves: Secondary | ICD-10-CM | POA: Diagnosis not present

## 2015-02-01 DIAGNOSIS — I10 Essential (primary) hypertension: Secondary | ICD-10-CM

## 2015-02-01 NOTE — Patient Instructions (Signed)
Your physician wants you to follow-up in: 1 Year. You will receive a reminder letter in the mail two months in advance. If you don't receive a letter, please call our office to schedule the follow-up appointment.  Your physician has requested that you have an echocardiogram in 6 Months. Echocardiography is a painless test that uses sound waves to create images of your heart. It provides your doctor with information about the size and shape of your heart and how well your heart's chambers and valves are working. This procedure takes approximately one hour. There are no restrictions for this procedure.

## 2015-02-01 NOTE — Progress Notes (Signed)
HPI The patient presents for followup of MR.  Since I last saw him he was diagnosed with AML.  He had HiDAC therapy at [facility].  As part of this he has had repeated echos.  I did follow up on this and reviewed the results of an outside echo done last Dec.  He otherwise feels well.  The patient denies any new symptoms such as chest discomfort, neck or arm discomfort. There has been no new shortness of breath, PND or orthopnea. There have been no reported palpitations, presyncope or syncope.   Allergies  Allergen Reactions  . Morphine And Related     Shuts bladder down.  Marland Kitchen Prochlorperazine Other (See Comments)    Heart arrythmia    Current Outpatient Prescriptions  Medication Sig Dispense Refill  . atenolol (TENORMIN) 25 MG tablet Take 0.5 tablets (12.5 mg total) by mouth daily. 45 tablet 3  . glucose blood (FREESTYLE LITE) test strip Test once daily dx 250.00 100 each 3  . insulin lispro protamine-lispro (HUMALOG 75/25 MIX) (75-25) 100 UNIT/ML SUSP injection 15-20 units daily (Patient taking differently: Inject 25 Units into the skin daily with breakfast. 15-20 units daily) 3 vial 3  . INSULIN SYRINGE .5CC/29G (B-D INSULIN SYRINGE) 29G X 1/2" 0.5 ML MISC Use once daily, dx 250.00 100 each 3  . metFORMIN (GLUCOPHAGE) 500 MG tablet Take 1 tablet (500 mg total) by mouth daily with breakfast. (Patient taking differently: Take 500 mg by mouth daily after supper. ) 90 tablet 3  . simvastatin (ZOCOR) 20 MG tablet Take 1 tablet (20 mg total) by mouth at bedtime. 90 tablet 3   No current facility-administered medications for this visit.    Past Medical History  Diagnosis Date  . Allergy   . Diabetes mellitus   . Asthma   . Hyperlipidemia   . Hypertension   . Urinary tract disorder     outlet obstruction following surgery  . Mitral insufficiency   . Thrombocytopenia, unspecified ([place]) 09/04/2013  . 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 Other fatigue 01/19/2014  .  Nasal drainage 02/12/2014    Past Surgical History  Procedure Laterality Date  . Inguinal hernia repair      left  . Elbow surgery    . Rotator cuff repair      ROS:  As stated in the HPI and negative for all other systems.  PHYSICAL EXAM BP 120/70 mmHg  Pulse 80  Ht 5\' 8"  (1.727 m)  Wt 203 lb 6.4 oz (92.262 kg)  BMI 30.93 kg/m2 GENERAL:  Well appearing NECK:  No jugular venous distention, waveform within normal limits, carotid upstroke brisk and symmetric, no bruits, no thyromegaly LYMPHATICS:  No cervical, inguinal adenopathy LUNGS:  Clear to auscultation bilaterally CHEST:  Unremarkable HEART:  PMI not displaced or sustained,S1 and S2 within normal limits, no S3, no S4, no clicks, no rubs, apical systolic murmur brief and slightly radiating to the apex. ABD:  Flat, positive bowel sounds normal in frequency in pitch, no bruits, no rebound, no guarding, no midline pulsatile mass, no hepatomegaly, no splenomegaly, umbilical hernia EXT:  2 plus pulses throughout, mild ankle edema, no cyanosis no clubbing   EKG: Sinus rhythm, rate 80, axis within normal limits, intervals within normal limits, no acute ST-T wave changes. Premature ectopic beats.  07/08/2013   ASSESSMENT AND PLAN   MITRAL REGURGITATION -  I reviewed the echo done at [facility] in December of last year. He had moderate  mitral regurgitation unchanged from previous and his ejection fraction was preserved. He's had no new symptoms.  I will check an echocardiogram in mid July. He is aware of the symptoms that might develop should he have any acute worsening.  HYPERTENSION - The blood pressure is at target. No change in medications is indicated. We will continue with therapeutic lifestyle changes (TLC).  DYSLIPIDEMIA - He will continue with the meds as listed.  Lab Results  Component Value Date   CHOL 139 10/16/2014   TRIG 77.0 10/16/2014   HDL 49.10 10/16/2014   LDLCALC 74 10/16/2014   LDLDIRECT 74.4 06/19/2006     RISK REDUCTION - He is going to ask his oncologist tomorrow if he can take ASA.  His 10 year risk is greater than 7.5%

## 2015-02-02 ENCOUNTER — Telehealth: Payer: Self-pay | Admitting: Hematology and Oncology

## 2015-02-02 ENCOUNTER — Ambulatory Visit (HOSPITAL_BASED_OUTPATIENT_CLINIC_OR_DEPARTMENT_OTHER): Payer: Managed Care, Other (non HMO) | Admitting: Hematology and Oncology

## 2015-02-02 ENCOUNTER — Telehealth: Payer: Self-pay | Admitting: *Deleted

## 2015-02-02 ENCOUNTER — Encounter: Payer: Self-pay | Admitting: Hematology and Oncology

## 2015-02-02 ENCOUNTER — Ambulatory Visit: Payer: Managed Care, Other (non HMO)

## 2015-02-02 ENCOUNTER — Other Ambulatory Visit (HOSPITAL_BASED_OUTPATIENT_CLINIC_OR_DEPARTMENT_OTHER): Payer: Managed Care, Other (non HMO)

## 2015-02-02 VITALS — BP 136/75 | HR 75 | Temp 98.0°F | Resp 18 | Ht 68.0 in | Wt 202.4 lb

## 2015-02-02 DIAGNOSIS — E109 Type 1 diabetes mellitus without complications: Secondary | ICD-10-CM | POA: Diagnosis not present

## 2015-02-02 DIAGNOSIS — C9201 Acute myeloblastic leukemia, in remission: Secondary | ICD-10-CM

## 2015-02-02 DIAGNOSIS — D696 Thrombocytopenia, unspecified: Secondary | ICD-10-CM | POA: Diagnosis not present

## 2015-02-02 DIAGNOSIS — Z95828 Presence of other vascular implants and grafts: Secondary | ICD-10-CM

## 2015-02-02 LAB — COMPREHENSIVE METABOLIC PANEL
ALK PHOS: 77 U/L (ref 40–150)
ALT: 23 U/L (ref 0–55)
AST: 21 U/L (ref 5–34)
Albumin: 3.9 g/dL (ref 3.5–5.0)
Anion Gap: 8 mEq/L (ref 3–11)
BUN: 18.8 mg/dL (ref 7.0–26.0)
CHLORIDE: 106 meq/L (ref 98–109)
CO2: 27 meq/L (ref 22–29)
Calcium: 9.2 mg/dL (ref 8.4–10.4)
Creatinine: 0.9 mg/dL (ref 0.7–1.3)
EGFR: 84 mL/min/{1.73_m2} — AB (ref >90)
GLUCOSE: 135 mg/dL (ref 70–140)
POTASSIUM: 4 meq/L (ref 3.5–5.1)
SODIUM: 141 meq/L (ref 136–145)
Total Bilirubin: 0.82 mg/dL (ref 0.20–1.20)
Total Protein: 6.9 g/dL (ref 6.4–8.3)

## 2015-02-02 LAB — CBC WITH DIFFERENTIAL/PLATELET
BASO%: 0.3 % (ref 0.0–2.0)
BASOS ABS: 0 10*3/uL (ref 0.0–0.1)
EOS ABS: 0 10*3/uL (ref 0.0–0.5)
EOS%: 0.4 % (ref 0.0–7.0)
HCT: 45 % (ref 38.4–49.9)
HGB: 15.1 g/dL (ref 13.0–17.1)
LYMPH%: 19.4 % (ref 14.0–49.0)
MCH: 32.6 pg (ref 27.2–33.4)
MCHC: 33.4 g/dL (ref 32.0–36.0)
MCV: 97.5 fL (ref 79.3–98.0)
MONO#: 0.4 10*3/uL (ref 0.1–0.9)
MONO%: 8.7 % (ref 0.0–14.0)
NEUT#: 3.3 10*3/uL (ref 1.5–6.5)
NEUT%: 71.2 % (ref 39.0–75.0)
Platelets: 138 10*3/uL — ABNORMAL LOW (ref 140–400)
RBC: 4.61 10*6/uL (ref 4.20–5.82)
RDW: 13.9 % (ref 11.0–14.6)
WBC: 4.6 10*3/uL (ref 4.0–10.3)
lymph#: 0.9 10*3/uL (ref 0.9–3.3)

## 2015-02-02 MED ORDER — SODIUM CHLORIDE 0.9 % IJ SOLN
10.0000 mL | INTRAMUSCULAR | Status: AC | PRN
  Filled 2015-02-02: qty 10

## 2015-02-02 MED ORDER — HEPARIN SOD (PORK) LOCK FLUSH 100 UNIT/ML IV SOLN
500.0000 [IU] | Freq: Once | INTRAVENOUS | Status: AC
  Filled 2015-02-02: qty 5

## 2015-02-02 NOTE — Assessment & Plan Note (Signed)
he will continue current medical management. I recommend close follow-up with primary care doctor for medication adjustment. I recommend resume 81 mg aspirin to prevent risk of heart disease/stroke

## 2015-02-02 NOTE — Telephone Encounter (Signed)
per pof to sch pt appt-gave pt copy of avs °

## 2015-02-02 NOTE — Assessment & Plan Note (Signed)
He is not symptomatic. His blood work confirmed the patient remained in remission I will schedule close monitoring of his blood work to every 6 weeks and see him back in 3 months.

## 2015-02-02 NOTE — Telephone Encounter (Signed)
Dr. Alvy Bimler ordered [facility] removed by I.R. at [facility].  It was placed at [place] and s/w Angie, RN.  She asked Dr. Nadara Mustard if ok w/ her for pt to have his PAC removed here by our IR dept?  Angie states Dr. Nadara Mustard says ok for [facility] to be removed by our Interventional Radiology Dept.  Notified Tiffany in IR.

## 2015-02-02 NOTE — Progress Notes (Signed)
[place] OFFICE PROGRESS NOTE  Patient Care Team: Dorena Cookey, MD as PCP - General  SUMMARY OF ONCOLOGIC HISTORY:   AML (acute myeloid leukemia) in remission ([place])   09/11/2013 Bone Marrow Biopsy Bone marrow biopsy showed AML, FLT3 negative, NPM1 positive   09/17/2013 -  Chemotherapy He was given induction chemotherapy at [facility] (7+3)   10/01/2013 -  Chemotherapy He had repeat induction chemotherapy due to persistent disease (5+2)   11/06/2013 Bone Marrow Biopsy Bone marrow biopsy confirmed remission   11/13/2013 - 11/17/2013 Chemotherapy He received cycle 1 of consolidation chemotherapy with HiDAC   12/18/2013 - 12/22/2013 Chemotherapy He received cycle 2 of consolidation chemotherapy with HiDAC   01/23/2014 - 01/28/2014 Chemotherapy he received cycle 3 of consolidation chemotherapy with HiDAC   03/05/2014 - 03/10/2014 Hospital Admission he received cycle 4 of consolidation chemotherapy with HiDAC    INTERVAL HISTORY: Please see below for problem oriented charting. He returns for further follow-up. He is working still. He denies any signs or symptoms of infection. The patient denies any recent signs or symptoms of bleeding such as spontaneous epistaxis, hematuria or hematochezia.   REVIEW OF SYSTEMS:   Constitutional: Denies fevers, chills or abnormal weight loss Eyes: Denies blurriness of vision Ears, nose, mouth, throat, and face: Denies mucositis or sore throat Respiratory: Denies cough, dyspnea or wheezes Cardiovascular: Denies palpitation, chest discomfort or lower extremity swelling Gastrointestinal:  Denies nausea, heartburn or change in bowel habits Skin: Denies abnormal skin rashes Lymphatics: Denies new lymphadenopathy or easy bruising Neurological:Denies numbness, tingling or new weaknesses Behavioral/Psych: Mood is stable, no new changes  All other systems were reviewed with the patient and are negative.  I have reviewed the past medical history,  past surgical history, social history and family history with the patient and they are unchanged from previous note.  ALLERGIES:  is allergic to morphine and related and prochlorperazine.  MEDICATIONS:  Current Outpatient Prescriptions  Medication Sig Dispense Refill  . aspirin 81 MG tablet Take 81 mg by mouth daily.    Marland Kitchen atenolol (TENORMIN) 25 MG tablet Take 0.5 tablets (12.5 mg total) by mouth daily. 45 tablet 3  . glucose blood (FREESTYLE LITE) test strip Test once daily dx 250.00 100 each 3  . insulin lispro protamine-lispro (HUMALOG 75/25 MIX) (75-25) 100 UNIT/ML SUSP injection 15-20 units daily (Patient taking differently: Inject 25 Units into the skin daily with breakfast. 15-20 units daily) 3 vial 3  . INSULIN SYRINGE .5CC/29G (B-D INSULIN SYRINGE) 29G X 1/2" 0.5 ML MISC Use once daily, dx 250.00 100 each 3  . metFORMIN (GLUCOPHAGE) 500 MG tablet Take 1 tablet (500 mg total) by mouth daily with breakfast. (Patient taking differently: Take 500 mg by mouth daily after supper. ) 90 tablet 3  . simvastatin (ZOCOR) 20 MG tablet Take 1 tablet (20 mg total) by mouth at bedtime. 90 tablet 3   No current facility-administered medications for this visit.   Facility-Administered Medications Ordered in Other Visits  Medication Dose Route Frequency Provider Last Rate Last Dose  . heparin lock flush 100 unit/mL  500 Units Intravenous Once Brunetta Genera, MD      . sodium chloride 0.9 % injection 10 mL  10 mL Intravenous PRN Brunetta Genera, MD        PHYSICAL EXAMINATION: ECOG PERFORMANCE STATUS: 0 - Asymptomatic  Filed Vitals:   02/02/15 1416  BP: 136/75  Pulse: 75  Temp: 98 F (36.7 C)  Resp: 18  Filed Weights   02/02/15 1416  Weight: 202 lb 6.4 oz (91.808 kg)    GENERAL:alert, no distress and comfortable SKIN: skin color, texture, turgor are normal, no rashes or significant lesions EYES: normal, Conjunctiva are pink and non-injected, sclera clear OROPHARYNX:no  exudate, no erythema and lips, buccal mucosa, and tongue normal  NECK: supple, thyroid normal size, non-tender, without nodularity LYMPH:  no palpable lymphadenopathy in the cervical, axillary or inguinal LUNGS: clear to auscultation and percussion with normal breathing effort HEART: regular rate & rhythm and no murmurs and no lower extremity edema ABDOMEN:abdomen soft, non-tender and normal bowel sounds Musculoskeletal:no cyanosis of digits and no clubbing  NEURO: alert & oriented x 3 with fluent speech, no focal motor/sensory deficits  LABORATORY DATA:  I have reviewed the data as listed    Component Value Date/Time   NA 141 02/02/2015 1333   NA 141 01/14/2015 1354   K 4.0 02/02/2015 1333   K 4.5 01/14/2015 1354   CL 104 01/14/2015 1354   CO2 27 02/02/2015 1333   CO2 30 01/14/2015 1354   GLUCOSE 135 02/02/2015 1333   GLUCOSE 128* 01/14/2015 1354   BUN 18.8 02/02/2015 1333   BUN 15 01/14/2015 1354   CREATININE 0.9 02/02/2015 1333   CREATININE 0.88 01/14/2015 1354   CALCIUM 9.2 02/02/2015 1333   CALCIUM 9.4 01/14/2015 1354   PROT 6.9 02/02/2015 1333   PROT 6.4 10/16/2014 0933   ALBUMIN 3.9 02/02/2015 1333   ALBUMIN 3.9 10/16/2014 0933   AST 21 02/02/2015 1333   AST 17 10/16/2014 0933   ALT 23 02/02/2015 1333   ALT 23 10/16/2014 0933   ALKPHOS 77 02/02/2015 1333   ALKPHOS 77 10/16/2014 0933   BILITOT 0.82 02/02/2015 1333   BILITOT 0.7 10/16/2014 0933   GFRNONAA 71.09 01/24/2010 0753   GFRAA 78 01/20/2008 0755    No results found for: SPEP, UPEP  Lab Results  Component Value Date   WBC 4.6 02/02/2015   NEUTROABS 3.3 02/02/2015   HGB 15.1 02/02/2015   HCT 45.0 02/02/2015   MCV 97.5 02/02/2015   PLT 138* 02/02/2015      Chemistry      Component Value Date/Time   NA 141 02/02/2015 1333   NA 141 01/14/2015 1354   K 4.0 02/02/2015 1333   K 4.5 01/14/2015 1354   CL 104 01/14/2015 1354   CO2 27 02/02/2015 1333   CO2 30 01/14/2015 1354   BUN 18.8 02/02/2015  1333   BUN 15 01/14/2015 1354   CREATININE 0.9 02/02/2015 1333   CREATININE 0.88 01/14/2015 1354      Component Value Date/Time   CALCIUM 9.2 02/02/2015 1333   CALCIUM 9.4 01/14/2015 1354   ALKPHOS 77 02/02/2015 1333   ALKPHOS 77 10/16/2014 0933   AST 21 02/02/2015 1333   AST 17 10/16/2014 0933   ALT 23 02/02/2015 1333   ALT 23 10/16/2014 0933   BILITOT 0.82 02/02/2015 1333   BILITOT 0.7 10/16/2014 0933      ASSESSMENT & PLAN:  AML (acute myeloid leukemia) in remission He is not symptomatic. His blood work confirmed the patient remained in remission I will schedule close monitoring of his blood work to every 6 weeks and see him back in 3 months.    Thrombocytopenia ([place])  He has chronic thrombocytopenia which I suspect could be due to sequestration/fatty liver disease. He is not symptomatic. Observe only.    Diabetes mellitus type 1, controlled, without complications he will continue current  medical management. I recommend close follow-up with primary care doctor for medication adjustment. I recommend resume 81 mg aspirin to prevent risk of heart disease/stroke  I will arrange to get his port removed  Orders Placed This Encounter  Procedures  . IR Removal Tun Access W/ Port W/O FL    Standing Status: Future     Number of Occurrences:      Standing Expiration Date: 04/04/2016    Order Specific Question:  Reason for exam:    Answer:  no ned port    Order Specific Question:  Preferred Imaging Location?    Answer:  [facility]  . CBC with Differential/Platelet    Standing Status: Standing     Number of Occurrences: 22     Standing Expiration Date: 02/02/2016   All questions were answered. The patient knows to call the clinic with any problems, questions or concerns. No barriers to learning was detected. I spent 15 minutes counseling the patient face to face. The total time spent in the appointment was 20 minutes and more than 50% was on counseling and  review of test results     [facility], [place], MD 02/02/2015 2:37 PM

## 2015-02-02 NOTE — Assessment & Plan Note (Signed)
He has chronic thrombocytopenia which I suspect could be due to sequestration/fatty liver disease. He is not symptomatic. Observe only. 

## 2015-02-05 ENCOUNTER — Other Ambulatory Visit: Payer: Self-pay | Admitting: Radiology

## 2015-02-09 ENCOUNTER — Ambulatory Visit (HOSPITAL_COMMUNITY)
Admission: RE | Admit: 2015-02-09 | Discharge: 2015-02-09 | Disposition: A | Discharge status: Home / Self Care | Payer: Managed Care, Other (non HMO) | Source: Ambulatory Visit | Attending: Hematology and Oncology | Admitting: Hematology and Oncology

## 2015-02-09 DIAGNOSIS — Z452 Encounter for adjustment and management of vascular access device: Secondary | ICD-10-CM | POA: Diagnosis not present

## 2015-02-09 DIAGNOSIS — C9201 Acute myeloblastic leukemia, in remission: Secondary | ICD-10-CM | POA: Diagnosis not present

## 2015-02-09 LAB — CBC WITH DIFFERENTIAL/PLATELET
BASOS ABS: 0 10*3/uL (ref 0.0–0.1)
Basophils Relative: 0 %
EOS PCT: 1 %
Eosinophils Absolute: 0 10*3/uL (ref 0.0–0.7)
HCT: 42 % (ref 39.0–52.0)
Hemoglobin: 14.7 g/dL (ref 13.0–17.0)
LYMPHS PCT: 18 %
Lymphs Abs: 0.7 10*3/uL (ref 0.7–4.0)
MCH: 33.7 pg (ref 26.0–34.0)
MCHC: 35 g/dL (ref 30.0–36.0)
MCV: 96.3 fL (ref 78.0–100.0)
MONO ABS: 0.3 10*3/uL (ref 0.1–1.0)
MONOS PCT: 7 %
Neutro Abs: 3.1 10*3/uL (ref 1.7–7.7)
Neutrophils Relative %: 74 %
PLATELETS: 133 10*3/uL — AB (ref 150–400)
RBC: 4.36 MIL/uL (ref 4.22–5.81)
RDW: 13.1 % (ref 11.5–15.5)
WBC: 4.2 10*3/uL (ref 4.0–10.5)

## 2015-02-09 LAB — GLUCOSE, CAPILLARY: GLUCOSE-CAPILLARY: 194 mg/dL — AB (ref 65–99)

## 2015-02-09 LAB — PROTIME-INR
INR: 1.03 (ref 0.00–1.49)
Prothrombin Time: 13.7 seconds (ref 11.6–15.2)

## 2015-02-09 MED ORDER — CEFAZOLIN SODIUM-DEXTROSE 2-3 GM-% IV SOLR
INTRAVENOUS | Status: AC
  Administered 2015-02-09: 2 g via INTRAVENOUS
  Filled 2015-02-09: qty 50

## 2015-02-09 MED ORDER — CEFAZOLIN SODIUM-DEXTROSE 2-3 GM-% IV SOLR: 2.0000 g | Freq: Once | INTRAVENOUS | Status: AC

## 2015-02-09 MED ORDER — LIDOCAINE HCL 1 % IJ SOLN
INTRAMUSCULAR | Status: AC
  Filled 2015-02-09: qty 20

## 2015-02-09 MED ORDER — SODIUM CHLORIDE 0.9 % IV SOLN: INTRAVENOUS | Status: DC

## 2015-02-09 MED ORDER — CEFAZOLIN SODIUM-DEXTROSE 2-3 GM-% IV SOLR
2.0000 g | INTRAVENOUS | Status: DC
  Administered 2015-02-09: 2 g via INTRAVENOUS

## 2015-02-09 NOTE — Procedures (Signed)
Interventional Radiology Procedure Note  Procedure:  Port removal  Complications:  None  Estimated Blood Loss: < 25 mL  Entire right chest port removed.    Venetia Night. Kathlene Cote, M.D Pager:  [phone]

## 2015-03-05 ENCOUNTER — Telehealth: Payer: Self-pay | Admitting: *Deleted

## 2015-03-05 ENCOUNTER — Other Ambulatory Visit (HOSPITAL_BASED_OUTPATIENT_CLINIC_OR_DEPARTMENT_OTHER): Payer: Managed Care, Other (non HMO)

## 2015-03-05 DIAGNOSIS — C9201 Acute myeloblastic leukemia, in remission: Secondary | ICD-10-CM | POA: Diagnosis not present

## 2015-03-05 LAB — COMPREHENSIVE METABOLIC PANEL
ALK PHOS: 82 U/L (ref 40–150)
ALT: 22 U/L (ref 0–55)
ANION GAP: 9 meq/L (ref 3–11)
AST: 18 U/L (ref 5–34)
Albumin: 3.8 g/dL (ref 3.5–5.0)
BUN: 13 mg/dL (ref 7.0–26.0)
CALCIUM: 9.2 mg/dL (ref 8.4–10.4)
CHLORIDE: 102 meq/L (ref 98–109)
CO2: 28 mEq/L (ref 22–29)
Creatinine: 1 mg/dL (ref 0.7–1.3)
EGFR: 72 mL/min/{1.73_m2} — AB (ref >90)
Glucose: 239 mg/dl — ABNORMAL HIGH (ref 70–140)
POTASSIUM: 4.2 meq/L (ref 3.5–5.1)
Sodium: 139 mEq/L (ref 136–145)
Total Bilirubin: 0.85 mg/dL (ref 0.20–1.20)
Total Protein: 6.9 g/dL (ref 6.4–8.3)

## 2015-03-05 LAB — CBC WITH DIFFERENTIAL/PLATELET
BASO%: 0.3 % (ref 0.0–2.0)
BASOS ABS: 0 10*3/uL (ref 0.0–0.1)
EOS ABS: 0 10*3/uL (ref 0.0–0.5)
EOS%: 0.7 % (ref 0.0–7.0)
HEMATOCRIT: 45.5 % (ref 38.4–49.9)
HGB: 15.4 g/dL (ref 13.0–17.1)
LYMPH#: 0.8 10*3/uL — AB (ref 0.9–3.3)
LYMPH%: 18.2 % (ref 14.0–49.0)
MCH: 32.9 pg (ref 27.2–33.4)
MCHC: 33.9 g/dL (ref 32.0–36.0)
MCV: 96.9 fL (ref 79.3–98.0)
MONO#: 0.3 10*3/uL (ref 0.1–0.9)
MONO%: 8.1 % (ref 0.0–14.0)
NEUT#: 3.1 10*3/uL (ref 1.5–6.5)
NEUT%: 72.7 % (ref 39.0–75.0)
PLATELETS: 131 10*3/uL — AB (ref 140–400)
RBC: 4.7 10*6/uL (ref 4.20–5.82)
RDW: 13.6 % (ref 11.0–14.6)
WBC: 4.2 10*3/uL (ref 4.0–10.3)

## 2015-03-05 NOTE — Telephone Encounter (Signed)
LVM for pt informing of labs stable per Dr. Alvy Bimler.  Call us back if any questions or concerns. Faxed results to [facility].

## 2015-03-05 NOTE — Telephone Encounter (Signed)
-----   Message from Heath Lark, MD sent at 03/05/2015  2:21 PM EST ----- Regarding: call him, labs stable   ----- Message -----    From: [facility] Interface    Sent: 03/05/2015   1:19 PM      To: Heath Lark, MD

## 2015-04-15 ENCOUNTER — Other Ambulatory Visit (INDEPENDENT_AMBULATORY_CARE_PROVIDER_SITE_OTHER): Payer: Managed Care, Other (non HMO)

## 2015-04-15 DIAGNOSIS — E109 Type 1 diabetes mellitus without complications: Secondary | ICD-10-CM | POA: Diagnosis not present

## 2015-04-15 LAB — BASIC METABOLIC PANEL
BUN: 15 mg/dL (ref 6–23)
CALCIUM: 9.5 mg/dL (ref 8.4–10.5)
CO2: 31 mEq/L (ref 19–32)
CREATININE: 0.88 mg/dL (ref 0.40–1.50)
Chloride: 103 mEq/L (ref 96–112)
GFR: 90.57 mL/min (ref >60.00)
GLUCOSE: 162 mg/dL — AB (ref 70–99)
Potassium: 5 mEq/L (ref 3.5–5.1)
Sodium: 140 mEq/L (ref 135–145)

## 2015-04-15 LAB — HEMOGLOBIN A1C: Hgb A1c MFr Bld: 8 % — ABNORMAL HIGH (ref 4.6–6.5)

## 2015-04-16 ENCOUNTER — Telehealth: Payer: Self-pay | Admitting: *Deleted

## 2015-04-16 ENCOUNTER — Other Ambulatory Visit (HOSPITAL_BASED_OUTPATIENT_CLINIC_OR_DEPARTMENT_OTHER): Payer: Managed Care, Other (non HMO)

## 2015-04-16 DIAGNOSIS — C9201 Acute myeloblastic leukemia, in remission: Secondary | ICD-10-CM | POA: Diagnosis not present

## 2015-04-16 LAB — CBC WITH DIFFERENTIAL/PLATELET
BASO%: 0 % (ref 0.0–2.0)
Basophils Absolute: 0 10*3/uL (ref 0.0–0.1)
EOS ABS: 0 10*3/uL (ref 0.0–0.5)
EOS%: 0.5 % (ref 0.0–7.0)
HCT: 42.8 % (ref 38.4–49.9)
HEMOGLOBIN: 15.2 g/dL (ref 13.0–17.1)
LYMPH%: 19.9 % (ref 14.0–49.0)
MCH: 33.6 pg — ABNORMAL HIGH (ref 27.2–33.4)
MCHC: 35.5 g/dL (ref 32.0–36.0)
MCV: 94.5 fL (ref 79.3–98.0)
MONO#: 0.3 10*3/uL (ref 0.1–0.9)
MONO%: 7.3 % (ref 0.0–14.0)
NEUT%: 72.3 % (ref 39.0–75.0)
NEUTROS ABS: 2.7 10*3/uL (ref 1.5–6.5)
PLATELETS: 121 10*3/uL — AB (ref 140–400)
RBC: 4.53 10*6/uL (ref 4.20–5.82)
RDW: 13.6 % (ref 11.0–14.6)
WBC: 3.7 10*3/uL — AB (ref 4.0–10.3)
lymph#: 0.7 10*3/uL — ABNORMAL LOW (ref 0.9–3.3)

## 2015-04-16 NOTE — Telephone Encounter (Signed)
LVM for pt informing of CBC ok and please call nurse back if any specific questions.

## 2015-04-16 NOTE — Telephone Encounter (Signed)
-----   Message from Heath Lark, MD sent at 04/16/2015 11:33 AM EST ----- Regarding: CBC oK   ----- Message -----    From: [facility]    Sent: 04/16/2015  11:32 AM      To: Heath Lark, MD

## 2015-04-19 ENCOUNTER — Ambulatory Visit: Payer: Managed Care, Other (non HMO) | Admitting: Family Medicine

## 2015-04-27 ENCOUNTER — Encounter: Payer: Self-pay | Admitting: Family Medicine

## 2015-04-27 ENCOUNTER — Ambulatory Visit (INDEPENDENT_AMBULATORY_CARE_PROVIDER_SITE_OTHER): Payer: Managed Care, Other (non HMO) | Admitting: Family Medicine

## 2015-04-27 VITALS — BP 140/70 | Temp 98.4°F | Ht 63.0 in | Wt 208.0 lb

## 2015-04-27 DIAGNOSIS — E109 Type 1 diabetes mellitus without complications: Secondary | ICD-10-CM | POA: Diagnosis not present

## 2015-04-27 DIAGNOSIS — E139 Other specified diabetes mellitus without complications: Secondary | ICD-10-CM | POA: Diagnosis not present

## 2015-04-27 DIAGNOSIS — E663 Overweight: Secondary | ICD-10-CM

## 2015-04-27 MED ORDER — "INSULIN SYRINGE 29G X 1/2"" 0.5 ML MISC": Status: DC

## 2015-04-27 MED ORDER — INSULIN LISPRO PROT & LISPRO (75-25 MIX) 100 UNIT/ML ~~LOC~~ SUSP: SUBCUTANEOUS | Status: DC

## 2015-04-27 NOTE — Progress Notes (Signed)
Pre visit review using our clinic review tool, if applicable. No additional management support is needed unless otherwise documented below in the visit note. 

## 2015-04-27 NOTE — Patient Instructions (Signed)
Increase your insulin to 30 units daily  Check a blood sugar before your evening meal  Call in 2 weeks Rachel's extension is [phone],,,,,,,,,,,, and leave [place] a message about your blood sugars. We will call you back to discuss changing her dose if needed  In other words we'll increase your insulin by 5 units every 2 weeks and to your blood sugar drops to normal  Work heart on the diet and exercise and weight loss  Return the second week in June for follow-up A1c........... nonfasting labs one week prior.......... Tommi Rumps or Almyra Free are 2 new adult nurse practitioner's or Dr. [place]

## 2015-04-27 NOTE — Progress Notes (Signed)
   Subjective:    Patient ID: David Carr., male    DOB: October 12, 1945, 71 y.o.   MRN: [id number]  HPI David Carr is a 70 year old married male nonsmoker who comes in today for follow-up of diabetes type 1. He is on insulin 25 units daily. A1c is up to 8.  His weight is also gone up 6 pounds since December.   Review of Systems Review of systems otherwise negative he states his morning blood sugars are 90-110    Objective:   Physical Exam  Well-developed well-nourished overweight male no acute distress vital signs stable he is afebrile      Assessment & Plan:  Diabetes type 1 not at goal......... increase insulin by 5 units every 2 weeks until blood sugar drops to normal........ again encouraged diet exercise and weight loss........ follow-up in 3 months

## 2015-05-28 ENCOUNTER — Ambulatory Visit (HOSPITAL_BASED_OUTPATIENT_CLINIC_OR_DEPARTMENT_OTHER): Payer: Managed Care, Other (non HMO) | Admitting: Hematology and Oncology

## 2015-05-28 ENCOUNTER — Encounter: Payer: Self-pay | Admitting: Hematology and Oncology

## 2015-05-28 ENCOUNTER — Telehealth: Payer: Self-pay | Admitting: Hematology and Oncology

## 2015-05-28 ENCOUNTER — Other Ambulatory Visit (HOSPITAL_BASED_OUTPATIENT_CLINIC_OR_DEPARTMENT_OTHER): Payer: Managed Care, Other (non HMO)

## 2015-05-28 VITALS — BP 139/65 | HR 82 | Temp 99.2°F | Resp 18 | Ht 63.0 in | Wt 206.5 lb

## 2015-05-28 DIAGNOSIS — C9201 Acute myeloblastic leukemia, in remission: Secondary | ICD-10-CM

## 2015-05-28 DIAGNOSIS — D61818 Other pancytopenia: Secondary | ICD-10-CM | POA: Diagnosis not present

## 2015-05-28 HISTORY — DX: Other pancytopenia: D61.818

## 2015-05-28 LAB — CBC WITH DIFFERENTIAL/PLATELET
BASO%: 0.5 % (ref 0.0–2.0)
Basophils Absolute: 0 10*3/uL (ref 0.0–0.1)
EOS%: 0.4 % (ref 0.0–7.0)
Eosinophils Absolute: 0 10*3/uL (ref 0.0–0.5)
HEMATOCRIT: 44.7 % (ref 38.4–49.9)
HGB: 15 g/dL (ref 13.0–17.1)
LYMPH#: 0.7 10*3/uL — AB (ref 0.9–3.3)
LYMPH%: 19.1 % (ref 14.0–49.0)
MCH: 32.6 pg (ref 27.2–33.4)
MCHC: 33.5 g/dL (ref 32.0–36.0)
MCV: 97.1 fL (ref 79.3–98.0)
MONO#: 0.3 10*3/uL (ref 0.1–0.9)
MONO%: 7.3 % (ref 0.0–14.0)
NEUT%: 72.7 % (ref 39.0–75.0)
NEUTROS ABS: 2.8 10*3/uL (ref 1.5–6.5)
PLATELETS: 122 10*3/uL — AB (ref 140–400)
RBC: 4.6 10*6/uL (ref 4.20–5.82)
RDW: 14 % (ref 11.0–14.6)
WBC: 3.9 10*3/uL — AB (ref 4.0–10.3)

## 2015-05-28 NOTE — Assessment & Plan Note (Signed)
He is not symptomatic. His blood work confirmed the patient remained in remission I plan to see him back in 3 months.

## 2015-05-28 NOTE — Assessment & Plan Note (Addendum)
He has chronic thrombocytopenia and intermittent leukopenia which I suspect could be due to sequestration/fatty liver disease. He is not symptomatic. Observe only. Serum vitamin B12 recently was negative

## 2015-05-28 NOTE — Progress Notes (Signed)
[place] OFFICE PROGRESS NOTE  Patient Care Team: Dorena Cookey, MD as PCP - General  SUMMARY OF ONCOLOGIC HISTORY:   AML (acute myeloid leukemia) in remission ([place])   09/11/2013 Bone Marrow Biopsy Bone marrow biopsy showed AML, FLT3 negative, NPM1 positive   09/17/2013 -  Chemotherapy He was given induction chemotherapy at [facility] (7+3)   10/01/2013 -  Chemotherapy He had repeat induction chemotherapy due to persistent disease (5+2)   11/06/2013 Bone Marrow Biopsy Bone marrow biopsy confirmed remission   11/13/2013 - 11/17/2013 Chemotherapy He received cycle 1 of consolidation chemotherapy with HiDAC   12/18/2013 - 12/22/2013 Chemotherapy He received cycle 2 of consolidation chemotherapy with HiDAC   01/23/2014 - 01/28/2014 Chemotherapy he received cycle 3 of consolidation chemotherapy with HiDAC   03/05/2014 - 03/10/2014 Hospital Admission he received cycle 4 of consolidation chemotherapy with HiDAC    INTERVAL HISTORY: Please see below for problem oriented charting. He feels well. Denies recent infection. The patient denies any recent signs or symptoms of bleeding such as spontaneous epistaxis, hematuria or hematochezia.  REVIEW OF SYSTEMS:   Constitutional: Denies fevers, chills or abnormal weight loss Eyes: Denies blurriness of vision Ears, nose, mouth, throat, and face: Denies mucositis or sore throat Respiratory: Denies cough, dyspnea or wheezes Cardiovascular: Denies palpitation, chest discomfort or lower extremity swelling Gastrointestinal:  Denies nausea, heartburn or change in bowel habits Skin: Denies abnormal skin rashes Lymphatics: Denies new lymphadenopathy or easy bruising Neurological:Denies numbness, tingling or new weaknesses Behavioral/Psych: Mood is stable, no new changes  All other systems were reviewed with the patient and are negative.  I have reviewed the past medical history, past surgical history, social history and family history with the  patient and they are unchanged from previous note.  ALLERGIES:  is allergic to morphine and related and prochlorperazine.  MEDICATIONS:  Current Outpatient Prescriptions  Medication Sig Dispense Refill  . aspirin 81 MG tablet Take 81 mg by mouth daily.    Marland Kitchen atenolol (TENORMIN) 25 MG tablet Take 0.5 tablets (12.5 mg total) by mouth daily. 45 tablet 3  . glucose blood (FREESTYLE LITE) test strip Test once daily dx 250.00 100 each 3  . insulin lispro protamine-lispro (HUMALOG 75/25 MIX) (75-25) 100 UNIT/ML SUSP injection 30 units daily 3 vial 3  . INSULIN SYRINGE .5CC/29G (B-D INSULIN SYRINGE) 29G X 1/2" 0.5 ML MISC Use once daily, dx 250.00 100 each 3  . metFORMIN (GLUCOPHAGE) 500 MG tablet Take 1 tablet (500 mg total) by mouth daily with breakfast. (Patient taking differently: Take 500 mg by mouth daily after supper. ) 90 tablet 3  . simvastatin (ZOCOR) 20 MG tablet Take 1 tablet (20 mg total) by mouth at bedtime. 90 tablet 3   No current facility-administered medications for this visit.   Facility-Administered Medications Ordered in Other Visits  Medication Dose Route Frequency Provider Last Rate Last Dose  . heparin lock flush 100 unit/mL  500 Units Intravenous Once Brunetta Genera, MD      . sodium chloride 0.9 % injection 10 mL  10 mL Intravenous PRN Suzan Slick Juleen China, MD        PHYSICAL EXAMINATION: ECOG PERFORMANCE STATUS: 0 - Asymptomatic  Filed Vitals:   05/28/15 1419  BP: 139/65  Pulse: 82  Temp: 99.2 F (37.3 C)  Resp: 18   Filed Weights   05/28/15 1419  Weight: 206 lb 8 oz (93.668 kg)    GENERAL:alert, no distress and comfortable. He is morbidly obese  SKIN: skin color, texture, turgor are normal, no rashes or significant lesions EYES: normal, Conjunctiva are pink and non-injected, sclera clear OROPHARYNX:no exudate, no erythema and lips, buccal mucosa, and tongue normal  NECK: supple, thyroid normal size, non-tender, without nodularity LYMPH:  no palpable  lymphadenopathy in the cervical, axillary or inguinal LUNGS: clear to auscultation and percussion with normal breathing effort HEART: regular rate & rhythm with soft left systolic murmurs and no lower extremity edema ABDOMEN:abdomen soft, non-tender and normal bowel sounds Musculoskeletal:no cyanosis of digits and no clubbing  NEURO: alert & oriented x 3 with fluent speech, no focal motor/sensory deficits  LABORATORY DATA:  I have reviewed the data as listed    Component Value Date/Time   NA 140 04/15/2015 1318   NA 139 03/05/2015 1310   K 5.0 04/15/2015 1318   K 4.2 03/05/2015 1310   CL 103 04/15/2015 1318   CO2 31 04/15/2015 1318   CO2 28 03/05/2015 1310   GLUCOSE 162* 04/15/2015 1318   GLUCOSE 239* 03/05/2015 1310   BUN 15 04/15/2015 1318   BUN 13.0 03/05/2015 1310   CREATININE 0.88 04/15/2015 1318   CREATININE 1.0 03/05/2015 1310   CALCIUM 9.5 04/15/2015 1318   CALCIUM 9.2 03/05/2015 1310   PROT 6.9 03/05/2015 1310   PROT 6.4 10/16/2014 0933   ALBUMIN 3.8 03/05/2015 1310   ALBUMIN 3.9 10/16/2014 0933   AST 18 03/05/2015 1310   AST 17 10/16/2014 0933   ALT 22 03/05/2015 1310   ALT 23 10/16/2014 0933   ALKPHOS 82 03/05/2015 1310   ALKPHOS 77 10/16/2014 0933   BILITOT 0.85 03/05/2015 1310   BILITOT 0.7 10/16/2014 0933   GFRNONAA 71.09 01/24/2010 0753   GFRAA 78 01/20/2008 0755    No results found for: SPEP, UPEP  Lab Results  Component Value Date   WBC 3.9* 05/28/2015   NEUTROABS 2.8 05/28/2015   HGB 15.0 05/28/2015   HCT 44.7 05/28/2015   MCV 97.1 05/28/2015   PLT 122* 05/28/2015      Chemistry      Component Value Date/Time   NA 140 04/15/2015 1318   NA 139 03/05/2015 1310   K 5.0 04/15/2015 1318   K 4.2 03/05/2015 1310   CL 103 04/15/2015 1318   CO2 31 04/15/2015 1318   CO2 28 03/05/2015 1310   BUN 15 04/15/2015 1318   BUN 13.0 03/05/2015 1310   CREATININE 0.88 04/15/2015 1318   CREATININE 1.0 03/05/2015 1310      Component Value Date/Time    CALCIUM 9.5 04/15/2015 1318   CALCIUM 9.2 03/05/2015 1310   ALKPHOS 82 03/05/2015 1310   ALKPHOS 77 10/16/2014 0933   AST 18 03/05/2015 1310   AST 17 10/16/2014 0933   ALT 22 03/05/2015 1310   ALT 23 10/16/2014 0933   BILITOT 0.85 03/05/2015 1310   BILITOT 0.7 10/16/2014 0933     ASSESSMENT & PLAN:  AML (acute myeloid leukemia) in remission He is not symptomatic. His blood work confirmed the patient remained in remission I plan to see him back in 3 months.      Pancytopenia, acquired [facility]) He has chronic thrombocytopenia and intermittent leukopenia which I suspect could be due to sequestration/fatty liver disease. He is not symptomatic. Observe only. Serum vitamin B12 recently was negative     No orders of the defined types were placed in this encounter.   All questions were answered. The patient knows to call the clinic with any problems, questions or concerns. No  barriers to learning was detected. I spent 15 minutes counseling the patient face to face. The total time spent in the appointment was 20 minutes and more than 50% was on counseling and review of test results     [facility], [place], MD 05/28/2015 2:41 PM

## 2015-05-28 NOTE — Telephone Encounter (Signed)
Gave and printed appt sched and avs for pt for July  °

## 2015-06-17 ENCOUNTER — Other Ambulatory Visit: Payer: Self-pay | Admitting: Family Medicine

## 2015-06-17 DIAGNOSIS — E119 Type 2 diabetes mellitus without complications: Secondary | ICD-10-CM

## 2015-07-09 ENCOUNTER — Telehealth: Payer: Self-pay

## 2015-07-09 DIAGNOSIS — E139 Other specified diabetes mellitus without complications: Secondary | ICD-10-CM

## 2015-07-09 MED ORDER — INSULIN LISPRO PROT & LISPRO (75-25 MIX) 100 UNIT/ML ~~LOC~~ SUSP: 35.0000 [IU] | Freq: Every day | SUBCUTANEOUS | Status: DC

## 2015-07-09 NOTE — Telephone Encounter (Signed)
Rec'd a call on VM from this patient identified by DOB; phone [phone] requested this number  Not be placed in "the system". Request call back for refill of rx, but did not state which px.

## 2015-07-09 NOTE — Telephone Encounter (Signed)
Spoke to pt, said he needs new Rx sent to pharmacy for Humalog due to change in units. He is doing 35 units at bedtime. Told pt okay will send new Rx to pharmacy. Pt verbalized understanding. Rx sent.

## 2015-07-16 ENCOUNTER — Encounter: Payer: Self-pay | Admitting: *Deleted

## 2015-07-23 ENCOUNTER — Other Ambulatory Visit: Payer: Managed Care, Other (non HMO)

## 2015-09-03 ENCOUNTER — Encounter: Payer: Self-pay | Admitting: Hematology and Oncology

## 2015-09-03 ENCOUNTER — Telehealth: Payer: Self-pay | Admitting: Hematology and Oncology

## 2015-09-03 ENCOUNTER — Ambulatory Visit (HOSPITAL_BASED_OUTPATIENT_CLINIC_OR_DEPARTMENT_OTHER): Payer: Managed Care, Other (non HMO) | Admitting: Hematology and Oncology

## 2015-09-03 ENCOUNTER — Other Ambulatory Visit (HOSPITAL_BASED_OUTPATIENT_CLINIC_OR_DEPARTMENT_OTHER): Payer: Managed Care, Other (non HMO)

## 2015-09-03 VITALS — BP 130/67 | HR 80 | Temp 98.2°F | Resp 18 | Ht 63.0 in | Wt 208.1 lb

## 2015-09-03 DIAGNOSIS — IMO0001 Reserved for inherently not codable concepts without codable children: Secondary | ICD-10-CM

## 2015-09-03 DIAGNOSIS — D61818 Other pancytopenia: Secondary | ICD-10-CM

## 2015-09-03 DIAGNOSIS — C9201 Acute myeloblastic leukemia, in remission: Secondary | ICD-10-CM | POA: Diagnosis not present

## 2015-09-03 LAB — CBC WITH DIFFERENTIAL/PLATELET
BASO%: 0.3 % (ref 0.0–2.0)
BASOS ABS: 0 10*3/uL (ref 0.0–0.1)
EOS%: 0.8 % (ref 0.0–7.0)
Eosinophils Absolute: 0 10*3/uL (ref 0.0–0.5)
HEMATOCRIT: 41.9 % (ref 38.4–49.9)
HEMOGLOBIN: 15 g/dL (ref 13.0–17.1)
LYMPH#: 0.7 10*3/uL — AB (ref 0.9–3.3)
LYMPH%: 19 % (ref 14.0–49.0)
MCH: 33.7 pg — ABNORMAL HIGH (ref 27.2–33.4)
MCHC: 35.8 g/dL (ref 32.0–36.0)
MCV: 94.2 fL (ref 79.3–98.0)
MONO#: 0.3 10*3/uL (ref 0.1–0.9)
MONO%: 6.6 % (ref 0.0–14.0)
NEUT%: 73.3 % (ref 39.0–75.0)
NEUTROS ABS: 2.8 10*3/uL (ref 1.5–6.5)
NRBC: 0 % (ref 0–0)
PLATELETS: 121 10*3/uL — AB (ref 140–400)
RBC: 4.45 10*6/uL (ref 4.20–5.82)
RDW: 13.7 % (ref 11.0–14.6)
WBC: 3.8 10*3/uL — AB (ref 4.0–10.3)

## 2015-09-03 NOTE — Telephone Encounter (Signed)
Gave pt cal & avs °

## 2015-09-03 NOTE — Assessment & Plan Note (Signed)
The patient is morbidly obese with risk factors for heart disease. He is a diabetic. We discussed dietary modification and graduated exercise.

## 2015-09-03 NOTE — Assessment & Plan Note (Signed)
He has chronic thrombocytopenia and intermittent leukopenia which I suspect could be due to sequestration/fatty liver disease. He is not symptomatic. Observe only. Serum vitamin B12 recently was negative

## 2015-09-03 NOTE — Progress Notes (Signed)
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 (7+3)   10/01/2013 -  Chemotherapy He had repeat induction chemotherapy due to persistent disease (5+2)   11/06/2013 Bone Marrow Biopsy Bone marrow biopsy confirmed remission   11/13/2013 - 11/17/2013 Chemotherapy He received cycle 1 of consolidation chemotherapy with HiDAC   12/18/2013 - 12/22/2013 Chemotherapy He received cycle 2 of consolidation chemotherapy with HiDAC   01/23/2014 - 01/28/2014 Chemotherapy he received cycle 3 of consolidation chemotherapy with HiDAC   03/05/2014 - 03/10/2014 Hospital Admission he received cycle 4 of consolidation chemotherapy with HiDAC    INTERVAL HISTORY: Please see below for problem oriented charting. He returns for follow-up. He denies recent infection. The patient denies any recent signs or symptoms of bleeding such as spontaneous epistaxis, hematuria or hematochezia. According to the patient, he is still working. He is not able to lose weight despite eating on average only one meal a day and walking the dog on a regular basis  REVIEW OF SYSTEMS:   Constitutional: Denies fevers, chills or abnormal weight loss Eyes: Denies blurriness of vision Ears, nose, mouth, throat, and face: Denies mucositis or sore throat Respiratory: Denies cough, dyspnea or wheezes Cardiovascular: Denies palpitation, chest discomfort or lower extremity swelling Gastrointestinal:  Denies nausea, heartburn or change in bowel habits Skin: Denies abnormal skin rashes Lymphatics: Denies new lymphadenopathy or easy bruising Neurological:Denies numbness, tingling or new weaknesses Behavioral/Psych: Mood is stable, no new changes   All other systems were reviewed with the patient and are negative.  I have reviewed the past medical history, past surgical history, social history and family history with the patient and they are unchanged from previous note.  ALLERGIES:  is allergic to morphine and related and prochlorperazine.  MEDICATIONS:  Current Outpatient Prescriptions  Medication Sig Dispense Refill  . aspirin 81 MG tablet Take 81 mg by mouth daily.    Marland Kitchen atenolol (TENORMIN) 25 MG tablet Take 0.5 tablets (12.5 mg total) by mouth daily. 45 tablet 3  . glucose blood (FREESTYLE LITE) test strip Test once daily dx 250.00 100 each 3  . insulin lispro protamine-lispro (HUMALOG 75/25 MIX) (75-25) 100 UNIT/ML SUSP injection Inject 35 Units into the skin at bedtime. 4 vial 3  . INSULIN SYRINGE .5CC/29G (B-D INSULIN SYRINGE) 29G X 1/2" 0.5 ML MISC Use once daily, dx 250.00 100 each 3  . metFORMIN (GLUCOPHAGE) 500 MG tablet Take 1 tablet (500 mg total) by mouth daily with breakfast. (Patient taking differently: Take 500 mg by mouth daily after supper. ) 90 tablet 3  . simvastatin (ZOCOR) 20 MG tablet Take 1 tablet (20 mg total) by mouth at bedtime. 90 tablet 3   No current facility-administered medications for this visit.   Facility-Administered Medications Ordered in Other Visits  Medication Dose Route Frequency Provider Last Rate Last Dose  . heparin lock flush 100 unit/mL  500 Units Intravenous Once Brunetta Genera, MD      . sodium chloride 0.9 % injection 10 mL  10 mL Intravenous PRN Suzan Slick Juleen China, MD        PHYSICAL EXAMINATION: ECOG PERFORMANCE STATUS: 0 - Asymptomatic  Filed Vitals:   09/03/15 1417  BP: 130/67  Pulse: 80  Temp: 98.2 F (36.8 C)  Resp: 18   Filed Weights   09/03/15 1417  Weight: 208 lb 1.6 oz (94.394 kg)    GENERAL:alert, no distress and comfortable. He is obese SKIN: skin color, texture, turgor are normal, no rashes or significant lesions EYES: normal, Conjunctiva are pink  and non-injected, sclera clear OROPHARYNX:no exudate, no erythema and lips, buccal mucosa, and tongue normal  NECK: supple, thyroid normal size, non-tender, without nodularity LYMPH:  no palpable lymphadenopathy in the cervical, axillary or inguinal LUNGS: clear to auscultation and percussion with normal breathing effort HEART: regular rate & rhythm and no murmurs and no lower extremity edema ABDOMEN:abdomen soft, non-tender and normal bowel sounds Musculoskeletal:no cyanosis of digits and no clubbing  NEURO: alert & oriented x 3 with fluent speech, no focal motor/sensory deficits  LABORATORY DATA:  I have reviewed the data as listed    Component Value Date/Time   NA 140 04/15/2015 1318   NA 139 03/05/2015 1310   K 5.0 04/15/2015 1318   K 4.2 03/05/2015 1310   CL 103 04/15/2015 1318   CO2 31 04/15/2015 1318   CO2 28 03/05/2015 1310   GLUCOSE 162* 04/15/2015 1318   GLUCOSE 239* 03/05/2015 1310   BUN 15 04/15/2015 1318   BUN 13.0 03/05/2015 1310   CREATININE 0.88 04/15/2015 1318   CREATININE 1.0 03/05/2015 1310   CALCIUM 9.5 04/15/2015 1318   CALCIUM 9.2 03/05/2015 1310   PROT 6.9 03/05/2015 1310   PROT 6.4 10/16/2014 0933   ALBUMIN 3.8 03/05/2015 1310   ALBUMIN 3.9 10/16/2014 0933   AST 18 03/05/2015 1310   AST 17 10/16/2014 0933   ALT 22 03/05/2015 1310   ALT 23 10/16/2014 0933   ALKPHOS 82 03/05/2015 1310   ALKPHOS 77 10/16/2014 0933   BILITOT 0.85 03/05/2015 1310   BILITOT 0.7 10/16/2014 0933   GFRNONAA 71.09 01/24/2010 0753   GFRAA 78 01/20/2008 0755    No results found for: SPEP, UPEP  Lab Results  Component Value Date   WBC 3.8* 09/03/2015   NEUTROABS 2.8 09/03/2015   HGB 15.0 09/03/2015   HCT 41.9 09/03/2015   MCV 94.2 09/03/2015   PLT 121* 09/03/2015      Chemistry      Component Value Date/Time   NA 140 04/15/2015 1318   NA 139 03/05/2015 1310   K 5.0 04/15/2015 1318   K 4.2 03/05/2015 1310   CL 103 04/15/2015 1318   CO2 31 04/15/2015 1318    CO2 28 03/05/2015 1310   BUN 15 04/15/2015 1318   BUN 13.0 03/05/2015 1310   CREATININE 0.88 04/15/2015 1318   CREATININE 1.0 03/05/2015 1310      Component Value Date/Time   CALCIUM 9.5 04/15/2015 1318   CALCIUM 9.2 03/05/2015 1310   ALKPHOS 82 03/05/2015 1310   ALKPHOS 77 10/16/2014 0933   AST 18 03/05/2015 1310   AST 17 10/16/2014 0933   ALT 22 03/05/2015 1310   ALT 23 10/16/2014 0933   BILITOT 0.85 03/05/2015 1310   BILITOT 0.7 10/16/2014 0933      ASSESSMENT & PLAN:  AML (acute myeloid leukemia) in remission He is not symptomatic. His blood work confirmed the patient remained in remission I plan to see him back in 3 months.    Pancytopenia, acquired [facility]) He has chronic thrombocytopenia and intermittent leukopenia which I suspect could be due to sequestration/fatty liver disease. He is not symptomatic. Observe only. Serum vitamin B12 recently was negative  Obesity, Class II, BMI 35-39.9, with comorbidity ([place]) The patient is morbidly obese with risk factors for heart disease. He is a diabetic. We discussed dietary modification and graduated exercise.   Orders Placed This Encounter  Procedures  . CBC with Differential/Platelet    Standing Status: Future     Number of Occurrences:      Standing Expiration Date: 10/07/2016  . Comprehensive metabolic panel    Standing Status: Future     Number of Occurrences:      Standing Expiration Date: 10/07/2016   All questions were answered. The patient knows to call the clinic with any problems, questions or concerns. No barriers to learning was detected. I spent 15 minutes counseling the patient face to face. The total time spent in the appointment was 20 minutes and more than 50% was on counseling and review of test results     [facility], [place], MD 09/03/2015 3:15 PM

## 2015-09-03 NOTE — Assessment & Plan Note (Signed)
He is not symptomatic. His blood work confirmed the patient remained in remission I plan to see him back in 3 months.

## 2015-10-19 ENCOUNTER — Other Ambulatory Visit: Payer: Managed Care, Other (non HMO)

## 2015-10-21 ENCOUNTER — Other Ambulatory Visit (INDEPENDENT_AMBULATORY_CARE_PROVIDER_SITE_OTHER): Payer: Managed Care, Other (non HMO)

## 2015-10-21 DIAGNOSIS — Z0000 Encounter for general adult medical examination without abnormal findings: Secondary | ICD-10-CM | POA: Diagnosis not present

## 2015-10-21 DIAGNOSIS — E119 Type 2 diabetes mellitus without complications: Secondary | ICD-10-CM

## 2015-10-21 LAB — POC URINALSYSI DIPSTICK (AUTOMATED)
BILIRUBIN UA: NEGATIVE
KETONES UA: NEGATIVE
Leukocytes, UA: NEGATIVE
Nitrite, UA: NEGATIVE
Protein, UA: NEGATIVE
RBC UA: NEGATIVE
SPEC GRAV UA: 1.02
Urobilinogen, UA: 0.2
pH, UA: 5

## 2015-10-21 LAB — CBC WITH DIFFERENTIAL/PLATELET
BASOS ABS: 0 10*3/uL (ref 0.0–0.1)
Basophils Relative: 0.3 % (ref 0.0–3.0)
Eosinophils Absolute: 0 10*3/uL (ref 0.0–0.7)
Eosinophils Relative: 1.1 % (ref 0.0–5.0)
HCT: 44.8 % (ref 39.0–52.0)
HEMOGLOBIN: 15.6 g/dL (ref 13.0–17.0)
LYMPHS ABS: 0.8 10*3/uL (ref 0.7–4.0)
Lymphocytes Relative: 20.9 % (ref 12.0–46.0)
MCHC: 34.7 g/dL (ref 30.0–36.0)
MCV: 97.7 fl (ref 78.0–100.0)
MONO ABS: 0.4 10*3/uL (ref 0.1–1.0)
MONOS PCT: 10.6 % (ref 3.0–12.0)
NEUTROS PCT: 67.1 % (ref 43.0–77.0)
Neutro Abs: 2.5 10*3/uL (ref 1.4–7.7)
Platelets: 148 10*3/uL — ABNORMAL LOW (ref 150.0–400.0)
RBC: 4.59 Mil/uL (ref 4.22–5.81)
RDW: 14.8 % (ref 11.5–15.5)
WBC: 3.8 10*3/uL — AB (ref 4.0–10.5)

## 2015-10-21 LAB — HEPATIC FUNCTION PANEL
ALK PHOS: 61 U/L (ref 39–117)
ALT: 25 U/L (ref 0–53)
AST: 20 U/L (ref 0–37)
Albumin: 4 g/dL (ref 3.5–5.2)
BILIRUBIN TOTAL: 0.9 mg/dL (ref 0.2–1.2)
Bilirubin, Direct: 0.2 mg/dL (ref 0.0–0.3)
Total Protein: 6.3 g/dL (ref 6.0–8.3)

## 2015-10-21 LAB — LIPID PANEL
Cholesterol: 130 mg/dL (ref 0–200)
HDL: 44.7 mg/dL (ref >39.00)
LDL CALC: 71 mg/dL (ref 0–99)
NONHDL: 85.31
Total CHOL/HDL Ratio: 3
Triglycerides: 74 mg/dL (ref 0.0–149.0)
VLDL: 14.8 mg/dL (ref 0.0–40.0)

## 2015-10-21 LAB — BASIC METABOLIC PANEL
BUN: 15 mg/dL (ref 6–23)
CHLORIDE: 104 meq/L (ref 96–112)
CO2: 30 mEq/L (ref 19–32)
Calcium: 9 mg/dL (ref 8.4–10.5)
Creatinine, Ser: 1.05 mg/dL (ref 0.40–1.50)
GFR: 73.76 mL/min (ref >60.00)
GLUCOSE: 151 mg/dL — AB (ref 70–99)
POTASSIUM: 4.8 meq/L (ref 3.5–5.1)
SODIUM: 139 meq/L (ref 135–145)

## 2015-10-21 LAB — TSH: TSH: 3.22 u[IU]/mL (ref 0.35–4.50)

## 2015-10-21 LAB — MICROALBUMIN / CREATININE URINE RATIO
CREATININE, U: 138.5 mg/dL
MICROALB/CREAT RATIO: 1.2 mg/g (ref 0.0–30.0)
Microalb, Ur: 1.7 mg/dL (ref 0.0–1.9)

## 2015-10-21 LAB — HEMOGLOBIN A1C: Hgb A1c MFr Bld: 7.8 % — ABNORMAL HIGH (ref 4.6–6.5)

## 2015-10-21 LAB — PSA: PSA: 3.6 ng/mL (ref 0.10–4.00)

## 2015-10-26 ENCOUNTER — Ambulatory Visit (INDEPENDENT_AMBULATORY_CARE_PROVIDER_SITE_OTHER): Payer: Managed Care, Other (non HMO) | Admitting: Family Medicine

## 2015-10-26 ENCOUNTER — Encounter: Payer: Self-pay | Admitting: Family Medicine

## 2015-10-26 VITALS — BP 128/76 | HR 94 | Temp 99.1°F | Ht 66.75 in | Wt 209.9 lb

## 2015-10-26 DIAGNOSIS — IMO0001 Reserved for inherently not codable concepts without codable children: Secondary | ICD-10-CM

## 2015-10-26 DIAGNOSIS — E785 Hyperlipidemia, unspecified: Secondary | ICD-10-CM | POA: Diagnosis not present

## 2015-10-26 DIAGNOSIS — E139 Other specified diabetes mellitus without complications: Secondary | ICD-10-CM

## 2015-10-26 DIAGNOSIS — M545 Low back pain, unspecified: Secondary | ICD-10-CM

## 2015-10-26 DIAGNOSIS — E109 Type 1 diabetes mellitus without complications: Secondary | ICD-10-CM

## 2015-10-26 DIAGNOSIS — Z23 Encounter for immunization: Secondary | ICD-10-CM

## 2015-10-26 DIAGNOSIS — I10 Essential (primary) hypertension: Secondary | ICD-10-CM | POA: Diagnosis not present

## 2015-10-26 DIAGNOSIS — Z0001 Encounter for general adult medical examination with abnormal findings: Secondary | ICD-10-CM

## 2015-10-26 DIAGNOSIS — I080 Rheumatic disorders of both mitral and aortic valves: Secondary | ICD-10-CM

## 2015-10-26 DIAGNOSIS — C9201 Acute myeloblastic leukemia, in remission: Secondary | ICD-10-CM

## 2015-10-26 MED ORDER — SIMVASTATIN 20 MG PO TABS: 20.0000 mg | ORAL_TABLET | Freq: Every day | ORAL | 3 refills | Status: DC

## 2015-10-26 MED ORDER — METFORMIN HCL 500 MG PO TABS: 500.0000 mg | ORAL_TABLET | Freq: Every day | ORAL | 3 refills | Status: DC

## 2015-10-26 MED ORDER — ATENOLOL 25 MG PO TABS: 12.5000 mg | ORAL_TABLET | Freq: Every day | ORAL | 3 refills | Status: DC

## 2015-10-26 MED ORDER — INSULIN LISPRO PROT & LISPRO (75-25 MIX) 100 UNIT/ML ~~LOC~~ SUSP: 45.0000 [IU] | Freq: Every day | SUBCUTANEOUS | 3 refills | Status: DC

## 2015-10-26 NOTE — Patient Instructions (Signed)
Increase your insulin from 35 units nightly to 45 units nightly  Walk 30 minutes daily  Eliminate all carbohydrates,,,,,,,,,,,,,, no high fructose corn syrup,,,,,,,,, goal to lose 12 pounds in the next 12 months  For your back pain I would recommend a physical therapy evaluation and treatment program, also. Motrin 600 mg twice daily with food,  Follow-up in 3 months  Nonfasting labs one week prior

## 2015-10-26 NOTE — Progress Notes (Signed)
David Carr is a 70 year old married male nonsmoker who comes in today for general physical examination because of a history of hypertension, type 1 diabetes, hyperlipidemia, obesity, chronic leukemia, and a new problem of low back pain  He takes Tenormin 12.5 mg daily for hypertension BP is running 120/76.  He takes insulin 7525 mix dose 35 units nightly along with metformin 500 mg at breakfast. A1c down to 7.8. It was 8.0 in March. His weight is unchanged 208 pounds. He's not following a strict diet or exercise program.  He takes Zocor and baby aspirin for hyperlipidemia lipids are at goal with an LDL 71  His chronic leukemia followed by Dr. Darnell Level. in the oncology center.  He states for the past month he's had low back pain. He's not recall when it started. He says is dull sometimes sharp when it's at its worst it's an 8 today it's a 3. It comes and goes. He points the right flank of the source of his pain. He thinks he passed a kidney stone when this initially started but the pain hasn't dissipated. It does not radiate. The pain is decreased by rest and increased by bending. No previous history of lumbar back pain trauma weight loss night sweats etc.  He gets routine eye care, dental care, colonoscopy 2009 normal  Vaccinations up-to-date  Cognitive function normal he does not exercise on a regular basis home health safety reviewed no issues identified, no guns in the house, he does have a healthcare power of attorney and living well. He does see his ophthalmologist yearly for an eye exam.  GU systems otherwise negative  Physical examination vital signs stable he is afebrile except his weight is 208 pounds obese class II with a BMI 36.90. HEENT were negative neck was supple no adenopathy thyroid not enlarged no carotid bruits cardiopulmonary exam normal except for grade 2/6 systolic ejection murmur heard best at the apex. Abdominal exam is negative except for massive panniculus an umbilical  hernia. Genitalia normal circumcised male  Rectum normal stool guaiac-negative prostate normal extremity normal skin no peripheral pulses normal.  Impression  #1 diabetes type 1 not at goal,,,,,,,,,, increase insulin to 40 units daily,,,,,, continue metformin 500 mg daily he cannot tolerate it anymore because of GI side effects. Again stressed diet exercise follow-up A1c in 3 months  #2 hypertension at goal,,,,,,, continue current therapy  #3 hyperlipidemia at goal,,,,,,, continue current therapy  #4 obesity,,,,,,,, again stressed diet exercise and weight loss #5 chronic leukemia,,,,,,,,,,, continue follow-up in oncology no therapy at this time  #6 mitral regurg,,,,,,,, asymptomatic follow-up in cardiology every 2 years

## 2015-10-26 NOTE — Progress Notes (Signed)
Pre visit review using our clinic review tool, if applicable. No additional management support is needed unless otherwise documented below in the visit note. 

## 2015-10-28 ENCOUNTER — Encounter: Payer: Self-pay | Admitting: Physical Therapy

## 2015-10-28 ENCOUNTER — Ambulatory Visit: Payer: Managed Care, Other (non HMO) | Attending: Family Medicine | Admitting: Physical Therapy

## 2015-10-28 DIAGNOSIS — M62838 Other muscle spasm: Secondary | ICD-10-CM | POA: Diagnosis not present

## 2015-10-28 DIAGNOSIS — M545 Low back pain, unspecified: Secondary | ICD-10-CM

## 2015-10-28 NOTE — Patient Instructions (Addendum)
Supine    Lie on back, legs bent and feet flat. Grasp behind one leg and slowly try to straighten knee. Hold _30__ seconds.  Repeat _2__ times per session. Do _1__ sessions per day.  Copyright  VHI. All rights reserved.  Supine With Rotation    Lie, back flat, legs bent, feet together. Rotate knees to one side. Hold _5__ seconds. Repeat to other side. Repeat __10_ times per session. Do __1_ sessions per day.  Copyright  VHI. All rights reserved.  Supine Knee-to-Chest, Unilateral    Lie on back, hands clasped behind one knee. Pull knee in toward chest until a comfortable stretch is felt in lower back and buttocks. Hold __15_ seconds.  Repeat _2__ times per session. Do _1__ sessions per day.  Copyright  VHI. All rights reserved.  Piriformis Stretch, Sitting    Sit, one ankle on opposite knee, same-side hand on crossed knee. Push down on knee, keeping spine straight. Lean torso forward, with flat back, until tension is felt in hamstrings and gluteals of crossed-leg side. Hold 30___ seconds.  Repeat _2__ times per session. Do __1_ sessions per day.  Copyright  VHI. All rights reserved.  [facility] [address], [place], [place] [address] Phone # [phone] Fax [phone]

## 2015-10-28 NOTE — Therapy (Signed)
[facility] [address] [place], [place], [address] Phone: [phone]   Fax:  [phone]  Physical Therapy Evaluation  Patient Details  Name: David Carr. MRN: [id number] Date of Birth: 01/25/1946 Referring Provider: Dr. Stevie Kern  Encounter Date: 10/28/2015      PT End of Session - 10/28/15 1448    Visit Number 1   Number of Visits 10   Date for PT Re-Evaluation 12/09/15   Authorization Type medicare g-code 10th visit   PT Start Time [id number]   PT Stop Time 1520   PT Time Calculation (min) 35 min   Activity Tolerance Patient tolerated treatment well   Behavior During Therapy [facility] for tasks assessed/performed      Past Medical History:  Diagnosis Date  . Allergy   . AML (acute myeloid leukemia) in remission ([place]) 11/14/2013  . Asthma   . Diabetes mellitus   . Hyperlipidemia   . Hypertension   . Mitral insufficiency   . Nasal drainage 02/12/2014  . Other fatigue 01/19/2014  . Pancytopenia, acquired ([place]) 05/28/2015  . Thrombocytopenia, unspecified ([place]) 09/04/2013  . Urinary tract disorder    outlet obstruction following surgery    Past Surgical History:  Procedure Laterality Date  . ELBOW SURGERY    . INGUINAL HERNIA REPAIR     left  . ROTATOR CUFF REPAIR      There were no vitals filed for this visit.       Subjective Assessment - 10/28/15 1450    Subjective Patient reports has a history of 2 RTC surgeries making it difficulty to lay on his side. 2 months ago had aching in low back when he had kidney stones.  He passed the kidney stones.  Patient reached out to grab something and felt incresaed in lumbar pain.  Patient has pain across his back when he reaches behind him.  Last 1.5 weeks has been sleeping in recliner in a sitting postion due to pain.    How long can you sit comfortably? sit with back support   How long can you stand comfortably? stand with movement better than one spot   How long can you walk comfortably? No trouble walking.    Patient Stated Goals stop pain; sleep in bed   Currently in Pain? Yes   Pain Score 2    Pain Location Back   Pain Orientation Right;Left;Mid   Pain Descriptors / Indicators Aching   Pain Type Acute pain   Pain Onset More than a month ago   Pain Frequency Intermittent   Aggravating Factors  as the day goes on, reach behind his back, sit without back support   Pain Relieving Factors ice and heat   Multiple Pain Sites No            [facility] PT Assessment - 10/28/15 0001      Assessment   Medical Diagnosis M54.5 right sided low back pain without sciatica   Referring Provider Dr. Stevie Kern   Onset Date/Surgical Date 09/27/15   Prior Therapy Yes     Precautions   Precautions None     Restrictions   Weight Bearing Restrictions No     Balance Screen   Has the patient fallen in the past 6 months No   Has the patient had a decrease in activity level because of a fear of falling?  No   Is the patient reluctant to leave their home because of a fear of falling?  No     Home Ecologist residence     Prior Function   Level of Independence Independent   Vocation Part time employment   Vocation Requirements lift a door, moving around     [employer]   Overall Cognitive Status Within Functional Limits for tasks assessed     Observation/Other Assessments   Focus on Therapeutic Outcomes (FOTO)  33% limitation  24% limitation     ROM / Strength   AROM / PROM / Strength AROM;Strength     AROM   Lumbar - Right Side Bend full   Lumbar - Left Side Bend decreased by 25% with no c curve     Palpation   Spinal mobility decreased mobility of T10-L5; Decreased mobility of right lower rib cage posterior   Palpation comment tenderness located in bil. quadratus                           PT Education - 10/28/15 1521    Education provided Yes   Education Details flexibility  exercises   Person(s) Educated Patient   Methods Explanation;Demonstration;Handout;Verbal cues   Comprehension Returned demonstration;Verbalized understanding          PT Short Term Goals - 10/28/15 1528      PT SHORT TERM GOAL #1   Title Independent with initial HEP   Time 3   Period Weeks   Status New     PT SHORT TERM GOAL #2   Title understand correct body mechanics with home and work tasks to decrease strain on lumbar spine   Time 3   Period Weeks   Status New     PT SHORT TERM GOAL #3   Title pain with daily activities decreased >/= 25%   Time 3   Period Weeks   Status New           PT Long Term Goals - 10/28/15 1518      PT LONG TERM GOAL #1   Title independent with HEP   Time 6   Period Weeks   Status New     PT LONG TERM GOAL #2   Title sleep in bed on side for 1 week   Time 6   Period Weeks   Status New     PT LONG TERM GOAL #3   Title squat to lift up items with proper body mechanics with no pain   Time 6   Period Weeks   Status New     PT LONG TERM GOAL #4   Title stand and reach behind to the right with no pain due to increased spinal mobility   Time 6   Period Weeks   Status New     PT LONG TERM GOAL #5   Title be able to perform daily activities with minimal to no pain   Time 6   Period Weeks   Status New               Plan - 10/28/15 1521    Clinical Impression Statement Patient is a 70 year old male with diagnosis of lumbar pain without sciatica for 4 weeks with sudden onset. Patient reports intermittent pain is at level 2/10 preventing him from sleeping in his bed  instead in a recliner upright.  Patient unable to reach behind himself with right hand due to pain.  Lumbar left sidebending decreased by 25% with no c curve.  Decresaed  mobility of T10 to L5 and lower right posterior rib cage.  Palpable tenderness located in righ tquadratus and lower right rib cage.  Patient is moderate complexity evaluation due to an evolving  condition and comorbidities such as bilateral rotator cuff surgery, unable to sleep in his bed, and reach behind himself to the right that will impact care provided.    Rehab Potential Excellent   Clinical Impairments Affecting Rehab Potential None   PT Frequency 2x / week   PT Duration 6 weeks   PT Treatment/Interventions Cryotherapy;Electrical Stimulation;Ultrasound;Moist Heat;Traction;Therapeutic activities;Therapeutic exercise;Neuromuscular re-education;Patient/family education;Passive range of motion;Manual techniques;Dry needling   PT Next Visit Plan mobilization to T10 to L5, Dry needling to right quadratus and lower right posterior rib cage, back stabilization, soft tissue work   PT Home Exercise Plan body mechanincs; abdominal bracing   Recommended Other Services None   Consulted and Agree with Plan of Care Patient      Patient will benefit from skilled therapeutic intervention in order to improve the following deficits and impairments:  Decreased range of motion, Increased fascial restricitons, Increased muscle spasms, Decreased activity tolerance, Decreased strength, Decreased mobility, Impaired flexibility  Visit Diagnosis: Other muscle spasm - Plan: PT plan of care cert/re-cert  Midline low back pain without sciatica - Plan: PT plan of care cert/re-cert      G-Codes - [id number]    Functional Assessment Tool Used FOTO score is 33% limitation  goal is 24% limitation   Functional Limitation Mobility: Walking and moving around   Mobility: Walking and Moving Around Current Status [id number]) At least 20 percent but less than 40 percent impaired, limited or restricted   Mobility: Walking and Moving Around Goal Status [phone]) At least 20 percent but less than 40 percent impaired, limited or restricted       Problem List Patient Active Problem List   Diagnosis Date Noted  . Obesity, Class II, BMI 35-39.9, with comorbidity ([place]) 09/03/2015  . Pancytopenia, acquired ([place])  05/28/2015  . Diabetes mellitus type 1, controlled, without complications ([place]) [id number]  . Nasal drainage 02/12/2014  . Leukopenia due to antineoplastic chemotherapy 01/30/2014  . Other fatigue 01/19/2014  . Chills (without fever) 01/19/2014  . AML (acute myeloid leukemia) in remission ([place]) 11/14/2013  . Thrombocytopenia ([place]) 09/04/2013  . Anemia in neoplastic disease 09/04/2013  . Anemia, unspecified 08/28/2013  . Chest pain 07/10/2013  . Dysplastic nevus of face 08/09/2010  . Overweight 01/19/2009  . VERTIGO, POSITIONAL 03/18/2008  . MITRAL REGURGITATION 07/16/2007  . Hyperlipidemia 05/31/2007  . Essential hypertension 05/31/2007  . ALLERGIC RHINITIS 05/31/2007  . SEBORRHEIC KERATOSIS 05/31/2007  . SHOULDER PAIN, RIGHT 05/31/2007    Earlie Counts, PT 10/28/15 3:35 PM   [facility] [address] [place], [place], [address] Phone: [phone]   Fax:  [phone]  Name: David Carr. MRN: [id number] Date of Birth: Mar 21, 1945

## 2015-11-02 ENCOUNTER — Ambulatory Visit: Payer: Managed Care, Other (non HMO) | Admitting: Physical Therapy

## 2015-11-02 DIAGNOSIS — M545 Low back pain, unspecified: Secondary | ICD-10-CM

## 2015-11-02 DIAGNOSIS — M62838 Other muscle spasm: Secondary | ICD-10-CM | POA: Diagnosis not present

## 2015-11-02 NOTE — Therapy (Addendum)
[facility] [address] [place], [place], [address] Phone: [phone]   Fax:  [phone]  Physical Therapy Treatment/Discharge Summary  Patient Details  Name: David Carr. MRN: [id number] Date of Birth: [birth year] Referring Provider: Dr. Stevie Kern  Encounter Date: 11/02/2015    Past Medical History:  Diagnosis Date  . Allergy   . 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 Asthma   . Diabetes mellitus   . Hyperlipidemia   . Hypertension   . Mitral insufficiency   . Nasal drainage 02/12/2014  . Other fatigue 01/19/2014  . Pancytopenia, acquired ([place]) 05/28/2015  . Thrombocytopenia, unspecified ([place]) 09/04/2013  . Urinary tract disorder    outlet obstruction following surgery    Past Surgical History:  Procedure Laterality Date  . ELBOW SURGERY    . INGUINAL HERNIA REPAIR     left  . ROTATOR CUFF REPAIR      There were no vitals filed for this visit.      Subjective Assessment - 11/02/15 1534    Subjective Felt good after first visit.  Tried to sleep in bed, made it until 2 am when bilateral low back pain again.  Patient thought skin rolling last visit really helped.     Currently in Pain? Yes   Pain Score 2    Pain Location Back   Pain Orientation Right;Left   Pain Type Acute pain                         [facility] Adult PT Treatment/Exercise - 11/02/15 0001      Moist Heat Therapy   Number Minutes Moist Heat 15 Minutes   Moist Heat Location Lumbar Spine     Electrical Stimulation   Electrical Stimulation Location lumbar bilateral   Electrical Stimulation Action IFC   Electrical Stimulation Parameters 8 ma 15 min   Electrical Stimulation Goals Pain     Manual Therapy   Manual Therapy Joint mobilization;Soft tissue mobilization;Myofascial release   Joint Mobilization neutral gapping and pelvic distraction in sidelying grade 3 20 sec 3x;  supine long  axis hip distraction grade 3 20sec 3x right/left   Soft tissue mobilization lumbar paraspinals and quadruatus lumborum   Myofascial Release skin rolling bilateral lumbar 3x right and left;  Graston instrument assisted G4 to lumbar paraspinals and QL                  PT Short Term Goals - 11/02/15 2137      PT SHORT TERM GOAL #1   Title Independent with initial HEP   Time 3   Period Weeks   Status On-going     PT SHORT TERM GOAL #2   Title understand correct body mechanics with home and work tasks to decrease strain on lumbar spine   Time 3   Period Weeks   Status On-going     PT SHORT TERM GOAL #3   Title pain with daily activities decreased >/= 25%   Time 3   Period Weeks   Status On-going           PT Long Term Goals - 11/02/15 2137      PT LONG TERM GOAL #1   Title independent with HEP   Time 6   Period Weeks   Status On-going     PT LONG TERM GOAL #2   Title sleep in bed on side for 1 week  Time 6   Period Weeks   Status On-going     PT LONG TERM GOAL #3   Title squat to lift up items with proper body mechanics with no pain   Time 6   Period Weeks   Status On-going     PT LONG TERM GOAL #4   Title stand and reach behind to the right with no pain due to increased spinal mobility   Time 6   Period Weeks   Status On-going     PT LONG TERM GOAL #5   Title be able to perform daily activities with minimal to no pain   Time 6   Period Weeks   Status On-going               Plan - 11/02/15 2134    Clinical Impression Statement The patient has tenderness in bilateral quadratus lumborum and paraspinal muscles.  Discussed dry needling with patient but he defers at this time preferring manual techniques only at this time.  His chronic bilateral shoulder pathology limits his mobility with turning and lying prone.  Therapist closely monitoring response with all interventions.     PT Next Visit Plan mobilization to T10 to L5, back  stabilization, soft tissue work particularly skin rolling;  assess response to e-stim/heat;  add QL stretching to HEP      Patient will benefit from skilled therapeutic intervention in order to improve the following deficits and impairments:     Visit Diagnosis: Other muscle spasm  Midline low back pain without sciatica  PHYSICAL THERAPY DISCHARGE SUMMARY  Visits from Start of Care: 2  Current functional level related to goals / functional outcomes: The patient requested discharge from PT no reason given.     Remaining deficits: As above   Education / Equipment: Very basic self care strategies  Plan: Patient agrees to discharge.  Patient goals were not met. Patient is being discharged due to the patient's request.  ?????    G code:  Walking/mobility  Goal CJ, Discharge CJ     Problem List Patient Active Problem List   Diagnosis Date Noted  . Obesity, Class II, BMI 35-39.9, with comorbidity ([place]) 09/03/2015  . Pancytopenia, acquired ([place]) 05/28/2015  . Diabetes mellitus type 1, controlled, without complications ([place]) 94/44/6190  . Nasal drainage 02/12/2014  . Leukopenia due to antineoplastic chemotherapy 01/30/2014  . Other fatigue 01/19/2014  . Chills (without fever) 01/19/2014  . AML (acute myeloid leukemia) in remission ([place]) 11/14/2013  . Thrombocytopenia ([place]) 09/04/2013  . Anemia in neoplastic disease 09/04/2013  . Anemia, unspecified 08/28/2013  . Chest pain 07/10/2013  . Dysplastic nevus of face 08/09/2010  . Overweight 01/19/2009  . VERTIGO, POSITIONAL 03/18/2008  . MITRAL REGURGITATION 07/16/2007  . Hyperlipidemia 05/31/2007  . Essential hypertension 05/31/2007  . ALLERGIC RHINITIS 05/31/2007  . SEBORRHEIC KERATOSIS 05/31/2007  . SHOULDER PAIN, RIGHT 05/31/2007   Ruben Im, PT 11/02/15 9:39 PM Phone: [phone] Fax: [phone]  Alvera Singh 11/02/2015, 9:39 PM  [facility] [address] [place], [place], [address] Phone: [phone]   Fax:  [phone]  Name: David Carr. MRN: [id number] Date of Birth: February 24, 1945

## 2015-11-08 ENCOUNTER — Encounter: Payer: Managed Care, Other (non HMO) | Admitting: Physical Therapy

## 2015-11-11 ENCOUNTER — Encounter: Payer: Managed Care, Other (non HMO) | Admitting: Physical Therapy

## 2015-11-15 ENCOUNTER — Encounter: Payer: Managed Care, Other (non HMO) | Admitting: Physical Therapy

## 2015-11-18 ENCOUNTER — Encounter: Payer: Managed Care, Other (non HMO) | Admitting: Physical Therapy

## 2015-11-22 ENCOUNTER — Encounter: Payer: Managed Care, Other (non HMO) | Admitting: Physical Therapy

## 2015-11-23 ENCOUNTER — Encounter: Payer: Self-pay | Admitting: Adult Health

## 2015-11-23 ENCOUNTER — Ambulatory Visit (INDEPENDENT_AMBULATORY_CARE_PROVIDER_SITE_OTHER): Payer: Managed Care, Other (non HMO) | Admitting: Adult Health

## 2015-11-23 VITALS — BP 142/60 | Temp 98.2°F | Ht 66.75 in | Wt 209.0 lb

## 2015-11-23 DIAGNOSIS — B029 Zoster without complications: Secondary | ICD-10-CM | POA: Diagnosis not present

## 2015-11-23 NOTE — Progress Notes (Signed)
   Subjective:    Patient ID: David Carr., male    DOB: 02/25/1945, 70 y.o.   MRN: [id number]  HPI  70 year old male who presents to the office for follow up regarding shingles around his right eye. He reports that his shingles outbreak started 5 days ago and 3 days ago he went to an urgent care where he was prescribed gabapentin and valtrex. He has also followed up with his eye doctor yesterday.   Today in the office he reports that he work up with swelling around his right eye, which has since resolved.   His pain has improved and he is only taking Gabapentin as needed.   Review of Systems  Constitutional: Negative.   Eyes: Negative.   Respiratory: Negative.   Cardiovascular: Negative.   Skin: Positive for color change and rash.  Neurological: Negative.   Hematological: Negative.   All other systems reviewed and are negative.      Objective:   Physical Exam  Constitutional: He is oriented to person, place, and time. He appears well-developed and well-nourished. No distress.  Eyes: Conjunctivae and EOM are normal. Pupils are equal, round, and reactive to light. Right eye exhibits no discharge. Left eye exhibits no discharge. No scleral icterus.  Cardiovascular: Normal rate, regular rhythm, normal heart sounds and intact distal pulses.  Exam reveals no gallop.   No murmur heard. Pulmonary/Chest: Effort normal and breath sounds normal. No respiratory distress. He has no wheezes. He has no rales. He exhibits no tenderness.  Neurological: He is alert and oriented to person, place, and time.  Skin: Skin is warm and dry. Rash (shingles rash that follows the V1 dermatome. Blisters have started to crust over ) noted. He is not diaphoretic.  Psychiatric: He has a normal mood and affect. His behavior is normal. Judgment and thought content normal.  Nursing note and vitals reviewed.     Assessment & Plan:  1. Herpes zoster without complication - Continue with Valtrex and  Gabapentin  - Can take Ibuprofen or tylenol  - Cold compresses - Follow up as needed  Dorothyann Peng, NP

## 2015-11-25 ENCOUNTER — Encounter: Payer: Managed Care, Other (non HMO) | Admitting: Physical Therapy

## 2015-12-02 ENCOUNTER — Other Ambulatory Visit (HOSPITAL_BASED_OUTPATIENT_CLINIC_OR_DEPARTMENT_OTHER): Payer: Managed Care, Other (non HMO)

## 2015-12-02 ENCOUNTER — Encounter: Payer: Self-pay | Admitting: Hematology and Oncology

## 2015-12-02 ENCOUNTER — Ambulatory Visit (HOSPITAL_BASED_OUTPATIENT_CLINIC_OR_DEPARTMENT_OTHER): Payer: Managed Care, Other (non HMO) | Admitting: Hematology and Oncology

## 2015-12-02 DIAGNOSIS — B029 Zoster without complications: Secondary | ICD-10-CM | POA: Diagnosis not present

## 2015-12-02 DIAGNOSIS — C9201 Acute myeloblastic leukemia, in remission: Secondary | ICD-10-CM

## 2015-12-02 DIAGNOSIS — E669 Obesity, unspecified: Secondary | ICD-10-CM

## 2015-12-02 DIAGNOSIS — IMO0001 Reserved for inherently not codable concepts without codable children: Secondary | ICD-10-CM

## 2015-12-02 LAB — COMPREHENSIVE METABOLIC PANEL
ALT: 28 U/L (ref 0–55)
AST: 21 U/L (ref 5–34)
Albumin: 3.5 g/dL (ref 3.5–5.0)
Alkaline Phosphatase: 79 U/L (ref 40–150)
Anion Gap: 10 mEq/L (ref 3–11)
BILIRUBIN TOTAL: 0.61 mg/dL (ref 0.20–1.20)
BUN: 14.2 mg/dL (ref 7.0–26.0)
CO2: 24 meq/L (ref 22–29)
Calcium: 9 mg/dL (ref 8.4–10.4)
Chloride: 104 mEq/L (ref 98–109)
Creatinine: 1 mg/dL (ref 0.7–1.3)
EGFR: 71 mL/min/{1.73_m2} — AB (ref >90)
GLUCOSE: 252 mg/dL — AB (ref 70–140)
Potassium: 4.1 mEq/L (ref 3.5–5.1)
SODIUM: 138 meq/L (ref 136–145)
TOTAL PROTEIN: 6.6 g/dL (ref 6.4–8.3)

## 2015-12-02 LAB — CBC WITH DIFFERENTIAL/PLATELET
BASO%: 0.3 % (ref 0.0–2.0)
Basophils Absolute: 0 10*3/uL (ref 0.0–0.1)
EOS ABS: 0 10*3/uL (ref 0.0–0.5)
EOS%: 0.6 % (ref 0.0–7.0)
HCT: 45.2 % (ref 38.4–49.9)
HGB: 15.4 g/dL (ref 13.0–17.1)
LYMPH%: 23.1 % (ref 14.0–49.0)
MCH: 33.1 pg (ref 27.2–33.4)
MCHC: 34 g/dL (ref 32.0–36.0)
MCV: 97.5 fL (ref 79.3–98.0)
MONO#: 0.3 10*3/uL (ref 0.1–0.9)
MONO%: 6.5 % (ref 0.0–14.0)
NEUT%: 69.5 % (ref 39.0–75.0)
NEUTROS ABS: 3.4 10*3/uL (ref 1.5–6.5)
Platelets: 141 10*3/uL (ref 140–400)
RBC: 4.64 10*6/uL (ref 4.20–5.82)
RDW: 14.5 % (ref 11.0–14.6)
WBC: 5 10*3/uL (ref 4.0–10.3)
lymph#: 1.1 10*3/uL (ref 0.9–3.3)

## 2015-12-02 NOTE — Assessment & Plan Note (Signed)
The patient is morbidly obese with risk factors for heart disease. He is a diabetic. We discussed dietary modification and graduated exercise.

## 2015-12-02 NOTE — Assessment & Plan Note (Signed)
He has recent zoster outbreak affecting the right eyebrow area. Overall, he is feeling good and it is healing well. He denies pain. I do not recommend him to undergo prophylactic vaccination against herpes zoster due to his prior history of AML. He has recently received influenza vaccination

## 2015-12-02 NOTE — Assessment & Plan Note (Addendum)
He is not symptomatic. His blood work confirmed the patient remained in remission Currently, the patient has been in remission for 2 years He has appointment to see his primary care doctor in 2 months I plan to see him back in 6 months

## 2015-12-02 NOTE — Progress Notes (Signed)
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, NPM1 positive      09/17/2013 -  Chemotherapy    He was given induction chemotherapy at [facility] (7+3)      10/01/2013 -  Chemotherapy    He had repeat induction chemotherapy due to persistent disease (5+2)      11/06/2013 Bone Marrow Biopsy    Bone marrow biopsy confirmed remission      11/13/2013 - 11/17/2013 Chemotherapy    He received cycle 1 of consolidation chemotherapy with [facility]      12/18/2013 - 12/22/2013 Chemotherapy    He received cycle 2 of consolidation chemotherapy with [facility]      01/23/2014 - 01/28/2014 Chemotherapy    he received cycle 3 of consolidation chemotherapy with [facility]      03/05/2014 - 03/10/2014 Hospital Admission    he received cycle 4 of consolidation chemotherapy with [facility]       INTERVAL HISTORY: Please see below for problem oriented charting. The patient had recent outbreak of zoster affecting the right forehead. He was prescribed antiviral treatment and appears to be doing well. He denies visual changes. Prior to that, he has cold like illness which has subsequently resolved He has recently received influenza vaccination. He is exercising on a regular basis. No change in in appetite or weight  REVIEW OF SYSTEMS:   Constitutional: Denies fevers, chills or abnormal weight loss Eyes: Denies blurriness of vision Ears, nose, mouth, throat, and face: Denies mucositis or sore throat Respiratory: Denies cough, dyspnea or wheezes Cardiovascular: Denies palpitation, chest discomfort or lower extremity swelling Gastrointestinal:  Denies nausea, heartburn or change in bowel habits Lymphatics: Denies new lymphadenopathy or easy bruising Neurological:Denies numbness, tingling or new  weaknesses Behavioral/Psych: Mood is stable, no new changes  All other systems were reviewed with the patient and are negative.  I have reviewed the past medical history, past surgical history, social history and family history with the patient and they are unchanged from previous note.  ALLERGIES:  is allergic to morphine and related and prochlorperazine.  MEDICATIONS:  Current Outpatient Prescriptions  Medication Sig Dispense Refill  . aspirin 81 MG tablet Take 81 mg by mouth daily.    Marland Kitchen atenolol (TENORMIN) 25 MG tablet Take 0.5 tablets (12.5 mg total) by mouth daily. 45 tablet 3  . cyclobenzaprine (FLEXERIL) 10 MG tablet     . glucose blood (FREESTYLE LITE) test strip Test once daily dx 250.00 100 each 3  . insulin lispro protamine-lispro (HUMALOG 75/25 MIX) (75-25) 100 UNIT/ML SUSP injection Inject 45 Units into the skin at bedtime. 4 vial 3  . INSULIN SYRINGE .5CC/29G (B-D INSULIN SYRINGE) 29G X 1/2" 0.5 ML MISC Use once daily, dx 250.00 100 each 3  . metFORMIN (GLUCOPHAGE) 500 MG tablet Take 1 tablet (500 mg total) by mouth daily with breakfast. 90 tablet 3  . simvastatin (ZOCOR) 20 MG tablet Take 1 tablet (20 mg total) by mouth at bedtime. 90 tablet 3   No current facility-administered medications for this visit.    Facility-Administered Medications Ordered in Other Visits  Medication Dose Route Frequency Provider Last Rate Last Dose  . heparin lock flush 100 unit/mL  500 Units Intravenous Once Brunetta Genera, MD      .  sodium chloride 0.9 % injection 10 mL  10 mL Intravenous PRN Johney Maine, MD        PHYSICAL EXAMINATION: ECOG PERFORMANCE STATUS: 1 - Symptomatic but completely ambulatory  Vitals:   12/02/15 1402  BP: 140/68  Pulse: 79  Resp: 18  Temp: 99.2 F (37.3 C)   Filed Weights   12/02/15 1402  Weight: 210 lb 9.6 oz (95.5 kg)    GENERAL:alert, no distress and comfortable SKIN: Noted skin discoloration around his right forehead, with some  crusted lesions and appears to be healing EYES: normal, Conjunctiva are pink and non-injected, sclera clear OROPHARYNX:no exudate, no erythema and lips, buccal mucosa, and tongue normal  NECK: supple, thyroid normal size, non-tender, without nodularity LYMPH:  no palpable lymphadenopathy in the cervical, axillary or inguinal LUNGS: clear to auscultation and percussion with normal breathing effort HEART: regular rate & rhythm and no murmurs and no lower extremity edema ABDOMEN:abdomen soft, non-tender and normal bowel sounds. Central obesity Musculoskeletal:no cyanosis of digits and no clubbing  NEURO: alert & oriented x 3 with fluent speech, no focal motor/sensory deficits  LABORATORY DATA:  I have reviewed the data as listed    Component Value Date/Time   NA 138 12/02/2015 1335   K 4.1 12/02/2015 1335   CL 104 10/21/2015 0759   CO2 24 12/02/2015 1335   GLUCOSE 252 (H) 12/02/2015 1335   BUN 14.2 12/02/2015 1335   CREATININE 1.0 12/02/2015 1335   CALCIUM 9.0 12/02/2015 1335   PROT 6.6 12/02/2015 1335   ALBUMIN 3.5 12/02/2015 1335   AST 21 12/02/2015 1335   ALT 28 12/02/2015 1335   ALKPHOS 79 12/02/2015 1335   BILITOT 0.61 12/02/2015 1335   GFRNONAA 71.09 01/24/2010 0753   GFRAA 78 01/20/2008 0755    No results found for: SPEP, UPEP  Lab Results  Component Value Date   WBC 5.0 12/02/2015   NEUTROABS 3.4 12/02/2015   HGB 15.4 12/02/2015   HCT 45.2 12/02/2015   MCV 97.5 12/02/2015   PLT 141 12/02/2015      Chemistry      Component Value Date/Time   NA 138 12/02/2015 1335   K 4.1 12/02/2015 1335   CL 104 10/21/2015 0759   CO2 24 12/02/2015 1335   BUN 14.2 12/02/2015 1335   CREATININE 1.0 12/02/2015 1335      Component Value Date/Time   CALCIUM 9.0 12/02/2015 1335   ALKPHOS 79 12/02/2015 1335   AST 21 12/02/2015 1335   ALT 28 12/02/2015 1335   BILITOT 0.61 12/02/2015 1335       ASSESSMENT & PLAN:  AML (acute myeloid leukemia) in remission He is not  symptomatic. His blood work confirmed the patient remained in remission Currently, the patient has been in remission for 2 years He has appointment to see his primary care doctor in 2 months I plan to see him back in 6 months   Zoster He has recent zoster outbreak affecting the right eyebrow area. Overall, he is feeling good and it is healing well. He denies pain. I do not recommend him to undergo prophylactic vaccination against herpes zoster due to his prior history of AML. He has recently received influenza vaccination  Obesity, Class II, BMI 35-39.9, with comorbidity The patient is morbidly obese with risk factors for heart disease. He is a diabetic. We discussed dietary modification and graduated exercise.   No orders of the defined types were placed in this encounter.  All questions were answered. The patient  knows to call the clinic with any problems, questions or concerns. No barriers to learning was detected. I spent 15 minutes counseling the patient face to face. The total time spent in the appointment was 20 minutes and more than 50% was on counseling and review of test results     Heath Lark, MD 12/02/2015 2:46 PM

## 2015-12-03 ENCOUNTER — Telehealth: Payer: Self-pay | Admitting: Hematology and Oncology

## 2015-12-03 NOTE — Telephone Encounter (Signed)
Returned call to patient regarding next scheduled appointments. 06/01/2016 appointments scheduled per 10/19 LOS.

## 2015-12-22 ENCOUNTER — Ambulatory Visit (HOSPITAL_COMMUNITY): Payer: Managed Care, Other (non HMO) | Attending: Cardiology

## 2015-12-22 ENCOUNTER — Other Ambulatory Visit: Payer: Self-pay

## 2015-12-22 DIAGNOSIS — I340 Nonrheumatic mitral (valve) insufficiency: Secondary | ICD-10-CM | POA: Diagnosis not present

## 2015-12-22 DIAGNOSIS — E785 Hyperlipidemia, unspecified: Secondary | ICD-10-CM | POA: Diagnosis not present

## 2015-12-22 DIAGNOSIS — E119 Type 2 diabetes mellitus without complications: Secondary | ICD-10-CM | POA: Insufficient documentation

## 2015-12-22 DIAGNOSIS — I10 Essential (primary) hypertension: Secondary | ICD-10-CM | POA: Diagnosis present

## 2015-12-22 DIAGNOSIS — I080 Rheumatic disorders of both mitral and aortic valves: Secondary | ICD-10-CM

## 2015-12-22 HISTORY — PX: TRANSTHORACIC ECHOCARDIOGRAM: SHX275

## 2015-12-30 ENCOUNTER — Ambulatory Visit (HOSPITAL_COMMUNITY)
Admission: RE | Admit: 2015-12-30 | Discharge: 2015-12-30 | Disposition: A | Discharge status: Home / Self Care | Payer: Managed Care, Other (non HMO) | Source: Ambulatory Visit | Attending: Cardiology | Admitting: Cardiology

## 2015-12-30 ENCOUNTER — Telehealth: Payer: Self-pay | Admitting: *Deleted

## 2015-12-30 DIAGNOSIS — I340 Nonrheumatic mitral (valve) insufficiency: Secondary | ICD-10-CM

## 2015-12-30 DIAGNOSIS — D689 Coagulation defect, unspecified: Secondary | ICD-10-CM

## 2015-12-30 DIAGNOSIS — Z01818 Encounter for other preprocedural examination: Secondary | ICD-10-CM

## 2015-12-30 NOTE — Telephone Encounter (Signed)
-----   Message from Minus Breeding, MD sent at 12/29/2015  5:07 PM EST ----- Discussed the results with the patient.  He needs a TEE.  Please call him to arrange this to look at his mitral regurgitation.  Send results to TODD,JEFFREY Zenia Resides, MD

## 2015-12-30 NOTE — Telephone Encounter (Signed)
TEE ordered send to scheduler to be schedule Lab work also ordered for pt to get done

## 2015-12-31 ENCOUNTER — Telehealth: Payer: Self-pay | Admitting: Cardiology

## 2015-12-31 ENCOUNTER — Encounter: Payer: Self-pay | Admitting: Cardiology

## 2015-12-31 NOTE — Telephone Encounter (Signed)
Melissa at [facility] calling regarding TEE for this patient is scheduled as inpatient and per order it should be in Endo

## 2015-12-31 NOTE — Telephone Encounter (Signed)
TEE schedule

## 2016-01-03 LAB — CBC
HEMATOCRIT: 47.3 % (ref 38.5–50.0)
HEMOGLOBIN: 16.1 g/dL (ref 13.2–17.1)
MCH: 33.6 pg — AB (ref 27.0–33.0)
MCHC: 34 g/dL (ref 32.0–36.0)
MCV: 98.7 fL (ref 80.0–100.0)
MPV: 9.3 fL (ref 7.5–12.5)
Platelets: 129 10*3/uL — ABNORMAL LOW (ref 140–400)
RBC: 4.79 MIL/uL (ref 4.20–5.80)
RDW: 14.2 % (ref 11.0–15.0)
WBC: 4.5 10*3/uL (ref 3.8–10.8)

## 2016-01-04 LAB — BASIC METABOLIC PANEL
BUN: 13 mg/dL (ref 7–25)
CHLORIDE: 105 mmol/L (ref 98–110)
CO2: 29 mmol/L (ref 20–31)
Calcium: 9.2 mg/dL (ref 8.6–10.3)
Creat: 0.9 mg/dL (ref 0.70–1.18)
Glucose, Bld: 141 mg/dL — ABNORMAL HIGH (ref 65–99)
POTASSIUM: 4.7 mmol/L (ref 3.5–5.3)
Sodium: 142 mmol/L (ref 135–146)

## 2016-01-04 LAB — PROTIME-INR
INR: 1
Prothrombin Time: 10.6 s (ref 9.0–11.5)

## 2016-01-04 LAB — TSH: TSH: 2.51 mIU/L (ref 0.40–4.50)

## 2016-01-04 LAB — APTT: APTT: 27 s (ref 22–34)

## 2016-01-11 ENCOUNTER — Encounter (HOSPITAL_COMMUNITY): Payer: Self-pay | Admitting: *Deleted

## 2016-01-11 ENCOUNTER — Ambulatory Visit (HOSPITAL_COMMUNITY)
Admission: RE | Admit: 2016-01-11 | Discharge: 2016-01-11 | Disposition: A | Discharge status: Home / Self Care | Payer: Managed Care, Other (non HMO) | Source: Ambulatory Visit | Attending: Cardiology | Admitting: Cardiology

## 2016-01-11 ENCOUNTER — Encounter (HOSPITAL_COMMUNITY)
Admission: RE | Disposition: A | Discharge status: Home / Self Care | Payer: Self-pay | Source: Ambulatory Visit | Attending: Cardiology

## 2016-01-11 ENCOUNTER — Ambulatory Visit (HOSPITAL_BASED_OUTPATIENT_CLINIC_OR_DEPARTMENT_OTHER)
Admission: RE | Admit: 2016-01-11 | Discharge: 2016-01-11 | Disposition: A | Discharge status: Home / Self Care | Payer: Managed Care, Other (non HMO) | Source: Ambulatory Visit | Attending: Internal Medicine | Admitting: Internal Medicine

## 2016-01-11 DIAGNOSIS — E785 Hyperlipidemia, unspecified: Secondary | ICD-10-CM | POA: Insufficient documentation

## 2016-01-11 DIAGNOSIS — Z794 Long term (current) use of insulin: Secondary | ICD-10-CM | POA: Insufficient documentation

## 2016-01-11 DIAGNOSIS — Q211 Atrial septal defect: Secondary | ICD-10-CM | POA: Insufficient documentation

## 2016-01-11 DIAGNOSIS — Z79899 Other long term (current) drug therapy: Secondary | ICD-10-CM | POA: Diagnosis not present

## 2016-01-11 DIAGNOSIS — I340 Nonrheumatic mitral (valve) insufficiency: Secondary | ICD-10-CM | POA: Insufficient documentation

## 2016-01-11 DIAGNOSIS — Z7982 Long term (current) use of aspirin: Secondary | ICD-10-CM | POA: Insufficient documentation

## 2016-01-11 DIAGNOSIS — C9201 Acute myeloblastic leukemia, in remission: Secondary | ICD-10-CM | POA: Insufficient documentation

## 2016-01-11 DIAGNOSIS — I10 Essential (primary) hypertension: Secondary | ICD-10-CM | POA: Diagnosis not present

## 2016-01-11 DIAGNOSIS — I080 Rheumatic disorders of both mitral and aortic valves: Secondary | ICD-10-CM | POA: Diagnosis present

## 2016-01-11 DIAGNOSIS — E119 Type 2 diabetes mellitus without complications: Secondary | ICD-10-CM | POA: Diagnosis not present

## 2016-01-11 HISTORY — PX: TEE WITHOUT CARDIOVERSION: SHX5443

## 2016-01-11 LAB — GLUCOSE, CAPILLARY: Glucose-Capillary: 138 mg/dL — ABNORMAL HIGH (ref 65–99)

## 2016-01-11 SURGERY — ECHOCARDIOGRAM, TRANSESOPHAGEAL
Anesthesia: Moderate Sedation

## 2016-01-11 MED ORDER — FENTANYL CITRATE (PF) 100 MCG/2ML IJ SOLN
INTRAMUSCULAR | Status: AC
  Filled 2016-01-11: qty 2

## 2016-01-11 MED ORDER — MIDAZOLAM HCL 5 MG/ML IJ SOLN
INTRAMUSCULAR | Status: AC
  Filled 2016-01-11: qty 2

## 2016-01-11 MED ORDER — SODIUM CHLORIDE 0.9 % IV SOLN: INTRAVENOUS | Status: DC

## 2016-01-11 MED ORDER — MIDAZOLAM HCL 10 MG/2ML IJ SOLN
INTRAMUSCULAR | Status: DC | PRN
  Administered 2016-01-11 (×2): 2 mg via INTRAVENOUS

## 2016-01-11 MED ORDER — BUTAMBEN-TETRACAINE-BENZOCAINE 2-2-14 % EX AERO
INHALATION_SPRAY | CUTANEOUS | Status: DC | PRN
  Administered 2016-01-11: 2 via TOPICAL

## 2016-01-11 MED ORDER — LIDOCAINE VISCOUS 2 % MT SOLN
OROMUCOSAL | Status: AC
  Filled 2016-01-11: qty 15

## 2016-01-11 MED ORDER — FENTANYL CITRATE (PF) 100 MCG/2ML IJ SOLN
INTRAMUSCULAR | Status: DC | PRN
  Administered 2016-01-11 (×2): 25 ug via INTRAVENOUS

## 2016-01-11 MED ORDER — LIDOCAINE VISCOUS 2 % MT SOLN
OROMUCOSAL | Status: DC | PRN
  Administered 2016-01-11: 1 via OROMUCOSAL

## 2016-01-11 MED ORDER — DIPHENHYDRAMINE HCL 50 MG/ML IJ SOLN
INTRAMUSCULAR | Status: AC
  Filled 2016-01-11: qty 1

## 2016-01-11 NOTE — CV Procedure (Signed)
TRANSESOPHAGEAL ECHOCARDIOGRAM (TEE) NOTE  INDICATIONS: Mitral regurgitation  PROCEDURE:   Informed consent was obtained prior to the procedure. The risks, benefits and alternatives for the procedure were discussed and the patient comprehended these risks.  Risks include, but are not limited to, cough, sore throat, vomiting, nausea, somnolence, esophageal and stomach trauma or perforation, bleeding, low blood pressure, aspiration, pneumonia, infection, trauma to the teeth and death.    After a procedural time-out, the patient was given 4 mg versed and 50 mcg fentanyl for moderate sedation.  The patient's heart rate, blood pressure, and oxygen saturation are monitored continuously during the procedure.The oropharynx was anesthetized 10 cc of topical 1% viscous lidocaine and 2 cetacaine sprays.  The transesophageal probe was inserted in the esophagus and stomach without difficulty and multiple views were obtained.  The patient was kept under observation until the patient left the procedure room.  The period of conscious sedation is 28 minutes, of which I was present face-to-face 100% of this time. The patient left the procedure room in stable condition.   Agitated microbubble saline contrast was administered.  COMPLICATIONS:    There were no immediate complications.  Findings:  1. LEFT VENTRICLE: The left ventricular wall thickness is mildly increased.  The left ventricular cavity is normal in size. Wall motion is normal.  LVEF is 55-60%.  2. RIGHT VENTRICLE:  The right ventricle is normal in structure and function without any thrombus or masses.    3. LEFT ATRIUM:  The left atrium is dilated in size without any thrombus or masses.  There is not spontaneous echo contrast ("smoke") in the left atrium consistent with a low flow state.  4. LEFT ATRIAL APPENDAGE:  The left atrial appendage is free of any thrombus or masses. The appendage has single lobes. Pulse doppler indicates moderate  flow in the appendage.  5. ATRIAL SEPTUM:  The atrial septum appears intact and is free of thrombus and/or masses.  There is no evidence for interatrial shunting by color doppler and saline microbubble.  6. RIGHT ATRIUM:  The right atrium is normal in size and function without any thrombus or masses.  7. MITRAL VALVE:  The mitral valve demonstrates mid to late systolic prolapse of the P2 segment of the posterior leaflet with a flail cord noted to prolapse past the valve plane in to the left atrium. There is Severe regurgitation. Reversal of flow in the LUPV is noted. RVol on PISA is 80 ml with ERO of 0.5 cm2. There were no vegetations or stenosis.  8. AORTIC VALVE:  The aortic valve is trileaflet, normal in structure and function with no regurgitation.  There were no vegetations or stenosis  9. TRICUSPID VALVE:  The tricuspid valve is normal in structure and function with trivial regurgitation.  There were no vegetations or stenosis  10.  PULMONIC VALVE:  The pulmonic valve is normal in structure and function with trivial regurgitation.  There were no vegetations or stenosis.   11. AORTIC ARCH, ASCENDING AND DESCENDING AORTA:  There was no Ron Parker et. Al, 1992) atherosclerosis of the ascending aorta, aortic arch, or proximal descending aorta.  12. PULMONARY VEINS: Anomalous pulmonary venous return was not noted. Systolic flow reversal was noted in the LUPV.  13. PERICARDIUM: The pericardium appeared normal and non-thickened.  There is no pericardial effusion.  IMPRESSION:   1. Severe mitral regurgitation associated with flail P2 segment of the mitral valve. 2. No LAA thrombus 3. Small PFO by saline microbubble contrast after  valsalva 4. LVEF 55-60%  RECOMMENDATIONS:    1. Despite the findings, the patient reports no worsening dyspnea or fatigue over the past year. He says he has had mitral regurgitation for 15 years, however, the findings of a flail leaflet are new and his MR is now severe  (it had previously been moderate).  Time Spent Directly with the Patient:  60 minutes   Pixie Casino, MD, [facility] Attending Cardiologist [facility]  01/11/2016, 10:44 AM

## 2016-01-11 NOTE — Discharge Instructions (Signed)

## 2016-01-11 NOTE — H&P (Signed)
ADMISSION HISTORY & PHYSICAL   Chief Complaint:  Shortness of breath  Cardiologist: Dr. Percival Spanish  Primary Care Physician: Joycelyn Man, MD  HPI:  This is a 70 y.o. male patient of Dr. Percival Spanish with a history of mitral regurgitation which was moderate by echocardiogram done at [facility] in December 2015. David Carr was last seen by Dr. Archie Patten in December 2016. EF was stable. David Carr was scheduled for repeat echo in November 2017. This demonstrated moderate prolapse involving the posterior leaflet with flail motion of the posterior leaflet and there was concern about possibly severe MR. A TEE was recommended. 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 David Carr does report shortness of breath with climbing stairs and significant exercise.  PMHx:  Past Medical History:  Diagnosis Date  . Allergy   . AML (acute myeloid leukemia) in remission ([place]) 11/14/2013  . Asthma   . Diabetes mellitus   . Hyperlipidemia   . Hypertension   . Mitral insufficiency   . Nasal drainage 02/12/2014  . Other fatigue 01/19/2014  . Pancytopenia, acquired ([place]) 05/28/2015  . Thrombocytopenia, unspecified 09/04/2013  . Urinary tract disorder    outlet obstruction following surgery    Past Surgical History:  Procedure Laterality Date  . ELBOW SURGERY    . INGUINAL HERNIA REPAIR     left  . ROTATOR CUFF REPAIR      FAMHx:  Family History  Problem Relation Age of Onset  . Asthma Sister   . Cancer Daughter     carcinoid tumor    SOCHx:   reports that David Carr has quit smoking. David Carr has never used smokeless tobacco. David Carr reports that David Carr does not drink alcohol or use drugs.  ALLERGIES:  Allergies  Allergen Reactions  . Morphine And Related     Shuts bladder down.  Marland Kitchen Prochlorperazine Other (See Comments)    Heart arrythmia    ROS: Pertinent items noted in HPI and remainder of comprehensive ROS otherwise negative.  HOME MEDS: Current Facility-Administered  Medications on File Prior to Encounter  Medication Dose Route Frequency Provider Last Rate Last Dose  . heparin lock flush 100 unit/mL  500 Units Intravenous Once Brunetta Genera, MD      . sodium chloride 0.9 % injection 10 mL  10 mL Intravenous PRN Gautam Juleen China, MD       Current Outpatient Prescriptions on File Prior to Encounter  Medication Sig Dispense Refill  . aspirin 81 MG tablet Take 81 mg by mouth daily.    Marland Kitchen atenolol (TENORMIN) 25 MG tablet Take 0.5 tablets (12.5 mg total) by mouth daily. 45 tablet 3  . glucose blood (FREESTYLE LITE) test strip Test once daily dx 250.00 100 each 3  . insulin lispro protamine-lispro (HUMALOG 75/25 MIX) (75-25) 100 UNIT/ML SUSP injection Inject 45 Units into the skin at bedtime. 4 vial 3  . INSULIN SYRINGE .5CC/29G (B-D INSULIN SYRINGE) 29G X 1/2" 0.5 ML MISC Use once daily, dx 250.00 100 each 3  . metFORMIN (GLUCOPHAGE) 500 MG tablet Take 1 tablet (500 mg total) by mouth daily with breakfast. 90 tablet 3  . simvastatin (ZOCOR) 20 MG tablet Take 1 tablet (20 mg total) by mouth at bedtime. 90 tablet 3  . cyclobenzaprine (FLEXERIL) 10 MG tablet       LABS/IMAGING: Results for orders placed or performed during the hospital encounter of 01/11/16 (from the past 48 hour(s))  Glucose, capillary     Status: Abnormal  Collection Time: 01/11/16  9:12 AM  Result Value Ref Range   Glucose-Capillary 138 (H) 65 - 99 mg/dL   No results found.  VITALS: Vitals:   01/11/16 0900  BP: (!) 152/78  Pulse: 70  Resp: 16  Temp: 97.9 F (36.6 C)    EXAM: General appearance: alert and no distress Lungs: clear to auscultation bilaterally Heart: regular rate and rhythm, S1, S2 normal and systolic murmur: late systolic 3/6, blowing at apex Extremities: extremities normal, atraumatic, no cyanosis or edema Neurologic: Grossly normal  IMPRESSION: Principal Problem:   MITRAL REGURGITATION   PLAN: 1. David Carr presents for evaluation of moderate  and possibly severe mitral regurgitation with a flail posterior mitral leaflet. I discussed the benefits, risks and alternatives of transesophageal echocardiography today with him and David Carr is agreeable to the procedure.  Pixie Casino, MD, [facility] Attending Cardiologist [place] C Shanira Tine 01/11/2016, 9:40 AM

## 2016-01-12 ENCOUNTER — Encounter (HOSPITAL_COMMUNITY): Payer: Self-pay | Admitting: Internal Medicine

## 2016-01-18 ENCOUNTER — Other Ambulatory Visit (INDEPENDENT_AMBULATORY_CARE_PROVIDER_SITE_OTHER): Payer: Managed Care, Other (non HMO)

## 2016-01-18 DIAGNOSIS — D649 Anemia, unspecified: Secondary | ICD-10-CM | POA: Diagnosis not present

## 2016-01-18 DIAGNOSIS — E109 Type 1 diabetes mellitus without complications: Secondary | ICD-10-CM | POA: Diagnosis not present

## 2016-01-18 DIAGNOSIS — E139 Other specified diabetes mellitus without complications: Secondary | ICD-10-CM

## 2016-01-18 LAB — CBC WITH DIFFERENTIAL/PLATELET
BASOS ABS: 0 10*3/uL (ref 0.0–0.1)
Basophils Relative: 0.4 % (ref 0.0–3.0)
EOS PCT: 0.4 % (ref 0.0–5.0)
Eosinophils Absolute: 0 10*3/uL (ref 0.0–0.7)
HEMATOCRIT: 46.4 % (ref 39.0–52.0)
Hemoglobin: 16 g/dL (ref 13.0–17.0)
LYMPHS PCT: 19 % (ref 12.0–46.0)
Lymphs Abs: 1 10*3/uL (ref 0.7–4.0)
MCHC: 34.5 g/dL (ref 30.0–36.0)
MCV: 97.2 fl (ref 78.0–100.0)
MONOS PCT: 6.5 % (ref 3.0–12.0)
Monocytes Absolute: 0.3 10*3/uL (ref 0.1–1.0)
Neutro Abs: 3.8 10*3/uL (ref 1.4–7.7)
Neutrophils Relative %: 73.7 % (ref 43.0–77.0)
Platelets: 160 10*3/uL (ref 150.0–400.0)
RBC: 4.77 Mil/uL (ref 4.22–5.81)
RDW: 14.5 % (ref 11.5–15.5)
WBC: 5.2 10*3/uL (ref 4.0–10.5)

## 2016-01-18 LAB — HEMOGLOBIN A1C: HEMOGLOBIN A1C: 7.5 % — AB (ref 4.6–6.5)

## 2016-01-24 ENCOUNTER — Telehealth: Payer: Self-pay | Admitting: Cardiology

## 2016-01-24 NOTE — Telephone Encounter (Signed)
Pt called and said he was waiting to hear back from you,concerning his TEE results. He said you had his phone number.He did not want me to put it in this encounter.

## 2016-01-25 ENCOUNTER — Encounter: Payer: Self-pay | Admitting: Family Medicine

## 2016-01-25 ENCOUNTER — Ambulatory Visit (INDEPENDENT_AMBULATORY_CARE_PROVIDER_SITE_OTHER): Payer: Managed Care, Other (non HMO) | Admitting: Family Medicine

## 2016-01-25 VITALS — BP 118/74 | HR 78 | Temp 98.2°F | Wt 210.8 lb

## 2016-01-25 DIAGNOSIS — E109 Type 1 diabetes mellitus without complications: Secondary | ICD-10-CM

## 2016-01-25 NOTE — Progress Notes (Signed)
Pre visit review using our clinic review tool, if applicable. No additional management support is needed unless otherwise documented below in the visit note. 

## 2016-01-25 NOTE — Progress Notes (Signed)
David Carr is a 70 year old married male nonsmoker who comes in today for follow-up of diabetes  We saw him 3 months ago his A1c was 7.8%. He takes metformin 500 mg supposedly in the morning however he tells me takes it in the evening because he does not eat breakfast nor lunch. I explained to him the importance of eating 3 good meals daily however he says he is currently going to eat 1 meal a day. Every 2 weeks he'll have a hypoglycemic episode 2 or 3 in the morning. He checks his blood sugars drop to 50.  We would normally increase his insulin or his metformin to get his A1c to go but in this clinical situation it's gone exacerbate his hypoglycemic episodes. I discussed with him numerous possibilities. He's willing to take a half a metformin the morning and half prior to his evening meal keep his insulin the same and get a consult from [place]  [facility] .............BP 118/74 (BP Location: Left Arm, Patient Position: Sitting, Cuff Size: Normal)   Pulse 78   Temp 98.2 F (36.8 C) (Oral)   Wt 210 lb 12.8 oz (95.6 kg)   SpO2 97%   BMI 31.59 kg/m  Well-developed well-nourished male no acute distress vital signs stable he is afebrile  Impression diabetes type 1 not at goal......... split the metformin....... one half in the morning.....Marland Kitchen one half priority male..... Continue the insulin...... and a consult with Dr. Lorie Apley ASAP.Marland Kitchen

## 2016-01-25 NOTE — Patient Instructions (Signed)
Split the metformin dose and keep your insulin dosage the same.  We'll set you up a consult with Dr. Lorie Apley ASAP

## 2016-02-09 ENCOUNTER — Encounter: Payer: Self-pay | Admitting: Internal Medicine

## 2016-03-16 ENCOUNTER — Ambulatory Visit (INDEPENDENT_AMBULATORY_CARE_PROVIDER_SITE_OTHER): Payer: Managed Care, Other (non HMO) | Admitting: Internal Medicine

## 2016-03-16 ENCOUNTER — Other Ambulatory Visit: Payer: Self-pay

## 2016-03-16 ENCOUNTER — Telehealth: Payer: Self-pay | Admitting: Internal Medicine

## 2016-03-16 ENCOUNTER — Encounter: Payer: Self-pay | Admitting: Internal Medicine

## 2016-03-16 VITALS — BP 128/80 | HR 76 | Ht 68.0 in | Wt 211.0 lb

## 2016-03-16 DIAGNOSIS — E11649 Type 2 diabetes mellitus with hypoglycemia without coma: Secondary | ICD-10-CM | POA: Diagnosis not present

## 2016-03-16 DIAGNOSIS — Z794 Long term (current) use of insulin: Secondary | ICD-10-CM | POA: Diagnosis not present

## 2016-03-16 MED ORDER — BASAGLAR KWIKPEN 100 UNIT/ML ~~LOC~~ SOPN
PEN_INJECTOR | SUBCUTANEOUS | 0 refills | Status: DC
Start: 2016-03-16 — End: 2016-05-29

## 2016-03-16 MED ORDER — METFORMIN HCL ER 500 MG PO TB24: 1000.0000 mg | ORAL_TABLET | Freq: Every day | ORAL | 3 refills | Status: DC

## 2016-03-16 MED ORDER — INSULIN PEN NEEDLE 32G X 4 MM MISC: 3 refills | Status: DC

## 2016-03-16 MED ORDER — INSULIN GLARGINE 100 UNIT/ML SOLOSTAR PEN: 30.0000 [IU] | PEN_INJECTOR | Freq: Every day | SUBCUTANEOUS | 11 refills | Status: DC

## 2016-03-16 NOTE — Telephone Encounter (Signed)
Insulin submitted.

## 2016-03-16 NOTE — Patient Instructions (Signed)
Please switch to Metformin ER and take 500 mg for the first 4 days, then increase to 1000 mg with dinner.  Please stop Humalog 75/25.  Start Lantus 30 units at bedtime.  Please return in 1.5 months with your sugar log.   Please send me the sugar log in 2 weeks.  PATIENT INSTRUCTIONS FOR TYPE 2 DIABETES:  **Please join MyChart!** - see attached instructions about how to join if you have not done so already.  DIET AND EXERCISE Diet and exercise is an important part of diabetic treatment.  We recommended aerobic exercise in the form of brisk walking (working between 40-60% of maximal aerobic capacity, similar to brisk walking) for 150 minutes per week (such as 30 minutes five days per week) along with 3 times per week performing 'resistance' training (using various gauge rubber tubes with handles) 5-10 exercises involving the major muscle groups (upper body, lower body and core) performing 10-15 repetitions (or near fatigue) each exercise. Start at half the above goal but build slowly to reach the above goals. If limited by weight, joint pain, or disability, we recommend daily walking in a swimming pool with water up to waist to reduce pressure from joints while allow for adequate exercise.    BLOOD GLUCOSES Monitoring your blood glucoses is important for continued management of your diabetes. Please check your blood glucoses 2-4 times a day: fasting, before meals and at bedtime (you can rotate these measurements - e.g. one day check before the 3 meals, the next day check before 2 of the meals and before bedtime, etc.).   HYPOGLYCEMIA (low blood sugar) Hypoglycemia is usually a reaction to not eating, exercising, or taking too much insulin/ other diabetes drugs.  Symptoms include tremors, sweating, hunger, confusion, headache, etc. Treat IMMEDIATELY with 15 grams of Carbs: . 4 glucose tablets .  cup regular juice/soda . 2 tablespoons raisins . 4 teaspoons sugar . 1 tablespoon  honey Recheck blood glucose in 15 mins and repeat above if still symptomatic/blood glucose <100.  RECOMMENDATIONS TO REDUCE YOUR RISK OF DIABETIC COMPLICATIONS: * Take your prescribed MEDICATION(S) * Follow a DIABETIC diet: Complex carbs, fiber rich foods, (monounsaturated and polyunsaturated) fats * AVOID saturated/trans fats, high fat foods, >2,300 mg salt per day. * EXERCISE at least 5 times a week for 30 minutes or preferably daily.  * DO NOT SMOKE OR DRINK more than 1 drink a day. * Check your FEET every day. Do not wear tightfitting shoes. Contact us if you develop an ulcer * See your EYE doctor once a year or more if needed * Get a FLU shot once a year * Get a PNEUMONIA vaccine once before and once after age 3 years  GOALS:  * Your Hemoglobin A1c of <7%  * fasting sugars need to be <130 * after meals sugars need to be <180 (2h after you start eating) * Your Systolic BP should be [id number] or lower  * Your Diastolic BP should be 80 or lower  * Your HDL (Good Cholesterol) should be 40 or higher  * Your LDL (Bad Cholesterol) should be 100 or lower. * Your Triglycerides should be 150 or lower  * Your Urine microalbumin (kidney function) should be <30 * Your Body Mass Index should be 25 or lower    Please consider the following ways to cut down carbs and fat and increase fiber and micronutrients in your diet: - substitute whole grain for white bread or pasta - substitute brown rice for white rice -  substitute 90-calorie flat bread pieces for slices of bread when possible - substitute sweet potatoes or yams for white potatoes - substitute humus for margarine - substitute tofu for cheese when possible - substitute almond or rice milk for regular milk (would not drink soy milk daily due to concern for soy estrogen influence on breast cancer risk) - substitute dark chocolate for other sweets when possible - substitute water - can add lemon or orange slices for taste - for diet sodas  (artificial sweeteners will trick your body that you can eat sweets without getting calories and will lead you to overeating and weight gain in the long run) - do not skip breakfast or other meals (this will slow down the metabolism and will result in more weight gain over time)  - can try smoothies made from fruit and almond/rice milk in am instead of regular breakfast - can also try old-fashioned (not instant) oatmeal made with almond/rice milk in am - order the dressing on the side when eating salad at a restaurant (pour less than half of the dressing on the salad) - eat as little meat as possible - can try juicing, but should not forget that juicing will get rid of the fiber, so would alternate with eating raw veg./fruits or drinking smoothies - use as little oil as possible, even when using olive oil - can dress a salad with a mix of balsamic vinegar and lemon juice, for e.g. - use agave nectar, stevia sugar, or regular sugar rather than artificial sweateners - steam or broil/roast veggies  - snack on veggies/fruit/nuts (unsalted, preferably) when possible, rather than processed foods - reduce or eliminate aspartame in diet (it is in diet sodas, chewing gum, etc) Read the labels!  Try to read Dr. Janene Harvey book: "Program for Reversing Diabetes" for other ideas for healthy eating.

## 2016-03-16 NOTE — Telephone Encounter (Signed)
OK, same dose 

## 2016-03-16 NOTE — Progress Notes (Signed)
Patient ID: [name], male   DOB: 04/01/1945, 71 y.o.   MRN: [id number]   HPI: David Carr. is a 71 y.o.-year-old male, referred by his PCP, Dr. Sherren Mocha, for management of DM2, dx in end of the 1990s, insulin-dependent since ~2014, uncontrolled, without long term complications.  Last hemoglobin A1c was: Lab Results  Component Value Date   HGBA1C 7.5 (H) 01/18/2016   HGBA1C 7.8 (H) 10/21/2015   HGBA1C 8.0 (H) 04/15/2015   Pt is on a regimen of: - Humalog 75/25 35 >> increased to 45 units at bedtime! - Metformin 500 mg 2x a day, with meals >> now 500 mg at dinnertime  Pt checks his sugars 3x a week and they are: - am: 90s-110 - 2h after b'fast: n/c - before lunch: n/c - 2h after lunch: n/c - before dinner: n/c - 2h after dinner: n/c - bedtime: n/c - nighttime: n/c + frequent lows. Lowest sugar was 50 - at 2-3 am - every 1-2 weeks >> goes to the kitchen for OJ ; he has hypoglycemia awareness - but ? Level.  Highest sugar was 240s.  Glucometer: Freestyle Lite  Pt's meals are: - Breakfast: coffee + nab or slice pound cake + pop tarts - Lunch: fast food or sandwich or skips - Dinner: meat + veggies  - Snacks: 1, before bedtime Quit sodas. He walks 2x day: 1 mi - with his black lab.  - no CKD, last BUN/creatinine:  Lab Results  Component Value Date   BUN 13 01/03/2016   BUN 14.2 12/02/2015   CREATININE 0.90 01/03/2016   CREATININE 1.0 12/02/2015   - last set of lipids: Lab Results  Component Value Date   CHOL 130 10/21/2015   HDL 44.70 10/21/2015   LDLCALC 71 10/21/2015   LDLDIRECT 74.4 06/19/2006   TRIG 74.0 10/21/2015   CHOLHDL 3 10/21/2015  On Zocor. - last eye exam was in 2013. No DR.  - no numbness and tingling in his feet.  Pt has no FH of DM.  He has AML in remission.  ROS: Constitutional: no weight gain/loss, no fatigue, no subjective hyperthermia/hypothermia Eyes: no blurry vision, no xerophthalmia ENT: no sore throat, no nodules  palpated in throat, no dysphagia/odynophagia, no hoarseness Cardiovascular: no CP/SOB/palpitations/leg swelling Respiratory: no cough/SOB Gastrointestinal: no N/V/D/C Musculoskeletal: no muscle/joint aches Skin: no rashes Neurological: no tremors/numbness/tingling/dizziness Psychiatric: no depression/anxiety  Past Medical History:  Diagnosis Date  . Allergy   . AML (acute myeloid leukemia) in remission ([place]) 11/14/2013  . Asthma   . Diabetes mellitus   . Hyperlipidemia   . Hypertension   . Mitral insufficiency   . Nasal drainage 02/12/2014  . Other fatigue 01/19/2014  . Pancytopenia, acquired ([place]) 05/28/2015  . Thrombocytopenia, unspecified 09/04/2013  . Urinary tract disorder    outlet obstruction following surgery   Past Surgical History:  Procedure Laterality Date  . ELBOW SURGERY    . INGUINAL HERNIA REPAIR     left  . ROTATOR CUFF REPAIR    . TEE WITHOUT CARDIOVERSION N/A 01/11/2016   Procedure: TRANSESOPHAGEAL ECHOCARDIOGRAM (TEE);  Surgeon: Pixie Casino, MD;  Location: [facility] ENDOSCOPY;  Service: Cardiovascular;  Laterality: N/A;   Social History   Social History  . Marital status: Married    Spouse name: N/A  . Number of children: 2   Occupational History  . accountant   Social History Main Topics  . Smoking status: Former Research scientist (life sciences)  . Smokeless tobacco: Never Used  .  Alcohol use No  . Drug use: No   Current Outpatient Prescriptions on File Prior to Visit  Medication Sig Dispense Refill  . aspirin 81 MG tablet Take 81 mg by mouth daily.    Marland Kitchen atenolol (TENORMIN) 25 MG tablet Take 0.5 tablets (12.5 mg total) by mouth daily. 45 tablet 3  . cyclobenzaprine (FLEXERIL) 10 MG tablet     . glucose blood (FREESTYLE LITE) test strip Test once daily dx 250.00 100 each 3  . INSULIN SYRINGE .5CC/29G (B-D INSULIN SYRINGE) 29G X 1/2" 0.5 ML MISC Use once daily, dx 250.00 100 each 3  . simvastatin (ZOCOR) 20 MG tablet Take 1 tablet (20 mg total) by mouth at bedtime. 90  tablet 3   Current Facility-Administered Medications on File Prior to Visit  Medication Dose Route Frequency Provider Last Rate Last Dose  . heparin lock flush 100 unit/mL  500 Units Intravenous Once Brunetta Genera, MD      . sodium chloride 0.9 % injection 10 mL  10 mL Intravenous PRN Brunetta Genera, MD       He is also on: - Metformin 500 mg daily - Humalog 75/25 45 units at bedtime  Allergies  Allergen Reactions  . Morphine And Related     Shuts bladder down.  Marland Kitchen Prochlorperazine Other (See Comments)    Heart arrythmia   Family History  Problem Relation Age of Onset  . Asthma Sister   . Cancer Daughter     carcinoid tumor   PE: BP 128/80 (BP Location: Left Arm, Patient Position: Sitting)   Pulse 76   Ht 5\' 8"  (1.727 m)   Wt 211 lb (95.7 kg)   SpO2 94%   BMI 32.08 kg/m  Wt Readings from Last 3 Encounters:  03/16/16 211 lb (95.7 kg)  01/25/16 210 lb 12.8 oz (95.6 kg)  01/11/16 210 lb (95.3 kg)   Constitutional: overweight, in NAD Eyes: PERRLA, EOMI, no exophthalmos ENT: moist mucous membranes, no thyromegaly, no cervical lymphadenopathy Cardiovascular: RRR, +1 SEM, no RG Respiratory: CTA B Gastrointestinal: abdomen soft, NT, ND, BS+ Musculoskeletal: no deformities, strength intact in all 4 Skin: moist, warm, no rashes Neurological: no tremor with outstretched hands, DTR normal in all 4  ASSESSMENT: 1. DM2, insulin-dependent, uncontrolled, without Long term complications, but with hypoglycemia  PLAN:  1. Patient with long-standing, uncontrolled diabetes, on oral antidiabetic regimen (low dose Metformin) and also premixed insulin taken at bedtime. Since he increased his premixed insulin dose few mo ago >> he has low CBGs at night. We did discuss about the proper way to treat low blood sugars and I advised him to get Glu tablets on the nightstand. However, we also discussed that the premixed insulin is not amenable to be taken at bedtime, but should be taken  before a meal. That being said, since I do not have blood sugars later in the day, it is difficult to know whether he he absolutely needs the rapid acting insulin before dinner. I therefore advise him to stop the 75/25 Humalog insulin and start only long-acting Lantus at bedtime. I also advised him to start checking sugars at different times of the day and bring the log when he comes back at next visit. In 2 weeks, he will send me his sugar log to see if we need to cover his dinner. - Since he gets GI symptoms from metformin if he takes a higher dose, will switch to metformin ER and try to have him take 1000  mg with dinner. - I suggested to:  Patient Instructions  Please switch to Metformin ER and take 500 mg for the first 4 days, then increase to 1000 mg with dinner.  Please stop Humalog 75/25.  Start Lantus 30 units at bedtime.  Please return in 1.5 months with your sugar log.   Please send me the sugar log in 2 weeks.  - Strongly advised him to start checking sugars at different times of the day - check 1-2 times a day, rotating checks - given sugar log and advised how to fill it and to bring it at next appt  - given foot care handout and explained the principles  - given instructions for hypoglycemia management "15-15 rule"  - advised for yearly eye exams  - Return to clinic in 1.5 mo with sugar log   Philemon Kingdom, MD PhD [facility] Endocrinology

## 2016-03-16 NOTE — Telephone Encounter (Signed)
lantus is expensive over $100 but the basaglar is free with insurance is this an acceptable substitute  [facility] [address] please

## 2016-03-17 ENCOUNTER — Telehealth: Payer: Self-pay

## 2016-03-17 ENCOUNTER — Telehealth: Payer: Self-pay | Admitting: Internal Medicine

## 2016-03-17 NOTE — Telephone Encounter (Signed)
Attempted to contact patient, both numbers rejected. Correct dosage of medication is 30 units of basaglar at bedtime.

## 2016-03-17 NOTE — Telephone Encounter (Signed)
Pt called back and is aware of the correct basaglar dosing

## 2016-03-17 NOTE — Telephone Encounter (Signed)
Called both numbers, phone number was rejected. The basaglar medication is 30 units at bedtime if patient calls back.

## 2016-03-17 NOTE — Telephone Encounter (Signed)
Pt called in to verify that 30 units of Basaglar is correct due to having two different prescriptions sent in yesterday.  Please advise.

## 2016-03-28 ENCOUNTER — Telehealth: Payer: Self-pay | Admitting: Family Medicine

## 2016-03-28 NOTE — Telephone Encounter (Signed)
° ° ° °  Pt call to say he need to be referred to [facility] outpatient   rehab again for muscle spasm

## 2016-03-29 ENCOUNTER — Telehealth: Payer: Self-pay | Admitting: Emergency Medicine

## 2016-03-29 NOTE — Telephone Encounter (Signed)
Left pt a voicemail to give the office a call back.

## 2016-03-29 NOTE — Telephone Encounter (Signed)
Left pt a voicemail regarding the referral he would like put in. We need more information as in where are the muscle spasms occurring?

## 2016-04-07 ENCOUNTER — Telehealth: Payer: Self-pay

## 2016-04-07 NOTE — Telephone Encounter (Signed)
Called and LVM advising patient to call back to receive medication changes from [place] from his logs that were submitted to [place]. She wasn't to move the basaglar in the AM, so today he should take it at lunch, and from tomorrow on to take it in the am. She wants him to send another log in 2 weeks if possible. Left call back number for patient to return phone call. Also patient wanted to know if the metformin and basaglar could cause constipation and Dr.Gherghe said it was unlikely.

## 2016-04-14 ENCOUNTER — Other Ambulatory Visit: Payer: Self-pay | Admitting: Urology

## 2016-04-17 ENCOUNTER — Encounter (HOSPITAL_BASED_OUTPATIENT_CLINIC_OR_DEPARTMENT_OTHER): Payer: Self-pay | Admitting: *Deleted

## 2016-04-17 ENCOUNTER — Other Ambulatory Visit: Payer: Self-pay

## 2016-04-17 ENCOUNTER — Telehealth: Payer: Self-pay | Admitting: Cardiology

## 2016-04-17 ENCOUNTER — Telehealth: Payer: Self-pay | Admitting: Internal Medicine

## 2016-04-17 MED ORDER — METFORMIN HCL ER 500 MG PO TB24: 1000.0000 mg | ORAL_TABLET | Freq: Every day | ORAL | 3 refills | Status: DC

## 2016-04-17 NOTE — Telephone Encounter (Signed)
I called him many times and left several messages.  I will try now to call him again.

## 2016-04-17 NOTE — Telephone Encounter (Signed)
Refill   metFORMIN (GLUCOPHAGE-XR) 500 MG 24 hr tablet 60 tablet   [id number] - [place], [place] [address] [phone] (Phone) [phone] (Fax)

## 2016-04-17 NOTE — Telephone Encounter (Signed)
I tried again today to call David Carr to discuss his TEE from late Nov.  He has severe MR with mildly reduced LV function (slightly less than 60%).  He has no symptoms.  This would be an indication for elective valve repair.  However, again I reached his voicemail.  In the past I left several messages and did not get a call back.  He has not given [place] an alternative number.  I will continue to try to reach him.

## 2016-04-17 NOTE — Progress Notes (Addendum)
NPO AFTER MN WITH EXCEPTION CLEAR LIQUIDS UNTIL 0830 (NO CREAM / MILK PROUDUCTS).  ARRIVE AT 1300.  NEEDS ISTAT 8.  CURRENT EKG IN CHART AND EPIC. WILL TAKE ATENOLOL AM DOS W/ SIPS OF WATER AND IF NEEDED TAKE PERCOCET.  ADDENDUM:  REVIEWED CHART W/ DR ROBERT FITZGERALD MDA,   STATED EVEN THOUGH PT IS ASYMPTOMATIC AND PT'S PRIMARY CARDIOLOGIST, DR HOCHRIEN, DID NOT HAVE NOTE IN EPIC STATING ABOUT TEE RESULTS AND PLAN FOR PT. ALSO, PT STATED HE WAS NEVER TOLD OF RESULTS.  DR FITZGERALD MDA, STATED WOULD NEED DR HOCHREIN TO NOTATE IN EPIC OF RESULTS AND PLAN FOR PT.  CALLED VIA PHONE AND LM FOR [place], OR SCHEDULER FOR DR Diona Fanti.  ADDENDUM:   NOTED DR HOCHREIN'S  NOTE IN EPIC .  HE HAD SPOKEN TO PT AND ALSO OK FOR SURGERY W/ PROCAUTION OF VOLUME OVERLOAD.  REVIEWED W/ DR ROSE MDA, OK TO PROCEED.

## 2016-04-17 NOTE — Telephone Encounter (Signed)
-----   Message from Minus Breeding, MD sent at 01/27/2016 10:46 AM EST -----   ----- Message ----- From: Interface, Rad Results In Sent: 12/22/2015   5:54 PM To: Minus Breeding, MD

## 2016-04-17 NOTE — Telephone Encounter (Signed)
New message   Need to speak with nurse- regarding severe mitral valve replacement - TEE done last Nov. No plan no follow up .   Surgery center is requesting info  Request for surgical clearance:  1. What type of surgery is being performed? Kidney stone   2. When is this surgery scheduled? 3.8.2018 @ [facility]  3. Are there any medications that need to be held prior to surgery and how long?   4. Name of physician performing surgery? Dr. Dyanne Carrel  5. What is your office phone and fax number? [phone] / ext [phone]. fax [phone]

## 2016-04-19 MED ORDER — BUPIVACAINE LIPOSOME 1.3 % IJ SUSP
INTRAMUSCULAR | Status: AC
  Filled 2016-04-19: qty 20

## 2016-04-19 MED ORDER — SODIUM CHLORIDE 0.9 % IJ SOLN
INTRAMUSCULAR | Status: AC
  Filled 2016-04-19: qty 50

## 2016-04-19 NOTE — Telephone Encounter (Signed)
I called David Carr yesterday and he finally answered his phone.  We discussed his TEE and MR.  He understands that he will be referred to discuss elective repair if his severe MR.  He would consent to considering having this done in the next three months or so.  He has no symptoms.  He is very active.  He does need to have kidney stone extraction.  He has no absolute contraindication to this based on ACC/AHA guidelines.  He would be at higher risk for pulmonary edema, acute CHF and atrial arrhythmias/atrial fib.  Preop, intra op and post op precautions to avoid volume overload should be observed.  However, no further cardiovascular testing is indicated prior to the planned urologic procedure.

## 2016-04-20 ENCOUNTER — Ambulatory Visit (HOSPITAL_BASED_OUTPATIENT_CLINIC_OR_DEPARTMENT_OTHER): Payer: 59 | Admitting: Anesthesiology

## 2016-04-20 ENCOUNTER — Ambulatory Visit (HOSPITAL_BASED_OUTPATIENT_CLINIC_OR_DEPARTMENT_OTHER)
Admission: RE | Admit: 2016-04-20 | Discharge: 2016-04-20 | Disposition: A | Discharge status: Home / Self Care | Payer: 59 | Source: Ambulatory Visit | Attending: Urology | Admitting: Urology

## 2016-04-20 ENCOUNTER — Telehealth: Payer: Self-pay | Admitting: *Deleted

## 2016-04-20 ENCOUNTER — Encounter (HOSPITAL_BASED_OUTPATIENT_CLINIC_OR_DEPARTMENT_OTHER): Payer: Self-pay | Admitting: Anesthesiology

## 2016-04-20 ENCOUNTER — Encounter (HOSPITAL_BASED_OUTPATIENT_CLINIC_OR_DEPARTMENT_OTHER)
Admission: RE | Disposition: A | Discharge status: Home / Self Care | Payer: Self-pay | Source: Ambulatory Visit | Attending: Urology

## 2016-04-20 DIAGNOSIS — I059 Rheumatic mitral valve disease, unspecified: Secondary | ICD-10-CM

## 2016-04-20 DIAGNOSIS — E119 Type 2 diabetes mellitus without complications: Secondary | ICD-10-CM | POA: Diagnosis not present

## 2016-04-20 DIAGNOSIS — N132 Hydronephrosis with renal and ureteral calculous obstruction: Secondary | ICD-10-CM | POA: Diagnosis not present

## 2016-04-20 DIAGNOSIS — Z79899 Other long term (current) drug therapy: Secondary | ICD-10-CM | POA: Insufficient documentation

## 2016-04-20 DIAGNOSIS — I10 Essential (primary) hypertension: Secondary | ICD-10-CM | POA: Diagnosis not present

## 2016-04-20 DIAGNOSIS — Z87442 Personal history of urinary calculi: Secondary | ICD-10-CM | POA: Insufficient documentation

## 2016-04-20 DIAGNOSIS — Z794 Long term (current) use of insulin: Secondary | ICD-10-CM | POA: Insufficient documentation

## 2016-04-20 DIAGNOSIS — Z87891 Personal history of nicotine dependence: Secondary | ICD-10-CM | POA: Insufficient documentation

## 2016-04-20 DIAGNOSIS — C9201 Acute myeloblastic leukemia, in remission: Secondary | ICD-10-CM | POA: Diagnosis not present

## 2016-04-20 DIAGNOSIS — Z7982 Long term (current) use of aspirin: Secondary | ICD-10-CM | POA: Insufficient documentation

## 2016-04-20 DIAGNOSIS — N201 Calculus of ureter: Secondary | ICD-10-CM | POA: Diagnosis present

## 2016-04-20 DIAGNOSIS — E785 Hyperlipidemia, unspecified: Secondary | ICD-10-CM | POA: Diagnosis not present

## 2016-04-20 HISTORY — DX: Type 2 diabetes mellitus with hypoglycemia without coma: E11.649

## 2016-04-20 HISTORY — DX: Presence of dental prosthetic device (complete) (partial): Z97.2

## 2016-04-20 HISTORY — DX: Other obstructive and reflux uropathy: N13.8

## 2016-04-20 HISTORY — DX: Personal history of urinary calculi: Z87.442

## 2016-04-20 HISTORY — DX: Nonrheumatic mitral (valve) prolapse: I34.1

## 2016-04-20 HISTORY — DX: Long term (current) use of insulin: Z79.4

## 2016-04-20 HISTORY — DX: Other obstructive and reflux uropathy: N40.1

## 2016-04-20 HISTORY — PX: CYSTOSCOPY WITH RETROGRADE PYELOGRAM, URETEROSCOPY AND STENT PLACEMENT: SHX5789

## 2016-04-20 HISTORY — PX: HOLMIUM LASER APPLICATION: SHX5852

## 2016-04-20 HISTORY — DX: Other primary thrombocytopenia: D69.49

## 2016-04-20 HISTORY — DX: Nonrheumatic mitral (valve) insufficiency: I34.0

## 2016-04-20 LAB — POCT I-STAT, CHEM 8
BUN: 22 mg/dL — ABNORMAL HIGH (ref 6–20)
CALCIUM ION: 1.25 mmol/L (ref 1.15–1.40)
CHLORIDE: 102 mmol/L (ref 101–111)
CREATININE: 1.4 mg/dL — AB (ref 0.61–1.24)
Glucose, Bld: 132 mg/dL — ABNORMAL HIGH (ref 65–99)
HCT: 43 % (ref 39.0–52.0)
Hemoglobin: 14.6 g/dL (ref 13.0–17.0)
Potassium: 4.2 mmol/L (ref 3.5–5.1)
Sodium: 139 mmol/L (ref 135–145)
TCO2: 26 mmol/L (ref 0–100)

## 2016-04-20 LAB — GLUCOSE, CAPILLARY: GLUCOSE-CAPILLARY: 128 mg/dL — AB (ref 65–99)

## 2016-04-20 SURGERY — CYSTOURETEROSCOPY, WITH RETROGRADE PYELOGRAM AND STENT INSERTION
Anesthesia: General | Site: Renal | Laterality: Right

## 2016-04-20 MED ORDER — FENTANYL CITRATE (PF) 100 MCG/2ML IJ SOLN
INTRAMUSCULAR | Status: AC
Start: 2016-04-20 — End: 2016-04-20
  Filled 2016-04-20: qty 2

## 2016-04-20 MED ORDER — OXYBUTYNIN CHLORIDE 5 MG PO TABS
ORAL_TABLET | ORAL | Status: AC
  Filled 2016-04-20: qty 1

## 2016-04-20 MED ORDER — CEFAZOLIN SODIUM-DEXTROSE 2-4 GM/100ML-% IV SOLN
INTRAVENOUS | Status: AC
  Filled 2016-04-20: qty 100

## 2016-04-20 MED ORDER — FENTANYL CITRATE (PF) 100 MCG/2ML IJ SOLN
INTRAMUSCULAR | Status: DC | PRN
  Administered 2016-04-20: 25 ug via INTRAVENOUS
  Administered 2016-04-20: 50 ug via INTRAVENOUS
  Administered 2016-04-20: 25 ug via INTRAVENOUS

## 2016-04-20 MED ORDER — CEFAZOLIN IN D5W 1 GM/50ML IV SOLN
1.0000 g | INTRAVENOUS | Status: DC
  Filled 2016-04-20: qty 50

## 2016-04-20 MED ORDER — OXYBUTYNIN CHLORIDE 5 MG PO TABS
5.0000 mg | ORAL_TABLET | Freq: Three times a day (TID) | ORAL | Status: DC
  Administered 2016-04-20: 5 mg via ORAL
  Filled 2016-04-20: qty 1

## 2016-04-20 MED ORDER — SCOPOLAMINE 1 MG/3DAYS TD PT72
MEDICATED_PATCH | TRANSDERMAL | Status: DC | PRN
  Administered 2016-04-20: 1 via TRANSDERMAL

## 2016-04-20 MED ORDER — SUCCINYLCHOLINE CHLORIDE 20 MG/ML IJ SOLN
INTRAMUSCULAR | Status: DC | PRN
  Administered 2016-04-20: 120 mg via INTRAVENOUS

## 2016-04-20 MED ORDER — CEFAZOLIN SODIUM-DEXTROSE 2-4 GM/100ML-% IV SOLN
2.0000 g | INTRAVENOUS | Status: AC
  Administered 2016-04-20: 2 g via INTRAVENOUS
  Filled 2016-04-20: qty 100

## 2016-04-20 MED ORDER — LIDOCAINE 2% (20 MG/ML) 5 ML SYRINGE
INTRAMUSCULAR | Status: DC | PRN
  Administered 2016-04-20: 80 mg via INTRAVENOUS

## 2016-04-20 MED ORDER — ONDANSETRON HCL 4 MG/2ML IJ SOLN
INTRAMUSCULAR | Status: AC
  Filled 2016-04-20: qty 2

## 2016-04-20 MED ORDER — MIDAZOLAM HCL 2 MG/2ML IJ SOLN
INTRAMUSCULAR | Status: AC
  Filled 2016-04-20: qty 2

## 2016-04-20 MED ORDER — EPHEDRINE SULFATE-NACL 50-0.9 MG/10ML-% IV SOSY
PREFILLED_SYRINGE | INTRAVENOUS | Status: DC | PRN
  Administered 2016-04-20: 15 mg via INTRAVENOUS

## 2016-04-20 MED ORDER — SODIUM CHLORIDE 0.9 % IR SOLN
Status: DC | PRN
  Administered 2016-04-20: 4000 mL

## 2016-04-20 MED ORDER — SCOPOLAMINE 1 MG/3DAYS TD PT72
MEDICATED_PATCH | TRANSDERMAL | Status: AC
  Filled 2016-04-20: qty 1

## 2016-04-20 MED ORDER — CEPHALEXIN 500 MG PO CAPS: 500.0000 mg | ORAL_CAPSULE | Freq: Two times a day (BID) | ORAL | 0 refills | Status: DC

## 2016-04-20 MED ORDER — IOHEXOL 300 MG/ML  SOLN
INTRAMUSCULAR | Status: DC | PRN
  Administered 2016-04-20: 16 mL

## 2016-04-20 MED ORDER — DEXAMETHASONE SODIUM PHOSPHATE 4 MG/ML IJ SOLN
INTRAMUSCULAR | Status: DC | PRN
  Administered 2016-04-20: 10 mg via INTRAVENOUS

## 2016-04-20 MED ORDER — MIDAZOLAM HCL 5 MG/5ML IJ SOLN
INTRAMUSCULAR | Status: DC | PRN
  Administered 2016-04-20: 2 mg via INTRAVENOUS

## 2016-04-20 MED ORDER — DEXAMETHASONE SODIUM PHOSPHATE 10 MG/ML IJ SOLN
INTRAMUSCULAR | Status: AC
  Filled 2016-04-20: qty 1

## 2016-04-20 MED ORDER — PROPOFOL 10 MG/ML IV BOLUS
INTRAVENOUS | Status: DC | PRN
  Administered 2016-04-20: 130 mg via INTRAVENOUS

## 2016-04-20 MED ORDER — ROCURONIUM BROMIDE 50 MG/5ML IV SOSY
PREFILLED_SYRINGE | INTRAVENOUS | Status: AC
  Filled 2016-04-20: qty 5

## 2016-04-20 MED ORDER — OXYBUTYNIN CHLORIDE 5 MG PO TABS: 5.0000 mg | ORAL_TABLET | Freq: Three times a day (TID) | ORAL | 1 refills | Status: DC | PRN

## 2016-04-20 MED ORDER — LACTATED RINGERS IV SOLN
INTRAVENOUS | Status: DC
  Administered 2016-04-20: 14:00:00 via INTRAVENOUS
  Filled 2016-04-20: qty 1000

## 2016-04-20 MED ORDER — ONDANSETRON HCL 4 MG/2ML IJ SOLN
INTRAMUSCULAR | Status: DC | PRN
  Administered 2016-04-20: 4 mg via INTRAVENOUS

## 2016-04-20 MED ORDER — PROPOFOL 10 MG/ML IV BOLUS
INTRAVENOUS | Status: AC
  Filled 2016-04-20: qty 40

## 2016-04-20 SURGICAL SUPPLY — 31 items
ADAPTER IRRIG TUBE 2 SPIKE SOL (ADAPTER) IMPLANT
ADPR TBG 2 SPK PMP STRL ASCP (ADAPTER)
BAG DRAIN URO-CYSTO SKYTR STRL (DRAIN) ×3 IMPLANT
BAG DRN UROCATH (DRAIN) ×1
BASKET STONE 1.7 NGAGE (UROLOGICAL SUPPLIES) ×2 IMPLANT
CATH INTERMIT  6FR 70CM (CATHETERS) ×2 IMPLANT
CLOTH BEACON ORANGE TIMEOUT ST (SAFETY) ×3 IMPLANT
FIBER LASER FLEXIVA 365 (UROLOGICAL SUPPLIES) ×2 IMPLANT
GLOVE BIO SURGEON STRL SZ8 (GLOVE) ×3 IMPLANT
GLOVE BIOGEL PI IND STRL 7.0 (GLOVE) IMPLANT
GLOVE BIOGEL PI INDICATOR 7.0 (GLOVE) ×4
GLOVE ECLIPSE 7.0 STRL STRAW (GLOVE) ×2 IMPLANT
GOWN STRL REUS W/ TWL LRG LVL3 (GOWN DISPOSABLE) ×1 IMPLANT
GOWN STRL REUS W/ TWL XL LVL3 (GOWN DISPOSABLE) ×1 IMPLANT
GOWN STRL REUS W/TWL LRG LVL3 (GOWN DISPOSABLE)
GOWN STRL REUS W/TWL XL LVL3 (GOWN DISPOSABLE) ×4 IMPLANT
GUIDEWIRE 0.038 PTFE COATED (WIRE) IMPLANT
GUIDEWIRE ANG ZIPWIRE 038X150 (WIRE) IMPLANT
GUIDEWIRE STR DUAL SENSOR (WIRE) ×2 IMPLANT
IV NS 1000ML (IV SOLUTION) ×3
IV NS 1000ML BAXH (IV SOLUTION) IMPLANT
IV NS IRRIG 3000ML ARTHROMATIC (IV SOLUTION) ×3 IMPLANT
KIT RM TURNOVER CYSTO AR (KITS) ×3 IMPLANT
LASER FIBER DISP (UROLOGICAL SUPPLIES) IMPLANT
MANIFOLD NEPTUNE II (INSTRUMENTS) ×2 IMPLANT
NS IRRIG 500ML POUR BTL (IV SOLUTION) IMPLANT
PACK CYSTO (CUSTOM PROCEDURE TRAY) ×3 IMPLANT
SHEATH ACCESS URETERAL 38CM (SHEATH) ×2 IMPLANT
STENT URET 6FRX24 CONTOUR (STENTS) ×2 IMPLANT
TUBE CONNECTING 12'X1/4 (SUCTIONS)
TUBE CONNECTING 12X1/4 (SUCTIONS) IMPLANT

## 2016-04-20 NOTE — Transfer of Care (Signed)
Immediate Anesthesia Transfer of Care Note  Patient: David Carr.  Procedure(s) Performed: Procedure(s): CYSTOSCOPY WITH RETROGRADE PYELOGRAM, URETEROSCOPY , STONE BASKETRY AND STENT PLACEMENT (Right) HOLMIUM LASER APPLICATION (Right)  Patient Location: PACU  Anesthesia Type:General  Level of Consciousness: awake, alert , oriented and patient cooperative  Airway & Oxygen Therapy: Patient Spontanous Breathing and Patient connected to face mask oxygen  Post-op Assessment: Report given to RN and Post -op Vital signs reviewed and stable  Post vital signs: Reviewed and stable  Last Vitals:  Vitals:   04/20/16 1301 04/20/16 1554  BP: (!) 159/76   Pulse: 72   Resp: 16   Temp: 36.3 C (P) 36.9 C    Last Pain:  Vitals:   04/20/16 1301  TempSrc: Oral      Patients Stated Pain Goal: 6 (14/10/30 1314)  Complications: No apparent anesthesia complications

## 2016-04-20 NOTE — Telephone Encounter (Signed)
-----   Message from Minus Breeding, MD sent at 04/19/2016  9:22 AM EST ----- Please make sure that the telephone preop clearance that I did today gets to urology.  Also, this patient needs an appt to see Dr. Roxy Manns as a new patient for valve repair.  He does not answer his phone but only listens to messages .  He will not call back if we do not leave a call back number he informs me.  He will want to discuss the timing of a surgical office appointment prior to making the appt.

## 2016-04-20 NOTE — Discharge Instructions (Signed)
POSTOPERATIVE CARE AFTER URETEROSCOPY  Stent management  *Stents are often left in after ureteroscopy and stone treatment. If left in, they often cause urinary frequency, urgency, occasional blood in the urine, as well as flank discomfort with urination. These are all expected issues, and should resolve after the stent is removed. *Often times, a small thread is left on the end of the stent, and brought out through the urethra. If so, this is used to remove the stent, making it unnecessary to look in the bladder with a scope in the office to remove the stent. If a thread is left on, did not pull on it until instructed. It is okay to pull on the string to remove the stent.  Wait until next Wednesday, March 14 to do this  Diet  Once you have adequately recovered from anesthesia, you may gradually advance your diet, as tolerated, to your regular diet.  Activities  You may gradually increase your activities to your normal unrestricted level the day following your procedure.  Medications  You should resume all preoperative medications. If you are on aspirin-like compounds, you should not resume these until the blood clears from your urine. If given an antibiotic by the surgeon, take these until they are completed. You may also be given, if you have a stent, medications to decrease the urinary frequency and urgency.  Pain  After ureteroscopy, there may be some pain on the side of the scope. Take your pain medicine for this. Usually, this pain resolves within a day or 2.  Fever  Please report any fever over 100 to the doctor.  Post Anesthesia Home Care Instructions  Activity: Get plenty of rest for the remainder of the day. A responsible adult should stay with you for 24 hours following the procedure.  For the next 24 hours, DO NOT: -Drive a car -Paediatric nurse -Drink alcoholic beverages -Take any medication unless instructed by your physician -Make any legal decisions or sign  important papers.  Meals: Start with liquid foods such as gelatin or soup. Progress to regular foods as tolerated. Avoid greasy, spicy, heavy foods. If nausea and/or vomiting occur, drink only clear liquids until the nausea and/or vomiting subsides. Call your physician if vomiting continues.  Special Instructions/Symptoms: Your throat may feel dry or sore from the anesthesia or the breathing tube placed in your throat during surgery. If this causes discomfort, gargle with warm salt water. The discomfort should disappear within 24 hours.  If you had a scopolamine patch placed behind your ear for the management of post- operative nausea and/or vomiting:  1. The medication in the patch is effective for 72 hours, after which it should be removed.  Wrap patch in a tissue and discard in the trash. Wash hands thoroughly with soap and water. 2. You may remove the patch earlier than 72 hours if you experience unpleasant side effects which may include dry mouth, dizziness or visual disturbances. 3. Avoid touching the patch. Wash your hands with soap and water after contact with the patch.

## 2016-04-20 NOTE — Telephone Encounter (Signed)
I discussed w patient. Procedure is on schedule for urology procedure today. He also voiced understanding of referral for Dr. Guy Sandifer office to discuss repair of mitral valve. Asked that no calls be made this week while he recovers from surgery. Will take calls after Monday of next week. I'll note this in the referral order.

## 2016-04-20 NOTE — Anesthesia Preprocedure Evaluation (Addendum)
Anesthesia Evaluation  Patient identified by MRN, date of birth, ID band Patient awake    Reviewed: Allergy & Precautions, NPO status , Patient's Chart, lab work & pertinent test results  Airway Mallampati: II  TM Distance: >3 FB     Dental   Pulmonary former smoker,    breath sounds clear to auscultation       Cardiovascular Exercise Tolerance: Poor hypertension, Pt. on medications and Pt. on home beta blockers + Valvular Problems/Murmurs MR  Rhythm:Regular Rate:Normal     Neuro/Psych    GI/Hepatic negative GI ROS, Neg liver ROS,   Endo/Other  diabetes, Well Controlled, Type 2  Renal/GU History noted. CG     Musculoskeletal   Abdominal   Peds  Hematology   Anesthesia Other Findings Pt c/o ongoing nausea for 3 days. "I can't eat, I'm too sick to my stomach".  Reproductive/Obstetrics                           Anesthesia Physical Anesthesia Plan  ASA: III  Anesthesia Plan: General   Post-op Pain Management:    Induction: Intravenous  Airway Management Planned: LMA  Additional Equipment:   Intra-op Plan:   Post-operative Plan: Extubation in OR  Informed Consent: I have reviewed the patients History and Physical, chart, labs and discussed the procedure including the risks, benefits and alternatives for the proposed anesthesia with the patient or authorized representative who has indicated his/her understanding and acceptance.   Dental advisory given  Plan Discussed with: CRNA and Anesthesiologist  Anesthesia Plan Comments:         Anesthesia Quick Evaluation

## 2016-04-20 NOTE — H&P (Signed)
Urology History and Physical Exam  CC: Kidney stone  HPI: 71 year old male presents for ureteroscopic management of a moderate/large right midureteral calculus that has not successfully been treated with medical expulsive therapy.  He is symptomatic from this.  He's been followed up twice in the office for this.  I have discussed stone management including lithotripsy versus direct ureteroscopy.  Risks and complications of each were discussed.  Since this is over the sacrum, I feel that this would be better treated with ureteroscopy.  He presents for that procedure.  He has been cleared from a cardiac standpoint by Dr. Percival Spanish.  PMH: Past Medical History:  Diagnosis Date  . AML (acute myeloid leukemia) in remission [facility]) oncologist-  dr Alvy Bimler-- per last note 10/ 2017 in remission 2 years   dx 09-11-2013  via bone marrow bx , FLT3 negative (NP M1 +)/  chemotherapy started 09-17-2013,  remission via marrow bx 11-06-2013,  4 cycles consolidation chemo w/ HiDAC 11-13-2013 to 03-05-2014  . BPH with urinary obstruction   . Chronic thrombocytopenic purpura ([place])   . Heart murmur   . History of kidney stones   . Hyperlipidemia   . Hypertension   . MVP (mitral valve prolapse)   . Pancytopenia, acquired ([place])   . Right ureteral stone   . Severe mitral regurgitation   . Type 2 diabetes mellitus with hypoglycemia, with long-term current use of insulin [facility])    endocrinologist-  dr Con Memos--  last A1c 7.5 on 01-18-2016  . Wears dentures    upper    PSH: Past Surgical History:  Procedure Laterality Date  . CARDIOVASCULAR STRESS TEST  07/23/2013   Low risk nuclear study w/ mild inferior ischemia/  normal LV function and wall motion, ef 69%  . EXTRACORPOREAL SHOCK WAVE LITHOTRIPSY  yrs ago  . INGUINAL HERNIA REPAIR Left 1990's  . ROTATOR CUFF REPAIR Right x2  last one 2011  . TEE WITHOUT CARDIOVERSION N/A 01/11/2016   Procedure: TRANSESOPHAGEAL ECHOCARDIOGRAM (TEE);  Surgeon: Pixie Casino,  MD;  Location: [facility] ENDOSCOPY;  Service: Cardiovascular;  Laterality: N/A; Severe MR associated w/ flail P2 segment of MV;  no LAA thromus;  small PFO by saline microbubble contrast after valsalva; LVEF 55-60%  . TENNIS ELBOW RELEASE/NIRSCHEL PROCEDURE Right 1990's  . TRANSTHORACIC ECHOCARDIOGRAM  12/22/2015   grade 1 diastolic dysfunction,  ef 55-60%/  moderate MVP involving posterior leaflet w/ partial flail and severe MR via CW doppler (peak gradient 43mHg)/  trivial TR    Allergies: Allergies  Allergen Reactions  . Morphine And Related     Shuts bladder down.  .Marland KitchenProchlorperazine Other (See Comments)    Heart arrythmia    Medications: No prescriptions prior to admission.     Social History: Social History   Social History  . Marital status: Married    Spouse name: N/A  . Number of children: N/A  . Years of education: N/A   Occupational History  . Not on file.   Social History Main Topics  . Smoking status: Former Smoker    Years: 20.00    Types: Cigarettes    Quit date: 04/18/1974  . Smokeless tobacco: Never Used  . Alcohol use No  . Drug use: No  . Sexual activity: Not on file   Other Topics Concern  . Not on file   Social History Narrative  . No narrative on file    Family History: Family History  Problem Relation Age of Onset  . Asthma  Sister   . Cancer Daughter     carcinoid tumor    Review of Systems: Positive: Right flank pain Negative:   A further 10 point review of systems was negative except what is listed in the HPI.                  Physical Exam: '@VITALS2'$ @ General: No acute distress.  Awake. Head:  Normocephalic.  Atraumatic. ENT:  EOMI.  Mucous membranes moist Neck:  Supple.  No lymphadenopathy. CV:  S1 present. S2 present. Regular rate. Pulmonary: Equal effort bilaterally.  Clear to auscultation bilaterally. Abdomen: Soft.  Non- tender to palpation. Skin:  Normal turgor.  No visible rash. Extremity: No gross deformity of bilateral  upper extremities.  No gross deformity of                             lower extremities. Neurologic: Alert. Appropriate mood.   Studies:  No results for input(s): HGB, WBC, PLT in the last 72 hours.  No results for input(s): NA, K, CL, CO2, BUN, CREATININE, CALCIUM, GFRNONAA, GFRAA in the last 72 hours.  Invalid input(s): MAGNESIUM   No results for input(s): INR, APTT in the last 72 hours.  Invalid input(s): PT   Invalid input(s): ABG    Assessment:  Moderate size right midureteral stone  Plan: Cystoscopy, right retrograde ureteropyelogram, ureteroscopy, holmium laser lithotripsy and extraction of stone, probable stent placement

## 2016-04-20 NOTE — Anesthesia Procedure Notes (Signed)
Procedure Name: Intubation Date/Time: 04/20/2016 2:48 PM Performed by: Wanita Chamberlain Pre-anesthesia Checklist: Patient identified, Timeout performed, Emergency Drugs available, Suction available and Patient being monitored Patient Re-evaluated:Patient Re-evaluated prior to inductionOxygen Delivery Method: Circle system utilized Preoxygenation: Pre-oxygenation with 100% oxygen Intubation Type: Rapid sequence, IV induction and Cricoid Pressure applied Laryngoscope Size: Glidescope and 4 Grade View: Grade II Tube type: Oral Tube size: 7.5 mm Number of attempts: 1 Airway Equipment and Method: Video-laryngoscopy and Rigid stylet Placement Confirmation: ETT inserted through vocal cords under direct vision,  positive ETCO2 and breath sounds checked- equal and bilateral Secured at: 22 cm Tube secured with: Tape Dental Injury: Teeth and Oropharynx as per pre-operative assessment and Injury to lip  Difficulty Due To: Difficulty was anticipated and Difficult Airway- due to anterior larynx

## 2016-04-20 NOTE — Op Note (Signed)
Preoperative diagnosis: 10 millimeter right midureteral stone  Postoperative diagnosis: 10 millimeter right distal ureteral stone  Principal procedure: Cystoscopy, right retrograde ureteropyelogram, fluoroscopic interpretation, holmium laser lithotripsy and extraction of right ureteral stone, placement of 6 French by 24 centimeter contour double-J stent with tether  Surgeon: Sonika Levins  Anesthesia: Gen. with LMA  Drains: 24 centimeter by 6 French contour double-J stent with tether  Specimen: Stone fragments, to the patient's family.  Complications: None  Estimated blood loss: None  Indications: 71 year old male with persistently symptomatic, nonpassing right midureteral stone.  He presents at this time for ureteroscopic management.  His stones in the past have been resistant to shock wave lithotripsy.  He understands risks and complications of the procedure.  These include but are not limited to anesthetic complication, infection, bleeding, ureteral trauma, as well as the need for possible stent afterwards.  Findings: Bladder appeared normal, without urothelial abnormalities.  Prostate was nonobstructive.  Urethra was without lesion or stricture.  Ureteral orifices were normal.  Retrograde ureteropyelogram revealed a normal distal ureter with a filling defect approximately 3-4 centimeters proximal to the UVJ.  Proximal to this, there was mild hydroureteronephrosis with pyelocaliectasis.  No filling defects were seen other than for this probable stone.  Description of procedure: The patient was properly identified and marked in the holding area.  He received preoperative IV antibiotics.  He was taken the operating room where general anesthetic was administered with the LMA.  He is placed in the dorsolithotomy position.  Genitalia and perineum were prepped and draped.  Proper timeout was performed.  A 21 French panendoscope was advanced under direct vision to the patient's urethra.  Urethra  was normal, prostate nonobstructive.  The right ureteral orifice was cannulated with the open-ended catheter, 6 [place].  Retrograde ureteropyelogram was performed.  The above mentioned findings were seen.  Following retrograde pyelogram, a 0.038 inch sensor-tip guidewire was easily advanced through the open-ended catheter, by the stone, and up into the upper pole calyceal system.  The cystoscope, and the open-ended catheter were then removed.  I dilated the patient's ureteral orifice with the inner core of a 12/14 medium length ureteral access catheter.  Following this, I removed the access catheter, and negotiated a 6 French semirigid ureteroscope through the urethra and up into the right ureter.  The stone was easily encountered 4-5 centimeters proximal.  It was quite large, and I saw that at this point, laser fragmentation was necessary.  A 365 micron fiber was advanced through the working channel in the ureteroscope.  Using laser energy-20 hertz, 0.5 joules, the stone was fragmented into multiple smaller fragments.  The stone was quite dense, and it took some time to adequately fragment the stone into smaller fragments, which would easily be extracted.  The engage basket was used to grasp on significant fragments and remove them into the bladder.  Careful inspection was then made of the distal, mid and proximal ureter with the ureteroscope.  No further significant fragments were noted.  Because of the ureteral inflammation where the stone had been, I strongly felt that a stent should be placed.  The ureteroscope was removed.  I backloaded the guidewire through the cystoscope.  I then passed, without difficulty, a 24 centimeter, 6 French contour double-J stent.  The tether was left on.  Good proximal and distal curls were seen using fluoroscopy and cystoscopy.  Following removal of the guidewire.  I then removed the scope after bladder drainage.  The thread was then taped to  the patient's penis.  The scope was  then passed again, and small fragments of the stone in the bladder were removed.  At this point, the patient was awakened and taken to the PACU in stable condition.  He tolerated the procedure well.

## 2016-04-20 NOTE — Interval H&P Note (Signed)
History and Physical Interval Note:  04/20/2016 2:35 PM  David Carr.  has presented today for surgery, with the diagnosis of RIGHT URETERAL STONE  The various methods of treatment have been discussed with the patient and family. After consideration of risks, benefits and other options for treatment, the patient has consented to  Procedure(s): CYSTOSCOPY WITH RETROGRADE PYELOGRAM, URETEROSCOPY AND STENT PLACEMENT (Right) HOLMIUM LASER APPLICATION (Right) as a surgical intervention .  The patient's history has been reviewed, patient examined, no change in status, stable for surgery.  I have reviewed the patient's chart and labs.  Questions were answered to the patient's satisfaction.     Jorja Loa

## 2016-04-21 ENCOUNTER — Encounter (HOSPITAL_BASED_OUTPATIENT_CLINIC_OR_DEPARTMENT_OTHER): Payer: Self-pay | Admitting: Urology

## 2016-04-21 NOTE — Anesthesia Postprocedure Evaluation (Signed)
Anesthesia Post Note  Patient: Deniro Laymon.  Procedure(s) Performed: Procedure(s) (LRB): CYSTOSCOPY WITH RETROGRADE PYELOGRAM, URETEROSCOPY , STONE BASKETRY AND STENT PLACEMENT (Right) HOLMIUM LASER APPLICATION (Right)  Patient location during evaluation: PACU Anesthesia Type: General Level of consciousness: awake and alert Pain management: pain level controlled Vital Signs Assessment: post-procedure vital signs reviewed and stable Respiratory status: spontaneous breathing, nonlabored ventilation, respiratory function stable and patient connected to nasal cannula oxygen Cardiovascular status: blood pressure returned to baseline and stable Postop Assessment: no signs of nausea or vomiting Anesthetic complications: no       Last Vitals:  Vitals:   04/20/16 1645 04/20/16 1717  BP: 124/78 (!) 146/69  Pulse: 81 76  Resp: 16 16  Temp:  36.7 C    Last Pain:  Vitals:   04/20/16 1554  TempSrc:   PainSc: Asleep                 Lynda Rainwater

## 2016-05-09 ENCOUNTER — Encounter: Payer: Self-pay | Admitting: Internal Medicine

## 2016-05-09 ENCOUNTER — Ambulatory Visit (INDEPENDENT_AMBULATORY_CARE_PROVIDER_SITE_OTHER): Payer: 59 | Admitting: Internal Medicine

## 2016-05-09 VITALS — BP 140/82 | HR 84 | Ht 68.0 in | Wt 200.0 lb

## 2016-05-09 DIAGNOSIS — E11649 Type 2 diabetes mellitus with hypoglycemia without coma: Secondary | ICD-10-CM

## 2016-05-09 DIAGNOSIS — Z794 Long term (current) use of insulin: Secondary | ICD-10-CM | POA: Diagnosis not present

## 2016-05-09 LAB — POCT GLYCOSYLATED HEMOGLOBIN (HGB A1C): HEMOGLOBIN A1C: 7

## 2016-05-09 MED ORDER — METFORMIN HCL ER 500 MG PO TB24: 500.0000 mg | ORAL_TABLET | Freq: Every day | ORAL | 3 refills | Status: DC

## 2016-05-09 NOTE — Addendum Note (Signed)
Addended by: Caprice Beaver T on: 05/09/2016 03:52 PM   Modules accepted: Orders

## 2016-05-09 NOTE — Patient Instructions (Addendum)
Please continue: - Basaglar 30 units at bedtime.  Restart: - Metformin ER 500 mg with dinner.  Please return in 3 months with your sugar log.

## 2016-05-09 NOTE — Progress Notes (Signed)
Patient ID: David Carr David Carr., male   DOB: 12-19-1945, 71 y.o.   MRN: [id number]   HPI: David Buhl. is a 71 y.o.-year-old male, returning for f/u for DM2, dx in end of the 1990s, insulin-dependent since ~2014, uncontrolled, without long term complications. Last visit 2 mo ago.  He had kidney stone surgery earlier this month.   Last hemoglobin A1c was: Lab Results  Component Value Date   HGBA1C 7.5 (H) 01/18/2016   HGBA1C 7.8 (H) 10/21/2015   HGBA1C 8.0 (H) 04/15/2015   Pt was on a regimen of: - Humalog 75/25 35 >> increased to 45 units at bedtime! - Metformin 500 mg 2x a day, with meals >> now 500 mg at dinnertime  At last visit, we changed to: - Metformin ER 1000 mg with dinner >> constipation >> stopped 2 days ago. - Basaglar 30 units at bedtime.  Pt checks his sugars 3x a week and they are: - am: 90s-110 >> 100-115 - 2h after b'fast: n/c - before lunch: n/c - 2h after lunch: n/c - before dinner: n/c - 2h after dinner: n/c - bedtime: n/c >> 140-180s - nighttime: n/c + frequent lows. Lowest sugar was 50 - at 2-3 am >> 103; he has hypoglycemia awareness - but ? Level.  Highest sugar was 240s >> 265 (after surgery)  Glucometer: Freestyle Lite  Pt's meals are: - Breakfast: coffee + nab or slice pound cake + pop tarts - Lunch: fast food or sandwich or skips - Dinner: meat + veggies  - Snacks: 1, before bedtime Quit sodas. He walks 2x day: 1 mi - with his black lab.  - no CKD, last BUN/creatinine:  Lab Results  Component Value Date   BUN 22 (H) 04/20/2016   BUN 13 01/03/2016   CREATININE 1.40 (H) 04/20/2016   CREATININE 0.90 01/03/2016   - last set of lipids: Lab Results  Component Value Date   CHOL 130 10/21/2015   HDL 44.70 10/21/2015   LDLCALC 71 10/21/2015   LDLDIRECT 74.4 06/19/2006   TRIG 74.0 10/21/2015   CHOLHDL 3 10/21/2015  On Zocor. - last eye exam was in 2013. No DR.  - no numbness and tingling in his feet.  He has AML in  remission.  ROS: Constitutional: no weight gain/loss, no fatigue, no subjective hyperthermia/hypothermia Eyes: no blurry vision, no xerophthalmia ENT: no sore throat, no nodules palpated in throat, no dysphagia/odynophagia, no hoarseness Cardiovascular: no CP/SOB/palpitations/leg swelling Respiratory: no cough/SOB Gastrointestinal: + N/no V/D/+ C Musculoskeletal: no muscle/joint aches Skin: no rashes Neurological: no tremors/numbness/tingling/dizziness  I reviewed pt's medications, allergies, PMH, social hx, family hx, and changes were documented in the history of present illness. Otherwise, unchanged from my initial visit note.  Past Medical History:  Diagnosis Date  . AML (acute myeloid leukemia) in remission [facility]) oncologist-  dr Alvy Bimler-- per last note 10/ 2017 in remission 2 years   dx 09-11-2013  via bone marrow bx , FLT3 negative (NP M1 +)/  chemotherapy started 09-17-2013,  remission via marrow bx 11-06-2013,  4 cycles consolidation chemo w/ HiDAC 11-13-2013 to 03-05-2014  . BPH with urinary obstruction   . Chronic thrombocytopenic purpura ([place])   . Heart murmur   . History of kidney stones   . Hyperlipidemia   . Hypertension   . MVP (mitral valve prolapse)   . Pancytopenia, acquired ([place])   . Right ureteral stone   . Severe mitral regurgitation   . Type 2 diabetes mellitus with hypoglycemia, with  long-term current use of insulin [facility])    endocrinologist-  dr Con Memos--  last A1c 7.5 on 01-18-2016  . Wears dentures    upper   Past Surgical History:  Procedure Laterality Date  . CARDIOVASCULAR STRESS TEST  07/23/2013   Low risk nuclear study w/ mild inferior ischemia/  normal LV function and wall motion, ef 69%  . CYSTOSCOPY WITH RETROGRADE PYELOGRAM, URETEROSCOPY AND STENT PLACEMENT Right 04/20/2016   Procedure: CYSTOSCOPY WITH RETROGRADE PYELOGRAM, URETEROSCOPY , STONE BASKETRY AND STENT PLACEMENT;  Surgeon: Franchot Gallo, MD;  Location[facility];   Service: Urology;  Laterality: Right;  . EXTRACORPOREAL SHOCK WAVE LITHOTRIPSY  yrs ago  . HOLMIUM LASER APPLICATION Right 06/19/4330   Procedure: HOLMIUM LASER APPLICATION;  Surgeon: Franchot Gallo, MD;  Location: [facility];  Service: Urology;  Laterality: Right;  . INGUINAL HERNIA REPAIR Left 1990's  . ROTATOR CUFF REPAIR Right x2  last one 2011  . TEE WITHOUT CARDIOVERSION N/A 01/11/2016   Procedure: TRANSESOPHAGEAL ECHOCARDIOGRAM (TEE);  Surgeon: Pixie Casino, MD;  Location: [facility] ENDOSCOPY;  Service: Cardiovascular;  Laterality: N/A; Severe MR associated w/ flail P2 segment of MV;  no LAA thromus;  small PFO by saline microbubble contrast after valsalva; LVEF 55-60%  . TENNIS ELBOW RELEASE/NIRSCHEL PROCEDURE Right 1990's  . TRANSTHORACIC ECHOCARDIOGRAM  12/22/2015   grade 1 diastolic dysfunction,  ef 55-60%/  moderate MVP involving posterior leaflet w/ partial flail and severe MR via CW doppler (peak gradient 15mHg)/  trivial TR   Social History   Social History  . Marital status: Married    Spouse name: N/A  . Number of children: 2   Occupational History  . accountant   Social History Main Topics  . Smoking status: Former SResearch scientist (life sciences) . Smokeless tobacco: Never Used  . Alcohol use No  . Drug use: No   Current Outpatient Prescriptions on File Prior to Visit  Medication Sig Dispense Refill  . acetaminophen (TYLENOL) 500 MG tablet Take 500 mg by mouth every 6 (six) hours as needed.    .Marland Kitchenaspirin 81 MG tablet Take 81 mg by mouth daily.    .Marland Kitchenatenolol (TENORMIN) 25 MG tablet Take 0.5 tablets (12.5 mg total) by mouth daily. (Patient taking differently: Take 12.5 mg by mouth every morning. ) 45 tablet 3  . Insulin Glargine (BASAGLAR KWIKPEN) 100 UNIT/ML SOPN Inject 30 units into the skin at bedtime. 10 pen 0  . oxybutynin (DITROPAN) 5 MG tablet Take 1 tablet (5 mg total) by mouth every 8 (eight) hours as needed for bladder spasms. 20 tablet 1  . oxyCODONE-acetaminophen  (PERCOCET/ROXICET) 5-325 MG tablet Take by mouth every 4 (four) hours as needed for severe pain.    . simvastatin (ZOCOR) 20 MG tablet Take 1 tablet (20 mg total) by mouth at bedtime. 90 tablet 3  . metFORMIN (GLUCOPHAGE-XR) 500 MG 24 hr tablet Take 2 tablets (1,000 mg total) by mouth daily with supper. (Patient not taking: Reported on 05/09/2016) 60 tablet 3   Current Facility-Administered Medications on File Prior to Visit  Medication Dose Route Frequency Provider Last Rate Last Dose  . heparin lock flush 100 unit/mL  500 Units Intravenous Once GBrunetta Genera MD      . sodium chloride 0.9 % injection 10 mL  10 mL Intravenous PRN GBrunetta Genera MD       He is also on: - Metformin 500 mg daily - Humalog 75/25 45 units at bedtime  Allergies  Allergen Reactions  . Morphine And Related     Shuts bladder down.  Marland Kitchen Prochlorperazine Other (See Comments)    Heart arrythmia   Family History  Problem Relation Age of Onset  . Asthma Sister   . Cancer Daughter     carcinoid tumor   PE: BP 140/82 (BP Location: Left Arm, Patient Position: Sitting)   Pulse 84   Ht '5\' 8"'$  (1.727 m)   Wt 200 lb (90.7 kg)   SpO2 94%   BMI 30.41 kg/m  Wt Readings from Last 3 Encounters:  05/09/16 200 lb (90.7 kg)  04/20/16 205 lb (93 kg)  03/16/16 211 lb (95.7 kg)   Constitutional: overweight, in NAD Eyes: PERRLA, EOMI, no exophthalmos ENT: moist mucous membranes, no thyromegaly, no cervical lymphadenopathy Cardiovascular: RRR, +1 SEM, no RG Respiratory: CTA B Gastrointestinal: abdomen soft, NT, ND, BS+ Musculoskeletal: no deformities, strength intact in all 4 Skin: moist, warm, no rashes Neurological: no tremor with outstretched hands, DTR normal in all 4  ASSESSMENT: 1. DM2, insulin-dependent, uncontrolled, without Long term complications, but with hypoglycemia  PLAN:  1. Patient with long-standing, prev. uncontrolled diabetes, on oral antidiabetic regimen (low dose Metformin) and now  basal insulin started at last visit; he needed to stop Metformin 2/2 constipation >> will restart at a lower dose. - HbA1c today has improved to 7.0%! - I suggested to:  Patient Instructions  Please continue: - Basaglar 30 units at bedtime.  Restart: - Metformin ER 500 mg with dinner.  Please return in 3 months with your sugar log.   - continue checking sugars at different times of the day - check 1-2 times a day, rotating checks - advised for yearly eye exams >> Needs one! - Return to clinic in 3 mo with sugar log   Philemon Kingdom, MD PhD [facility] Endocrinology

## 2016-05-09 NOTE — Telephone Encounter (Signed)
Patient ask me to take out all phone numbers out of our systems even his patient contacts. He do not want to get any calls or reminder call form our office. FYI

## 2016-05-15 ENCOUNTER — Telehealth: Payer: Self-pay | Admitting: Family Medicine

## 2016-05-15 NOTE — Telephone Encounter (Signed)
Spoke to Dr. Sherren Mocha and he advised that we do not do those injections in our office and the pt will need to go to the urologist.  Informed the pt.  Pt stated he will call urologist tomorrow to schedule injections.

## 2016-05-15 NOTE — Telephone Encounter (Signed)
Pt would like to come here to get his antibiotic injections for 3 consecutive days that [facility] is asking him to have and to get oral antibiotic to treat what is going on with the bacteria that has not cleared up from the 3 different times he has been on it in the past.  Pt has a bitter taste in his mouth about [facility].  It you need more information pt state that you can call him and he can go in detail with you.

## 2016-05-29 ENCOUNTER — Encounter: Payer: Self-pay | Admitting: Thoracic Surgery (Cardiothoracic Vascular Surgery)

## 2016-05-29 ENCOUNTER — Other Ambulatory Visit: Payer: Self-pay | Admitting: *Deleted

## 2016-05-29 ENCOUNTER — Other Ambulatory Visit: Payer: Self-pay | Admitting: Internal Medicine

## 2016-05-29 ENCOUNTER — Institutional Professional Consult (permissible substitution) (INDEPENDENT_AMBULATORY_CARE_PROVIDER_SITE_OTHER): Payer: 59 | Admitting: Thoracic Surgery (Cardiothoracic Vascular Surgery)

## 2016-05-29 VITALS — BP 120/74 | HR 68 | Resp 20 | Ht 68.0 in | Wt 200.0 lb

## 2016-05-29 DIAGNOSIS — I059 Rheumatic mitral valve disease, unspecified: Secondary | ICD-10-CM

## 2016-05-29 DIAGNOSIS — I080 Rheumatic disorders of both mitral and aortic valves: Secondary | ICD-10-CM | POA: Diagnosis not present

## 2016-05-29 DIAGNOSIS — I340 Nonrheumatic mitral (valve) insufficiency: Secondary | ICD-10-CM

## 2016-05-29 DIAGNOSIS — Z01818 Encounter for other preprocedural examination: Secondary | ICD-10-CM

## 2016-05-29 DIAGNOSIS — I7101 Dissection of thoracic aorta: Secondary | ICD-10-CM

## 2016-05-29 DIAGNOSIS — I7409 Other arterial embolism and thrombosis of abdominal aorta: Secondary | ICD-10-CM

## 2016-05-29 DIAGNOSIS — I71019 Dissection of thoracic aorta, unspecified: Secondary | ICD-10-CM

## 2016-05-29 NOTE — Progress Notes (Addendum)
[address]       [place] [address]             [phone]     CARDIOTHORACIC SURGERY CONSULTATION REPORT  Referring Provider is Minus Breeding, MD PCP is Joycelyn Man, MD  Chief Complaint  Patient presents with  . Mitral Regurgitation    Surgical eval, TEE 01/11/16     HPI:  Patient is a 71 year old male with history of mitral valve prolapse with mitral regurgitation, hypertension, type II diabetes, acute myeloid leukemia in remission, hyperlipidemia, and kidney stones who has been referred for surgical consultation to discuss management options for mitral valve prolapse with severe primary mitral regurgitation. The patient states that he was first noted to have a heart murmur on physical exam performed by his primary care physician many years ago. He was referred to Dr. Percival Spanish and has undergone transthoracic echocardiogram regularly in follow-up.   In 2015 he developed AML and underwent chemotherapy at [facility].  He recovered from this uneventfully and has remained in remission since. Last fall he underwent routine follow-up echocardiogram which revealed what appeared to be significant progression of severity of mitral regurgitation. Transesophageal echocardiogram was performed 01/11/2016 and confirmed the presence of a flail segment involving the middle portion of the posterior leaflet with ruptured chordae tendineae and severe mitral regurgitation. Left ventricular systolic function remained normal with ejection fraction estimated 55-60%. There was left atrial chamber enlargement. There was a small patent foramen ovale. He was referred for elective surgical consultation. In addition, the patient has recently had problems with kidney stones. He passed 2 of 3 stones on his own but recently had a third stone become lodged, requiring cystoscopy for extraction.    Patient is married and lives locally in [place] with his wife. He is retired  having previously worked as an Optometrist. He has remained fairly active physically during retirement although he states that his bout with AML and chemotherapy slowed down quite a bit several years ago. He still enjoys essentially normal physical activity without any significant limitations. He just recently underwent cystoscopy for extraction of an impacted kidney stone. Subsequent to this he had problems with severe constipation in the setting of prolonged oral antibiotics. All of this seems to be resolving. He states that he has began to experience mild symptoms of exertional shortness of breath. This occurs only with more strenuous physical exertion such as going up a flight of stairs or walking up a steep hill. Breathing is comfortable with ordinary activity. He has not had any PND, orthopnea, or lower extremity edema. He denies any history of chest tightness or chest pressure. He has had some dizzy spells without syncope. He denies any history of palpitations.  Past Medical History:  Diagnosis Date  . AML (acute myeloid leukemia) in remission [facility]) oncologist-  dr Alvy Bimler-- per last note 10/ 2017 in remission 2 years   dx 09-11-2013  via bone marrow bx , FLT3 negative (NP M1 +)/  chemotherapy started 09-17-2013,  remission via marrow bx 11-06-2013,  4 cycles consolidation chemo w/ HiDAC 11-13-2013 to 03-05-2014  . BPH with urinary obstruction   . Chronic thrombocytopenic purpura ([place])   . Heart murmur   . History of kidney stones   . Hyperlipidemia   . Hypertension   . MVP (mitral valve prolapse)   . Pancytopenia, acquired ([place])   . Right ureteral stone   . Severe mitral regurgitation   . Type 2  diabetes mellitus with hypoglycemia, with long-term current use of insulin [facility])    endocrinologist-  dr Con Memos  . Wears dentures    upper    Past Surgical History:  Procedure Laterality Date  . CARDIOVASCULAR STRESS TEST  07/23/2013   Low risk nuclear study w/ mild inferior ischemia/  normal  LV function and wall motion, ef 69%  . CYSTOSCOPY WITH RETROGRADE PYELOGRAM, URETEROSCOPY AND STENT PLACEMENT Right 04/20/2016   Procedure: CYSTOSCOPY WITH RETROGRADE PYELOGRAM, URETEROSCOPY , STONE BASKETRY AND STENT PLACEMENT;  Surgeon: Franchot Gallo, MD;  Location: [facility];  Service: Urology;  Laterality: Right;  . EXTRACORPOREAL SHOCK WAVE LITHOTRIPSY  yrs ago  . HOLMIUM LASER APPLICATION Right 10/16/2353   Procedure: HOLMIUM LASER APPLICATION;  Surgeon: Franchot Gallo, MD;  Location: [facility];  Service: Urology;  Laterality: Right;  . INGUINAL HERNIA REPAIR Left 1990's  . ROTATOR CUFF REPAIR Right x2  last one 2011  . TEE WITHOUT CARDIOVERSION N/A 01/11/2016   Procedure: TRANSESOPHAGEAL ECHOCARDIOGRAM (TEE);  Surgeon: Pixie Casino, MD;  Location: [facility] ENDOSCOPY;  Service: Cardiovascular;  Laterality: N/A; Severe MR associated w/ flail P2 segment of MV;  no LAA thromus;  small PFO by saline microbubble contrast after valsalva; LVEF 55-60%  . TENNIS ELBOW RELEASE/NIRSCHEL PROCEDURE Right 1990's  . TRANSTHORACIC ECHOCARDIOGRAM  12/22/2015   grade 1 diastolic dysfunction,  ef 55-60%/  moderate MVP involving posterior leaflet w/ partial flail and severe MR via CW doppler (peak gradient 33mHg)/  trivial TR    Family History  Problem Relation Age of Onset  . Asthma Sister   . Cancer Daughter     carcinoid tumor    Social History   Social History  . Marital status: Married    Spouse name: N/A  . Number of children: N/A  . Years of education: N/A   Occupational History  . Not on file.   Social History Main Topics  . Smoking status: Former Smoker    Years: 20.00    Types: Cigarettes    Quit date: 04/18/1974  . Smokeless tobacco: Never Used  . Alcohol use No  . Drug use: No  . Sexual activity: Not on file   Other Topics Concern  . Not on file   Social History Narrative  . No narrative on file    Current Outpatient Prescriptions    Medication Sig Dispense Refill  . aspirin 81 MG tablet Take 81 mg by mouth daily.    .Marland Kitchenatenolol (TENORMIN) 25 MG tablet Take 0.5 tablets (12.5 mg total) by mouth daily. (Patient taking differently: Take 12.5 mg by mouth every morning. ) 45 tablet 3  . Insulin Glargine (BASAGLAR KWIKPEN) 100 UNIT/ML SOPN Inject 30 units into the skin at bedtime. 10 pen 0  . metFORMIN (GLUCOPHAGE-XR) 500 MG 24 hr tablet Take 1 tablet (500 mg total) by mouth daily with supper. 60 tablet 3  . simvastatin (ZOCOR) 20 MG tablet Take 1 tablet (20 mg total) by mouth at bedtime. 90 tablet 3   No current facility-administered medications for this visit.    Facility-Administered Medications Ordered in Other Visits  Medication Dose Route Frequency Provider Last Rate Last Dose  . heparin lock flush 100 unit/mL  500 Units Intravenous Once GBrunetta Genera MD      . sodium chloride 0.9 % injection 10 mL  10 mL Intravenous PRN Gautam KJuleen China MD        Allergies  Allergen Reactions  . Morphine And  Related     Shuts bladder down.  Marland Kitchen Prochlorperazine Other (See Comments)    Heart arrythmia      Review of Systems:   General:  normal appetite, normal energy, no weight gain, no weight loss, no fever  Cardiac:  no chest pain with exertion, no chest pain at rest, + SOB with more strenuous exertion, no resting SOB, no PND, no orthopnea, no palpitations, no arrhythmia, no atrial fibrillation, no LE edema, + dizzy spells, no syncope  Respiratory:  + mild exertional shortness of breath, no home oxygen, no productive cough, no dry cough, no bronchitis, no wheezing, no hemoptysis, + cold weather induced asthma, no pain with inspiration or cough, no sleep apnea, no CPAP at night  GI:   no difficulty swallowing, + reflux, NO frequent heartburn, + hiatal hernia, no abdominal pain, + constipation, no diarrhea, no hematochezia, no hematemesis, no melena  GU:   no dysuria,  + frequency, no urinary tract infection, + hematuria,  no enlarged prostate, + kidney stones, no kidney disease  Vascular:  no pain suggestive of claudication, no pain in feet, no leg cramps, no varicose veins, no DVT, no non-healing foot ulcer  Neuro:   no stroke, no TIA's, no seizures, no headaches, no temporary blindness one eye,  no slurred speech, no peripheral neuropathy, no chronic pain, no instability of gait, no memory/cognitive dysfunction  Musculoskeletal: no arthritis, no joint swelling, no myalgias, no difficulty walking, normal mobility   Skin:   no rash, no itching, no skin infections, no pressure sores or ulcerations  Psych:   no anxiety, no depression, no nervousness, no unusual recent stress  Eyes:   no blurry vision, no floaters, no recent vision changes, + wears glasses only for reading  ENT:   no hearing loss, no loose or painful teeth, partial upper dentures, last saw dentist many years ago  Hematologic:  no easy bruising, no abnormal bleeding, no clotting disorder, no frequent epistaxis  Endocrine:  + diabetes, does check CBG's at home     Physical Exam:   BP 120/74   Pulse 68   Resp 20   Ht _0  (1.727 m)   Wt 200 lb (90.7 kg)   SpO2 97% Comment: RA  BMI 30.41 kg/m   General:    well-appearing  HEENT:  Unremarkable   Neck:   no JVD, no bruits, no adenopathy   Chest:   clear to auscultation, symmetrical breath sounds, no wheezes, no rhonchi   CV:   RRR, grade III/VI holosystolic murmur   Abdomen:  soft, non-tender, no masses   Extremities:  warm, well-perfused, pulses palpable, no LE edema  Rectal/GU  Deferred  Neuro:   Grossly non-focal and symmetrical throughout  Skin:   Clean and dry, no rashes, no breakdown   Diagnostic Tests:  Echocardiography  Patient:    Marwan, Lipe MR #:       [id number] Study Date: 12/22/2015 Gender:     M Age:        70 Height:     169.5 cm Weight:     95.5 kg BSA:        2.16 m^2 Pt. Status: Room:   ATTENDING    Minus Breeding, MD  ORDERING     Minus Breeding,  MD  REFERRING    Minus Breeding, MD  SONOGRAPHER  Cindy Hazy, RDCS  PERFORMING   [facility]  cc:  ------------------------------------------------------------------- LV EF: 55% -   60%  ------------------------------------------------------------------- Indications:  I34.0 Mitral Regurgitation.  ------------------------------------------------------------------- History:   PMH:  Acquired from the patient and from the patient&'s chart.  PMH:  History of Acute Myeloid Leukemia. Mitral regurgitation.  Risk factors:  Hypertension. Diabetes mellitus. Dyslipidemia.  ------------------------------------------------------------------- Study Conclusions  - Left ventricle: The cavity size was normal. Systolic function was   normal. The estimated ejection fraction was in the range of 55%   to 60%. Doppler parameters are consistent with abnormal left   ventricular relaxation (grade 1 diastolic dysfunction). - Mitral valve: Moderate prolapse, involving the posterior leaflet.   Probable flail motion involving the posterior leaflet. There was   mild regurgitation. There appears to be a partial flail of the   posterior MY leaflet. Visually MR appears mild at worst but   density of signal on CW Doppler suggests much more severe MR.   Would suggest TEE for further evaluation. Reviewed with Dr.   Aundra Dubin.  ------------------------------------------------------------------- Study data:   Study status:  Routine.  Procedure:  The patient reported no pain pre or post test. Transthoracic echocardiography for left ventricular function evaluation, for right ventricular function evaluation, and for assessment of valvular function. Image quality was adequate.  Study completion:  There were no complications.          Echocardiography.  M-mode, complete 2D, spectral Doppler, and color Doppler.  Birthdate:  Patient birthdate: 09-09-1945.  Age:  Patient is 71 yr old.  Sex:   Gender: male.    BMI: 33.2 kg/m^2.  Blood pressure:     120/70  Patient status:  Outpatient.  Study date:  Study date: 12/22/2015. Study time: 02:03 PM.  Location:  [facility]  -------------------------------------------------------------------  ------------------------------------------------------------------- Left ventricle:  The cavity size was normal. Systolic function was normal. The estimated ejection fraction was in the range of 55% to 60%. Doppler parameters are consistent with abnormal left ventricular relaxation (grade 1 diastolic dysfunction).  ------------------------------------------------------------------- Aortic valve:   Structurally normal valve.   Cusp separation was normal.  Doppler:  Transvalvular velocity was within the normal range. There was no stenosis. There was no regurgitation.  ------------------------------------------------------------------- Aorta:  Aortic root: The aortic root was normal in size. Ascending aorta: The ascending aorta was normal in size.  ------------------------------------------------------------------- Mitral valve:   Moderate prolapse, involving the posterior leaflet.  Probable flail motion involving the posterior leaflet. There appears to be a partial flail of the posterior MY leaflet. Visually MR appears mild at worst but density of signal on CW Doppler suggests much more severe MR. Would suggest TEE for further evaluation. Reviewed with Dr. Aundra Dubin.  Doppler:  There was mild regurgitation.    Peak gradient (D): 3 mm Hg.  ------------------------------------------------------------------- Left atrium:  The atrium was normal in size.  ------------------------------------------------------------------- Right ventricle:  The cavity size was normal. Wall thickness was normal. Systolic function was normal.  ------------------------------------------------------------------- Pulmonic valve:    Structurally normal  valve.   Cusp separation was normal.  Doppler:  Transvalvular velocity was within the normal range. There was no regurgitation.  ------------------------------------------------------------------- Tricuspid valve:   Structurally normal valve.   Leaflet separation was normal.  Doppler:  Transvalvular velocity was within the normal range. There was trivial regurgitation.  ------------------------------------------------------------------- Right atrium:  The atrium was normal in size.  ------------------------------------------------------------------- Pericardium:  The pericardium was normal in appearance.  ------------------------------------------------------------------- Systemic veins: Inferior vena cava: The vessel was normal in size. The respirophasic diameter changes were in the normal range (= 50%), consistent with normal central venous pressure.  ------------------------------------------------------------------- Measurements  Left ventricle                         Value        Reference  LV ID, ED, PLAX chordal                51.4  mm     43 - 52  LV ID, ES, PLAX chordal                26.1  mm     23 - 38  LV fx shortening, PLAX chordal         49    %      >=29  LV PW thickness, ED                    11.5  mm     ---------  IVS/LV PW ratio, ED                    0.99         <=1.3  Stroke volume, 2D                      74    ml     ---------  Stroke volume/bsa, 2D                  34    ml/m^2 ---------  LV e&', lateral                         9.54  cm/s   ---------  LV E/e&', lateral                       8.86         ---------  LV e&', medial                          8.66  cm/s   ---------  LV E/e&', medial                        9.76         ---------  LV e&', average                         9.1   cm/s   ---------  LV E/e&', average                       9.29         ---------    Ventricular septum                     Value        Reference  IVS thickness,  ED                      11.4  mm     ---------    LVOT                                   Value        Reference  LVOT ID, S  23    mm     ---------  LVOT area                              4.15  cm^2   ---------  LVOT ID                                23    mm     ---------  LVOT peak velocity, S                  97.8  cm/s   ---------  LVOT mean velocity, S                  70    cm/s   ---------  LVOT VTI, S                            17.9  cm     ---------  LVOT peak gradient, S                  4     mm Hg  ---------  Stroke volume (SV), LVOT DP            74.4  ml     ---------  Stroke index (SV/bsa), LVOT DP         34.5  ml/m^2 ---------    Aorta                                  Value        Reference  Aortic root ID, ED                     38    mm     ---------  Ascending aorta ID, A-P, S             34    mm     ---------    Left atrium                            Value        Reference  LA ID, A-P, ES                         39    mm     ---------  LA ID/bsa, A-P                         1.81  cm/m^2 <=2.2  LA volume, S                           45    ml     ---------  LA volume/bsa, S                       20.9  ml/m^2 ---------  LA volume, ES, 1-p A4C                 53    ml     ---------  LA volume/bsa, ES, 1-p A4C  24.6  ml/m^2 ---------  LA volume, ES, 1-p A2C                 38    ml     ---------  LA volume/bsa, ES, 1-p A2C             17.6  ml/m^2 ---------    Mitral valve                           Value        Reference  Mitral E-wave peak velocity            84.5  cm/s   ---------  Mitral A-wave peak velocity            90.5  cm/s   ---------  Mitral deceleration time               222   ms     150 - 230  Mitral peak gradient, D                3     mm Hg  ---------  Mitral E/A ratio, peak                 0.9          ---------    Right ventricle                        Value        Reference  RV s&', lateral, S                       17.5  cm/s   ---------  Legend: (L)  and  (H)  mark values outside specified reference range.  ------------------------------------------------------------------- Prepared and Electronically Authenticated by  Pierre Bali, MD 2017-11-08T17:53:18   Transesophageal Echocardiography  Patient:    Jaeshaun, Riva MR #:       [id number] Study Date: 01/11/2016 Gender:     M Age:        70 Height:     174 cm Weight:     95.3 kg BSA:        2.18 m^2 Pt. Status: Room:   ADMITTING    Fransico Him, MD  SONOGRAPHER  Florentina Jenny, RDCS  ATTENDING    Lyman Bishop MD  [place] MD  PERFORMING   Lyman Bishop MD  REFERRING    Lyman Bishop MD  cc:  ------------------------------------------------------------------- LV EF: 55% -   60%  ------------------------------------------------------------------- Indications:      424.0 Mitral valve disease.  ------------------------------------------------------------------- History:   Risk factors:  Hypertension. Diabetes mellitus. Dyslipidemia.  ------------------------------------------------------------------- Study Conclusions  - Left ventricle: The cavity size was normal. There was mild   concentric hypertrophy. Systolic function was normal. The   estimated ejection fraction was in the range of 55% to 60%. Wall   motion was normal; there were no regional wall motion   abnormalities. - Aortic valve: No evidence of vegetation. - Mitral valve: Mid to late systolic prolapse of the P2 segement of   the mitral valve with a flail cord that prolapses into the left   atrium. There is severe regurgitation and reversal of flow was   noted in the LUPV. Regurgitant VTI: 159 cm. Effective regurgitant   orifice (PISA): 0.5 cm^2. Regurgitant volume (PISA): 80 ml. - Left  atrium: The atrium was dilated. No evidence of thrombus in   the atrial cavity or appendage. - Pulmonary veins: Systolic flow reversal in  the LUPV. - Right atrium: No evidence of thrombus in the atrial cavity or   appendage. - Atrial septum: There was a small patent foramen ovale with right   to left flow after valsalva. The interatrial septum is   hypermobile, but not quite aneurysmal.  Impressions:  - Severe MR associated with a flail P2 segment of the mitral valve.   Small PFO noted with valsalva.  ------------------------------------------------------------------- Study data:   Study status:  Routine.  Consent:  The risks, benefits, and alternatives to the procedure were explained to the patient and informed consent was obtained.  Procedure:  Initial setup. The patient was brought to the laboratory. Surface ECG leads were monitored. Sedation. Conscious sedation was administered by cardiology staff. Transesophageal echocardiography. Topical anesthesia was obtained using viscous lidocaine. A transesophageal probe was inserted by the attending cardiologist. Image quality was adequate.  Study completion:  The patient tolerated the procedure well. There were no complications.  Administered medications: Midazolam, 67m, IV.  Fentanyl, 586m, IV.          Diagnostic transesophageal echocardiography.  2D and color Doppler. Birthdate:  Patient birthdate: [birth year] Age:  Patient is [age]r old.  Sex:  Gender: male.    BMI: 31.5 kg/m^2.  Blood pressure: 130/73  Patient status:  Outpatient.  Study date:  Study date: 01/11/2016. Study time: 09:44 AM.  Location:  Endoscopy.  -------------------------------------------------------------------  ------------------------------------------------------------------- Left ventricle:  The cavity size was normal. There was mild concentric hypertrophy. Systolic function was normal. The estimated ejection fraction was in the range of 55% to 60%. Wall motion was normal; there were no regional wall motion  abnormalities.  ------------------------------------------------------------------- Aortic valve:   Structurally normal valve. Trileaflet. Cusp separation was normal.  No evidence of vegetation.  Doppler:  There was no regurgitation.  ------------------------------------------------------------------- Aorta:  The aorta was normal, not dilated, and non-diseased.  ------------------------------------------------------------------- Mitral valve:  Mid to late systolic prolapse of the P2 segement of the mitral valve with a flail cord that prolapses into the left atrium. There is severe regurgitation and reversal of flow was noted in the LUPV.  Doppler:     Peak gradient (D): 2 mm Hg.  ------------------------------------------------------------------- Left atrium:  The atrium was dilated.  No evidence of thrombus in the atrial cavity or appendage.  ------------------------------------------------------------------- Atrial septum:  There was a small patent foramen ovale with right to left flow after valsalva. The interatrial septum is hypermobile, but not quite aneurysmal.  ------------------------------------------------------------------- Pulmonary veins:  Systolic flow reversal in the LUPV.  ------------------------------------------------------------------- Right ventricle:  The cavity size was normal. Wall thickness was normal. Systolic function was normal.  ------------------------------------------------------------------- Pulmonic valve:    Doppler:  There was trivial regurgitation.  ------------------------------------------------------------------- Tricuspid valve:   Doppler:  There was trivial regurgitation.  ------------------------------------------------------------------- Pulmonary artery:   The main pulmonary artery was normal-sized.  ------------------------------------------------------------------- Right atrium:  The atrium was normal in size.  No  evidence of thrombus in the atrial cavity or appendage.  ------------------------------------------------------------------- Pericardium:  There was no pericardial effusion.   ------------------------------------------------------------------- Post procedure conclusions Ascending Aorta:  - The aorta was normal, not dilated, and non-diseased.  ------------------------------------------------------------------- Measurements   Aorta                                Value  Reference  Aortic arch ID, innominate-LCCA      28.16 mm    20 - 36    Mitral valve                         Value       Reference  Mitral E-wave peak velocity          74    cm/s  ---------  Mitral A-wave peak velocity          49.5  cm/s  ---------  Mitral peak gradient, D              2     mm Hg ---------  Mitral E/A ratio, peak               1.5         ---------  Mitral regurg VTI, PISA              159   cm    ---------  Mitral ERO, PISA                     0.5   cm^2  ---------  Mitral regurg volume, PISA           80    ml    ---------  Legend: (L)  and  (H)  mark values outside specified reference range.  ------------------------------------------------------------------- Prepared and Electronically Authenticated by  Zoila Shutter MD 2017-11-28T17:00:37   Impression:  Patient has mitral valve prolapse with stage D severe symptomatic primary mitral regurgitation.  I have personally reviewed the patient's recent transthoracic and transesophageal echocardiograms. 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 Left ventricular size and systolic function remained normal. The left atrium is dilated. The patient appears to be otherwise healthy although he has recently had problems with kidney stones requiring cystoscopy for  extraction.  He has not seen a dentist in many years.   Plan:  The patient and his wife were counseled at length regarding the indications, risks and potential benefits of mitral valve repair.  The rationale for elective surgery has been explained, including a comparison between surgery and continued medical therapy with close follow-up.  The likelihood of successful and durable valve repair has been discussed with particular reference to the findings of their recent echocardiogram.  Based upon these findings and previous experience, I have quoted them a greater than 95 percent likelihood of successful valve repair. The patient understands and accepts all potential risks of surgery including but not limited to risk of death, stroke or other neurologic complication, myocardial infarction, congestive heart failure, respiratory failure, renal failure, bleeding requiring transfusion and/or reexploration, arrhythmia, infection or other wound complications, pneumonia, pleural and/or pericardial effusion, pulmonary embolus, aortic dissection or other major vascular complication, or delayed complications related to valve repair or replacement including but not limited to structural valve deterioration and failure, thrombosis, embolization, endocarditis, or paravalvular leak.  Alternative surgical approaches have been discussed including a comparison between conventional sternotomy and minimally-invasive techniques.  The relative risks and benefits of each have been reviewed as they pertain to the patient's specific circumstances, and all of their questions have been addressed.  Specific risks potentially related to the minimally-invasive approach were discussed at length, including but not limited to risk of conversion to full or partial  sternotomy, aortic dissection or other major vascular complication, unilateral acute lung injury or pulmonary edema, phrenic nerve dysfunction or paralysis, rib fracture, chronic pain,  lung hernia, or lymphocele. All of their questions have been answered.  The patient hopes to proceed with surgery in the near future.  As a next step the patient will need to keep his follow-up appointment with Dr. Diona Fanti in urology to make sure that there is no sign of persistent or recurrent infection or retained kidney stone. He needs dental consultation for cleaning and routine exam. He will need to undergo diagnostic cardiac catheterization. Finally, we will plan CT angiography of the aorta and iliac vessels to evaluate the feasibility of peripheral arterial cannulation for surgery. We tentatively plan to proceed with surgery on 06/21/2016.  The patient will return to our office for follow-up on 06/19/2016.    I spent in excess of 90 minutes during the conduct of this office consultation and >50% of this time involved direct face-to-face encounter with the patient for counseling and/or coordination of their care.    Valentina Gu. Roxy Manns, MD 05/29/2016 1:09 PM

## 2016-05-29 NOTE — Patient Instructions (Signed)
Continue all previous medications without any changes at this time  

## 2016-06-01 ENCOUNTER — Encounter: Payer: Self-pay | Admitting: Hematology and Oncology

## 2016-06-01 ENCOUNTER — Telehealth: Payer: Self-pay | Admitting: Hematology and Oncology

## 2016-06-01 ENCOUNTER — Ambulatory Visit (HOSPITAL_BASED_OUTPATIENT_CLINIC_OR_DEPARTMENT_OTHER): Payer: 59 | Admitting: Hematology and Oncology

## 2016-06-01 ENCOUNTER — Other Ambulatory Visit (HOSPITAL_BASED_OUTPATIENT_CLINIC_OR_DEPARTMENT_OTHER): Payer: 59

## 2016-06-01 DIAGNOSIS — E669 Obesity, unspecified: Secondary | ICD-10-CM

## 2016-06-01 DIAGNOSIS — I340 Nonrheumatic mitral (valve) insufficiency: Secondary | ICD-10-CM

## 2016-06-01 DIAGNOSIS — C9201 Acute myeloblastic leukemia, in remission: Secondary | ICD-10-CM

## 2016-06-01 LAB — CBC WITH DIFFERENTIAL/PLATELET
BASO%: 0.6 % (ref 0.0–2.0)
Basophils Absolute: 0 10*3/uL (ref 0.0–0.1)
EOS ABS: 0 10*3/uL (ref 0.0–0.5)
EOS%: 0.5 % (ref 0.0–7.0)
HCT: 43.3 % (ref 38.4–49.9)
HGB: 14.9 g/dL (ref 13.0–17.1)
LYMPH%: 19.3 % (ref 14.0–49.0)
MCH: 32.4 pg (ref 27.2–33.4)
MCHC: 34.4 g/dL (ref 32.0–36.0)
MCV: 94.4 fL (ref 79.3–98.0)
MONO#: 0.4 10*3/uL (ref 0.1–0.9)
MONO%: 7.1 % (ref 0.0–14.0)
NEUT%: 72.5 % (ref 39.0–75.0)
NEUTROS ABS: 4.3 10*3/uL (ref 1.5–6.5)
PLATELETS: 149 10*3/uL (ref 140–400)
RBC: 4.59 10*6/uL (ref 4.20–5.82)
RDW: 15.4 % — AB (ref 11.0–14.6)
WBC: 5.9 10*3/uL (ref 4.0–10.3)
lymph#: 1.1 10*3/uL (ref 0.9–3.3)

## 2016-06-01 NOTE — Progress Notes (Signed)
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, NPM1 positive      09/17/2013 -  Chemotherapy    He was given induction chemotherapy at [facility] (7+3)      10/01/2013 -  Chemotherapy    He had repeat induction chemotherapy due to persistent disease (5+2)      11/06/2013 Bone Marrow Biopsy    Bone marrow biopsy confirmed remission      11/13/2013 - 11/17/2013 Chemotherapy    He received cycle 1 of consolidation chemotherapy with [facility]      12/18/2013 - 12/22/2013 Chemotherapy    He received cycle 2 of consolidation chemotherapy with [facility]      01/23/2014 - 01/28/2014 Chemotherapy    he received cycle 3 of consolidation chemotherapy with HiDAC      03/05/2014 - 03/10/2014 Hospital Admission    he received cycle 4 of consolidation chemotherapy with Hi[facility]       INTERVAL HISTORY: Please see below for problem oriented charting. He returns for further follow-up He was recently found to have kidney stone and had surgery for that He also is undergoing evaluation for valvular surgery soon He denies recent infection He has lost some weight as discussed from previous visit due to lifestyle modification and dietary change He has some occasional dizziness and lightheadedness related to valvular dysfunction  REVIEW OF SYSTEMS:   Constitutional: Denies fevers, chills or abnormal weight loss Eyes: Denies blurriness of vision Ears, nose, mouth, throat, and face: Denies mucositis or sore throat Respiratory: Denies cough, dyspnea or wheezes Gastrointestinal:  Denies nausea, heartburn or change in bowel habits Skin: Denies abnormal skin rashes Lymphatics: Denies new lymphadenopathy or easy bruising Neurological:Denies numbness, tingling or new  weaknesses Behavioral/Psych: Mood is stable, no new changes  All other systems were reviewed with the patient and are negative.  I have reviewed the past medical history, past surgical history, social history and family history with the patient and they are unchanged from previous note.  ALLERGIES:  is allergic to morphine and related and prochlorperazine.  MEDICATIONS:  Current Outpatient Prescriptions  Medication Sig Dispense Refill  . aspirin 81 MG tablet Take 81 mg by mouth daily.    Marland Kitchen atenolol (TENORMIN) 25 MG tablet Take 0.5 tablets (12.5 mg total) by mouth daily. (Patient taking differently: Take 12.5 mg by mouth every morning. ) 45 tablet 3  . Insulin Glargine (BASAGLAR KWIKPEN) 100 UNIT/ML SOPN INJECT 30 UNITS INTO THE SKIN AT BEDTIME. 30 mL 0  . metFORMIN (GLUCOPHAGE-XR) 500 MG 24 hr tablet Take 1 tablet (500 mg total) by mouth daily with supper. 60 tablet 3  . oxyCODONE-acetaminophen (PERCOCET/ROXICET) 5-325 MG tablet     . simvastatin (ZOCOR) 20 MG tablet Take 1 tablet (20 mg total) by mouth at bedtime. 90 tablet 3  . ondansetron (ZOFRAN) 4 MG tablet     . oxybutynin (DITROPAN) 5 MG tablet      No current facility-administered medications for this visit.    Facility-Administered Medications Ordered in Other Visits  Medication Dose Route Frequency Provider Last Rate Last Dose  . heparin lock flush 100 unit/mL  500 Units Intravenous Once Brunetta Genera, MD      . sodium chloride 0.9 % injection 10 mL  10 mL Intravenous  PRN Brunetta Genera, MD        PHYSICAL EXAMINATION: ECOG PERFORMANCE STATUS: 1 - Symptomatic but completely ambulatory  Vitals:   06/01/16 1419  BP: 137/80  Pulse: 81  Resp: 16  Temp: 98.2 F (36.8 C)   Filed Weights   06/01/16 1419  Weight: 202 lb (91.6 kg)    GENERAL:alert, no distress and comfortable SKIN: skin color, texture, turgor are normal, no rashes or significant lesions EYES: normal, Conjunctiva are pink and non-injected,  sclera clear OROPHARYNX:no exudate, no erythema and lips, buccal mucosa, and tongue normal  NECK: supple, thyroid normal size, non-tender, without nodularity LYMPH:  no palpable lymphadenopathy in the cervical, axillary or inguinal LUNGS: clear to auscultation and percussion with normal breathing effort HEART: regular rate & rhythm with left heart murmurs and no lower extremity edema ABDOMEN:abdomen soft, non-tender and normal bowel sounds Musculoskeletal:no cyanosis of digits and no clubbing  NEURO: alert & oriented x 3 with fluent speech, no focal motor/sensory deficits  LABORATORY DATA:  I have reviewed the data as listed    Component Value Date/Time   NA 139 04/20/2016 1326   NA 138 12/02/2015 1335   K 4.2 04/20/2016 1326   K 4.1 12/02/2015 1335   CL 102 04/20/2016 1326   CO2 29 01/03/2016 1056   CO2 24 12/02/2015 1335   GLUCOSE 132 (H) 04/20/2016 1326   GLUCOSE 252 (H) 12/02/2015 1335   BUN 22 (H) 04/20/2016 1326   BUN 14.2 12/02/2015 1335   CREATININE 1.40 (H) 04/20/2016 1326   CREATININE 0.90 01/03/2016 1056   CREATININE 1.0 12/02/2015 1335   CALCIUM 9.2 01/03/2016 1056   CALCIUM 9.0 12/02/2015 1335   PROT 6.6 12/02/2015 1335   ALBUMIN 3.5 12/02/2015 1335   AST 21 12/02/2015 1335   ALT 28 12/02/2015 1335   ALKPHOS 79 12/02/2015 1335   BILITOT 0.61 12/02/2015 1335   GFRNONAA 71.09 01/24/2010 0753   GFRAA 78 01/20/2008 0755    No results found for: SPEP, UPEP  Lab Results  Component Value Date   WBC 5.9 06/01/2016   NEUTROABS 4.3 06/01/2016   HGB 14.9 06/01/2016   HCT 43.3 06/01/2016   MCV 94.4 06/01/2016   PLT 149 06/01/2016      Chemistry      Component Value Date/Time   NA 139 04/20/2016 1326   NA 138 12/02/2015 1335   K 4.2 04/20/2016 1326   K 4.1 12/02/2015 1335   CL 102 04/20/2016 1326   CO2 29 01/03/2016 1056   CO2 24 12/02/2015 1335   BUN 22 (H) 04/20/2016 1326   BUN 14.2 12/02/2015 1335   CREATININE 1.40 (H) 04/20/2016 1326   CREATININE  0.90 01/03/2016 1056   CREATININE 1.0 12/02/2015 1335      Component Value Date/Time   CALCIUM 9.2 01/03/2016 1056   CALCIUM 9.0 12/02/2015 1335   ALKPHOS 79 12/02/2015 1335   AST 21 12/02/2015 1335   ALT 28 12/02/2015 1335   BILITOT 0.61 12/02/2015 1335       ASSESSMENT & PLAN:  AML (acute myeloid leukemia) in remission He is not symptomatic. His blood work confirmed the patient remained in remission Currently, the patient has been in remission for over 2 years I plan to see him back in 6 months   Non-rheumatic mitral regurgitation The patient has planned surgery for valve repair/replacement I would defer to his cardiologist and surgeon for management The patient is cancer free for over 2 years and there is no  contraindication from the hematology standpoint to proceed  Obesity (BMI 30.0-34.9) He is doing well and appears to be highly motivated to lose weight He has lost some weight since last time I saw him I encouraged him to continue with his weight losing effort   No orders of the defined types were placed in this encounter.  All questions were answered. The patient knows to call the clinic with any problems, questions or concerns. No barriers to learning was detected. I spent 15 minutes counseling the patient face to face. The total time spent in the appointment was 20 minutes and more than 50% was on counseling and review of test results     Heath Lark, MD 06/01/2016 5:22 PM

## 2016-06-01 NOTE — Telephone Encounter (Signed)
Gave patient AVS and calender per 4/19 los.  

## 2016-06-01 NOTE — Assessment & Plan Note (Signed)
He is doing well and appears to be highly motivated to lose weight He has lost some weight since last time I saw him I encouraged him to continue with his weight losing effort

## 2016-06-01 NOTE — Assessment & Plan Note (Signed)
He is not symptomatic. His blood work confirmed the patient remained in remission Currently, the patient has been in remission for over 2 years I plan to see him back in 6 months

## 2016-06-01 NOTE — Assessment & Plan Note (Signed)
The patient has planned surgery for valve repair/replacement I would defer to his cardiologist and surgeon for management The patient is cancer free for over 2 years and there is no contraindication from the hematology standpoint to proceed

## 2016-06-02 ENCOUNTER — Other Ambulatory Visit (HOSPITAL_COMMUNITY): Payer: Self-pay | Admitting: Dentistry

## 2016-06-02 ENCOUNTER — Ambulatory Visit (HOSPITAL_COMMUNITY): Payer: Self-pay | Admitting: Dentistry

## 2016-06-02 ENCOUNTER — Encounter (HOSPITAL_COMMUNITY): Payer: Self-pay | Admitting: Dentistry

## 2016-06-02 VITALS — BP 123/67 | HR 72 | Temp 98.6°F

## 2016-06-02 DIAGNOSIS — M270 Developmental disorders of jaws: Secondary | ICD-10-CM

## 2016-06-02 DIAGNOSIS — I340 Nonrheumatic mitral (valve) insufficiency: Secondary | ICD-10-CM | POA: Diagnosis not present

## 2016-06-02 DIAGNOSIS — K036 Deposits [accretions] on teeth: Secondary | ICD-10-CM

## 2016-06-02 DIAGNOSIS — K083 Retained dental root: Secondary | ICD-10-CM

## 2016-06-02 DIAGNOSIS — J392 Other diseases of pharynx: Secondary | ICD-10-CM

## 2016-06-02 DIAGNOSIS — Z01818 Encounter for other preprocedural examination: Secondary | ICD-10-CM

## 2016-06-02 DIAGNOSIS — K045 Chronic apical periodontitis: Secondary | ICD-10-CM

## 2016-06-02 DIAGNOSIS — M898X0 Other specified disorders of bone, multiple sites: Secondary | ICD-10-CM

## 2016-06-02 DIAGNOSIS — M264 Malocclusion, unspecified: Secondary | ICD-10-CM

## 2016-06-02 DIAGNOSIS — K030 Excessive attrition of teeth: Secondary | ICD-10-CM

## 2016-06-02 DIAGNOSIS — K08409 Partial loss of teeth, unspecified cause, unspecified class: Secondary | ICD-10-CM

## 2016-06-02 DIAGNOSIS — K06020 Generalized gingival recession, unspecified: Secondary | ICD-10-CM

## 2016-06-02 DIAGNOSIS — Z01810 Encounter for preprocedural cardiovascular examination: Secondary | ICD-10-CM | POA: Diagnosis not present

## 2016-06-02 DIAGNOSIS — K0530 Chronic periodontitis, unspecified: Secondary | ICD-10-CM

## 2016-06-02 DIAGNOSIS — K029 Dental caries, unspecified: Secondary | ICD-10-CM

## 2016-06-02 NOTE — Patient Instructions (Signed)
[facility]    Department of Dental Medicine     DR. KULINSKI      HEART VALVES AND MOUTH CARE:  FACTS:   If you have any infection in your mouth, it can infect your heart valve.  If you heart valve is infected, you will be seriously ill.  Infections in the mouth can be SILENT and do not always cause pain.  Examples of infections in the mouth are gum disease, dental cavities, and abscesses.  Some possible signs of infection are: Bad breath, bleeding gums, or teeth that are sensitive to sweets, hot, and/or cold. There are many other signs as well.  WHAT YOU HAVE TO DO:   Brush your teeth after meals and at bedtime. Spend at least 2 minutes brushing well, especially behind your back teeth and all around your teeth that stand alone. Brush at the gumline also.  Do not go to bed without brushing your teeth and flossing.  If you gums bleed when you brush or floss, do NOT stop brushing or flossing. It usually means that your gums need more attention and better cleaning.   If your Dentist or Dr. Kulinski gave you a prescription mouthwash to use, make sure to use it as directed. If you run out of the medication, get a refill at the pharmacy.   If you were given any other medications or directions by your Dentist, please follow them. If you did not understand the directions or forget what you were told, please call. We will be happy to refresh her memory.  If you need antibiotics before dental procedures, make sure you take them one hour prior to every dental visit as directed.   Get a dental checkup every 4-6 months in order to keep your mouth healthy, or to find and treat any new infection. You will most likely need your teeth cleaned or gums treated at the same time.  If you are not able to come in for your scheduled appointment, call your Dentist as soon as possible to reschedule.  If you have a problem in between dental visits, call your Dentist.  

## 2016-06-02 NOTE — Progress Notes (Signed)
DENTAL CONSULTATION  Date of Consultation:  06/02/2016 Patient Name:   David Carr. Date of Birth:   1945/09/27 Medical Record Number: [id number]  VITALS: BP 123/67 (BP Location: Left Arm)   Pulse 72   Temp 98.6 F (37 C) (Oral)   CHIEF COMPLAINT: Patient was referred by Dr. Roxy Manns for dental consultation.   HPI: Kayven Aldaco. is a 71 year old male recently diagnosed with severe mitral regurgitation. Patient with anticipated mitral valve repair or replacement surgery with Dr. Roxy Manns. Patient is now seen as part of a medically necessary pre-heart valve surgery dental protocol examination to rule out dental infection that may affect the patient's systemic health and anticipated heart valve surgery.  The patient has a history of acute pulpitis symptoms from tooth #29. The patient describes sharp and throbbing pain that lasts for hours at a time. The pain is spontaneous at times. The patient last had tooth pain approximately 2-3 days ago. The pain reached an intensity of " 12 out of 10" by report. The patient currently denies any dental pain today.  The patient also is complaining of some tenderness and swelling in the maxillary anterior area on the buccal aspect. Patient has not seen a dentist for more than 10 years. This was Dr. Mariana Arn in [place], [place]. The patient has a maxillary cast partial denture that was fabricated by a prosthodontist approximately 15-20 years ago. Patient recently had to have a clasp repared approximately 3 years ago. That clasp has broken again since that time.  That prosthodontist was Dr. Alean Rinne. Patient does have a history of dental phobia relating to a "difficult extraction" procedure and " difficulty getting numb".   PROBLEM LIST: Patient Active Problem List   Diagnosis Date Noted  . MITRAL REGURGITATION 07/16/2007    Priority: High  . Non-rheumatic mitral regurgitation   . Zoster 12/02/2015  . Obesity (BMI 30.0-34.9)  09/03/2015  . Pancytopenia, acquired ([place]) 05/28/2015  . Nasal drainage 02/12/2014  . Leukopenia due to antineoplastic chemotherapy ([place]) 01/30/2014  . Other fatigue 01/19/2014  . Chills (without fever) 01/19/2014  . AML (acute myeloid leukemia) in remission ([place]) 11/14/2013  . Thrombocytopenia ([place]) 09/04/2013  . Anemia in neoplastic disease 09/04/2013  . Anemia, unspecified 08/28/2013  . Chest pain 07/10/2013  . Dysplastic nevus of face 08/09/2010  . Overweight 01/19/2009  . VERTIGO, POSITIONAL 03/18/2008  . Type 2 diabetes mellitus with hypoglycemia without coma, with long-term current use of insulin ([place]) 05/31/2007  . Hyperlipidemia 05/31/2007  . Essential hypertension 05/31/2007  . ALLERGIC RHINITIS 05/31/2007  . SEBORRHEIC KERATOSIS 05/31/2007  . SHOULDER PAIN, RIGHT 05/31/2007    PMH: Past Medical History:  Diagnosis Date  . AML (acute myeloid leukemia) in remission [facility]) oncologist-  dr Alvy Bimler-- per last note 10/ 2017 in remission 2 years   dx 09-11-2013  via bone marrow bx , FLT3 negative (NP M1 +)/  chemotherapy started 09-17-2013,  remission via marrow bx 11-06-2013,  4 cycles consolidation chemo w/ HiDAC 11-13-2013 to 03-05-2014  . BPH with urinary obstruction   . Chronic thrombocytopenic purpura ([place])   . Heart murmur   . History of kidney stones   . Hyperlipidemia   . Hypertension   . MVP (mitral valve prolapse)   . Pancytopenia, acquired ([place])   . Right ureteral stone   . Severe mitral regurgitation   . Type 2 diabetes mellitus with hypoglycemia, with long-term current use of insulin [facility])    endocrinologist-  dr Con Memos  .  Wears dentures    upper    PSH: Past Surgical History:  Procedure Laterality Date  . CARDIOVASCULAR STRESS TEST  07/23/2013   Low risk nuclear study w/ mild inferior ischemia/  normal LV function and wall motion, ef 69%  . CYSTOSCOPY WITH RETROGRADE PYELOGRAM, URETEROSCOPY AND STENT PLACEMENT Right 04/20/2016   Procedure: CYSTOSCOPY  WITH RETROGRADE PYELOGRAM, URETEROSCOPY , STONE BASKETRY AND STENT PLACEMENT;  Surgeon: Franchot Gallo, MD;  Location: [facility];  Service: Urology;  Laterality: Right;  . EXTRACORPOREAL SHOCK WAVE LITHOTRIPSY  yrs ago  . HOLMIUM LASER APPLICATION Right 07/21/1243   Procedure: HOLMIUM LASER APPLICATION;  Surgeon: Franchot Gallo, MD;  Location: [facility];  Service: Urology;  Laterality: Right;  . INGUINAL HERNIA REPAIR Left 1990's  . ROTATOR CUFF REPAIR Right x2  last one 2011  . TEE WITHOUT CARDIOVERSION N/A 01/11/2016   Procedure: TRANSESOPHAGEAL ECHOCARDIOGRAM (TEE);  Surgeon: Pixie Casino, MD;  Location: [facility] ENDOSCOPY;  Service: Cardiovascular;  Laterality: N/A; Severe MR associated w/ flail P2 segment of MV;  no LAA thromus;  small PFO by saline microbubble contrast after valsalva; LVEF 55-60%  . TENNIS ELBOW RELEASE/NIRSCHEL PROCEDURE Right 1990's  . TRANSTHORACIC ECHOCARDIOGRAM  12/22/2015   grade 1 diastolic dysfunction,  ef 55-60%/  moderate MVP involving posterior leaflet w/ partial flail and severe MR via CW doppler (peak gradient 46mHg)/  trivial TR    ALLERGIES: Allergies  Allergen Reactions  . Morphine And Related     Shuts bladder down.  .Marland KitchenProchlorperazine Other (See Comments)    Heart arrythmia    MEDICATIONS: Current Outpatient Prescriptions  Medication Sig Dispense Refill  . aspirin 81 MG tablet Take 81 mg by mouth daily.    .Marland Kitchenatenolol (TENORMIN) 25 MG tablet Take 0.5 tablets (12.5 mg total) by mouth daily. (Patient taking differently: Take 12.5 mg by mouth every morning. ) 45 tablet 3  . Insulin Glargine (BASAGLAR KWIKPEN) 100 UNIT/ML SOPN INJECT 30 UNITS INTO THE SKIN AT BEDTIME. 30 mL 0  . metFORMIN (GLUCOPHAGE-XR) 500 MG 24 hr tablet Take 1 tablet (500 mg total) by mouth daily with supper. 60 tablet 3  . oxybutynin (DITROPAN) 5 MG tablet     . oxyCODONE-acetaminophen (PERCOCET/ROXICET) 5-325 MG tablet     . simvastatin  (ZOCOR) 20 MG tablet Take 1 tablet (20 mg total) by mouth at bedtime. 90 tablet 3  . ondansetron (ZOFRAN) 4 MG tablet      No current facility-administered medications for this visit.    Facility-Administered Medications Ordered in Other Visits  Medication Dose Route Frequency Provider Last Rate Last Dose  . heparin lock flush 100 unit/mL  500 Units Intravenous Once GBrunetta Genera MD      . sodium chloride 0.9 % injection 10 mL  10 mL Intravenous PRN GBrunetta Genera MD        LABS: Lab Results  Component Value Date   WBC 5.9 06/01/2016   HGB 14.9 06/01/2016   HCT 43.3 06/01/2016   MCV 94.4 06/01/2016   PLT 149 06/01/2016      Component Value Date/Time   NA 139 04/20/2016 1326   NA 138 12/02/2015 1335   K 4.2 04/20/2016 1326   K 4.1 12/02/2015 1335   CL 102 04/20/2016 1326   CO2 29 01/03/2016 1056   CO2 24 12/02/2015 1335   GLUCOSE 132 (H) 04/20/2016 1326   GLUCOSE 252 (H) 12/02/2015 1335   BUN 22 (H) 04/20/2016 1326  BUN 14.2 12/02/2015 1335   CREATININE 1.40 (H) 04/20/2016 1326   CREATININE 0.90 01/03/2016 1056   CREATININE 1.0 12/02/2015 1335   CALCIUM 9.2 01/03/2016 1056   CALCIUM 9.0 12/02/2015 1335   GFRNONAA 71.09 01/24/2010 0753   GFRAA 78 01/20/2008 0755   Lab Results  Component Value Date   INR 1.0 01/03/2016   INR 1.03 02/09/2015   INR 1.12 09/11/2013   No results found for: PTT  SOCIAL HISTORY: Social History   Social History  . Marital status: Married    Spouse name: N/A  . Number of children: N/A  . Years of education: N/A   Occupational History  . Not on file.   Social History Main Topics  . Smoking status: Former Smoker    Years: 20.00    Types: Cigarettes    Quit date: 04/18/1974  . Smokeless tobacco: Never Used  . Alcohol use No  . Drug use: No  . Sexual activity: Not on file   Other Topics Concern  . Not on file   Social History Narrative  . No narrative on file    FAMILY HISTORY: Family History  Problem  Relation Age of Onset  . Asthma Sister   . Cancer Daughter     carcinoid tumor    REVIEW OF SYSTEMS: Reviewed with the patient as per history of present illness. Psych: Patient has history of dental phobia.  DENTAL HISTORY: CHIEF COMPLAINT: Patient was referred by Dr. Roxy Manns for dental consultation.   HPI: Sorin Frimpong. is a 71 year old male recently diagnosed with severe mitral regurgitation. Patient with anticipated mitral valve repair or replacement surgery with Dr. Roxy Manns. Patient is now seen as part of a medically necessary pre-heart valve surgery dental protocol examination to rule out dental infection that may affect the patient's systemic health and anticipated heart valve surgery.  The patient has a history of acute pulpitis symptoms from tooth #29. The patient describes sharp and throbbing pain that lasts for hours at a time. The pain is spontaneous at times. The patient last had tooth pain approximately 2-3 days ago. The pain reached an intensity of " 12 out of 10" by report. The patient currently denies any dental pain today.  The patient also is complaining of some tenderness and swelling in the maxillary anterior area on the buccal aspect. Patient has not seen a dentist for more than 10 years. This was Dr. Mariana Arn in [place], [place]. The patient has a maxillary cast partial denture that was fabricated by a prosthodontist approximately 15-20 years ago. Patient recently had to have a clasp repared approximately 3 years ago. That clasp has broken again since that time.  That prosthodontist was Dr. Alean Rinne. Patient does have a history of dental phobia relating to a "difficult extraction" procedure and " difficulty getting numb".   DENTAL EXAMINATION: GENERAL: The patient is a well-developed, well-nourished male in no acute distress. HEAD AND NECK: There is no palpable neck lymphadenopathy. The patient denies acute TMJ symptoms. INTRAORAL EXAM: The patient has  normal saliva. The patient has bilateral mandibular lingual tori. The patient has buccal exostoses in the upper right and upper left posteriorr quadrants. DENTITION: The patient has multiple missing teeth. The patient has multiple retained root segments. The patient has excessive attrition. PERIODONTAL: The patient has chronic periodontitis with plaque and calculus accumulations, generalized gingival recession, and incipient to moderate bone loss. DENTAL CARIES/SUBOPTIMAL RESTORATIONS:  The patient has multiple dental caries. ENDODONTIC:  the patient has  a history of acute pulpitis symptoms. The patient has multiple areas of periapical pathology and radiolucency. The patient has had previous root canal therapies. CROWN AND BRIDGE:The patient has a crown on Tooth #19. PROSTHODONTIC: The patient has a maxillary cast partial denture that is ill-fitting.  This was fabricated 15-20 years ago by a Prosthodontist. OCCLUSION: The patient has a poor occlusal scheme secondary to multiple missing teeth, excessive attrition, and lack of replacement of missing teeth with dental prostheses.   RADIOGRAPHIC INTERPRETATION: An orthopantogram was taken and supplemented with 5 PA radiographs. Unable to obtain full series due to gag reflex. There are multiple missing teeth. There are multiple retained root segments. There is evidence of excessive attrition. There are multiple areas of periapical pathology and radiolucency. Multiple dental caries are noted. There is pneumatixzation of the maxillary sinuses.    ASSESSMENTS: 1.Severe mitral regurgitation 2. Pre-heart valve surgery dental protocol 3. History of acute pulpitis 4. Chronic apical periodontitis 5. Dental caries 6. Excessive attrition 7. Chronic periodontitis with bone loss 8. Gingival recession 9. Accretions 10. Multiple missing teeth 11. Supra-eruption and drifting of the unopposed teeth into the edentulous areas 12. Bilateral mandibular lingual  tori 13. Buccal exostosis of the upper right and upper left quadrants 14. Ill fitting maxillary cast partial denture 15. Malocclusion  16. Hyperactive Gag reflex 17. Risk for complications with anticipated invasive dental procedures in the operating room with general anesthesia To and including death due to his overall cardiovascular compromise. 18. Dental phobia  PLAN/RECOMMENDATIONS: 1. I discussed the risks, benefits, and complications of various treatment options with the patient in relationship to his medical and dental conditions, anticipated heart valve surgery and risk for endocarditis.  We discussed various treatment options to include no treatment, multiple extractions with alveoloplasty, pre-prosthetic surgery as indicated, periodontal therapy, dental restorations, root canal therapy, crown and bridge therapy, implant therapy, and replacement of missing teeth as indicated. The patient currently wishes to proceed with  extraction of remaining teeth with alveoloplasty and pre-prosthetic surgery as needed in the operating room with general anesthesia. This has been scheduled for Thursday, 06/08/2016 at 7:30 AM at [facility]. The patient will then follow-up with a dentist of his choice for fabrication of upper and lower complete dentures after adequate healing and once medically stable from the anticipated heart valve surgery. The patient is aware of the significant problems with wearing dentures with his gag reflex and will consider implant therapy in the future if needed.   2. Discussion of findings with medical team and coordination of future medical and dental care as needed.  I spent in excess of  120 minutes during the conduct of this consultation and >50% of this time involved direct face-to-face encounter for counseling and/or coordination of the patient's care.    Lenn Cal, DDS

## 2016-06-04 ENCOUNTER — Other Ambulatory Visit: Payer: Self-pay | Admitting: Cardiovascular Disease

## 2016-06-04 DIAGNOSIS — I340 Nonrheumatic mitral (valve) insufficiency: Secondary | ICD-10-CM

## 2016-06-05 ENCOUNTER — Encounter (HOSPITAL_COMMUNITY): Payer: Self-pay | Admitting: *Deleted

## 2016-06-05 NOTE — Progress Notes (Signed)
Pt denies any acute cardiopulmonary issues. Pt under the care of Dr. Roxy Manns, Cardiology. Pt made aware to stop taking vitamins, fish oil and herbal medications. Do not take any NSAIDs ie: Ibuprofen, Advil, Naproxen BC and Goody Powder. Pt stated that he was instructed to stop Metformin the day before procedure and to only take half ( 15 units) of insulin the night before procedure. Pt made aware to check BG every 2 hours prior to arrival to hospital, take 4 glucose tabs to treat a BG < 70 and wait 15 minutes after taking tabs to recheck BG, if BG still remains < 70 call the SS unit. Please call pt spouse to pick up pt after procedure at [phone] Vaughan Basta). Pt verbalized understanding of all pre-op instructions. Anesthesia made ware of consult.

## 2016-06-07 ENCOUNTER — Ambulatory Visit (HOSPITAL_COMMUNITY)
Admission: RE | Admit: 2016-06-07 | Discharge: 2016-06-07 | Disposition: A | Discharge status: Home / Self Care | Payer: 59 | Source: Ambulatory Visit | Attending: Cardiovascular Disease | Admitting: Cardiovascular Disease

## 2016-06-07 ENCOUNTER — Ambulatory Visit (HOSPITAL_COMMUNITY)
Admission: RE | Disposition: A | Discharge status: Home / Self Care | Payer: Self-pay | Source: Ambulatory Visit | Attending: Cardiovascular Disease

## 2016-06-07 ENCOUNTER — Other Ambulatory Visit: Payer: Self-pay

## 2016-06-07 DIAGNOSIS — I340 Nonrheumatic mitral (valve) insufficiency: Secondary | ICD-10-CM

## 2016-06-07 DIAGNOSIS — Z87442 Personal history of urinary calculi: Secondary | ICD-10-CM | POA: Insufficient documentation

## 2016-06-07 DIAGNOSIS — E785 Hyperlipidemia, unspecified: Secondary | ICD-10-CM | POA: Diagnosis not present

## 2016-06-07 DIAGNOSIS — Z87891 Personal history of nicotine dependence: Secondary | ICD-10-CM | POA: Insufficient documentation

## 2016-06-07 DIAGNOSIS — Z7982 Long term (current) use of aspirin: Secondary | ICD-10-CM | POA: Insufficient documentation

## 2016-06-07 DIAGNOSIS — Z9221 Personal history of antineoplastic chemotherapy: Secondary | ICD-10-CM | POA: Diagnosis not present

## 2016-06-07 DIAGNOSIS — D692 Other nonthrombocytopenic purpura: Secondary | ICD-10-CM | POA: Insufficient documentation

## 2016-06-07 DIAGNOSIS — E11649 Type 2 diabetes mellitus with hypoglycemia without coma: Secondary | ICD-10-CM | POA: Insufficient documentation

## 2016-06-07 DIAGNOSIS — I10 Essential (primary) hypertension: Secondary | ICD-10-CM | POA: Diagnosis not present

## 2016-06-07 DIAGNOSIS — Z794 Long term (current) use of insulin: Secondary | ICD-10-CM | POA: Diagnosis not present

## 2016-06-07 DIAGNOSIS — I2510 Atherosclerotic heart disease of native coronary artery without angina pectoris: Secondary | ICD-10-CM | POA: Insufficient documentation

## 2016-06-07 DIAGNOSIS — Z856 Personal history of leukemia: Secondary | ICD-10-CM | POA: Insufficient documentation

## 2016-06-07 DIAGNOSIS — I511 Rupture of chordae tendineae, not elsewhere classified: Secondary | ICD-10-CM | POA: Diagnosis not present

## 2016-06-07 DIAGNOSIS — Z885 Allergy status to narcotic agent status: Secondary | ICD-10-CM | POA: Insufficient documentation

## 2016-06-07 DIAGNOSIS — D61818 Other pancytopenia: Secondary | ICD-10-CM | POA: Insufficient documentation

## 2016-06-07 DIAGNOSIS — I341 Nonrheumatic mitral (valve) prolapse: Secondary | ICD-10-CM | POA: Insufficient documentation

## 2016-06-07 DIAGNOSIS — N401 Enlarged prostate with lower urinary tract symptoms: Secondary | ICD-10-CM | POA: Insufficient documentation

## 2016-06-07 DIAGNOSIS — N138 Other obstructive and reflux uropathy: Secondary | ICD-10-CM | POA: Insufficient documentation

## 2016-06-07 DIAGNOSIS — Q211 Atrial septal defect: Secondary | ICD-10-CM | POA: Insufficient documentation

## 2016-06-07 DIAGNOSIS — I080 Rheumatic disorders of both mitral and aortic valves: Secondary | ICD-10-CM | POA: Diagnosis present

## 2016-06-07 HISTORY — PX: RIGHT/LEFT HEART CATH AND CORONARY ANGIOGRAPHY: CATH118266

## 2016-06-07 LAB — BASIC METABOLIC PANEL
Anion gap: 7 (ref 5–15)
BUN: 12 mg/dL (ref 6–20)
CALCIUM: 9.2 mg/dL (ref 8.9–10.3)
CO2: 26 mmol/L (ref 22–32)
Chloride: 107 mmol/L (ref 101–111)
Creatinine, Ser: 0.93 mg/dL (ref 0.61–1.24)
Glucose, Bld: 160 mg/dL — ABNORMAL HIGH (ref 65–99)
POTASSIUM: 4.1 mmol/L (ref 3.5–5.1)
Sodium: 140 mmol/L (ref 135–145)

## 2016-06-07 LAB — POCT I-STAT 3, VENOUS BLOOD GAS (G3P V)
Acid-Base Excess: 1 mmol/L (ref 0.0–2.0)
Bicarbonate: 25 mmol/L (ref 20.0–28.0)
Bicarbonate: 26.2 mmol/L (ref 20.0–28.0)
O2 SAT: 60 %
O2 SAT: 94 %
PCO2 VEN: 40.4 mmHg — AB (ref 44.0–60.0)
PCO2 VEN: 41.7 mmHg — AB (ref 44.0–60.0)
PO2 VEN: 73 mmHg — AB (ref 32.0–45.0)
TCO2: 26 mmol/L (ref 0–100)
TCO2: 27 mmol/L (ref 0–100)
pH, Ven: 7.399 (ref 7.250–7.430)
pH, Ven: 7.407 (ref 7.250–7.430)
pO2, Ven: 31 mmHg — CL (ref 32.0–45.0)

## 2016-06-07 LAB — GLUCOSE, CAPILLARY
GLUCOSE-CAPILLARY: 97 mg/dL (ref 65–99)
Glucose-Capillary: 152 mg/dL — ABNORMAL HIGH (ref 65–99)

## 2016-06-07 LAB — PROTIME-INR
INR: 0.99
Prothrombin Time: 13.1 seconds (ref 11.4–15.2)

## 2016-06-07 SURGERY — RIGHT/LEFT HEART CATH AND CORONARY ANGIOGRAPHY
Anesthesia: LOCAL

## 2016-06-07 MED ORDER — LIDOCAINE HCL (PF) 1 % IJ SOLN
INTRAMUSCULAR | Status: AC
  Filled 2016-06-07: qty 30

## 2016-06-07 MED ORDER — HEPARIN (PORCINE) IN NACL 2-0.9 UNIT/ML-% IJ SOLN
INTRAMUSCULAR | Status: DC | PRN
  Administered 2016-06-07: 1000 mL via INTRA_ARTERIAL

## 2016-06-07 MED ORDER — ONDANSETRON HCL 4 MG/2ML IJ SOLN: 4.0000 mg | Freq: Four times a day (QID) | INTRAMUSCULAR | Status: DC | PRN

## 2016-06-07 MED ORDER — IOPAMIDOL (ISOVUE-370) INJECTION 76%
INTRAVENOUS | Status: DC | PRN
  Administered 2016-06-07: 40 mL via INTRA_ARTERIAL

## 2016-06-07 MED ORDER — MIDAZOLAM HCL 2 MG/2ML IJ SOLN
INTRAMUSCULAR | Status: DC | PRN
  Administered 2016-06-07: 2 mg via INTRAVENOUS

## 2016-06-07 MED ORDER — HEPARIN SODIUM (PORCINE) 1000 UNIT/ML IJ SOLN
INTRAMUSCULAR | Status: DC | PRN
  Administered 2016-06-07: 5000 [IU] via INTRAVENOUS

## 2016-06-07 MED ORDER — MIDAZOLAM HCL 2 MG/2ML IJ SOLN
INTRAMUSCULAR | Status: AC
  Filled 2016-06-07: qty 2

## 2016-06-07 MED ORDER — ACETAMINOPHEN 325 MG PO TABS: 650.0000 mg | ORAL_TABLET | ORAL | Status: DC | PRN

## 2016-06-07 MED ORDER — HEPARIN SODIUM (PORCINE) 1000 UNIT/ML IJ SOLN
INTRAMUSCULAR | Status: AC
  Filled 2016-06-07: qty 1

## 2016-06-07 MED ORDER — SODIUM CHLORIDE 0.9% FLUSH: 3.0000 mL | INTRAVENOUS | Status: DC | PRN

## 2016-06-07 MED ORDER — SODIUM CHLORIDE 0.9 % WEIGHT BASED INFUSION: 1.0000 mL/kg/h | INTRAVENOUS | Status: DC

## 2016-06-07 MED ORDER — ASPIRIN 81 MG PO CHEW: 81.0000 mg | CHEWABLE_TABLET | ORAL | Status: DC

## 2016-06-07 MED ORDER — SODIUM CHLORIDE 0.9% FLUSH: 3.0000 mL | Freq: Two times a day (BID) | INTRAVENOUS | Status: DC

## 2016-06-07 MED ORDER — CEFAZOLIN SODIUM-DEXTROSE 2-4 GM/100ML-% IV SOLN
2.0000 g | INTRAVENOUS | Status: AC
  Administered 2016-06-08: 2 g via INTRAVENOUS
  Filled 2016-06-07: qty 100

## 2016-06-07 MED ORDER — SODIUM CHLORIDE 0.9 % WEIGHT BASED INFUSION
3.0000 mL/kg/h | INTRAVENOUS | Status: DC
  Administered 2016-06-07: 3 mL/kg/h via INTRAVENOUS

## 2016-06-07 MED ORDER — HEPARIN (PORCINE) IN NACL 2-0.9 UNIT/ML-% IJ SOLN
INTRAMUSCULAR | Status: AC
  Filled 2016-06-07: qty 1000

## 2016-06-07 MED ORDER — SODIUM CHLORIDE 0.9 % IV SOLN: 250.0000 mL | INTRAVENOUS | Status: DC | PRN

## 2016-06-07 MED ORDER — VERAPAMIL HCL 2.5 MG/ML IV SOLN
INTRAVENOUS | Status: DC | PRN
  Administered 2016-06-07: 13:00:00 via INTRA_ARTERIAL

## 2016-06-07 MED ORDER — FENTANYL CITRATE (PF) 100 MCG/2ML IJ SOLN
INTRAMUSCULAR | Status: AC
  Filled 2016-06-07: qty 2

## 2016-06-07 MED ORDER — LIDOCAINE HCL (PF) 1 % IJ SOLN
INTRAMUSCULAR | Status: DC | PRN
  Administered 2016-06-07: 2 mL

## 2016-06-07 SURGICAL SUPPLY — 15 items
CATH BALLN WEDGE 5F 110CM (CATHETERS) ×1 IMPLANT
CATH INFINITI 5 FR JL3.5 (CATHETERS) ×1 IMPLANT
CATH INFINITI JR4 5F (CATHETERS) ×1 IMPLANT
DEVICE RAD COMP TR BAND LRG (VASCULAR PRODUCTS) ×1 IMPLANT
GLIDESHEATH SLEND SS 6F .021 (SHEATH) ×1 IMPLANT
GUIDEWIRE .025 260CM (WIRE) ×1 IMPLANT
GUIDEWIRE INQWIRE 1.5J.035X260 (WIRE) IMPLANT
INQWIRE 1.5J .035X260CM (WIRE) ×2
KIT HEART LEFT (KITS) ×2 IMPLANT
PACK CARDIAC CATHETERIZATION (CUSTOM PROCEDURE TRAY) ×2 IMPLANT
SHEATH GLIDE SLENDER 4/5FR (SHEATH) ×1 IMPLANT
SYR MEDRAD MARK V 150ML (SYRINGE) ×2 IMPLANT
TRANSDUCER W/STOPCOCK (MISCELLANEOUS) ×2 IMPLANT
TUBING CIL FLEX 10 FLL-RA (TUBING) ×2 IMPLANT
WIRE MICROINTRODUCER 60CM (WIRE) ×2 IMPLANT

## 2016-06-07 NOTE — H&P (View-Only) (Signed)
[address]       [place] Carr,[place] Carr [address]             [phone]     CARDIOTHORACIC SURGERY CONSULTATION REPORT  Referring Provider is Minus Breeding, MD PCP is Joycelyn Man, MD  Chief Complaint  Patient presents with  . Mitral Regurgitation    Surgical eval, TEE 01/11/16     HPI:  Patient is a 71 year old male with history of mitral valve prolapse with mitral regurgitation, hypertension, type II diabetes, acute myeloid leukemia in remission, hyperlipidemia, and kidney stones who has been referred for surgical consultation to discuss management options for mitral valve prolapse with severe primary mitral regurgitation. The patient states that he was first noted to have a heart murmur on physical exam performed by his primary care physician many years ago. He was referred to Dr. Percival Spanish and has undergone transthoracic echocardiogram regularly in follow-up.   In 2015 he developed AML and underwent chemotherapy at [facility].  He recovered from this uneventfully and has remained in remission since. Last fall he underwent routine follow-up echocardiogram which revealed what appeared to be significant progression of severity of mitral regurgitation. Transesophageal echocardiogram was performed 01/11/2016 and confirmed the presence of a flail segment involving the middle portion of the posterior leaflet with ruptured chordae tendineae and severe mitral regurgitation. Left ventricular systolic function remained normal with ejection fraction estimated 55-60%. There was left atrial chamber enlargement. There was a small patent foramen ovale. He was referred for elective surgical consultation. In addition, the patient has recently had problems with kidney stones. He passed 2 of 3 stones on his own but recently had a third stone become lodged, requiring cystoscopy for extraction.    Patient is married and lives locally in [place] with his wife. He is retired  having previously worked as an Optometrist. He has remained fairly active physically during retirement although he states that his bout with AML and chemotherapy slowed down quite a bit several years ago. He still enjoys essentially normal physical activity without any significant limitations. He just recently underwent cystoscopy for extraction of an impacted kidney stone. Subsequent to this he had problems with severe constipation in the setting of prolonged oral antibiotics. All of this seems to be resolving. He states that he has began to experience mild symptoms of exertional shortness of breath. This occurs only with more strenuous physical exertion such as going up a flight of stairs or walking up a steep hill. Breathing is comfortable with ordinary activity. He has not had any PND, orthopnea, or lower extremity edema. He denies any history of chest tightness or chest pressure. He has had some dizzy spells without syncope. He denies any history of palpitations.  Past Medical History:  Diagnosis Date  . AML (acute myeloid leukemia) in remission [facility]) oncologist-  dr Alvy Bimler-- per last note 10/ 2017 in remission 2 years   dx 09-11-2013  via bone marrow bx , FLT3 negative (NP M1 +)/  chemotherapy started 09-17-2013,  remission via marrow bx 11-06-2013,  4 cycles consolidation chemo w/ HiDAC 11-13-2013 to 03-05-2014  . BPH with urinary obstruction   . Chronic thrombocytopenic purpura ([place])   . Heart murmur   . History of kidney stones   . Hyperlipidemia   . Hypertension   . MVP (mitral valve prolapse)   . Pancytopenia, acquired ([place])   . Right ureteral stone   . Severe mitral regurgitation   . Type 2  diabetes mellitus with hypoglycemia, with long-term current use of insulin [facility])    endocrinologist-  dr Con Memos  . Wears dentures    upper    Past Surgical History:  Procedure Laterality Date  . CARDIOVASCULAR STRESS TEST  07/23/2013   Low risk nuclear study w/ mild inferior ischemia/  normal  LV function and wall motion, ef 69%  . CYSTOSCOPY WITH RETROGRADE PYELOGRAM, URETEROSCOPY AND STENT PLACEMENT Right 04/20/2016   Procedure: CYSTOSCOPY WITH RETROGRADE PYELOGRAM, URETEROSCOPY , STONE BASKETRY AND STENT PLACEMENT;  Surgeon: Franchot Gallo, MD;  Location: [facility];  Service: Urology;  Laterality: Right;  . EXTRACORPOREAL SHOCK WAVE LITHOTRIPSY  yrs ago  . HOLMIUM LASER APPLICATION Right 10/16/2353   Procedure: HOLMIUM LASER APPLICATION;  Surgeon: Franchot Gallo, MD;  Location: [facility];  Service: Urology;  Laterality: Right;  . INGUINAL HERNIA REPAIR Left 1990's  . ROTATOR CUFF REPAIR Right x2  last one 2011  . TEE WITHOUT CARDIOVERSION N/A 01/11/2016   Procedure: TRANSESOPHAGEAL ECHOCARDIOGRAM (TEE);  Surgeon: Pixie Casino, MD;  Location: [facility] ENDOSCOPY;  Service: Cardiovascular;  Laterality: N/A; Severe MR associated w/ flail P2 segment of MV;  no LAA thromus;  small PFO by saline microbubble contrast after valsalva; LVEF 55-60%  . TENNIS ELBOW RELEASE/NIRSCHEL PROCEDURE Right 1990's  . TRANSTHORACIC ECHOCARDIOGRAM  12/22/2015   grade 1 diastolic dysfunction,  ef 55-60%/  moderate MVP involving posterior leaflet w/ partial flail and severe MR via CW doppler (peak gradient 33mHg)/  trivial TR    Family History  Problem Relation Age of Onset  . Asthma Sister   . Cancer Daughter     carcinoid tumor    Social History   Social History  . Marital status: Married    Spouse name: N/A  . Number of children: N/A  . Years of education: N/A   Occupational History  . Not on file.   Social History Main Topics  . Smoking status: Former Smoker    Years: 20.00    Types: Cigarettes    Quit date: 04/18/1974  . Smokeless tobacco: Never Used  . Alcohol use No  . Drug use: No  . Sexual activity: Not on file   Other Topics Concern  . Not on file   Social History Narrative  . No narrative on file    Current Outpatient Prescriptions    Medication Sig Dispense Refill  . aspirin 81 MG tablet Take 81 mg by mouth daily.    .Marland Kitchenatenolol (TENORMIN) 25 MG tablet Take 0.5 tablets (12.5 mg total) by mouth daily. (Patient taking differently: Take 12.5 mg by mouth every morning. ) 45 tablet 3  . Insulin Glargine (BASAGLAR KWIKPEN) 100 UNIT/ML SOPN Inject 30 units into the skin at bedtime. 10 pen 0  . metFORMIN (GLUCOPHAGE-XR) 500 MG 24 hr tablet Take 1 tablet (500 mg total) by mouth daily with supper. 60 tablet 3  . simvastatin (ZOCOR) 20 MG tablet Take 1 tablet (20 mg total) by mouth at bedtime. 90 tablet 3   No current facility-administered medications for this visit.    Facility-Administered Medications Ordered in Other Visits  Medication Dose Route Frequency Provider Last Rate Last Dose  . heparin lock flush 100 unit/mL  500 Units Intravenous Once GBrunetta Genera MD      . sodium chloride 0.9 % injection 10 mL  10 mL Intravenous PRN Gautam KJuleen China MD        Allergies  Allergen Reactions  . Morphine And  Related     Shuts bladder down.  Marland Kitchen Prochlorperazine Other (See Comments)    Heart arrythmia      Review of Systems:   General:  normal appetite, normal energy, no weight gain, no weight loss, no fever  Cardiac:  no chest pain with exertion, no chest pain at rest, + SOB with more strenuous exertion, no resting SOB, no PND, no orthopnea, no palpitations, no arrhythmia, no atrial fibrillation, no LE edema, + dizzy spells, no syncope  Respiratory:  + mild exertional shortness of breath, no home oxygen, no productive cough, no dry cough, no bronchitis, no wheezing, no hemoptysis, + cold weather induced asthma, no pain with inspiration or cough, no sleep apnea, no CPAP at night  GI:   no difficulty swallowing, + reflux, NO frequent heartburn, + hiatal hernia, no abdominal pain, + constipation, no diarrhea, no hematochezia, no hematemesis, no melena  GU:   no dysuria,  + frequency, no urinary tract infection, + hematuria,  no enlarged prostate, + kidney stones, no kidney disease  Vascular:  no pain suggestive of claudication, no pain in feet, no leg cramps, no varicose veins, no DVT, no non-healing foot ulcer  Neuro:   no stroke, no TIA's, no seizures, no headaches, no temporary blindness one eye,  no slurred speech, no peripheral neuropathy, no chronic pain, no instability of gait, no memory/cognitive dysfunction  Musculoskeletal: no arthritis, no joint swelling, no myalgias, no difficulty walking, normal mobility   Skin:   no rash, no itching, no skin infections, no pressure sores or ulcerations  Psych:   no anxiety, no depression, no nervousness, no unusual recent stress  Eyes:   no blurry vision, no floaters, no recent vision changes, + wears glasses only for reading  ENT:   no hearing loss, no loose or painful teeth, partial upper dentures, last saw dentist many years ago  Hematologic:  no easy bruising, no abnormal bleeding, no clotting disorder, no frequent epistaxis  Endocrine:  + diabetes, does [place] CBG's at home     Physical Exam:   BP 120/74   Pulse 68   Resp 20   Ht _0  (1.727 m)   Wt 200 lb (90.7 kg)   SpO2 97% Comment: RA  BMI 30.41 kg/m   General:    well-appearing  HEENT:  Unremarkable   Neck:   no JVD, no bruits, no adenopathy   Chest:   clear to auscultation, symmetrical breath sounds, no wheezes, no rhonchi   CV:   RRR, grade III/VI holosystolic murmur   Abdomen:  soft, non-tender, no masses   Extremities:  warm, well-perfused, pulses palpable, no LE edema  Rectal/GU  Deferred  Neuro:   Grossly non-focal and symmetrical throughout  Skin:   Clean and dry, no rashes, no breakdown   Diagnostic Tests:  Echocardiography  Patient:    David Carr, David Carr MR #:       [id number] Study Date: 12/22/2015 Gender:     M Age:        70 Height:     169.5 cm Weight:     95.5 kg BSA:        2.16 m^2 Pt. Status: Room:   ATTENDING    Minus Breeding, MD  ORDERING     Minus Breeding,  MD  REFERRING    Minus Breeding, MD  SONOGRAPHER  Cindy Hazy, RDCS  PERFORMING   [facility]  cc:  ------------------------------------------------------------------- LV EF: 55% -   60%  ------------------------------------------------------------------- Indications:  I34.0 Mitral Regurgitation.  ------------------------------------------------------------------- History:   PMH:  Acquired from the patient and from the patient&'s chart.  PMH:  History of Acute Myeloid Leukemia. Mitral regurgitation.  Risk factors:  Hypertension. Diabetes mellitus. Dyslipidemia.  ------------------------------------------------------------------- Study Conclusions  - Left ventricle: The cavity size was normal. Systolic function was   normal. The estimated ejection fraction was in the range of 55%   to 60%. Doppler parameters are consistent with abnormal left   ventricular relaxation (grade 1 diastolic dysfunction). - Mitral valve: Moderate prolapse, involving the posterior leaflet.   Probable flail motion involving the posterior leaflet. There was   mild regurgitation. There appears to be a partial flail of the   posterior MY leaflet. Visually MR appears mild at worst but   density of signal on CW Doppler suggests much more severe MR.   Would suggest TEE for further evaluation. Reviewed with Dr.   Aundra Dubin.  ------------------------------------------------------------------- Study data:   Study status:  Routine.  Procedure:  The patient reported no pain pre or post test. Transthoracic echocardiography for left ventricular function evaluation, for right ventricular function evaluation, and for assessment of valvular function. Image quality was adequate.  Study completion:  There were no complications.          Echocardiography.  M-mode, complete 2D, spectral Doppler, and color Doppler.  Birthdate:  Patient birthdate: 09-09-1945.  Age:  Patient is 71 yr old.  Sex:   Gender: male.    BMI: 33.2 kg/m^2.  Blood pressure:     120/70  Patient status:  Outpatient.  Study date:  Study date: 12/22/2015. Study time: 02:03 PM.  Location:  [place] [facility]  -------------------------------------------------------------------  ------------------------------------------------------------------- Left ventricle:  The cavity size was normal. Systolic function was normal. The estimated ejection fraction was in the range of 55% to 60%. Doppler parameters are consistent with abnormal left ventricular relaxation (grade 1 diastolic dysfunction).  ------------------------------------------------------------------- Aortic valve:   Structurally normal valve.   Cusp separation was normal.  Doppler:  Transvalvular velocity was within the normal range. There was no stenosis. There was no regurgitation.  ------------------------------------------------------------------- Aorta:  Aortic root: The aortic root was normal in size. Ascending aorta: The ascending aorta was normal in size.  ------------------------------------------------------------------- Mitral valve:   Moderate prolapse, involving the posterior leaflet.  Probable flail motion involving the posterior leaflet. There appears to be a partial flail of the posterior MY leaflet. Visually MR appears mild at worst but density of signal on CW Doppler suggests much more severe MR. Would suggest TEE for further evaluation. Reviewed with Dr. Aundra Dubin.  Doppler:  There was mild regurgitation.    Peak gradient (D): 3 mm Hg.  ------------------------------------------------------------------- Left atrium:  The atrium was normal in size.  ------------------------------------------------------------------- Right ventricle:  The cavity size was normal. Wall thickness was normal. Systolic function was normal.  ------------------------------------------------------------------- Pulmonic valve:    Structurally normal  valve.   Cusp separation was normal.  Doppler:  Transvalvular velocity was within the normal range. There was no regurgitation.  ------------------------------------------------------------------- Tricuspid valve:   Structurally normal valve.   Leaflet separation was normal.  Doppler:  Transvalvular velocity was within the normal range. There was trivial regurgitation.  ------------------------------------------------------------------- Right atrium:  The atrium was normal in size.  ------------------------------------------------------------------- Pericardium:  The pericardium was normal in appearance.  ------------------------------------------------------------------- Systemic veins: Inferior vena cava: The vessel was normal in size. The respirophasic diameter changes were in the normal range (= 50%), consistent with normal central venous pressure.  ------------------------------------------------------------------- Measurements  Left ventricle                         Value        Reference  LV ID, ED, PLAX chordal                51.4  mm     43 - 52  LV ID, ES, PLAX chordal                26.1  mm     23 - 38  LV fx shortening, PLAX chordal         49    %      >=29  LV PW thickness, ED                    11.5  mm     ---------  IVS/LV PW ratio, ED                    0.99         <=1.3  Stroke volume, 2D                      74    ml     ---------  Stroke volume/bsa, 2D                  34    ml/m^2 ---------  LV e&', lateral                         9.54  cm/s   ---------  LV E/e&', lateral                       8.86         ---------  LV e&', medial                          8.66  cm/s   ---------  LV E/e&', medial                        9.76         ---------  LV e&', average                         9.1   cm/s   ---------  LV E/e&', average                       9.29         ---------    Ventricular septum                     Value        Reference  IVS thickness,  ED                      11.4  mm     ---------    LVOT                                   Value        Reference  LVOT ID, S  23    mm     ---------  LVOT area                              4.15  cm^2   ---------  LVOT ID                                23    mm     ---------  LVOT peak velocity, S                  97.8  cm/s   ---------  LVOT mean velocity, S                  70    cm/s   ---------  LVOT VTI, S                            17.9  cm     ---------  LVOT peak gradient, S                  4     mm Hg  ---------  Stroke volume (SV), LVOT DP            74.4  ml     ---------  Stroke index (SV/bsa), LVOT DP         34.5  ml/m^2 ---------    Aorta                                  Value        Reference  Aortic root ID, ED                     38    mm     ---------  Ascending aorta ID, A-P, S             34    mm     ---------    Left atrium                            Value        Reference  LA ID, A-P, ES                         39    mm     ---------  LA ID/bsa, A-P                         1.81  cm/m^2 <=2.2  LA volume, S                           45    ml     ---------  LA volume/bsa, S                       20.9  ml/m^2 ---------  LA volume, ES, 1-p A4C                 53    ml     ---------  LA volume/bsa, ES, 1-p A4C  24.6  ml/m^2 ---------  LA volume, ES, 1-p A2C                 38    ml     ---------  LA volume/bsa, ES, 1-p A2C             17.6  ml/m^2 ---------    Mitral valve                           Value        Reference  Mitral E-wave peak velocity            84.5  cm/s   ---------  Mitral A-wave peak velocity            90.5  cm/s   ---------  Mitral deceleration time               222   ms     150 - 230  Mitral peak gradient, D                3     mm Hg  ---------  Mitral E/A ratio, peak                 0.9          ---------    Right ventricle                        Value        Reference  RV s&', lateral, S                       17.5  cm/s   ---------  Legend: (L)  and  (H)  mark values outside specified reference range.  ------------------------------------------------------------------- Prepared and Electronically Authenticated by  Pierre Bali, MD 2017-11-08T17:53:18   Transesophageal Echocardiography  Patient:    David Carr, David Carr MR #:       [id number] Study Date: 01/11/2016 Gender:     M Age:        70 Height:     174 cm Weight:     95.3 kg BSA:        2.18 m^2 Pt. Status: Room:   ADMITTING    Fransico Him, MD  SONOGRAPHER  Florentina Jenny, RDCS  ATTENDING    Lyman Bishop MD  [place] MD  PERFORMING   Lyman Bishop MD  REFERRING    Lyman Bishop MD  cc:  ------------------------------------------------------------------- LV EF: 55% -   60%  ------------------------------------------------------------------- Indications:      424.0 Mitral valve disease.  ------------------------------------------------------------------- History:   Risk factors:  Hypertension. Diabetes mellitus. Dyslipidemia.  ------------------------------------------------------------------- Study Conclusions  - Left ventricle: The cavity size was normal. There was mild   concentric hypertrophy. Systolic function was normal. The   estimated ejection fraction was in the range of 55% to 60%. Wall   motion was normal; there were no regional wall motion   abnormalities. - Aortic valve: No evidence of vegetation. - Mitral valve: Mid to late systolic prolapse of the P2 segement of   the mitral valve with a flail cord that prolapses into the left   atrium. There is severe regurgitation and reversal of flow was   noted in the LUPV. Regurgitant VTI: 159 cm. Effective regurgitant   orifice (PISA): 0.5 cm^2. Regurgitant volume (PISA): 80 ml. - Left  atrium: The atrium was dilated. No evidence of thrombus in   the atrial cavity or appendage. - Pulmonary veins: Systolic flow reversal in  the LUPV. - Right atrium: No evidence of thrombus in the atrial cavity or   appendage. - Atrial septum: There was a small patent foramen ovale with right   to left flow after valsalva. The interatrial septum is   hypermobile, but not quite aneurysmal.  Impressions:  - Severe MR associated with a flail P2 segment of the mitral valve.   Small PFO noted with valsalva.  ------------------------------------------------------------------- Study data:   Study status:  Routine.  Consent:  The risks, benefits, and alternatives to the procedure were explained to the patient and informed consent was obtained.  Procedure:  Initial setup. The patient was brought to the laboratory. Surface ECG leads were monitored. Sedation. Conscious sedation was administered by cardiology staff. Transesophageal echocardiography. Topical anesthesia was obtained using viscous lidocaine. A transesophageal probe was inserted by the attending cardiologist. Image quality was adequate.  Study completion:  The patient tolerated the procedure well. There were no complications.  Administered medications: Midazolam, 67m, IV.  Fentanyl, 586m, IV.          Diagnostic transesophageal echocardiography.  2D and color Doppler. Birthdate:  Patient birthdate: [birth year] Age:  Patient is [age]r old.  Sex:  Gender: male.    BMI: 31.5 kg/m^2.  Blood pressure: 130/73  Patient status:  Outpatient.  Study date:  Study date: 01/11/2016. Study time: 09:44 AM.  Location:  Endoscopy.  -------------------------------------------------------------------  ------------------------------------------------------------------- Left ventricle:  The cavity size was normal. There was mild concentric hypertrophy. Systolic function was normal. The estimated ejection fraction was in the range of 55% to 60%. Wall motion was normal; there were no regional wall motion  abnormalities.  ------------------------------------------------------------------- Aortic valve:   Structurally normal valve. Trileaflet. Cusp separation was normal.  No evidence of vegetation.  Doppler:  There was no regurgitation.  ------------------------------------------------------------------- Aorta:  The aorta was normal, not dilated, and non-diseased.  ------------------------------------------------------------------- Mitral valve:  Mid to late systolic prolapse of the P2 segement of the mitral valve with a flail cord that prolapses into the left atrium. There is severe regurgitation and reversal of flow was noted in the LUPV.  Doppler:     Peak gradient (D): 2 mm Hg.  ------------------------------------------------------------------- Left atrium:  The atrium was dilated.  No evidence of thrombus in the atrial cavity or appendage.  ------------------------------------------------------------------- Atrial septum:  There was a small patent foramen ovale with right to left flow after valsalva. The interatrial septum is hypermobile, but not quite aneurysmal.  ------------------------------------------------------------------- Pulmonary veins:  Systolic flow reversal in the LUPV.  ------------------------------------------------------------------- Right ventricle:  The cavity size was normal. Wall thickness was normal. Systolic function was normal.  ------------------------------------------------------------------- Pulmonic valve:    Doppler:  There was trivial regurgitation.  ------------------------------------------------------------------- Tricuspid valve:   Doppler:  There was trivial regurgitation.  ------------------------------------------------------------------- Pulmonary artery:   The main pulmonary artery was normal-sized.  ------------------------------------------------------------------- Right atrium:  The atrium was normal in size.  No  evidence of thrombus in the atrial cavity or appendage.  ------------------------------------------------------------------- Pericardium:  There was no pericardial effusion.   ------------------------------------------------------------------- Post procedure conclusions Ascending Aorta:  - The aorta was normal, not dilated, and non-diseased.  ------------------------------------------------------------------- Measurements   Aorta                                Value  Reference  Aortic arch ID, innominate-LCCA      28.16 mm    20 - 36    Mitral valve                         Value       Reference  Mitral E-wave peak velocity          74    cm/s  ---------  Mitral A-wave peak velocity          49.5  cm/s  ---------  Mitral peak gradient, D              2     mm Hg ---------  Mitral E/A ratio, peak               1.5         ---------  Mitral regurg VTI, PISA              159   cm    ---------  Mitral ERO, PISA                     0.5   cm^2  ---------  Mitral regurg volume, PISA           80    ml    ---------  Legend: (L)  and  (H)  mark values outside specified reference range.  ------------------------------------------------------------------- Prepared and Electronically Authenticated by  Zoila Shutter MD 2017-11-28T17:00:37   Impression:  Patient has mitral valve prolapse with stage D severe symptomatic primary mitral regurgitation.  I have personally reviewed the patient's recent transthoracic and transesophageal echocardiograms. 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 Left ventricular size and systolic function remained normal. The left atrium is dilated. The patient appears to be otherwise healthy although he has recently had problems with kidney stones requiring cystoscopy for  extraction.  He has not seen a dentist in many years.   Plan:  The patient and his wife were counseled at length regarding the indications, risks and potential benefits of mitral valve repair.  The rationale for elective surgery has been explained, including a comparison between surgery and continued medical therapy with close follow-up.  The likelihood of successful and durable valve repair has been discussed with particular reference to the findings of their recent echocardiogram.  Based upon these findings and previous experience, I have quoted them a greater than 95 percent likelihood of successful valve repair. The patient understands and accepts all potential risks of surgery including but not limited to risk of death, stroke or other neurologic complication, myocardial infarction, congestive heart failure, respiratory failure, renal failure, bleeding requiring transfusion and/or reexploration, arrhythmia, infection or other wound complications, pneumonia, pleural and/or pericardial effusion, pulmonary embolus, aortic dissection or other major vascular complication, or delayed complications related to valve repair or replacement including but not limited to structural valve deterioration and failure, thrombosis, embolization, endocarditis, or paravalvular leak.  Alternative surgical approaches have been discussed including a comparison between conventional sternotomy and minimally-invasive techniques.  The relative risks and benefits of each have been reviewed as they pertain to the patient's specific circumstances, and all of their questions have been addressed.  Specific risks potentially related to the minimally-invasive approach were discussed at length, including but not limited to risk of conversion to full or partial  sternotomy, aortic dissection or other major vascular complication, unilateral acute lung injury or pulmonary edema, phrenic nerve dysfunction or paralysis, rib fracture, chronic pain,  lung hernia, or lymphocele. All of their questions have been answered.  The patient hopes to proceed with surgery in the near future.  As a next step the patient will need to keep his follow-up appointment with Dr. Diona Fanti in urology to make sure that there is no sign of persistent or recurrent infection or retained kidney stone. He needs dental consultation for cleaning and routine exam. He will need to undergo diagnostic cardiac catheterization. Finally, we will plan CT angiography of the aorta and iliac vessels to evaluate the feasibility of peripheral arterial cannulation for surgery. We tentatively plan to proceed with surgery on 06/21/2016.  The patient will return to our office for follow-up on 06/19/2016.    I spent in excess of 90 minutes during the conduct of this office consultation and >50% of this time involved direct face-to-face encounter with the patient for counseling and/or coordination of their care.    Valentina Gu. Roxy Manns, MD 05/29/2016 1:09 PM

## 2016-06-07 NOTE — Progress Notes (Signed)
Anesthesia Chart Review: SAME DAY WORK-UP.  Patient is a 71 year old male scheduled for multiple teeth extractions with alveoloplasty and pre-prosthetic surgery as needed on 06/08/2016 by Dr. Enrique Sack. (Special needs: general with nasal tube.) He is scheduled for mini MVR on 06/21/16 with Dr. Roxy Manns.   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  PCP is listed as Dr. Stevie Kern. Cardiologist is Dr. Percival Spanish. HEM-ONC is Dr. Alvy Bimler. Endocrinologist is Dr. Cruzita Lederer.  Meds include aspirin 81 mg, atenolol, insulin glargine, metformin, Percocet, Zocor.  EKG 06/07/16: NSR, inferior infarct (age undetermined).  Cardiac cath RHC/LHC 06/07/16:  1. Mild nonobstructive CAD (40% pLAD, 50% small dLAD, 50% oDIAG1, mildly ectatic RCA) 2. Normal right heart hemodynamics with no evidence of pulmonary HTN 3. Known severe MR by noninvasive assessment  TEE 01/12/16: Study Conclusions - Left ventricle: The cavity size was normal. There was mild   concentric hypertrophy. Systolic function was normal. The   estimated ejection fraction was in the range of 55% to 60%. Wall   motion was normal; there were no regional wall motion   abnormalities. - Aortic valve: No evidence of vegetation. - Mitral valve: Mid to late systolic prolapse of the P2 segement of   the mitral valve with a flail cord that prolapses into the left   atrium. There is severe regurgitation and reversal of flow was   noted in the LUPV. Regurgitant VTI: 159 cm. Effective regurgitant   orifice (PISA): 0.5 cm^2. Regurgitant volume (PISA): 80 ml. - Left atrium: The atrium was dilated. No evidence of thrombus in   the atrial cavity or appendage. - Pulmonary veins: Systolic flow reversal in the LUPV. - Right atrium: No  evidence of thrombus in the atrial cavity or   appendage. - Atrial septum: There was a small patent foramen ovale with right   to left flow after valsalva. The interatrial septum is   hypermobile, but not quite aneurysmal. Impressions: - Severe MR associated with a flail P2 segment of the mitral valve.   Small PFO noted with valsalva.  He is scheduled for CTA chest/abd/pelvis on 06/16/16.  Labs from 06/01/16 and BMET and PT/INR from 06/07/16 noted. A1c 7.0 on 05/09/16.  He has known severe MR. Needs dental surgery prior to cardiac surgery. If no acute changes then I would anticipate that he can proceed as planned. Anesthesiologist to evaluate on the day of surgery.  [facility]/Anesthesiology Phone [phone] 06/07/2016 3:01 PM

## 2016-06-07 NOTE — Progress Notes (Signed)
Site area: right brachial  Site Prior to Removal:  Level 0  Pressure Applied For 20 MINUTES    Minutes Beginning at 1400  Manual:   Yes.    Patient Status During Pull:  WNL  Post Pull brachial Site:  Level 0  Post Pull Instructions Given:  Yes.    Post Pull Pulses Present:  Yes.    Dressing Applied:  Yes.

## 2016-06-07 NOTE — Interval H&P Note (Signed)
History and Physical Interval Note:  06/07/2016 1:00 PM  [name].  has presented today for surgery, with the diagnosis of pre-op  The various methods of treatment have been discussed with the patient and family. After consideration of risks, benefits and other options for treatment, the patient has consented to  Procedure(s): Right/Left Heart Cath and Coronary Angiography (N/A) as a surgical intervention .  The patient's history has been reviewed, patient examined, no change in status, stable for surgery.  I have reviewed the patient's chart and labs.  Questions were answered to the patient's satisfaction.     David Carr

## 2016-06-07 NOTE — Discharge Instructions (Signed)
Radial Site Care °Refer to this sheet in the next few weeks. These instructions provide you with information about caring for yourself after your procedure. Your health care provider may also give you more specific instructions. Your treatment has been planned according to current medical practices, but problems sometimes occur. Call your health care provider if you have any problems or questions after your procedure. °What can I expect after the procedure? °After your procedure, it is typical to have the following: °· Bruising at the radial site that usually fades within 1-2 weeks. °· Blood collecting in the tissue (hematoma) that may be painful to the touch. It should usually decrease in size and tenderness within 1-2 weeks. °Follow these instructions at home: °· Take medicines only as directed by your health care provider. °· You may shower 24-48 hours after the procedure or as directed by your health care provider. Remove the bandage (dressing) and gently wash the site with plain soap and water. Pat the area dry with a clean towel. Do not rub the site, because this may cause bleeding. °· Do not take baths, swim, or use a hot tub until your health care provider approves. °· Check your insertion site every day for redness, swelling, or drainage. °· Do not apply powder or lotion to the site. °· Do not flex or bend the affected arm for 24 hours or as directed by your health care provider. °· Do not push or pull heavy objects with the affected arm for 24 hours or as directed by your health care provider. °· Do not lift over 10 lb (4.5 kg) for 5 days after your procedure or as directed by your health care provider. °· Ask your health care provider when it is okay to: °¨ Return to work or school. °¨ Resume usual physical activities or sports. °¨ Resume sexual activity. °· Do not drive home if you are discharged the same day as the procedure. Have someone else drive you. °· You may drive 24 hours after the procedure  unless otherwise instructed by your health care provider. °· Do not operate machinery or power tools for 24 hours after the procedure. °· If your procedure was done as an outpatient procedure, which means that you went home the same day as your procedure, a responsible adult should be with you for the first 24 hours after you arrive home. °· Keep all follow-up visits as directed by your health care provider. This is important. °Contact a health care provider if: °· You have a fever. °· You have chills. °· You have increased bleeding from the radial site. Hold pressure on the site. CALL 911 °Get help right away if: °· You have unusual pain at the radial site. °· You have redness, warmth, or swelling at the radial site. °· You have drainage (other than a small amount of blood on the dressing) from the radial site. °· The radial site is bleeding, and the bleeding does not stop after 30 minutes of holding steady pressure on the site. °· Your arm or hand becomes pale, cool, tingly, or numb. °This information is not intended to replace advice given to you by your health care provider. Make sure you discuss any questions you have with your health care provider. °Document Released: 03/04/2010 Document Revised: 07/08/2015 Document Reviewed: 08/18/2013 °Elsevier Interactive Patient Education © 2017 [employer] ° ° °

## 2016-06-08 ENCOUNTER — Ambulatory Visit (HOSPITAL_COMMUNITY)
Admission: RE | Admit: 2016-06-08 | Discharge: 2016-06-08 | Disposition: A | Discharge status: Home / Self Care | Payer: 59 | Source: Ambulatory Visit | Attending: Dentistry | Admitting: Dentistry

## 2016-06-08 ENCOUNTER — Ambulatory Visit (HOSPITAL_COMMUNITY): Payer: 59 | Admitting: Anesthesiology

## 2016-06-08 ENCOUNTER — Encounter (HOSPITAL_COMMUNITY): Payer: Self-pay | Admitting: *Deleted

## 2016-06-08 ENCOUNTER — Encounter (HOSPITAL_COMMUNITY)
Admission: RE | Disposition: A | Discharge status: Home / Self Care | Payer: Self-pay | Source: Ambulatory Visit | Attending: Dentistry

## 2016-06-08 DIAGNOSIS — E785 Hyperlipidemia, unspecified: Secondary | ICD-10-CM | POA: Insufficient documentation

## 2016-06-08 DIAGNOSIS — Z7982 Long term (current) use of aspirin: Secondary | ICD-10-CM | POA: Insufficient documentation

## 2016-06-08 DIAGNOSIS — K0530 Chronic periodontitis, unspecified: Secondary | ICD-10-CM

## 2016-06-08 DIAGNOSIS — I10 Essential (primary) hypertension: Secondary | ICD-10-CM | POA: Insufficient documentation

## 2016-06-08 DIAGNOSIS — K029 Dental caries, unspecified: Secondary | ICD-10-CM | POA: Insufficient documentation

## 2016-06-08 DIAGNOSIS — Z794 Long term (current) use of insulin: Secondary | ICD-10-CM | POA: Insufficient documentation

## 2016-06-08 DIAGNOSIS — K045 Chronic apical periodontitis: Secondary | ICD-10-CM | POA: Insufficient documentation

## 2016-06-08 DIAGNOSIS — Z87442 Personal history of urinary calculi: Secondary | ICD-10-CM | POA: Diagnosis not present

## 2016-06-08 DIAGNOSIS — I340 Nonrheumatic mitral (valve) insufficiency: Secondary | ICD-10-CM | POA: Insufficient documentation

## 2016-06-08 DIAGNOSIS — Q211 Atrial septal defect: Secondary | ICD-10-CM | POA: Diagnosis not present

## 2016-06-08 DIAGNOSIS — E119 Type 2 diabetes mellitus without complications: Secondary | ICD-10-CM | POA: Insufficient documentation

## 2016-06-08 DIAGNOSIS — Z79899 Other long term (current) drug therapy: Secondary | ICD-10-CM | POA: Diagnosis not present

## 2016-06-08 DIAGNOSIS — Z856 Personal history of leukemia: Secondary | ICD-10-CM | POA: Insufficient documentation

## 2016-06-08 DIAGNOSIS — I341 Nonrheumatic mitral (valve) prolapse: Secondary | ICD-10-CM | POA: Diagnosis not present

## 2016-06-08 DIAGNOSIS — K083 Retained dental root: Secondary | ICD-10-CM | POA: Diagnosis not present

## 2016-06-08 DIAGNOSIS — M278 Other specified diseases of jaws: Secondary | ICD-10-CM | POA: Insufficient documentation

## 2016-06-08 DIAGNOSIS — M270 Developmental disorders of jaws: Secondary | ICD-10-CM

## 2016-06-08 DIAGNOSIS — Z87891 Personal history of nicotine dependence: Secondary | ICD-10-CM | POA: Diagnosis not present

## 2016-06-08 DIAGNOSIS — Z6830 Body mass index (BMI) 30.0-30.9, adult: Secondary | ICD-10-CM | POA: Insufficient documentation

## 2016-06-08 DIAGNOSIS — I2510 Atherosclerotic heart disease of native coronary artery without angina pectoris: Secondary | ICD-10-CM | POA: Insufficient documentation

## 2016-06-08 HISTORY — DX: Gastro-esophageal reflux disease without esophagitis: K21.9

## 2016-06-08 HISTORY — PX: MULTIPLE EXTRACTIONS WITH ALVEOLOPLASTY: SHX5342

## 2016-06-08 HISTORY — DX: Dental caries, unspecified: K02.9

## 2016-06-08 LAB — GLUCOSE, CAPILLARY
GLUCOSE-CAPILLARY: 169 mg/dL — AB (ref 65–99)
Glucose-Capillary: 133 mg/dL — ABNORMAL HIGH (ref 65–99)

## 2016-06-08 SURGERY — MULTIPLE EXTRACTION WITH ALVEOLOPLASTY
Anesthesia: General | Site: Mouth

## 2016-06-08 MED ORDER — FENTANYL CITRATE (PF) 250 MCG/5ML IJ SOLN
INTRAMUSCULAR | Status: DC | PRN
  Administered 2016-06-08: 50 ug via INTRAVENOUS
  Administered 2016-06-08: 100 ug via INTRAVENOUS
  Administered 2016-06-08: 50 ug via INTRAVENOUS

## 2016-06-08 MED ORDER — OXYCODONE-ACETAMINOPHEN 5-325 MG PO TABS: ORAL_TABLET | ORAL | 0 refills | Status: DC

## 2016-06-08 MED ORDER — AMINOCAPROIC ACID SOLUTION 5% (50 MG/ML)
10.0000 mL | ORAL | Status: DC
  Filled 2016-06-08: qty 100

## 2016-06-08 MED ORDER — DEXAMETHASONE SODIUM PHOSPHATE 10 MG/ML IJ SOLN
INTRAMUSCULAR | Status: AC
  Filled 2016-06-08: qty 1

## 2016-06-08 MED ORDER — PROPOFOL 10 MG/ML IV BOLUS
INTRAVENOUS | Status: AC
  Filled 2016-06-08: qty 20

## 2016-06-08 MED ORDER — STERILE WATER FOR IRRIGATION IR SOLN
Status: DC | PRN
  Administered 2016-06-08: 1000 mL

## 2016-06-08 MED ORDER — FENTANYL CITRATE (PF) 100 MCG/2ML IJ SOLN
INTRAMUSCULAR | Status: AC
  Filled 2016-06-08: qty 2

## 2016-06-08 MED ORDER — PROPOFOL 10 MG/ML IV BOLUS
INTRAVENOUS | Status: DC | PRN
  Administered 2016-06-08: 20 mg via INTRAVENOUS
  Administered 2016-06-08: 120 mg via INTRAVENOUS
  Administered 2016-06-08: 20 mg via INTRAVENOUS

## 2016-06-08 MED ORDER — SCOPOLAMINE 1 MG/3DAYS TD PT72
MEDICATED_PATCH | TRANSDERMAL | Status: DC | PRN
  Administered 2016-06-08: 1 via TRANSDERMAL

## 2016-06-08 MED ORDER — LACTATED RINGERS IV SOLN
INTRAVENOUS | Status: DC | PRN
  Administered 2016-06-08 (×2): via INTRAVENOUS

## 2016-06-08 MED ORDER — OXYMETAZOLINE HCL 0.05 % NA SOLN
NASAL | Status: DC | PRN
  Administered 2016-06-08: 1 via TOPICAL

## 2016-06-08 MED ORDER — EPHEDRINE 5 MG/ML INJ
INTRAVENOUS | Status: AC
  Filled 2016-06-08: qty 10

## 2016-06-08 MED ORDER — OXYCODONE HCL 5 MG PO TABS: 5.0000 mg | ORAL_TABLET | Freq: Once | ORAL | Status: DC | PRN

## 2016-06-08 MED ORDER — ONDANSETRON HCL 4 MG/2ML IJ SOLN
INTRAMUSCULAR | Status: DC | PRN
  Administered 2016-06-08: 4 mg via INTRAVENOUS

## 2016-06-08 MED ORDER — 0.9 % SODIUM CHLORIDE (POUR BTL) OPTIME
TOPICAL | Status: DC | PRN
  Administered 2016-06-08: 1000 mL

## 2016-06-08 MED ORDER — LIDOCAINE 2% (20 MG/ML) 5 ML SYRINGE
INTRAMUSCULAR | Status: AC
  Filled 2016-06-08: qty 5

## 2016-06-08 MED ORDER — FENTANYL CITRATE (PF) 100 MCG/2ML IJ SOLN
25.0000 ug | INTRAMUSCULAR | Status: DC | PRN
  Administered 2016-06-08 (×2): 50 ug via INTRAVENOUS

## 2016-06-08 MED ORDER — SUCCINYLCHOLINE CHLORIDE 200 MG/10ML IV SOSY
PREFILLED_SYRINGE | INTRAVENOUS | Status: AC
  Filled 2016-06-08: qty 10

## 2016-06-08 MED ORDER — LIDOCAINE-EPINEPHRINE 2 %-1:100000 IJ SOLN
INTRAMUSCULAR | Status: DC | PRN
  Administered 2016-06-08: 10.2 mL via INTRADERMAL

## 2016-06-08 MED ORDER — EPHEDRINE SULFATE 50 MG/ML IJ SOLN
INTRAMUSCULAR | Status: DC | PRN
  Administered 2016-06-08 (×3): 5 mg via INTRAVENOUS

## 2016-06-08 MED ORDER — LIDOCAINE-EPINEPHRINE 2 %-1:100000 IJ SOLN
INTRAMUSCULAR | Status: AC
  Filled 2016-06-08: qty 10.2

## 2016-06-08 MED ORDER — FENTANYL CITRATE (PF) 250 MCG/5ML IJ SOLN
INTRAMUSCULAR | Status: AC
  Filled 2016-06-08: qty 5

## 2016-06-08 MED ORDER — SUCCINYLCHOLINE CHLORIDE 20 MG/ML IJ SOLN
INTRAMUSCULAR | Status: DC | PRN
  Administered 2016-06-08: 80 mg via INTRAVENOUS

## 2016-06-08 MED ORDER — OXYMETAZOLINE HCL 0.05 % NA SOLN
NASAL | Status: AC
  Filled 2016-06-08: qty 15

## 2016-06-08 MED ORDER — DEXAMETHASONE SODIUM PHOSPHATE 10 MG/ML IJ SOLN
INTRAMUSCULAR | Status: DC | PRN
  Administered 2016-06-08: 5 mg via INTRAVENOUS

## 2016-06-08 MED ORDER — BUPIVACAINE-EPINEPHRINE 0.5% -1:200000 IJ SOLN
INTRAMUSCULAR | Status: DC | PRN
  Administered 2016-06-08: 3.6 mL

## 2016-06-08 MED ORDER — OXYCODONE HCL 5 MG/5ML PO SOLN: 5.0000 mg | Freq: Once | ORAL | Status: DC | PRN

## 2016-06-08 MED ORDER — MIDAZOLAM HCL 5 MG/5ML IJ SOLN
INTRAMUSCULAR | Status: DC | PRN
  Administered 2016-06-08: 1 mg via INTRAVENOUS

## 2016-06-08 MED ORDER — AMINOCAPROIC ACID SOLUTION 5% (50 MG/ML)
ORAL | Status: DC | PRN
  Administered 2016-06-08: 10 mL via ORAL

## 2016-06-08 MED ORDER — HEMOSTATIC AGENTS (NO CHARGE) OPTIME
TOPICAL | Status: DC | PRN
  Administered 2016-06-08: 1 via TOPICAL

## 2016-06-08 MED ORDER — PHENYLEPHRINE HCL 10 MG/ML IJ SOLN
INTRAMUSCULAR | Status: DC | PRN
  Administered 2016-06-08: 25 ug/min via INTRAVENOUS

## 2016-06-08 MED ORDER — ONDANSETRON HCL 4 MG/2ML IJ SOLN
INTRAMUSCULAR | Status: AC
  Filled 2016-06-08: qty 2

## 2016-06-08 MED ORDER — MIDAZOLAM HCL 2 MG/2ML IJ SOLN
INTRAMUSCULAR | Status: AC
  Filled 2016-06-08: qty 2

## 2016-06-08 MED ORDER — PHENYLEPHRINE 40 MCG/ML (10ML) SYRINGE FOR IV PUSH (FOR BLOOD PRESSURE SUPPORT)
PREFILLED_SYRINGE | INTRAVENOUS | Status: AC
  Filled 2016-06-08: qty 10

## 2016-06-08 MED ORDER — BUPIVACAINE-EPINEPHRINE (PF) 0.5% -1:200000 IJ SOLN
INTRAMUSCULAR | Status: AC
  Filled 2016-06-08: qty 3.6

## 2016-06-08 MED ORDER — PHENYLEPHRINE HCL 10 MG/ML IJ SOLN
INTRAMUSCULAR | Status: DC | PRN
  Administered 2016-06-08 (×2): 80 ug via INTRAVENOUS
  Administered 2016-06-08: 40 ug via INTRAVENOUS

## 2016-06-08 MED ORDER — SCOPOLAMINE 1 MG/3DAYS TD PT72
MEDICATED_PATCH | TRANSDERMAL | Status: AC
  Filled 2016-06-08: qty 1

## 2016-06-08 MED FILL — Fentanyl Citrate Preservative Free (PF) Inj 100 MCG/2ML: INTRAMUSCULAR | Qty: 2 | Status: AC

## 2016-06-08 SURGICAL SUPPLY — 37 items
ALCOHOL 70% 16 OZ (MISCELLANEOUS) ×3 IMPLANT
ATTRACTOMAT 16X20 MAGNETIC DRP (DRAPES) ×3 IMPLANT
BLADE SURG 15 STRL LF DISP TIS (BLADE) ×2 IMPLANT
BLADE SURG 15 STRL SS (BLADE) ×6
COVER SURGICAL LIGHT HANDLE (MISCELLANEOUS) ×3 IMPLANT
GAUZE PACKING FOLDED 2  STR (GAUZE/BANDAGES/DRESSINGS) ×2
GAUZE PACKING FOLDED 2 STR (GAUZE/BANDAGES/DRESSINGS) ×1 IMPLANT
GAUZE SPONGE 4X4 16PLY XRAY LF (GAUZE/BANDAGES/DRESSINGS) ×5 IMPLANT
GLOVE BIOGEL PI IND STRL 6 (GLOVE) ×1 IMPLANT
GLOVE BIOGEL PI INDICATOR 6 (GLOVE) ×2
GLOVE SURG ORTHO 8.0 STRL STRW (GLOVE) ×3 IMPLANT
GLOVE SURG SS PI 6.0 STRL IVOR (GLOVE) ×3 IMPLANT
GOWN STRL REUS W/ TWL LRG LVL3 (GOWN DISPOSABLE) ×1 IMPLANT
GOWN STRL REUS W/TWL 2XL LVL3 (GOWN DISPOSABLE) ×3 IMPLANT
GOWN STRL REUS W/TWL LRG LVL3 (GOWN DISPOSABLE) ×3
HEMOSTAT SURGICEL 2X14 (HEMOSTASIS) ×1 IMPLANT
KIT BASIN OR (CUSTOM PROCEDURE TRAY) ×3 IMPLANT
KIT ROOM TURNOVER OR (KITS) ×3 IMPLANT
MANIFOLD NEPTUNE WASTE (CANNULA) ×3 IMPLANT
NDL BLUNT 16X1.5 OR ONLY (NEEDLE) ×1 IMPLANT
NEEDLE BLUNT 16X1.5 OR ONLY (NEEDLE) ×3 IMPLANT
NS IRRIG 1000ML POUR BTL (IV SOLUTION) ×3 IMPLANT
PACK EENT II TURBAN DRAPE (CUSTOM PROCEDURE TRAY) ×3 IMPLANT
PAD ARMBOARD 7.5X6 YLW CONV (MISCELLANEOUS) ×3 IMPLANT
SPONGE SURGIFOAM ABS GEL 100 (HEMOSTASIS) ×2 IMPLANT
SPONGE SURGIFOAM ABS GEL 12-7 (HEMOSTASIS) IMPLANT
SPONGE SURGIFOAM ABS GEL SZ50 (HEMOSTASIS) IMPLANT
SUCTION FRAZIER HANDLE 10FR (MISCELLANEOUS) ×2
SUCTION TUBE FRAZIER 10FR DISP (MISCELLANEOUS) ×1 IMPLANT
SUT CHROMIC 3 0 PS 2 (SUTURE) ×10 IMPLANT
SUT CHROMIC 4 0 P 3 18 (SUTURE) IMPLANT
SYR 50ML SLIP (SYRINGE) ×3 IMPLANT
TOWEL OR 17X26 10 PK STRL BLUE (TOWEL DISPOSABLE) ×3 IMPLANT
TUBE CONNECTING 12'X1/4 (SUCTIONS) ×1
TUBE CONNECTING 12X1/4 (SUCTIONS) ×2 IMPLANT
WATER TABLETS ICX (MISCELLANEOUS) ×3 IMPLANT
YANKAUER SUCT BULB TIP NO VENT (SUCTIONS) ×3 IMPLANT

## 2016-06-08 NOTE — Discharge Instructions (Signed)
SINUS PRECAUTIONS AFTER ORAL SURGERY  AVOID   Blowing your nose  It is best to wipe away nasal secretions carefully. After 2 weeks, if you must blow  your nose, blow gently through both sides at the same time. Do not pinch your  nose; do not blow just one side at a time.   Sneezing  If you must sneeze, keep your mouth open and do not pinch your nose closed.   Sucking  Do not drink through a straw. Do not smoke.    Blowing  Do not play a wind instrument. Do not blow a balloons.   Pushing or lifting  Do not lift or push objects weighing more than 20 pounds.   Bending over  Keeping her head above the level of your heart. Sleep with your head slightly raised.   Notify your dentist if you bleed from your nose. If you see bleeding from your nose, have neck stiffness, or increased sensitivity to bright light or severe headache, call Dental Medicine or proceed to the Emergency Department for evaluation immediately.  Notify your dentist if you are unable to take any of your medications as prescribed. You may be advised to take an antibiotic and decongestant as well as your regular pain medication. You must take these medications as prescribed. Do not stop taking them on her own. If you have a problem with any medication, please call us so that we can make an adjustment for you.  Contacts: 1. Dental Medicine: [phone] 8. [facility]: [phone]    MOUTH CARE AFTER SURGERY  FACTS:  Ice used in ice bag helps keep the swelling down, and can help lessen the pain.  It is easier to treat pain BEFORE it happens.  Spitting disturbs the clot and may cause bleeding to start again, or to get worse.  Smoking delays healing and can cause complications.  Sharing prescriptions can be dangerous.  Do not take medications not recently prescribed for you.  Antibiotics may stop birth control pills from working.  Use other means of birth control while on  antibiotics.  Warm salt water rinses after the first 24 hours will help lessen the swelling:  Use 1/2 teaspoonful of table salt per oz.of water.  DO NOT:  Do not spit.  Do not drink through a straw.  Strongly advised not to smoke, dip snuff or chew tobacco at least for 3 days.  Do not eat sharp or crunchy foods.  Avoid the area of surgery when chewing.  Do not stop your antibiotics before your instructions say to do so.  Do not eat hot foods until bleeding has stopped.  If you need to, let your food cool down to room temperature.  EXPECT:  Some swelling, especially first 2-3 days.  Soreness or discomfort in varying degrees.  Follow your dentist's instructions about how to handle pain before it starts.  Pinkish saliva or light blood in saliva, or on your pillow in the morning.  This can last around 24 hours.  Bruising inside or outside the mouth.  This may not show up until 2-3 days after surgery.  Don't worry, it will go away in time.  Pieces of "bone" may work themselves loose.  It's OK.  If they bother you, let us know.  WHAT TO DO IMMEDIATELY AFTER SURGERY:  Bite on the gauze with steady pressure for 1-2 hours.  Don't chew on the gauze.  Do not lie down flat.  Raise your head support especially for  the first 24 hours.  Apply ice to your face on the side of the surgery.  You may apply it 20 minutes on and a few minutes off.  Ice for 8-12 hours.  You may use ice up to 24 hours.  Before the numbness wears off, take a pain pill as instructed.  Prescription pain medication is not always required.  SWELLING:  Expect swelling for the first couple of days.  It should get better after that.  If swelling increases 3 days or so after surgery; let us know as soon as possible.  FEVER:  Take Tylenol every 4 hours if needed to lower your temperature, especially if it is at 100F or higher.  Drink lots of fluids.  If the fever does not go away, let us know.  BREATHING  TROUBLE:  Any unusual difficulty breathing means you have to have someone bring you to the emergency room ASAP  BLEEDING:  Light oozing is expected for 24 hours or so.  Prop head up with pillows  Avoid spitting  Do not confuse bright red fresh flowing blood with lots of saliva colored with a little bit of blood.  If you notice some bleeding, place gauze or a tea bag where it is bleeding and apply CONSTANT pressure by biting down for 1 hour.  Avoid talking during this time.  Do not remove the gauze or tea bag during this hour to "check" the bleeding.  If you notice bright RED bleeding FLOWING out of particular area, and filling the floor of your mouth, put a wad of gauze on that area, bite down firmly and constantly.  Call us immediately.  If we're closed, have someone bring you to the emergency room.  ORAL HYGIENE:  Brush your teeth as usual after meals and before bedtime.  Use a soft toothbrush around the area of surgery.  DO NOT AVOID BRUSHING.  Otherwise bacteria(germs) will grow and may delay healing or encourage infection.  Since you cannot spit, just gently rinse and let the water flow out of your mouth.  DO NOT SWISH HARD.  EATING:  Cool liquids are a good point to start.  Increase to soft foods as tolerated.  PRESCRIPTIONS:  Follow the directions for your prescriptions exactly as written.  If Dr. Enrique Sack gave you a narcotic pain medication, do not drive, operate machinery or drink alcohol when on that medication.  QUESTIONS:  Call our office during office hours [phone] or call the Emergency Room at [phone].

## 2016-06-08 NOTE — Anesthesia Procedure Notes (Signed)
Procedure Name: Intubation Date/Time: 06/08/2016 7:43 AM Performed by: Jenne Campus Pre-anesthesia Checklist: Patient identified, Emergency Drugs available, Suction available, Patient being monitored and Timeout performed Patient Re-evaluated:Patient Re-evaluated prior to inductionOxygen Delivery Method: Circle system utilized Preoxygenation: Pre-oxygenation with 100% oxygen Intubation Type: IV induction Ventilation: Mask ventilation without difficulty Laryngoscope Size: Mac and 4 Grade View: Grade I Nasal Tubes: Right, Nasal prep performed, Nasal Rae and Magill forceps- large, utilized Tube size: 7.0 mm Number of attempts: 1 Placement Confirmation: ETT inserted through vocal cords under direct vision,  positive ETCO2 and breath sounds checked- equal and bilateral Secured at: 28 cm Tube secured with: Tape Dental Injury: Teeth and Oropharynx as per pre-operative assessment

## 2016-06-08 NOTE — Progress Notes (Signed)
PRE-OPERATIVE NOTE:  06/08/2016 David Carr. [id number]  VITALS: BP 135/84   Pulse 72   Temp 98.4 F (36.9 C) (Oral)   Resp 16   Ht 5\' 8"  (1.727 m)   Wt 202 lb (91.6 kg)   SpO2 92%   BMI 30.71 kg/m   Lab Results  Component Value Date   WBC 5.9 06/01/2016   HGB 14.9 06/01/2016   HCT 43.3 06/01/2016   MCV 94.4 06/01/2016   PLT 149 06/01/2016   BMET    Component Value Date/Time   NA 140 06/07/2016 1004   NA 138 12/02/2015 1335   K 4.1 06/07/2016 1004   K 4.1 12/02/2015 1335   CL 107 06/07/2016 1004   CO2 26 06/07/2016 1004   CO2 24 12/02/2015 1335   GLUCOSE 160 (H) 06/07/2016 1004   GLUCOSE 252 (H) 12/02/2015 1335   BUN 12 06/07/2016 1004   BUN 14.2 12/02/2015 1335   CREATININE 0.93 06/07/2016 1004   CREATININE 0.90 01/03/2016 1056   CREATININE 1.0 12/02/2015 1335   CALCIUM 9.2 06/07/2016 1004   CALCIUM 9.0 12/02/2015 1335   GFRNONAA >60 06/07/2016 1004   GFRAA >60 06/07/2016 1004    Lab Results  Component Value Date   INR 0.99 06/07/2016   INR 1.0 01/03/2016   INR 1.03 02/09/2015   No results found for: PTT   David Carr. presents for Multiple dental extractions with alveoloplasty in the operating room with general anesthesia. Patient had cardiac catheterization yesterday which did not reveal any significant coronary artery disease.   SUBJECTIVE: The patient denies any acute medical or dental changes and agrees to proceed with treatment as planned.  EXAM: No sign of acute dental changes.  ASSESSMENT: Patient is affected by chronic apical periodontitis, multiple retained root segments, dental caries, chronic periodontitis, excessive attrition.  PLAN: Patient agrees to proceed with treatment as planned in the operating room as previously discussed and accepts the risks, benefits, and complications of the proposed treatment. Patient is aware of the risk for bleeding, bruising, swelling, infection, pain, nerve damage, soft tissue  damage, sinus involvement, root tip fracture, mandible fracture, and the risks of complications associated with the anesthesia. Patient also is aware of the potential for other complications up to and including death due to his overall cardiovascular compromise.    Lenn Cal, DDS

## 2016-06-08 NOTE — Transfer of Care (Signed)
Immediate Anesthesia Transfer of Care Note  Patient: David Carr.  Procedure(s) Performed: Procedure(s): MULTIPLE EXTRACTION WITH ALVEOLOPLASTY AND PRE-PROSTHETIC SURGERY AS NEEDED (N/A)  Patient Location: PACU  Anesthesia Type:General  Level of Consciousness: awake, oriented and patient cooperative  Airway & Oxygen Therapy: Patient Spontanous Breathing and Patient connected to nasal cannula oxygen  Post-op Assessment: Report given to RN and Post -op Vital signs reviewed and stable  Post vital signs: Reviewed and stable  Last Vitals:  Vitals:   06/08/16 0557 06/08/16 1019  BP: 135/84 126/74  Pulse: 72 81  Resp: 16 15  Temp: 36.9 C 36.4 C    Last Pain:  Vitals:   06/08/16 1026  TempSrc:   PainSc: 5       Patients Stated Pain Goal: 4 (32/02/33 4356)  Complications: No apparent anesthesia complications

## 2016-06-08 NOTE — H&P (Signed)
06/08/2016  Patient:            David Carr. Date of Birth:  1945/12/13 MRN:                [id number]   BP 135/84   Pulse 72   Temp 98.4 F (36.9 C) (Oral)   Resp 16   Ht '5\' 8"'$  (1.727 m)   Wt 202 lb (91.6 kg)   SpO2 92%   BMI 30.71 kg/m    David Carr. is a 71 year-old male that presents for multiple dental extractions with alveoloplasty and pre-prosthetic surgery as needed in the operative room with general anesthesia. Patient denies any acute medical or dental changes. Please see note from Dr. Roxy Manns dated 05/29/2016 to use as the H&P for the dental operating room procedure.  Lenn Cal, DDS     Rexene Alberts, MD  Cardiothoracic Surgery    '[]'$ Hide copied text       [address]       [place] [address]             [phone]                CARDIOTHORACIC SURGERY CONSULTATION REPORT  Referring Provider is Minus Breeding, MD PCP is Joycelyn Man, MD      Chief Complaint  Patient presents with  . Mitral Regurgitation    Surgical eval, TEE 01/11/16     HPI:  Patient is a 71 year old male with history of mitral valve prolapse with mitral regurgitation, hypertension, type II diabetes, acute myeloid leukemia in remission, hyperlipidemia, and kidney stones who has been referred for surgical consultation to discuss management options for mitral valve prolapse with severe primary mitral regurgitation. The patient states that he was first noted to have a heart murmur on physical exam performed by his primary care physician many years ago. He was referred to Dr. Percival Spanish and has undergone transthoracic echocardiogram regularly in follow-up.   In 2015 he developed AML and underwent chemotherapy at [facility]  He recovered from this uneventfully and has remained in remission since. Last fall he underwent routine follow-up echocardiogram which revealed what appeared to be significant progression of severity  of mitral regurgitation. Transesophageal echocardiogram was performed 01/11/2016 and confirmed the presence of a flail segment involving the middle portion of the posterior leaflet with ruptured chordae tendineae and severe mitral regurgitation. Left ventricular systolic function remained normal with ejection fraction estimated 55-60%. There was left atrial chamber enlargement. There was a small patent foramen ovale. He was referred for elective surgical consultation. In addition, the patient has recently had problems with kidney stones. He passed 2 of 3 stones on his own but recently had a third stone become lodged, requiring cystoscopy for extraction.    Patient is married and lives locally in [place] with his wife. He is retired having previously worked as an Optometrist. He has remained fairly active physically during retirement although he states that his bout with AML and chemotherapy slowed down quite a bit several years ago. He still enjoys essentially normal physical activity without any significant limitations. He just recently underwent cystoscopy for extraction of an impacted kidney stone. Subsequent to this he had problems with severe constipation in the setting of prolonged oral antibiotics. All of this seems to be resolving. He states that he has began to experience mild symptoms of exertional shortness of breath. This occurs only with more strenuous physical exertion such as  going up a flight of stairs or walking up a steep hill. Breathing is comfortable with ordinary activity. He has not had any PND, orthopnea, or lower extremity edema. He denies any history of chest tightness or chest pressure. He has had some dizzy spells without syncope. He denies any history of palpitations.      Past Medical History:  Diagnosis Date  . AML (acute myeloid leukemia) in remission [facility]) oncologist-  dr Alvy Bimler-- per last note 10/ 2017 in remission 2 years   dx 09-11-2013  via bone marrow bx , FLT3  negative (NP M1 +)/  chemotherapy started 09-17-2013,  remission via marrow bx 11-06-2013,  4 cycles consolidation chemo w/ HiDAC 11-13-2013 to 03-05-2014  . BPH with urinary obstruction   . Chronic thrombocytopenic purpura ([place])   . Heart murmur   . History of kidney stones   . Hyperlipidemia   . Hypertension   . MVP (mitral valve prolapse)   . Pancytopenia, acquired ([place])   . Right ureteral stone   . Severe mitral regurgitation   . Type 2 diabetes mellitus with hypoglycemia, with long-term current use of insulin [facility])    endocrinologist-  dr Con Memos  . Wears dentures    upper         Past Surgical History:  Procedure Laterality Date  . CARDIOVASCULAR STRESS TEST  07/23/2013   Low risk nuclear study w/ mild inferior ischemia/  normal LV function and wall motion, ef 69%  . CYSTOSCOPY WITH RETROGRADE PYELOGRAM, URETEROSCOPY AND STENT PLACEMENT Right 04/20/2016   Procedure: CYSTOSCOPY WITH RETROGRADE PYELOGRAM, URETEROSCOPY , STONE BASKETRY AND STENT PLACEMENT;  Surgeon: Franchot Gallo, MD;  Location: [facility];  Service: Urology;  Laterality: Right;  . EXTRACORPOREAL SHOCK WAVE LITHOTRIPSY  yrs ago  . HOLMIUM LASER APPLICATION Right 1/0/0712   Procedure: HOLMIUM LASER APPLICATION;  Surgeon: Franchot Gallo, MD;  Location: [facility];  Service: Urology;  Laterality: Right;  . INGUINAL HERNIA REPAIR Left 1990's  . ROTATOR CUFF REPAIR Right x2  last one 2011  . TEE WITHOUT CARDIOVERSION N/A 01/11/2016   Procedure: TRANSESOPHAGEAL ECHOCARDIOGRAM (TEE);  Surgeon: Pixie Casino, MD;  Location: [facility] ENDOSCOPY;  Service: Cardiovascular;  Laterality: N/A; Severe MR associated w/ flail P2 segment of MV;  no LAA thromus;  small PFO by saline microbubble contrast after valsalva; LVEF 55-60%  . TENNIS ELBOW RELEASE/NIRSCHEL PROCEDURE Right 1990's  . TRANSTHORACIC ECHOCARDIOGRAM  12/22/2015   grade 1 diastolic dysfunction,  ef 55-60%/   moderate MVP involving posterior leaflet w/ partial flail and severe MR via CW doppler (peak gradient 51mHg)/  trivial TR          Family History  Problem Relation Age of Onset  . Asthma Sister   . Cancer Daughter     carcinoid tumor    Social History        Social History  . Marital status: Married    Spouse name: N/A  . Number of children: N/A  . Years of education: N/A      Occupational History  . Not on file.        Social History Main Topics  . Smoking status: Former Smoker    Years: 20.00    Types: Cigarettes    Quit date: 04/18/1974  . Smokeless tobacco: Never Used  . Alcohol use No  . Drug use: No  . Sexual activity: Not on file       Other Topics Concern  . Not on  file      Social History Narrative  . No narrative on file          Current Outpatient Prescriptions  Medication Sig Dispense Refill  . aspirin 81 MG tablet Take 81 mg by mouth daily.    Marland Kitchen atenolol (TENORMIN) 25 MG tablet Take 0.5 tablets (12.5 mg total) by mouth daily. (Patient taking differently: Take 12.5 mg by mouth every morning. ) 45 tablet 3  . Insulin Glargine (BASAGLAR KWIKPEN) 100 UNIT/ML SOPN Inject 30 units into the skin at bedtime. 10 pen 0  . metFORMIN (GLUCOPHAGE-XR) 500 MG 24 hr tablet Take 1 tablet (500 mg total) by mouth daily with supper. 60 tablet 3  . simvastatin (ZOCOR) 20 MG tablet Take 1 tablet (20 mg total) by mouth at bedtime. 90 tablet 3   No current facility-administered medications for this visit.             Facility-Administered Medications Ordered in Other Visits  Medication Dose Route Frequency Provider Last Rate Last Dose  . heparin lock flush 100 unit/mL  500 Units Intravenous Once Brunetta Genera, MD      . sodium chloride 0.9 % injection 10 mL  10 mL Intravenous PRN Gautam Juleen China, MD             Allergies  Allergen Reactions  . Morphine And Related     Shuts bladder down.  Marland Kitchen Prochlorperazine Other (See  Comments)    Heart arrythmia      Review of Systems:              General:                      normal appetite, normal energy, no weight gain, no weight loss, no fever             Cardiac:                       no chest pain with exertion, no chest pain at rest, + SOB with more strenuous exertion, no resting SOB, no PND, no orthopnea, no palpitations, no arrhythmia, no atrial fibrillation, no LE edema, + dizzy spells, no syncope             Respiratory:                 + mild exertional shortness of breath, no home oxygen, no productive cough, no dry cough, no bronchitis, no wheezing, no hemoptysis, + cold weather induced asthma, no pain with inspiration or cough, no sleep apnea, no CPAP at night             GI:                               no difficulty swallowing, + reflux, NO frequent heartburn, + hiatal hernia, no abdominal pain, + constipation, no diarrhea, no hematochezia, no hematemesis, no melena             GU:                              no dysuria,  + frequency, no urinary tract infection, + hematuria, no enlarged prostate, + kidney stones, no kidney disease             Vascular:  no pain suggestive of claudication, no pain in feet, no leg cramps, no varicose veins, no DVT, no non-healing foot ulcer             Neuro:                         no stroke, no TIA's, no seizures, no headaches, no temporary blindness one eye,  no slurred speech, no peripheral neuropathy, no chronic pain, no instability of gait, no memory/cognitive dysfunction             Musculoskeletal:         no arthritis, no joint swelling, no myalgias, no difficulty walking, normal mobility              Skin:                            no rash, no itching, no skin infections, no pressure sores or ulcerations             Psych:                         no anxiety, no depression, no nervousness, no unusual recent stress             Eyes:                           no blurry vision, no floaters, no  recent vision changes, + wears glasses only for reading             ENT:                            no hearing loss, no loose or painful teeth, partial upper dentures, last saw dentist many years ago             Hematologic:               no easy bruising, no abnormal bleeding, no clotting disorder, no frequent epistaxis             Endocrine:                   + diabetes, does check CBG's at home                           Physical Exam:              BP 120/74   Pulse 68   Resp 20   Ht '5\' 8"'$  (1.727 m)   Wt 200 lb (90.7 kg)   SpO2 97% Comment: RA  BMI 30.41 kg/m              General:                        well-appearing             HEENT:                       Unremarkable              Neck:                           no  JVD, no bruits, no adenopathy              Chest:                          clear to auscultation, symmetrical breath sounds, no wheezes, no rhonchi              CV:                              RRR, grade III/VI holosystolic murmur              Abdomen:                    soft, non-tender, no masses              Extremities:                 warm, well-perfused, pulses palpable, no LE edema             Rectal/GU                   Deferred             Neuro:                         Grossly non-focal and symmetrical throughout             Skin:                            Clean and dry, no rashes, no breakdown   Diagnostic Tests:  Echocardiography  Patient: David Carr, David Carr MR #: [id number] Study Date: 12/22/2015 Gender: M Age: 16 Height: 169.5 cm Weight: 95.5 kg BSA: 2.16 m^2 Pt. Status: Room:  ATTENDING Minus Breeding, MD ORDERING Minus Breeding, MD [place], MD SONOGRAPHER Cindy Hazy, RDCS PERFORMING [facility], Outpatient  cc:  ------------------------------------------------------------------- LV EF: 55% -  60%  ------------------------------------------------------------------- Indications: I34.0 Mitral Regurgitation.  ------------------------------------------------------------------- History: PMH: Acquired from the patient and from the patient&'s chart. PMH: History of Acute Myeloid Leukemia. Mitral regurgitation. Risk factors: Hypertension. Diabetes mellitus. Dyslipidemia.  ------------------------------------------------------------------- Study Conclusions  - Left ventricle: The cavity size was normal. Systolic function was normal. The estimated ejection fraction was in the range of 55% to 60%. Doppler parameters are consistent with abnormal left ventricular relaxation (grade 1 diastolic dysfunction). - Mitral valve: Moderate prolapse, involving the posterior leaflet. Probable flail motion involving the posterior leaflet. There was mild regurgitation. There appears to be a partial flail of the posterior MY leaflet. Visually MR appears mild at worst but density of signal on CW Doppler suggests much more severe MR. Would suggest TEE for further evaluation. Reviewed with Dr. Aundra Dubin.  ------------------------------------------------------------------- Study data: Study status: Routine. Procedure: The patient reported no pain pre or post test. Transthoracic echocardiography for left ventricular function evaluation, for right ventricular function evaluation, and for assessment of valvular function. Image quality was adequate. Study completion: There were no complications. Echocardiography. M-mode, complete 2D, spectral Doppler, and color Doppler. Birthdate: Patient birthdate: 04-17-1945. Age: Patient is 71 yr old. Sex: Gender: male. BMI: 33.2 kg/m^2. Blood pressure: 120/70 Patient status: Outpatient. Study date: Study date: 12/22/2015. Study time: 02:03 PM. Location: [facility]  3  -------------------------------------------------------------------  ------------------------------------------------------------------- Left ventricle: The cavity size was normal. Systolic function was  normal. The estimated ejection fraction was in the range of 55% to 60%. Doppler parameters are consistent with abnormal left ventricular relaxation (grade 1 diastolic dysfunction).  ------------------------------------------------------------------- Aortic valve: Structurally normal valve. Cusp separation was normal. Doppler: Transvalvular velocity was within the normal range. There was no stenosis. There was no regurgitation.  ------------------------------------------------------------------- Aorta: Aortic root: The aortic root was normal in size. Ascending aorta: The ascending aorta was normal in size.  ------------------------------------------------------------------- Mitral valve: Moderate prolapse, involving the posterior leaflet. Probable flail motion involving the posterior leaflet. There appears to be a partial flail of the posterior MY leaflet. Visually MR appears mild at worst but density of signal on CW Doppler suggests much more severe MR. Would suggest TEE for further evaluation. Reviewed with Dr. Aundra Dubin. Doppler: There was mild regurgitation. Peak gradient (D): 3 mm Hg.  ------------------------------------------------------------------- Left atrium: The atrium was normal in size.  ------------------------------------------------------------------- Right ventricle: The cavity size was normal. Wall thickness was normal. Systolic function was normal.  ------------------------------------------------------------------- Pulmonic valve: Structurally normal valve. Cusp separation was normal. Doppler: Transvalvular velocity was within the normal range. There was no  regurgitation.  ------------------------------------------------------------------- Tricuspid valve: Structurally normal valve. Leaflet separation was normal. Doppler: Transvalvular velocity was within the normal range. There was trivial regurgitation.  ------------------------------------------------------------------- Right atrium: The atrium was normal in size.  ------------------------------------------------------------------- Pericardium: The pericardium was normal in appearance.  ------------------------------------------------------------------- Systemic veins: Inferior vena cava: The vessel was normal in size. The respirophasic diameter changes were in the normal range (= 50%), consistent with normal central venous pressure.  ------------------------------------------------------------------- Measurements  Left ventricle Value Reference LV ID, ED, PLAX chordal 51.4 mm 43 - 52 LV ID, ES, PLAX chordal 26.1 mm 23 - 38 LV fx shortening, PLAX chordal 49 % >=29 LV PW thickness, ED 11.5 mm --------- IVS/LV PW ratio, ED 0.99 <=1.3 Stroke volume, 2D 74 ml --------- Stroke volume/bsa, 2D 34 ml/m^2 --------- LV e&', lateral 9.54 cm/s --------- LV E/e&', lateral 8.86 --------- LV e&', medial 8.66 cm/s --------- LV E/e&', medial 9.76 --------- LV e&', average 9.1 cm/s --------- LV E/e&', average 9.29 ---------  Ventricular septum Value Reference IVS thickness, ED 11.4 mm ---------  LVOT Value  Reference LVOT ID, S 23 mm --------- LVOT area 4.15 cm^2 --------- LVOT ID 23 mm --------- LVOT peak velocity, S 97.8 cm/s --------- LVOT mean velocity, S 70 cm/s --------- LVOT VTI, S 17.9 cm --------- LVOT peak gradient, S 4 mm Hg --------- Stroke volume (SV), LVOT DP 74.4 ml --------- Stroke index (SV/bsa), LVOT DP 34.5 ml/m^2 ---------  Aorta Value Reference Aortic root ID, ED 38 mm --------- Ascending aorta ID, A-P, S 34 mm ---------  Left atrium Value Reference LA ID, A-P, ES 39 mm --------- LA ID/bsa, A-P 1.81 cm/m^2 <=2.2 LA volume, S 45 ml --------- LA volume/bsa, S 20.9 ml/m^2 --------- LA volume, ES, 1-p A4C 53 ml --------- LA volume/bsa, ES, 1-p A4C 24.6 ml/m^2 --------- LA volume, ES, 1-p A2C 38 ml --------- LA volume/bsa, ES, 1-p A2C 17.6 ml/m^2 ---------  Mitral valve Value Reference Mitral E-wave peak velocity 84.5 cm/s --------- Mitral A-wave peak velocity 90.5 cm/s --------- Mitral deceleration time 222 ms 150 - 230 Mitral peak gradient, D 3 mm Hg --------- Mitral E/A ratio, peak 0.9 ---------  Right ventricle Value Reference RV s&', lateral, S 17.5 cm/s ---------  Legend: (L) and (H) mark values outside specified reference  range.  ------------------------------------------------------------------- Prepared and Electronically Authenticated by  Pierre Bali, MD 2017-11-08T17:53:18   Transesophageal Echocardiography  Patient: David Carr  David Carr MR #: [id number] Study Date: 01/11/2016 Gender: M Age: 39 Height: 174 cm Weight: 95.3 kg BSA: 2.18 m^2 Pt. Status: Room:  ADMITTING Fransico Him, MD SONOGRAPHER Florentina Jenny, RDCS ATTENDING Lyman Bishop MD [place] MD PERFORMING Lyman Bishop MD REFERRING Lyman Bishop MD  cc:  ------------------------------------------------------------------- LV EF: 55% - 60%  ------------------------------------------------------------------- Indications: 424.0 Mitral valve disease.  ------------------------------------------------------------------- History: Risk factors: Hypertension. Diabetes mellitus. Dyslipidemia.  ------------------------------------------------------------------- Study Conclusions  - Left ventricle: The cavity size was normal. There was mild concentric hypertrophy. Systolic function was normal. The estimated ejection fraction was in the range of 55% to 60%. Wall motion was normal; there were no regional wall motion abnormalities. - Aortic valve: No evidence of vegetation. - Mitral valve: Mid to late systolic prolapse of the P2 segement of the mitral valve with a flail cord that prolapses into the left atrium. There is severe regurgitation and reversal of flow was noted in the LUPV. Regurgitant VTI: 159 cm. Effective regurgitant orifice (PISA): 0.5 cm^2. Regurgitant volume (PISA): 80 ml. - Left atrium: The atrium was dilated. No evidence of thrombus in the atrial cavity or appendage. - Pulmonary veins: Systolic flow reversal in the LUPV. - Right atrium: No evidence of thrombus in the atrial cavity or appendage. -  Atrial septum: There was a small patent foramen ovale with right to left flow after valsalva. The interatrial septum is hypermobile, but not quite aneurysmal.  Impressions:  - Severe MR associated with a flail P2 segment of the mitral valve. Small PFO noted with valsalva.  ------------------------------------------------------------------- Study data: Study status: Routine. Consent: The risks, benefits, and alternatives to the procedure were explained to the patient and informed consent was obtained. Procedure: Initial setup. The patient was brought to the laboratory. Surface ECG leads were monitored. Sedation. Conscious sedation was administered by cardiology staff. Transesophageal echocardiography. Topical anesthesia was obtained using viscous lidocaine. A transesophageal probe was inserted by the attending cardiologist. Image quality was adequate. Study completion: The patient tolerated the procedure well. There were no complications. Administered medications: Midazolam, '4mg'$ , IV. Fentanyl, 94mg, IV. Diagnostic transesophageal echocardiography. 2D and color Doppler. Birthdate: Patient birthdate: 011/14/1947 Age: Patient is 71yr old. Sex: Gender: male. BMI: 31.5 kg/m^2. Blood pressure: 130/73 Patient status: Outpatient. Study date: Study date: 01/11/2016. Study time: 09:44 AM. Location: Endoscopy.  -------------------------------------------------------------------  ------------------------------------------------------------------- Left ventricle: The cavity size was normal. There was mild concentric hypertrophy. Systolic function was normal. The estimated ejection fraction was in the range of 55% to 60%. Wall motion was normal; there were no regional wall motion abnormalities.  ------------------------------------------------------------------- Aortic valve: Structurally normal valve. Trileaflet. Cusp separation was normal.  No evidence of vegetation. Doppler: There was no regurgitation.  ------------------------------------------------------------------- Aorta: The aorta was normal, not dilated, and non-diseased.  ------------------------------------------------------------------- Mitral valve: Mid to late systolic prolapse of the P2 segement of the mitral valve with a flail cord that prolapses into the left atrium. There is severe regurgitation and reversal of flow was noted in the LUPV. Doppler: Peak gradient (D): 2 mm Hg.  ------------------------------------------------------------------- Left atrium: The atrium was dilated. No evidence of thrombus in the atrial cavity or appendage.  ------------------------------------------------------------------- Atrial septum: There was a small patent foramen ovale with right to left flow after valsalva. The interatrial septum is hypermobile, but not quite aneurysmal.  ------------------------------------------------------------------- Pulmonary veins: Systolic flow reversal in the LUPV.  ------------------------------------------------------------------- Right ventricle: The cavity size was normal. Wall thickness was normal. Systolic function was normal.  ------------------------------------------------------------------- Pulmonic valve: Doppler: There was  trivial regurgitation.  ------------------------------------------------------------------- Tricuspid valve: Doppler: There was trivial regurgitation.  ------------------------------------------------------------------- Pulmonary artery: The main pulmonary artery was normal-sized.  ------------------------------------------------------------------- Right atrium: The atrium was normal in size. No evidence of thrombus in the atrial cavity or appendage.  ------------------------------------------------------------------- Pericardium: There was no pericardial  effusion.  ------------------------------------------------------------------- Post procedure conclusions Ascending Aorta:  - The aorta was normal, not dilated, and non-diseased.  ------------------------------------------------------------------- Measurements  Aorta Value Reference Aortic arch ID, innominate-LCCA 28.16 mm 20 - 36  Mitral valve Value Reference Mitral E-wave peak velocity 74 cm/s --------- Mitral A-wave peak velocity 49.5 cm/s --------- Mitral peak gradient, D 2 mm Hg --------- Mitral E/A ratio, peak 1.5 --------- Mitral regurg VTI, PISA 159 cm --------- Mitral ERO, PISA 0.5 cm^2 --------- Mitral regurg volume, PISA 80 ml ---------  Legend: (L) and (H) mark values outside specified reference range.  ------------------------------------------------------------------- Prepared and Electronically Authenticated by  Zoila Shutter MD 2017-11-28T17:00:37   Impression:  Patient has mitral valve prolapse with stage D severe symptomatic primary mitral regurgitation.  I have personally reviewed the patient's recent transthoracic and transesophageal echocardiograms. 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 Left ventricular size and systolic function remained normal. The left atrium is dilated. The patient appears to be otherwise healthy although he has recently had problems with kidney stones requiring cystoscopy for extraction.  He has not seen a dentist in many years.   Plan:  The patient and his wife were counseled at length regarding the indications, risks and potential  benefits of mitral valve repair.  The rationale for elective surgery has been explained, including a comparison between surgery and continued medical therapy with close follow-up.  The likelihood of successful and durable valve repair has been discussed with particular reference to the findings of their recent echocardiogram.  Based upon these findings and previous experience, I have quoted them a greater than 95 percent likelihood of successful valve repair. The patient understands and accepts all potential risks of surgery including but not limited to risk of death, stroke or other neurologic complication, myocardial infarction, congestive heart failure, respiratory failure, renal failure, bleeding requiring transfusion and/or reexploration, arrhythmia, infection or other wound complications, pneumonia, pleural and/or pericardial effusion, pulmonary embolus, aortic dissection or other major vascular complication, or delayed complications related to valve repair or replacement including but not limited to structural valve deterioration and failure, thrombosis, embolization, endocarditis, or paravalvular leak.  Alternative surgical approaches have been discussed including a comparison between conventional sternotomy and minimally-invasive techniques.  The relative risks and benefits of each have been reviewed as they pertain to the patient's specific circumstances, and all of their questions have been addressed.  Specific risks potentially related to the minimally-invasive approach were discussed at length, including but not limited to risk of conversion to full or partial sternotomy, aortic dissection or other major vascular complication, unilateral acute lung injury or pulmonary edema, phrenic nerve dysfunction or paralysis, rib fracture, chronic pain, lung hernia, or lymphocele. All of their questions have been answered.  The patient hopes to proceed with surgery in the near future.  As a next step the patient  will need to keep his follow-up appointment with Dr. Retta Diones in urology to make sure that there is no sign of persistent or recurrent infection or retained kidney stone. He needs dental consultation for cleaning and routine exam. He will need to undergo  diagnostic cardiac catheterization. Finally, we will plan CT angiography of the aorta and iliac vessels to evaluate the feasibility of peripheral arterial cannulation for surgery. We tentatively plan to proceed with surgery on 06/21/2016.  The patient will return to our office for follow-up on 06/19/2016.    I spent in excess of 90 minutes during the conduct of this office consultation and >50% of this time involved direct face-to-face encounter with the patient for counseling and/or coordination of their care.    Valentina Gu. Roxy Manns, MD 05/29/2016 1:09 PM

## 2016-06-08 NOTE — Op Note (Signed)
OPERATIVE REPORT  Patient:            David Carr. Date of Birth:  29-Mar-1945 MRN:                [id number]   DATE OF PROCEDURE:  06/08/2016  PREOPERATIVE DIAGNOSES: 1. Severe mitral regurgitation 2. Pre-heart valve surgery dental protocol 3. Chronic apical periodontitis 4. Multiple retained root segments 5. Dental caries 6. Bilateral mandibular lingual tori 7. Bilateral maxillary buccal exostoses   POSTOPERATIVE DIAGNOSES: 1. Severe mitral regurgitation 2. Pre-heart valve surgery dental protocol 3. Chronic apical periodontitis 4. Multiple retained root segments 5. Dental caries 6. Bilateral mandibular lingual tori 7. Bilateral maxillary buccal exostoses   OPERATIONS: 1. Multiple extraction of tooth numbers 2, 4,6-9, 13,15,16, 19, 21-29, and 32. 2. 4 Quadrants of alveoloplasty 3. Bilateral maxillary buccal exostoses reductions 4. Bilateral mandibular lingual tori reductions   SURGEON: Lenn Cal, DDS  ASSISTANT: Camie Patience, (dental assistant)  ANESTHESIA: General anesthesia via nasoendotracheal tube.  MEDICATIONS: 1. Ancef 2 g IV prior to invasive dental procedures. 2. Local anesthesia with a total utilization of 6 carpules each containing 34 mg of lidocaine with 0.017 mg of epinephrine as well as 2 carpules each containing 9 mg of bupivacaine with 0.009 mg of epinephrine.  SPECIMENS: There are 20 teeth that were discarded.  DRAINS: None  CULTURES: None  COMPLICATIONS: None   ESTIMATED BLOOD LOSS: 100 mLs.  INTRAVENOUS FLUIDS: 1100 mLs of Lactated ringers solution.  INDICATIONS: The patient was recently diagnosed with severe mitral regurgitation.  A medically necessary dental consultation was then requested to evaluate poor dentition prior to anticipated heart valve surgery.  The patient was examined and treatment planned for extraction remaining teeth with alveoloplasty and pre-prosthetic surgery as needed in the operating room with  general anesthesia.  This treatment plan was formulated to decrease the risks and complications associated with dental infection from affecting the patient's systemic health and the anticipated heart valve surgery.  OPERATIVE FINDINGS: Patient was examined operating room number 9.  The teeth were identified for extraction. The patient was noted be affected by chronic apical periodontitis, dental caries, multiple retained root segments, bilateral mandibular lingual tori, bilateral maxillary buccal exostoses, and chronic periodontitis.   DESCRIPTION OF PROCEDURE: Patient was brought to the main operating room number 9. Patient was then placed in the supine position on the operating table. General anesthesia was then induced per the anesthesia team. The patient was then prepped and draped in the usual manner for dental medicine procedure. A timeout was performed. The patient was identified and procedures were verified. A throat pack was placed at this time. The oral cavity was then thoroughly examined with the findings noted above. The patient was then ready for dental medicine procedure as follows:  Local anesthesia was then administered sequentially with a total utilization of 6 carpules each containing 34 mg of lidocaine with 0.017 mg of epinephrine as well as 2 carpules  each containing 9 mg bupivacaine with 0.009 mg of epinephrine.  The Maxillary left and right quadrants were first approached. Anesthesia was then delivered utilizing infiltration with lidocaine with epinephrine. A #15 blade incision was then made from the maxillary right tuberosity and extended to the maxillary left tuberosity.  A  surgical flap was then carefully reflected. The maxillary teeth were then subluxated with a series of straight elevators. Appropriate amounts of buccal and interseptal bone were then removed utilizing a surgical handpiece and bur and copious amounts of sterile  water around tooth numbers 2, 4, 6, 13, 15, and  16.  The teeth were then again subluxated with a series of straight elevators. Tooth numbers 2,4,6,7,8,9,13,15,16, were then removed with a 150 forceps without complications. Alveoloplasty was then performed utilizing a ronguers and bone file. At this point time the bilateral maxillary buccal exostoses in the area of 1-4 and 13 -16 were then reduced utilizing a surgical handpiece and bur and copious amounts of sterile water. This was followed by further alveoloplasty with a rongeurs and bone file appropriately. The surgical site was then irrigated with copious amounts of sterile saline. The tissues were approximated and trimmed appropriately. A piece of Surgifoam was placed in the maxillary left and maxillary right molar extraction sites. The surgical site was then closed from the maxillary right tuberosity and extended the mesial #8 utilizing 3-0 chromic gut suture in a continuous interrupted suture technique 1. The maxillary left surgical site was then closed from the maxillary left tuberosity and extended the mesial #9 utilizing 3-0 chromic gut suture in a continuous interrupted suture technique 1.  At this point time, the mandibular quadrants were approached. The patient was given bilateral inferior alveolar nerve blocks and long buccal nerve blocks utilizing the bupivacaine with epinephrine. Further infiltration was then achieved utilizing the lidocaine with epinephrine. A 15 blade incision was then made from the distal of number 17 and extended to the distal of #32.  A surgical flap was then carefully reflected. The lower teeth were then subluxated with a series straight elevators. Appropriate amounts of buccal and interseptal bone were then removed utilizing a surgical handpiece and copious amount of sterile water around tooth numbers 19, 21, 22, 27, 28, 29, and 32.  The lower teeth were then again subluxated with a series straight elevators. Tooth #19 was then removed with a 23 forceps without  complications. Tooth numbers 21-29 and retained root segments in the area #32 were then removed with a 151 forceps without complications. Alveoloplasty was then performed utilizing a rongeurs and bone file. At this point on the lingual flaps were then further reflected to expose the bilateral mandibular lingual tori. These lingual tori were then reduced utilizing a surgical handpiece and bur and copious amounts sterile water. Further alveoloplasty was then  performed utilizing a rongeurs and bone file. The tissues were approximated and trimmed appropriately. The surgical sites were then irrigated with copious amounts of sterile saline 4. A piece of Surgifoam was placed in the molar extraction site area #19 and 32. The mandibular left surgical site was then closed from the distal of  17 and extended the mesial #24 utilizing 3-0 chromic gut suture in a continuous interrupted suture technique 1. The mandibular right surgical site was then closed from the distal of #32 and extended the mesial #25 utilizing 3-0 chromic gut suture in a continuous interrupted suture technique 1.  At this point time, the entire mouth was irrigated with copious amounts of sterile saline. The patient was examined for complications, seeing none, the dental medicine procedure was deemed to be complete. The throat pack was removed at this time. An oral airway was then placed at the request of the anesthesia team. A series of 4 x 4 gauze moistened with Amicar 5% rinse were placed in the mouth to aid hemostasis. The patient was then handed over to the anesthesia team for final disposition. After an appropriate amount of time, the patient was extubated and taken to the postanesthsia care unit in good condition. All  counts were correct for the dental medicine procedure. The patient is to continue the Amicar 5% rinse post operatively. Patient is to rinse with 10 ML's every hour for the next 10 hours in a swish and spit manner. Patient is to use  the Percocet 5/325 pain medication postoperatively. Patient is to take one to 2 tablets every 6 hours as needed for moderate to severe pain.  Patient is to proceed with anticipated heart valve surgery after adequate healing at the discretion of Dr. Roxy Manns.   Lenn Cal, DDS.

## 2016-06-08 NOTE — Anesthesia Postprocedure Evaluation (Addendum)
Anesthesia Post Note  Patient: David Carr.  Procedure(s) Performed: Procedure(s) (LRB): MULTIPLE EXTRACTION WITH ALVEOLOPLASTY AND PRE-PROSTHETIC SURGERY AS NEEDED (N/A)  Patient location during evaluation: PACU Anesthesia Type: General Level of consciousness: awake and alert Pain management: pain level controlled Vital Signs Assessment: post-procedure vital signs reviewed and stable Respiratory status: spontaneous breathing, nonlabored ventilation, respiratory function stable and patient connected to nasal cannula oxygen Cardiovascular status: blood pressure returned to baseline and stable Postop Assessment: no signs of nausea or vomiting Anesthetic complications: no       Last Vitals:  Vitals:   06/08/16 1100 06/08/16 1110  BP: 125/74 130/74  Pulse: 81 85  Resp: 11 16  Temp: 36.6 C     Last Pain:  Vitals:   06/08/16 1100  TempSrc:   PainSc: 3                  Teresea Donley

## 2016-06-08 NOTE — Anesthesia Preprocedure Evaluation (Addendum)
Anesthesia Evaluation  Patient identified by MRN, date of birth, ID band Patient awake    Reviewed: Allergy & Precautions, NPO status , Patient's Chart, lab work & pertinent test results, reviewed documented beta blocker date and time   History of Anesthesia Complications Negative for: history of anesthetic complications  Airway Mallampati: II  TM Distance: >3 FB Neck ROM: Full    Dental  (+) Poor Dentition, Dental Advisory Given   Pulmonary former smoker,    breath sounds clear to auscultation       Cardiovascular hypertension, Pt. on medications and Pt. on home beta blockers (-) angina+ CAD  + Valvular Problems/Murmurs MR  Rhythm:Regular + Systolic murmurs    Neuro/Psych negative neurological ROS  negative psych ROS   GI/Hepatic GERD  Controlled,  Endo/Other  diabetes, Type 2, Insulin DependentMorbid obesity  Renal/GU      Musculoskeletal   Abdominal   Peds  Hematology   Anesthesia Other Findings   Reproductive/Obstetrics                            Anesthesia Physical Anesthesia Plan  ASA: III  Anesthesia Plan: General   Post-op Pain Management:    Induction: Intravenous  Airway Management Planned: Nasal ETT  Additional Equipment: None  Intra-op Plan:   Post-operative Plan: Extubation in OR  Informed Consent: I have reviewed the patients History and Physical, chart, labs and discussed the procedure including the risks, benefits and alternatives for the proposed anesthesia with the patient or authorized representative who has indicated his/her understanding and acceptance.   Dental advisory given  Plan Discussed with: Surgeon and CRNA  Anesthesia Plan Comments:         Anesthesia Quick Evaluation

## 2016-06-09 ENCOUNTER — Encounter (HOSPITAL_COMMUNITY): Payer: Self-pay | Admitting: Dentistry

## 2016-06-16 ENCOUNTER — Ambulatory Visit
Admission: RE | Admit: 2016-06-16 | Discharge: 2016-06-16 | Disposition: A | Discharge status: Home / Self Care | Payer: 59 | Source: Ambulatory Visit | Attending: Thoracic Surgery (Cardiothoracic Vascular Surgery) | Admitting: Thoracic Surgery (Cardiothoracic Vascular Surgery)

## 2016-06-16 DIAGNOSIS — Z01818 Encounter for other preprocedural examination: Secondary | ICD-10-CM

## 2016-06-16 DIAGNOSIS — I059 Rheumatic mitral valve disease, unspecified: Secondary | ICD-10-CM

## 2016-06-16 DIAGNOSIS — I7101 Dissection of thoracic aorta: Secondary | ICD-10-CM

## 2016-06-16 DIAGNOSIS — I71019 Dissection of thoracic aorta, unspecified: Secondary | ICD-10-CM

## 2016-06-16 DIAGNOSIS — I7409 Other arterial embolism and thrombosis of abdominal aorta: Secondary | ICD-10-CM

## 2016-06-16 MED ORDER — IOPAMIDOL (ISOVUE-370) INJECTION 76%
75.0000 mL | Freq: Once | INTRAVENOUS | Status: AC | PRN
  Administered 2016-06-16: 75 mL via INTRAVENOUS

## 2016-06-16 NOTE — Pre-Procedure Instructions (Signed)
    Theophilus Bones [name]  06/16/2016      [facility] - [place], [place] [place] [address] [place] [address] Phone: [phone] Fax: [phone]  [id number] - [place], [place] [address] [place] [address] Phone: [phone] Fax: [phone]    Your procedure is scheduled on 06/21/16.  Report to [facility] Admitting at 630 A.M.  Call this number if you have problems the morning of surgery:  [phone]   Remember:  Do not eat food or drink liquids after midnight.  Take these medicines the morning of surgery with A SIP OF WATER --atenolol,oxycodone   Do not wear jewelry, make-up or nail polish.  Do not wear lotions, powders, or perfumes, or deoderant.  Do not shave 48 hours prior to surgery.  Men may shave face and neck.  Do not bring valuables to the hospital.  [facility] is not responsible for any belongings or valuables.  Contacts, dentures or bridgework may not be worn into surgery.  Leave your suitcase in the car.  After surgery it may be brought to your room.  For patients admitted to the hospital, discharge time will be determined by your treatment team.  Patients discharged the day of surgery will not be allowed to drive home.   Name and phone number of your driver:    Special instructions: Do not take any aspirin,anti-inflammatories,vitamins,or herbal supplements 5-7 days prior to surgery. Please read over the following fact sheets that you were given. MRSA Information

## 2016-06-19 ENCOUNTER — Telehealth: Payer: Self-pay | Admitting: Internal Medicine

## 2016-06-19 ENCOUNTER — Encounter (HOSPITAL_COMMUNITY)
Admission: RE | Admit: 2016-06-19 | Discharge: 2016-06-19 | Disposition: A | Discharge status: Home / Self Care | Payer: 59 | Source: Ambulatory Visit | Attending: Thoracic Surgery (Cardiothoracic Vascular Surgery) | Admitting: Thoracic Surgery (Cardiothoracic Vascular Surgery)

## 2016-06-19 ENCOUNTER — Ambulatory Visit (HOSPITAL_BASED_OUTPATIENT_CLINIC_OR_DEPARTMENT_OTHER)
Admission: RE | Admit: 2016-06-19 | Discharge: 2016-06-19 | Disposition: A | Discharge status: Home / Self Care | Payer: 59 | Source: Ambulatory Visit | Attending: Thoracic Surgery (Cardiothoracic Vascular Surgery) | Admitting: Thoracic Surgery (Cardiothoracic Vascular Surgery)

## 2016-06-19 ENCOUNTER — Encounter (HOSPITAL_COMMUNITY): Payer: Self-pay | Admitting: Dentistry

## 2016-06-19 ENCOUNTER — Encounter (HOSPITAL_COMMUNITY): Payer: Self-pay

## 2016-06-19 ENCOUNTER — Encounter: Payer: Self-pay | Admitting: Thoracic Surgery (Cardiothoracic Vascular Surgery)

## 2016-06-19 ENCOUNTER — Ambulatory Visit (HOSPITAL_COMMUNITY)
Admission: RE | Admit: 2016-06-19 | Discharge: 2016-06-19 | Disposition: A | Discharge status: Home / Self Care | Payer: 59 | Source: Ambulatory Visit | Attending: Thoracic Surgery (Cardiothoracic Vascular Surgery) | Admitting: Thoracic Surgery (Cardiothoracic Vascular Surgery)

## 2016-06-19 ENCOUNTER — Ambulatory Visit (INDEPENDENT_AMBULATORY_CARE_PROVIDER_SITE_OTHER): Payer: 59 | Admitting: Thoracic Surgery (Cardiothoracic Vascular Surgery)

## 2016-06-19 ENCOUNTER — Ambulatory Visit (HOSPITAL_COMMUNITY): Payer: Self-pay | Admitting: Dentistry

## 2016-06-19 VITALS — BP 140/74 | HR 67 | Temp 97.9°F

## 2016-06-19 VITALS — BP 140/80 | HR 80 | Resp 20 | Ht 68.0 in | Wt 195.0 lb

## 2016-06-19 DIAGNOSIS — I080 Rheumatic disorders of both mitral and aortic valves: Secondary | ICD-10-CM

## 2016-06-19 DIAGNOSIS — K08109 Complete loss of teeth, unspecified cause, unspecified class: Secondary | ICD-10-CM

## 2016-06-19 DIAGNOSIS — I340 Nonrheumatic mitral (valve) insufficiency: Secondary | ICD-10-CM

## 2016-06-19 DIAGNOSIS — K0820 Unspecified atrophy of edentulous alveolar ridge: Secondary | ICD-10-CM

## 2016-06-19 DIAGNOSIS — K08199 Complete loss of teeth due to other specified cause, unspecified class: Secondary | ICD-10-CM

## 2016-06-19 DIAGNOSIS — Z01818 Encounter for other preprocedural examination: Secondary | ICD-10-CM

## 2016-06-19 LAB — PULMONARY FUNCTION TEST
DL/VA % PRED: 77 %
DL/VA: 3.48 ml/min/mmHg/L
DLCO cor % pred: 62 %
DLCO cor: 18.8 ml/min/mmHg
DLCO unc % pred: 60 %
DLCO unc: 18.41 ml/min/mmHg
FEF 25-75 Post: 2.18 L/sec
FEF 25-75 Pre: 1.86 L/sec
FEF2575-%Change-Post: 17 %
FEF2575-%PRED-POST: 99 %
FEF2575-%Pred-Pre: 85 %
FEV1-%CHANGE-POST: 3 %
FEV1-%Pred-Post: 95 %
FEV1-%Pred-Pre: 92 %
FEV1-PRE: 2.72 L
FEV1-Post: 2.83 L
FEV1FVC-%CHANGE-POST: 0 %
FEV1FVC-%Pred-Pre: 99 %
FEV6-%Change-Post: 2 %
FEV6-%PRED-POST: 98 %
FEV6-%Pred-Pre: 95 %
FEV6-PRE: 3.65 L
FEV6-Post: 3.75 L
FEV6FVC-%Change-Post: 0 %
FEV6FVC-%PRED-PRE: 103 %
FEV6FVC-%Pred-Post: 103 %
FVC-%CHANGE-POST: 3 %
FVC-%Pred-Post: 95 %
FVC-%Pred-Pre: 91 %
FVC-Post: 3.87 L
FVC-Pre: 3.74 L
POST FEV1/FVC RATIO: 73 %
PRE FEV6/FVC RATIO: 98 %
Post FEV6/FVC ratio: 97 %
Pre FEV1/FVC ratio: 73 %
RV % PRED: 134 %
RV: 3.27 L
TLC % pred: 104 %
TLC: 7.01 L

## 2016-06-19 LAB — COMPREHENSIVE METABOLIC PANEL
ALK PHOS: 69 U/L (ref 38–126)
ALT: 17 U/L (ref 17–63)
ANION GAP: 11 (ref 5–15)
AST: 26 U/L (ref 15–41)
Albumin: 3.8 g/dL (ref 3.5–5.0)
BILIRUBIN TOTAL: 1 mg/dL (ref 0.3–1.2)
BUN: 8 mg/dL (ref 6–20)
CALCIUM: 9.2 mg/dL (ref 8.9–10.3)
CO2: 22 mmol/L (ref 22–32)
Chloride: 104 mmol/L (ref 101–111)
Creatinine, Ser: 0.95 mg/dL (ref 0.61–1.24)
GLUCOSE: 232 mg/dL — AB (ref 65–99)
Potassium: 4 mmol/L (ref 3.5–5.1)
Sodium: 137 mmol/L (ref 135–145)
TOTAL PROTEIN: 6.8 g/dL (ref 6.5–8.1)

## 2016-06-19 LAB — CBC
HEMATOCRIT: 40.8 % (ref 39.0–52.0)
HEMOGLOBIN: 13.9 g/dL (ref 13.0–17.0)
MCH: 31.9 pg (ref 26.0–34.0)
MCHC: 34.1 g/dL (ref 30.0–36.0)
MCV: 93.6 fL (ref 78.0–100.0)
Platelets: 147 10*3/uL — ABNORMAL LOW (ref 150–400)
RBC: 4.36 MIL/uL (ref 4.22–5.81)
RDW: 14.5 % (ref 11.5–15.5)
WBC: 5.5 10*3/uL (ref 4.0–10.5)

## 2016-06-19 LAB — VAS US DOPPLER PRE CABG
LCCADSYS: 73 cm/s
LEFT ECA DIAS: -12 cm/s
LEFT VERTEBRAL DIAS: -17 cm/s
Left CCA dist dias: 14 cm/s
Left CCA prox dias: 17 cm/s
Left CCA prox sys: 87 cm/s
Left ICA dist dias: -22 cm/s
Left ICA dist sys: -75 cm/s
Left ICA prox dias: 17 cm/s
Left ICA prox sys: 55 cm/s
RCCAPDIAS: 14 cm/s
RIGHT ECA DIAS: -11 cm/s
RIGHT VERTEBRAL DIAS: 6 cm/s
Right CCA prox sys: 84 cm/s
Right cca dist sys: -93 cm/s

## 2016-06-19 LAB — URINALYSIS, ROUTINE W REFLEX MICROSCOPIC
Bilirubin Urine: NEGATIVE
Glucose, UA: NEGATIVE mg/dL
HGB URINE DIPSTICK: NEGATIVE
Ketones, ur: NEGATIVE mg/dL
Leukocytes, UA: NEGATIVE
Nitrite: NEGATIVE
PH: 5 (ref 5.0–8.0)
Protein, ur: NEGATIVE mg/dL
SPECIFIC GRAVITY, URINE: 1.019 (ref 1.005–1.030)

## 2016-06-19 LAB — PROTIME-INR
INR: 1.02
PROTHROMBIN TIME: 13.4 s (ref 11.4–15.2)

## 2016-06-19 LAB — SURGICAL PCR SCREEN
MRSA, PCR: NEGATIVE
Staphylococcus aureus: NEGATIVE

## 2016-06-19 LAB — GLUCOSE, CAPILLARY: GLUCOSE-CAPILLARY: 161 mg/dL — AB (ref 65–99)

## 2016-06-19 LAB — APTT: aPTT: 32 seconds (ref 24–36)

## 2016-06-19 LAB — ABO/RH: ABO/RH(D): A POS

## 2016-06-19 MED ORDER — ALBUTEROL SULFATE (2.5 MG/3ML) 0.083% IN NEBU
2.5000 mg | INHALATION_SOLUTION | Freq: Once | RESPIRATORY_TRACT | Status: AC
  Administered 2016-06-19: 2.5 mg via RESPIRATORY_TRACT

## 2016-06-19 MED ORDER — CHLORHEXIDINE GLUCONATE 0.12 % MT SOLN: OROMUCOSAL | 99 refills | Status: DC

## 2016-06-19 NOTE — Patient Instructions (Signed)
PLAN: 1.  Use chlorhexidine rinses twice daily in a swish and spit manner. Use after breakfast and at bedtime. 2.  Continue salt water rinses every 2 hours while awake in between the chlorhexidine rinses.  3.  Advance diet as tolerated but continue soft diet only with nutritional supplementation as needed. 4.  Continue sinus precautions as previously provided postoperatively. 5.  Patient is cleared for heart valve surgery from a dental standpoint. Dr. Roxy Manns to decide on scheduling of the mitral valve heart surgery at his discretion.   Lenn Cal, DDS   SINUS PRECAUTIONS AFTER ORAL SURGERY  AVOID   Blowing your nose  It is best to wipe away nasal secretions carefully. After 2 weeks, if you must blow  your nose, blow gently through both sides at the same time. Do not pinch your  nose; do not blow just one side at a time.   Sneezing  If you must sneeze, keep your mouth open and do not pinch your nose closed.   Sucking  Do not drink through a straw. Do not smoke.    Blowing  Do not play a wind instrument. Do not blow a balloons.   Pushing or lifting  Do not lift or push objects weighing more than 20 pounds.   Bending over  Keeping her head above the level of your heart. Sleep with your head slightly raised.   Notify your dentist if you bleed from your nose. If you see bleeding from your nose, have neck stiffness, or increased sensitivity to bright light or severe headache, call Dental Medicine or proceed to the Emergency Department for evaluation immediately.  Notify your dentist if you are unable to take any of your medications as prescribed. You may be advised to take an antibiotic and decongestant as well as your regular pain medication. You must take these medications as prescribed. Do not stop taking them on her own. If you have a problem with any medication, please call us so that we can make an adjustment for you.  Contacts: 1. Dental Medicine:  [phone] 6. [facility]: [phone]

## 2016-06-19 NOTE — Progress Notes (Signed)
POST OPERATIVE NOTE:   06/19/2016 Rolland Steinert [name] [id number]  VITALS: BP 140/74 (BP Location: Left Arm)   Pulse 67   Temp 97.9 F (36.6 C) (Oral)   LABS:  Lab Results  Component Value Date   WBC 5.9 06/01/2016   HGB 14.9 06/01/2016   HCT 43.3 06/01/2016   MCV 94.4 06/01/2016   PLT 149 06/01/2016   BMET    Component Value Date/Time   NA 140 06/07/2016 1004   NA 138 12/02/2015 1335   K 4.1 06/07/2016 1004   K 4.1 12/02/2015 1335   CL 107 06/07/2016 1004   CO2 26 06/07/2016 1004   CO2 24 12/02/2015 1335   GLUCOSE 160 (H) 06/07/2016 1004   GLUCOSE 252 (H) 12/02/2015 1335   BUN 12 06/07/2016 1004   BUN 14.2 12/02/2015 1335   CREATININE 0.93 06/07/2016 1004   CREATININE 0.90 01/03/2016 1056   CREATININE 1.0 12/02/2015 1335   CALCIUM 9.2 06/07/2016 1004   CALCIUM 9.0 12/02/2015 1335   GFRNONAA >60 06/07/2016 1004   GFRAA >60 06/07/2016 1004    Lab Results  Component Value Date   INR 0.99 06/07/2016   INR 1.0 01/03/2016   INR 1.03 02/09/2015   No results found for: PTT   Theophilus Bones [name] is status post extraction of remaining teeth with alveoloplasty and pre-prosthetic surgery as needed in the operating room on 06/08/2016. Patient now presents for evaluation of healing and suture removal.  SUBJECTIVE: Patient with minimal complaints. Patient indicates that several areas are tender. Patient has several stitches that remain.  EXAM: There is no sign of infection, heme, or ooze. Sutures are loosely intact. Patient is healing in by generalized primary closure and several areas of healing in by secondary intention Involving the upper right and upper left molar areas.  Valsalva maneuver was negative upon evaluation of upper right and upper left molar extraction sites.  There is an area of exposed bone involving the lower right lingual aspect area #32. This measures approximately 4 mm around. No further pre-prosthetic surgery is needed at this  time.  PROCEDURE: The patient was given a chlorhexidine gluconate rinse for 30 seconds. Sutures were then removed without complication. Patient tolerated the procedure well.  ASSESSMENT: 1. Post operative course is consistent with dental procedures performed in the operating room with general anesthesia 2. Loss of teeth due to extraction 3. Patient is completely edentulous 4. There is atrophy of the edentulous alveolar ridges. 5. There is an area of exposed bone involving the lower right lingual aspect that measures approximately 4 mm round.   PLAN: 1.  Use chlorhexidine rinses twice daily in a swish and spit manner. Use after breakfast and at bedtime. 2.  Continue salt water rinses every 2 hours while awake in between the chlorhexidine rinses.  3.  Advance diet as tolerated but continue soft diet only with nutritional supplementation as needed. 4.  Continue sinus precautions as previously provided postoperatively. 5.  Patient is cleared for heart valve surgery from a dental standpoint. Dr. Roxy Manns to decide on scheduling of the mitral valve heart surgery at his discretion.   Lenn Cal, DDS

## 2016-06-19 NOTE — Patient Instructions (Addendum)
Stop taking aspirin.  Do not take your metformin tomorrow night and decrease your normal insulin dose by 1/2  Continue taking all other medications without change through the day before surgery.  Have nothing to eat or drink after midnight the night before surgery.  On the morning of surgery take only Atenolol with a sip of water.

## 2016-06-19 NOTE — Telephone Encounter (Signed)
Wanted to let you know. Thank you!

## 2016-06-19 NOTE — Progress Notes (Signed)
Pre-op Cardiac Surgery  Carotid Findings:  1-39% ICA stenosis.  Vertebral artery flow is antegrade.   Upper Extremity Right Left  Brachial Pressures 133T 130T  Radial Waveforms T T  Ulnar Waveforms T T  Palmar Arch (Allen's Test) Doppler signal remains normal with radial compression and obliterates with ulnar compression. WNL   Findings:      Lower  Extremity Right Left  Dorsalis Pedis    Anterior Tibial    Posterior Tibial    Ankle/Brachial Indices      Findings:

## 2016-06-19 NOTE — Progress Notes (Signed)
[address]       [place] [address]             [phone]     CARDIOTHORACIC SURGERY OFFICE NOTE  Referring Provider is Minus Breeding, MD PCP is Dorena Cookey, MD   HPI:  Patient returns to the office today for follow-up of mitral valve prolapse with stage D severe symptomatic primary mitral regurgitation.  He was originally seen in consultation on 05/29/2016. Since then he has been seen in follow-up by his urologist and cleared for surgery. He was seen in consultation by Dr. Enrique Sack and underwent dental extraction 06/08/2016. He also underwent diagnostic cardiac catheterization and CT angiography. He returns for office today with tentative plans to proceed with surgery on 06/21/2016. He reports no new problems or complaints.   Current Outpatient Prescriptions  Medication Sig Dispense Refill  . aspirin 81 MG tablet Take 81 mg by mouth daily with supper.     Marland Kitchen atenolol (TENORMIN) 25 MG tablet Take 0.5 tablets (12.5 mg total) by mouth daily. (Patient taking differently: Take 12.5 mg by mouth every morning. ) 45 tablet 3  . chlorhexidine (PERIDEX) 0.12 % solution Rinse with 15 mls twice daily for 30 seconds. Use after breakfast and at bedtime. Spit out excess. Do not swallow. 480 mL prn  . Insulin Glargine (BASAGLAR KWIKPEN) 100 UNIT/ML SOPN INJECT 30 UNITS INTO THE SKIN AT BEDTIME. 30 mL 0  . metFORMIN (GLUCOPHAGE-XR) 500 MG 24 hr tablet Take 1 tablet (500 mg total) by mouth daily with supper. (Patient taking differently: Take 1,000 mg by mouth daily with supper. ) 60 tablet 3  . oxyCODONE-acetaminophen (PERCOCET) 5-325 MG tablet Take one or two tablets by mouth every 6 hours as needed for pain. 40 tablet 0  . simvastatin (ZOCOR) 20 MG tablet Take 1 tablet (20 mg total) by mouth at bedtime. 90 tablet 3   No current facility-administered medications for this visit.    Facility-Administered Medications Ordered in Other Visits  Medication Dose Route  Frequency Provider Last Rate Last Dose  . heparin lock flush 100 unit/mL  500 Units Intravenous Once Brunetta Genera, MD      . sodium chloride 0.9 % injection 10 mL  10 mL Intravenous PRN Brunetta Genera, MD          Physical Exam:   BP 140/80   Pulse 80   Resp 20   Ht 5\' 8"  (1.727 m)   Wt 195 lb (88.5 kg)   SpO2 95%   BMI 29.65 kg/m   General:  Well-appearing  Chest:   Clear to auscultation  CV:   Regular rate and rhythm with prominent systolic murmur  Incisions:  n/a  Abdomen:  Soft and nontender  Extremities:  Warm and well-perfused  Diagnostic Tests:  Right/Left Heart Cath and Coronary Angiography  Conclusion   1. Mild nonobstructive CAD 2. Normal right heart hemodynamics with no evidence of pulmonary HTN 3. Known severe MR by noninvasive assessment  Indications   Severe mitral regurgitation [I34.0 (ICD-10-CM)]  Procedural Details/Technique   Technical Details INDICATION: Severe mitral regurgitation (preoperative cath study)  PROCEDURAL DETAILS: The right wrist was prepped, draped, and anesthetized with 1% lidocaine. Using the modified Seldinger technique, a 5/6 French Slender sheath was introduced into the right radial artery. 3 mg of verapamil was administered through the sheath, weight-based unfractionated heparin was administered intravenously. Standard Judkins catheters were used for selective coronary angiography. The aortic valve was  crossed with the JR4 catheter and LV pressure is recorded. Catheter exchanges were performed over an exchange length guidewire. There were no immediate procedural complications. A TR band was used for radial hemostasis at the completion of the procedure. The patient was transferred to the post catheterization recovery area for further monitoring.    Estimated blood loss <50 mL.  During this procedure the patient was administered the following to achieve and maintain moderate conscious sedation: Versed 2 mg, while the  patient's heart rate, blood pressure, and oxygen saturation were continuously monitored. The period of conscious sedation was 30 minutes, of which I was present face-to-face 100% of this time.    Coronary Findings   Dominance: Right  Left Main  Vessel is angiographically normal.  Left Anterior Descending  There is mild the vessel.  Prox LAD lesion, 40% stenosed.  Dist LAD lesion, 50% stenosed. apical lesion, small caliber vessel  First Diagonal Branch  Ost 1st Diag to 1st Diag lesion, 50% stenosed.  Left Circumflex  First Obtuse Marginal Branch  1st Mrg lesion, 40% stenosed.  Right Coronary Artery  There is mild the vessel. The vessel is mildly ectatic. The RCA is dominant with diffuse irregularity but no significant stenosis. There is mild ectasia present in the distal RCA.  Right Heart   Right Heart Pressures LV EDP is normal.    Coronary Diagrams   Diagnostic Diagram       Implants     No implant documentation for this case.  PACS Images   Show images for Cardiac catheterization   Link to Procedure Log   Procedure Log    Hemo Data    Most Recent Value  Fick Cardiac Output 3.98 L/min  Fick Cardiac Output Index 1.93 (L/min)/BSA  RA A Wave 5 mmHg  RA V Wave 3 mmHg  RA Mean 3 mmHg  RV Systolic Pressure 23 mmHg  RV Diastolic Pressure 0 mmHg  RV EDP 7 mmHg  PA Systolic Pressure 26 mmHg  PA Diastolic Pressure 7 mmHg  PA Mean 17 mmHg  PW A Wave 12 mmHg  PW V Wave 11 mmHg  PW Mean 11 mmHg  AO Systolic Pressure 671 mmHg  AO Diastolic Pressure 62 mmHg  AO Mean 78 mmHg  LV Systolic Pressure 245 mmHg  LV Diastolic Pressure 10 mmHg  LV EDP 15 mmHg  Arterial Occlusion Pressure Extended Systolic Pressure 809 mmHg  Arterial Occlusion Pressure Extended Diastolic Pressure 65 mmHg  Arterial Occlusion Pressure Extended Mean Pressure 84 mmHg  Left Ventricular Apex Extended Systolic Pressure 983 mmHg  Left Ventricular Apex Extended Diastolic Pressure 0 mmHg  Left  Ventricular Apex Extended EDP Pressure 15 mmHg  QP/QS 1  TPVR Index 8.81 HRUI  TSVR Index 40.41 HRUI  PVR SVR Ratio 0.08  TPVR/TSVR Ratio 0.22    CT ANGIOGRAPHY CHEST, ABDOMEN AND PELVIS  TECHNIQUE: Multidetector CT imaging through the chest, abdomen and pelvis was performed using the standard protocol during bolus administration of intravenous contrast. Multiplanar reconstructed images and MIPs were obtained and reviewed to evaluate the vascular anatomy.  CONTRAST:  75 cc Isovue 370 IV  COMPARISON:  CT abdomen and pelvis 03/29/2016  FINDINGS: CTA CHEST FINDINGS  Cardiovascular: Heart is upper limits normal in size. Scattered coronary artery calcifications in the left anterior descending and right coronary arteries. Aorta is normal caliber. Maximum aortic diameter in the ascending thoracic aorta 3.2 cm. No dissection. No filling defects in the pulmonary arteries to suggest pulmonary emboli.  Mediastinum/Nodes: No mediastinal,  hilar, or axillary adenopathy. Trachea and esophagus are unremarkable.  Lungs/Pleura: Lungs are clear. No focal airspace opacities or suspicious nodules. No effusions.  Musculoskeletal: Chest wall soft tissues are unremarkable. No acute bony abnormality.  Review of the MIP images confirms the above findings.  CTA ABDOMEN AND PELVIS FINDINGS  VASCULAR  Aorta: Normal caliber. No dissection or aneurysm. Mild atherosclerotic calcifications in the infrarenal aorta.  Celiac: Widely patent  SMA: Widely patent  Renals: Widely patent, single bilaterally.  IMA: Widely patent  Inflow: Widely patent  Veins: Grossly unremarkable.  Review of the MIP images confirms the above findings.  NON-VASCULAR  Hepatobiliary: Small scattered hypodensities in the liver, likely small cysts. Gallbladder unremarkable.  Pancreas: Mild fatty replacement. No focal abnormality or ductal dilatation.  Spleen: No focal abnormality.  Normal  size.  Adrenals/Urinary Tract: No adrenal abnormality. No focal renal abnormality. No stones or hydronephrosis. Urinary bladder is unremarkable. A portion of the anterior bladder wall is located within a left inguinal hernia.  Stomach/Bowel: Scattered colonic diverticula. No active diverticulitis. Appendix is normal. Stomach and small bowel decompressed, unremarkable.  Lymphatic: No adenopathy.  Reproductive: No visible focal abnormality. Mild prostate enlargement.  Other: No free fluid or free air. Bilateral inguinal hernias, containing fat on the right and a portion of the anterior bladder wall on the left.  Musculoskeletal: No acute bony abnormality.  Review of the MIP images confirms the above findings.  IMPRESSION: No evidence of aortic aneurysm or dissection.  No acute cardiopulmonary disease.  No acute findings in the abdomen or pelvis.  Coronary artery disease.  Bilateral inguinal hernias. The anterior bladder wall is located within the left inguinal hernia.   Electronically Signed   By: Rolm Baptise M.D.   On: 06/16/2016 09:37     Impression:  Patient has mitral valve prolapse with stage D severe symptomatic primary mitral regurgitation.  I have personally reviewed the patient's recent transthoracic and transesophageal echocardiograms, cardiac catheterization and CT angiogram. 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 Left ventricular size and systolic function remained normal. The left atrium is dilated.   Catheterization is notable for the presence of mild nonobstructive coronary artery disease with normal right heart hemodynamics and no pulmonary hypertension. CT angiography reveals no contraindications for peripheral arterial cannulation for  surgery.    Plan:  The patient and his wife were again counseled at length regarding the indications, risks and potential benefits of mitral valve repair.  The rationale for elective surgery has been explained, including a comparison between surgery and continued medical therapy with close follow-up.  The likelihood of successful and durable valve repair has been discussed with particular reference to the findings of their recent echocardiogram.  Based upon these findings and previous experience, I have quoted them a greater than 95 percent likelihood of successful valve repair. The patient understands and accepts all potential risks of surgery including but not limited to risk of death, stroke or other neurologic complication, myocardial infarction, congestive heart failure, respiratory failure, renal failure, bleeding requiring transfusion and/or reexploration, arrhythmia, infection or other wound complications, pneumonia, pleural and/or pericardial effusion, pulmonary embolus, aortic dissection or other major vascular complication, or delayed complications related to valve repair or replacement including but not limited to structural valve deterioration and failure, thrombosis, embolization, endocarditis, or paravalvular leak.  Alternative surgical approaches have been discussed including a comparison  between conventional sternotomy and minimally-invasive techniques.  The relative risks and benefits of each have been reviewed as they pertain to the patient's specific circumstances, and all of their questions have been addressed.  Specific risks potentially related to the minimally-invasive approach were discussed at length, including but not limited to risk of conversion to full or partial sternotomy, aortic dissection or other major vascular complication, unilateral acute lung injury or pulmonary edema, phrenic nerve dysfunction or paralysis, rib fracture, chronic pain, lung hernia, or lymphocele. All of  their questions have been answered.  For surgery 06/21/2016.  I spent in excess of 15 minutes during the conduct of this office consultation and >50% of this time involved direct face-to-face encounter with the patient for counseling and/or coordination of their care.    Valentina Gu. Roxy Manns, MD 06/19/2016 3:32 PM

## 2016-06-19 NOTE — Telephone Encounter (Signed)
Lillard Bailon Self [phone]  Oree stop by to let you know he is having Mitral Valve repair surgery on Wednesday 06/21/16 at [facility]. He was instructed to let you know. If any questions call him at above number.

## 2016-06-19 NOTE — Telephone Encounter (Signed)
Noted. Please let us know few days after the Sx how his sugars are doing.

## 2016-06-20 LAB — BLOOD GAS, ARTERIAL
ACID-BASE EXCESS: 1 mmol/L (ref 0.0–2.0)
BICARBONATE: 24.7 mmol/L (ref 20.0–28.0)
Drawn by: 421801
FIO2: 21
O2 Saturation: 95.3 %
PH ART: 7.44 (ref 7.350–7.450)
PO2 ART: 74.8 mmHg — AB (ref 83.0–108.0)
Patient temperature: 98.6
pCO2 arterial: 36.9 mmHg (ref 32.0–48.0)

## 2016-06-20 LAB — HEMOGLOBIN A1C
Hgb A1c MFr Bld: 6.4 % — ABNORMAL HIGH (ref 4.8–5.6)
Mean Plasma Glucose: 137 mg/dL

## 2016-06-20 MED ORDER — MAGNESIUM SULFATE 50 % IJ SOLN
40.0000 meq | INTRAMUSCULAR | Status: DC
  Filled 2016-06-20: qty 10

## 2016-06-20 MED ORDER — GLUTARALDEHYDE 0.625% SOAKING SOLUTION
TOPICAL | Status: DC | PRN
  Filled 2016-06-20: qty 50

## 2016-06-20 MED ORDER — PLASMA-LYTE 148 IV SOLN
INTRAVENOUS | Status: AC
  Administered 2016-06-21: 500 mL
  Filled 2016-06-20: qty 2.5

## 2016-06-20 MED ORDER — EPINEPHRINE PF 1 MG/ML IJ SOLN
0.0000 ug/min | INTRAMUSCULAR | Status: DC
  Filled 2016-06-20: qty 4

## 2016-06-20 MED ORDER — DOPAMINE-DEXTROSE 3.2-5 MG/ML-% IV SOLN
0.0000 ug/kg/min | INTRAVENOUS | Status: DC
  Filled 2016-06-20: qty 250

## 2016-06-20 MED ORDER — TRANEXAMIC ACID (OHS) PUMP PRIME SOLUTION
2.0000 mg/kg | INTRAVENOUS | Status: DC
  Filled 2016-06-20: qty 1.77

## 2016-06-20 MED ORDER — TRANEXAMIC ACID (OHS) BOLUS VIA INFUSION
15.0000 mg/kg | INTRAVENOUS | Status: AC
  Administered 2016-06-21: 1327.5 mg via INTRAVENOUS
  Filled 2016-06-20: qty 1328

## 2016-06-20 MED ORDER — DEXTROSE 5 % IV SOLN
1.5000 g | INTRAVENOUS | Status: AC
  Administered 2016-06-21: 1.5 g via INTRAVENOUS
  Administered 2016-06-21: .75 g via INTRAVENOUS
  Filled 2016-06-20: qty 1.5

## 2016-06-20 MED ORDER — LIDOCAINE HCL (CARDIAC) 20 MG/ML IV SOLN
260.0000 mg | Freq: Once | INTRAVENOUS | Status: DC
  Filled 2016-06-20 (×2): qty 13

## 2016-06-20 MED ORDER — NITROGLYCERIN IN D5W 200-5 MCG/ML-% IV SOLN
2.0000 ug/min | INTRAVENOUS | Status: DC
  Filled 2016-06-20: qty 250

## 2016-06-20 MED ORDER — SODIUM CHLORIDE 0.9 % IV SOLN
30.0000 ug/min | INTRAVENOUS | Status: AC
  Administered 2016-06-21: 15 ug/min via INTRAVENOUS
  Filled 2016-06-20: qty 2

## 2016-06-20 MED ORDER — MANNITOL 20% IV SOLUTION 10G/50ML
6.4000 g | Freq: Once | INTRAVENOUS | Status: DC
  Filled 2016-06-20 (×2): qty 60

## 2016-06-20 MED ORDER — SODIUM CHLORIDE 0.9 % IV SOLN
INTRAVENOUS | Status: AC
  Administered 2016-06-21: 1000 mL
  Filled 2016-06-20: qty 1000

## 2016-06-20 MED ORDER — VANCOMYCIN HCL 10 G IV SOLR
1250.0000 mg | INTRAVENOUS | Status: AC
  Administered 2016-06-21: 1250 mg via INTRAVENOUS
  Filled 2016-06-20 (×2): qty 1250

## 2016-06-20 MED ORDER — DEXMEDETOMIDINE HCL IN NACL 400 MCG/100ML IV SOLN
0.1000 ug/kg/h | INTRAVENOUS | Status: DC
  Filled 2016-06-20: qty 100

## 2016-06-20 MED ORDER — DEXTROSE 5 % IV SOLN
750.0000 mg | INTRAVENOUS | Status: DC
  Filled 2016-06-20: qty 750

## 2016-06-20 MED ORDER — POTASSIUM CHLORIDE 2 MEQ/ML IV SOLN
80.0000 meq | INTRAVENOUS | Status: DC
  Filled 2016-06-20: qty 40

## 2016-06-20 MED ORDER — TRANEXAMIC ACID 1000 MG/10ML IV SOLN
1.5000 mg/kg/h | INTRAVENOUS | Status: AC
  Administered 2016-06-21: 1.5 mg/kg/h via INTRAVENOUS
  Filled 2016-06-20: qty 25

## 2016-06-20 MED ORDER — SODIUM CHLORIDE 0.9 % IV SOLN
INTRAVENOUS | Status: DC
  Filled 2016-06-20: qty 1

## 2016-06-20 MED ORDER — SODIUM CHLORIDE 0.9 % IV SOLN
INTRAVENOUS | Status: DC
  Filled 2016-06-20: qty 30

## 2016-06-20 NOTE — H&P (Signed)
[address]       [place] [address]             [id number]          CARDIOTHORACIC SURGERY HISTORY AND PHYSICAL EXAM  Referring Provider is Minus Breeding, MD PCP is Joycelyn Man, MD      Chief Complaint  Patient presents with  . Mitral Regurgitation    Surgical eval, TEE 73/28/17     HPI:  Patient is a 71 year old male with history of mitral valve prolapse with mitral regurgitation, hypertension, type II diabetes, acute myeloid leukemia in remission, hyperlipidemia, and kidney stones who has been referred for surgical consultation to discuss management options for mitral valve prolapse with severe primary mitral regurgitation. The patient states that he was first noted to have a heart murmur on physical exam performed by his primary care physician many years ago. He was referred to Dr. Percival Spanish and has undergone transthoracic echocardiogram regularly in follow-up.   In 2015 he developed AML and underwent chemotherapy at [facility].  He recovered from this uneventfully and has remained in remission since. Last fall he underwent routine follow-up echocardiogram which revealed what appeared to be significant progression of severity of mitral regurgitation. Transesophageal echocardiogram was performed 01/11/2016 and confirmed the presence of a flail segment involving the middle portion of the posterior leaflet with ruptured chordae tendineae and severe mitral regurgitation. Left ventricular systolic function remained normal with ejection fraction estimated 55-60%. There was left atrial chamber enlargement. There was a small patent foramen ovale. He was referred for elective surgical consultation. In addition, the patient has recently had problems with kidney stones. He passed 2 of 3 stones on his own but recently had a third stone become lodged, requiring cystoscopy for extraction.    Patient is married and lives locally in [place] with his  wife. He is retired having previously worked as an Optometrist. He has remained fairly active physically during retirement although he states that his bout with AML and chemotherapy slowed down quite a bit several years ago. He still enjoys essentially normal physical activity without any significant limitations. He just recently underwent cystoscopy for extraction of an impacted kidney stone. Subsequent to this he had problems with severe constipation in the setting of prolonged oral antibiotics. All of this seems to be resolving. He states that he has began to experience mild symptoms of exertional shortness of breath. This occurs only with more strenuous physical exertion such as going up a flight of stairs or walking up a steep hill. Breathing is comfortable with ordinary activity. He has not had any PND, orthopnea, or lower extremity edema. He denies any history of chest tightness or chest pressure. He has had some dizzy spells without syncope. He denies any history of palpitations.   Patient returns to the office today for follow-up of mitral valve prolapse with stage D severe symptomatic primary mitral regurgitation.  He was originally seen in consultation on 05/29/2016. Since then he has been seen in follow-up by his urologist and cleared for surgery. He was seen in consultation by Dr. Enrique Sack and underwent dental extraction 06/08/2016. He also underwent diagnostic cardiac catheterization and CT angiography. He returns for office today with tentative plans to proceed with surgery on 06/21/2016. He reports no new problems or complaints.     Past Medical History:  Diagnosis Date  . AML (acute myeloid leukemia) in remission [facility]) oncologist-  dr Alvy Bimler-- per last note 10/ 2017  in remission 2 years   dx 09-11-2013  via bone marrow bx , FLT3 negative (NP M1 +)/  chemotherapy started 09-17-2013,  remission via marrow bx 11-06-2013,  4 cycles consolidation chemo w/ HiDAC 11-13-2013 to 03-05-2014  .  BPH with urinary obstruction   . Chronic thrombocytopenic purpura (HCC)   . Dental caries    periodontitis  . Dyspnea   . GERD (gastroesophageal reflux disease)   . Heart murmur   . History of kidney stones   . Hyperlipidemia   . Hypertension   . MVP (mitral valve prolapse)   . Pancytopenia, acquired (HCC)   . Right ureteral stone   . Severe mitral regurgitation   . Type 2 diabetes mellitus with hypoglycemia, with long-term current use of insulin [facility])    endocrinologist-  dr Mickle Mallory  . Wears dentures    upper    Past Surgical History:  Procedure Laterality Date  . CARDIAC CATHETERIZATION    . CARDIOVASCULAR STRESS TEST  07/23/2013   Low risk nuclear study w/ mild inferior ischemia/  normal LV function and wall motion, ef 69%  . CYSTOSCOPY WITH RETROGRADE PYELOGRAM, URETEROSCOPY AND STENT PLACEMENT Right 04/20/2016   Procedure: CYSTOSCOPY WITH RETROGRADE PYELOGRAM, URETEROSCOPY , STONE BASKETRY AND STENT PLACEMENT;  Surgeon: Marcine Matar, MD;  Location: [facility];  Service: Urology;  Laterality: Right;  . EXTRACORPOREAL SHOCK WAVE LITHOTRIPSY  yrs ago  . HOLMIUM LASER APPLICATION Right 04/20/2016   Procedure: HOLMIUM LASER APPLICATION;  Surgeon: Marcine Matar, MD;  Location: [facility];  Service: Urology;  Laterality: Right;  . INGUINAL HERNIA REPAIR Left 1990's  . MULTIPLE EXTRACTIONS WITH ALVEOLOPLASTY N/A 06/08/2016   Procedure: MULTIPLE EXTRACTION WITH ALVEOLOPLASTY AND PRE-PROSTHETIC SURGERY AS NEEDED;  Surgeon: Charlynne Pander, DDS;  Location: [facility] OR;  Service: Oral Surgery;  Laterality: N/A;  . RIGHT/LEFT HEART CATH AND CORONARY ANGIOGRAPHY N/A 06/07/2016   Procedure: Right/Left Heart Cath and Coronary Angiography;  Surgeon: Tonny Bollman, MD;  Location: [facility] INVASIVE CV LAB;  Service: Cardiovascular;  Laterality: N/A;  . ROTATOR CUFF REPAIR Right x2  last one 2011  . TEE WITHOUT CARDIOVERSION N/A 01/11/2016   Procedure: TRANSESOPHAGEAL  ECHOCARDIOGRAM (TEE);  Surgeon: Chrystie Nose, MD;  Location: [facility] ENDOSCOPY;  Service: Cardiovascular;  Laterality: N/A; Severe MR associated w/ flail P2 segment of MV;  no LAA thromus;  small PFO by saline microbubble contrast after valsalva; LVEF 55-60%  . TENNIS ELBOW RELEASE/NIRSCHEL PROCEDURE Right 1990's  . TRANSTHORACIC ECHOCARDIOGRAM  12/22/2015   grade 1 diastolic dysfunction,  ef 55-60%/  moderate MVP involving posterior leaflet w/ partial flail and severe MR via CW doppler (peak gradient 59mmHg)/  trivial TR    Family History  Problem Relation Age of Onset  . Asthma Sister   . Cancer Daughter     carcinoid tumor    Social History Social History  Substance Use Topics  . Smoking status: Former Smoker    Years: 20.00    Types: Cigarettes    Quit date: 04/18/1974  . Smokeless tobacco: Never Used  . Alcohol use No    Prior to Admission medications   Medication Sig Start Date End Date Taking? Authorizing Provider  aspirin 81 MG tablet Take 81 mg by mouth daily with supper.    Yes [provider]  atenolol (TENORMIN) 25 MG tablet Take 0.5 tablets (12.5 mg total) by mouth daily. Patient taking differently: Take 12.5 mg by mouth every morning.  10/26/15  Yes Kelle Darting  A, MD  Insulin Glargine (BASAGLAR KWIKPEN) 100 UNIT/ML SOPN INJECT 30 UNITS INTO THE SKIN AT BEDTIME. 05/30/16  Yes Philemon Kingdom, MD  metFORMIN (GLUCOPHAGE-XR) 500 MG 24 hr tablet Take 1 tablet (500 mg total) by mouth daily with supper. Patient taking differently: Take 1,000 mg by mouth daily with supper.  05/09/16  Yes Philemon Kingdom, MD  simvastatin (ZOCOR) 20 MG tablet Take 1 tablet (20 mg total) by mouth at bedtime. 10/26/15  Yes Dorena Cookey, MD  chlorhexidine (PERIDEX) 0.12 % solution Rinse with 15 mls twice daily for 30 seconds. Use after breakfast and at bedtime. Spit out excess. Do not swallow. 06/19/16 12/19/16  Lenn Cal, DDS  oxyCODONE-acetaminophen (PERCOCET) 5-325 MG tablet Take  one or two tablets by mouth every 6 hours as needed for pain. 06/08/16   Lenn Cal, DDS    Allergies  Allergen Reactions  . Prochlorperazine Other (See Comments)    ARRHYTHMIAS  . Morphine And Related     Shuts bladder down.      Review of Systems:              General:                      normal appetite, normal energy, no weight gain, no weight loss, no fever             Cardiac:                       no chest pain with exertion, no chest pain at rest, + SOB with more strenuous exertion, no resting SOB, no PND, no orthopnea, no palpitations, no arrhythmia, no atrial fibrillation, no LE edema, + dizzy spells, no syncope             Respiratory:                 + mild exertional shortness of breath, no home oxygen, no productive cough, no dry cough, no bronchitis, no wheezing, no hemoptysis, + cold weather induced asthma, no pain with inspiration or cough, no sleep apnea, no CPAP at night             GI:                               no difficulty swallowing, + reflux, NO frequent heartburn, + hiatal hernia, no abdominal pain, + constipation, no diarrhea, no hematochezia, no hematemesis, no melena             GU:                              no dysuria,  + frequency, no urinary tract infection, + hematuria, no enlarged prostate, + kidney stones, no kidney disease             Vascular:                     no pain suggestive of claudication, no pain in feet, no leg cramps, no varicose veins, no DVT, no non-healing foot ulcer             Neuro:                         no stroke, no TIA's, no seizures, no headaches, no  temporary blindness one eye,  no slurred speech, no peripheral neuropathy, no chronic pain, no instability of gait, no memory/cognitive dysfunction             Musculoskeletal:         no arthritis, no joint swelling, no myalgias, no difficulty walking, normal mobility              Skin:                            no rash, no itching, no skin infections, no pressure  sores or ulcerations             Psych:                         no anxiety, no depression, no nervousness, no unusual recent stress             Eyes:                           no blurry vision, no floaters, no recent vision changes, + wears glasses only for reading             ENT:                            no hearing loss, no loose or painful teeth, partial upper dentures, last saw dentist many years ago             Hematologic:               no easy bruising, no abnormal bleeding, no clotting disorder, no frequent epistaxis             Endocrine:                   + diabetes, does check CBG's at home                           Physical Exam:              BP 120/74   Pulse 68   Resp 20   Ht '5\' 8"'$  (1.727 m)   Wt 200 lb (90.7 kg)   SpO2 97% Comment: RA  BMI 30.41 kg/m              General:                        well-appearing             HEENT:                       Unremarkable              Neck:                           no JVD, no bruits, no adenopathy              Chest:                          clear to auscultation, symmetrical breath sounds, no wheezes, no rhonchi  CV:                              RRR, grade III/VI holosystolic murmur              Abdomen:                    soft, non-tender, no masses              Extremities:                 warm, well-perfused, pulses palpable, no LE edema             Rectal/GU                   Deferred             Neuro:                         Grossly non-focal and symmetrical throughout             Skin:                            Clean and dry, no rashes, no breakdown   Diagnostic Tests:  Echocardiography  Patient: Kagan, Mutchler MR #: [id number] Study Date: 12/22/2015 Gender: M Age: 41 Height: 169.5 cm Weight: 95.5 kg BSA: 2.16 m^2 Pt. Status: Room:  ATTENDING Minus Breeding, MD ORDERING Minus Breeding, MD [place], MD SONOGRAPHER  Cindy Hazy, RDCS PERFORMING [facility], Outpatient  cc:  ------------------------------------------------------------------- LV EF: 55% - 60%  ------------------------------------------------------------------- Indications: I34.0 Mitral Regurgitation.  ------------------------------------------------------------------- History: PMH: Acquired from the patient and from the patient&'s chart. PMH: History of Acute Myeloid Leukemia. Mitral regurgitation. Risk factors: Hypertension. Diabetes mellitus. Dyslipidemia.  ------------------------------------------------------------------- Study Conclusions  - Left ventricle: The cavity size was normal. Systolic function was normal. The estimated ejection fraction was in the range of 55% to 60%. Doppler parameters are consistent with abnormal left ventricular relaxation (grade 1 diastolic dysfunction). - Mitral valve: Moderate prolapse, involving the posterior leaflet. Probable flail motion involving the posterior leaflet. There was mild regurgitation. There appears to be a partial flail of the posterior MY leaflet. Visually MR appears mild at worst but density of signal on CW Doppler suggests much more severe MR. Would suggest TEE for further evaluation. Reviewed with Dr. Aundra Dubin.  ------------------------------------------------------------------- Study data: Study status: Routine. Procedure: The patient reported no pain pre or post test. Transthoracic echocardiography for left ventricular function evaluation, for right ventricular function evaluation, and for assessment of valvular function. Image quality was adequate. Study completion: There were no complications. Echocardiography. M-mode, complete 2D, spectral Doppler, and color Doppler. Birthdate: Patient birthdate: 1945/05/27. Age: Patient is 71 yr old. Sex: Gender: male. BMI: 33.2 kg/m^2. Blood pressure:  120/70 Patient status: Outpatient. Study date: Study date: 12/22/2015. Study time: 02:03 PM. Location: [facility]  -------------------------------------------------------------------  ------------------------------------------------------------------- Left ventricle: The cavity size was normal. Systolic function was normal. The estimated ejection fraction was in the range of 55% to 60%. Doppler parameters are consistent with abnormal left ventricular relaxation (grade 1 diastolic dysfunction).  ------------------------------------------------------------------- Aortic valve: Structurally normal valve. Cusp separation was normal. Doppler: Transvalvular velocity was within the normal range. There was no stenosis. There was no regurgitation.  ------------------------------------------------------------------- Aorta: Aortic root: The aortic root was normal in size. Ascending aorta:  The ascending aorta was normal in size.  ------------------------------------------------------------------- Mitral valve: Moderate prolapse, involving the posterior leaflet. Probable flail motion involving the posterior leaflet. There appears to be a partial flail of the posterior MY leaflet. Visually MR appears mild at worst but density of signal on CW Doppler suggests much more severe MR. Would suggest TEE for further evaluation. Reviewed with Dr. Aundra Dubin. Doppler: There was mild regurgitation. Peak gradient (D): 3 mm Hg.  ------------------------------------------------------------------- Left atrium: The atrium was normal in size.  ------------------------------------------------------------------- Right ventricle: The cavity size was normal. Wall thickness was normal. Systolic function was normal.  ------------------------------------------------------------------- Pulmonic valve: Structurally normal valve. Cusp separation was normal. Doppler:  Transvalvular velocity was within the normal range. There was no regurgitation.  ------------------------------------------------------------------- Tricuspid valve: Structurally normal valve. Leaflet separation was normal. Doppler: Transvalvular velocity was within the normal range. There was trivial regurgitation.  ------------------------------------------------------------------- Right atrium: The atrium was normal in size.  ------------------------------------------------------------------- Pericardium: The pericardium was normal in appearance.  ------------------------------------------------------------------- Systemic veins: Inferior vena cava: The vessel was normal in size. The respirophasic diameter changes were in the normal range (= 50%), consistent with normal central venous pressure.  ------------------------------------------------------------------- Measurements  Left ventricle Value Reference LV ID, ED, PLAX chordal 51.4 mm 43 - 52 LV ID, ES, PLAX chordal 26.1 mm 23 - 38 LV fx shortening, PLAX chordal 49 % >=29 LV PW thickness, ED 11.5 mm --------- IVS/LV PW ratio, ED 0.99 <=1.3 Stroke volume, 2D 74 ml --------- Stroke volume/bsa, 2D 34 ml/m^2 --------- LV e&', lateral 9.54 cm/s --------- LV E/e&', lateral 8.86 --------- LV e&', medial 8.66 cm/s --------- LV E/e&', medial 9.76 --------- LV e&', average 9.1 cm/s --------- LV E/e&', average 9.29 ---------  Ventricular septum Value Reference IVS thickness, ED 11.4 mm  ---------  LVOT Value Reference LVOT ID, S 23 mm --------- LVOT area 4.15 cm^2 --------- LVOT ID 23 mm --------- LVOT peak velocity, S 97.8 cm/s --------- LVOT mean velocity, S 70 cm/s --------- LVOT VTI, S 17.9 cm --------- LVOT peak gradient, S 4 mm Hg --------- Stroke volume (SV), LVOT DP 74.4 ml --------- Stroke index (SV/bsa), LVOT DP 34.5 ml/m^2 ---------  Aorta Value Reference Aortic root ID, ED 38 mm --------- Ascending aorta ID, A-P, S 34 mm ---------  Left atrium Value Reference LA ID, A-P, ES 39 mm --------- LA ID/bsa, A-P 1.81 cm/m^2 <=2.2 LA volume, S 45 ml --------- LA volume/bsa, S 20.9 ml/m^2 --------- LA volume, ES, 1-p A4C 53 ml --------- LA volume/bsa, ES, 1-p A4C 24.6 ml/m^2 --------- LA volume, ES, 1-p A2C 38 ml --------- LA volume/bsa, ES, 1-p A2C 17.6 ml/m^2 ---------  Mitral valve Value Reference Mitral E-wave peak velocity 84.5 cm/s --------- Mitral A-wave peak velocity 90.5 cm/s --------- Mitral deceleration time 222 ms 150 - 230 Mitral peak gradient, D 3 mm Hg --------- Mitral E/A ratio, peak 0.9 ---------  Right ventricle Value Reference RV s&', lateral, S 17.5 cm/s  ---------  Legend: (L) and (H) mark values outside specified reference range.  ------------------------------------------------------------------- Prepared and Electronically Authenticated by  Pierre Bali, MD 2017-11-08T17:53:18   Transesophageal Echocardiography  Patient: Kiam, Bransfield MR #: [id number] Study Date: 01/11/2016 Gender: M Age: 59 Height: 174 cm Weight: 95.3 kg BSA: 2.18 m^2 Pt. Status: Room:  ADMITTING Fransico Him, MD SONOGRAPHER Florentina Jenny, RDCS ATTENDING Lyman Bishop MD Thiells MD PERFORMING Lyman Bishop MD REFERRING Lyman Bishop MD  cc:  ------------------------------------------------------------------- LV EF: 55% - 60%  ------------------------------------------------------------------- Indications: 424.0 Mitral valve disease.  -------------------------------------------------------------------  History: Risk factors: Hypertension. Diabetes mellitus. Dyslipidemia.  ------------------------------------------------------------------- Study Conclusions  - Left ventricle: The cavity size was normal. There was mild concentric hypertrophy. Systolic function was normal. The estimated ejection fraction was in the range of 55% to 60%. Wall motion was normal; there were no regional wall motion abnormalities. - Aortic valve: No evidence of vegetation. - Mitral valve: Mid to late systolic prolapse of the P2 segement of the mitral valve with a flail cord that prolapses into the left atrium. There is severe regurgitation and reversal of flow was noted in the LUPV. Regurgitant VTI: 159 cm. Effective regurgitant orifice (PISA): 0.5 cm^2. Regurgitant volume (PISA): 80 ml. - Left atrium: The atrium was dilated. No evidence of thrombus in the atrial cavity or appendage. - Pulmonary veins: Systolic flow reversal in the LUPV. -  Right atrium: No evidence of thrombus in the atrial cavity or appendage. - Atrial septum: There was a small patent foramen ovale with right to left flow after valsalva. The interatrial septum is hypermobile, but not quite aneurysmal.  Impressions:  - Severe MR associated with a flail P2 segment of the mitral valve. Small PFO noted with valsalva.  ------------------------------------------------------------------- Study data: Study status: Routine. Consent: The risks, benefits, and alternatives to the procedure were explained to the patient and informed consent was obtained. Procedure: Initial setup. The patient was brought to the laboratory. Surface ECG leads were monitored. Sedation. Conscious sedation was administered by cardiology staff. Transesophageal echocardiography. Topical anesthesia was obtained using viscous lidocaine. A transesophageal probe was inserted by the attending cardiologist. Image quality was adequate. Study completion: The patient tolerated the procedure well. There were no complications. Administered medications: Midazolam, '4mg'$ , IV. Fentanyl, 80mg, IV. Diagnostic transesophageal echocardiography. 2D and color Doppler. Birthdate: Patient birthdate: 006-23-1947 Age: Patient is 71yr old. Sex: Gender: male. BMI: 31.5 kg/m^2. Blood pressure: 130/73 Patient status: Outpatient. Study date: Study date: 01/11/2016. Study time: 09:44 AM. Location: Endoscopy.  -------------------------------------------------------------------  ------------------------------------------------------------------- Left ventricle: The cavity size was normal. There was mild concentric hypertrophy. Systolic function was normal. The estimated ejection fraction was in the range of 55% to 60%. Wall motion was normal; there were no regional wall motion  abnormalities.  ------------------------------------------------------------------- Aortic valve: Structurally normal valve. Trileaflet. Cusp separation was normal. No evidence of vegetation. Doppler: There was no regurgitation.  ------------------------------------------------------------------- Aorta: The aorta was normal, not dilated, and non-diseased.  ------------------------------------------------------------------- Mitral valve: Mid to late systolic prolapse of the P2 segement of the mitral valve with a flail cord that prolapses into the left atrium. There is severe regurgitation and reversal of flow was noted in the LUPV. Doppler: Peak gradient (D): 2 mm Hg.  ------------------------------------------------------------------- Left atrium: The atrium was dilated. No evidence of thrombus in the atrial cavity or appendage.  ------------------------------------------------------------------- Atrial septum: There was a small patent foramen ovale with right to left flow after valsalva. The interatrial septum is hypermobile, but not quite aneurysmal.  ------------------------------------------------------------------- Pulmonary veins: Systolic flow reversal in the LUPV.  ------------------------------------------------------------------- Right ventricle: The cavity size was normal. Wall thickness was normal. Systolic function was normal.  ------------------------------------------------------------------- Pulmonic valve: Doppler: There was trivial regurgitation.  ------------------------------------------------------------------- Tricuspid valve: Doppler: There was trivial regurgitation.  ------------------------------------------------------------------- Pulmonary artery: The main pulmonary artery was normal-sized.  ------------------------------------------------------------------- Right atrium: The atrium was normal in size. No  evidence of thrombus in the atrial cavity or appendage.  ------------------------------------------------------------------- Pericardium: There was no pericardial effusion.  ------------------------------------------------------------------- Post procedure conclusions Ascending Aorta:  - The aorta was normal, not dilated, and non-diseased.  -------------------------------------------------------------------  Measurements  Aorta Value Reference Aortic arch ID, innominate-LCCA 28.16 mm 20 - 36  Mitral valve Value Reference Mitral E-wave peak velocity 74 cm/s --------- Mitral A-wave peak velocity 49.5 cm/s --------- Mitral peak gradient, D 2 mm Hg --------- Mitral E/A ratio, peak 1.5 --------- Mitral regurg VTI, PISA 159 cm --------- Mitral ERO, PISA 0.5 cm^2 --------- Mitral regurg volume, PISA 80 ml ---------  Legend: (L) and (H) mark values outside specified reference range.  ------------------------------------------------------------------- Prepared and Electronically Authenticated by  Zoila Shutter MD 2017-11-28T17:00:37   Right/Left Heart Cath and Coronary Angiography  Conclusion   1. Mild nonobstructive CAD 2. Normal right heart hemodynamics with no evidence of pulmonary HTN 3. Known severe MR by noninvasive assessment  Indications   Severe mitral regurgitation [I34.0 (ICD-10-CM)]  Procedural Details/Technique   Technical Details INDICATION: Severe mitral regurgitation (preoperative cath study)  PROCEDURAL DETAILS: The right wrist was prepped, draped, and anesthetized with 1% lidocaine. Using the modified Seldinger technique, a 5/6 French Slender sheath was introduced into the right radial artery. 3 mg of verapamil was administered through the  sheath, weight-based unfractionated heparin was administered intravenously. Standard Judkins catheters were used for selective coronary angiography. The aortic valve was crossed with the JR4 catheter and LV pressure is recorded. Catheter exchanges were performed over an exchange length guidewire. There were no immediate procedural complications. A TR band was used for radial hemostasis at the completion of the procedure. The patient was transferred to the post catheterization recovery area for further monitoring.    Estimated blood loss <50 mL.  During this procedure the patient was administered the following to achieve and maintain moderate conscious sedation: Versed 2 mg, while the patient's heart rate, blood pressure, and oxygen saturation were continuously monitored. The period of conscious sedation was 30 minutes, of which I was present face-to-face 100% of this time.    Coronary Findings   Dominance: Right  Left Main  Vessel is angiographically normal.  Left Anterior Descending  There is mild the vessel.  Prox LAD lesion, 40% stenosed.  Dist LAD lesion, 50% stenosed. apical lesion, small caliber vessel  First Diagonal Branch  Ost 1st Diag to 1st Diag lesion, 50% stenosed.  Left Circumflex  First Obtuse Marginal Branch  1st Mrg lesion, 40% stenosed.  Right Coronary Artery  There is mild the vessel. The vessel is mildly ectatic. The RCA is dominant with diffuse irregularity but no significant stenosis. There is mild ectasia present in the distal RCA.  Right Heart   Right Heart Pressures LV EDP is normal.    Coronary Diagrams   Diagnostic Diagram       Implants        No implant documentation for this case.  PACS Images   Show images for Cardiac catheterization   Link to Procedure Log   Procedure Log    Hemo Data    Most Recent Value  Fick Cardiac Output 3.98 L/min  Fick Cardiac Output Index 1.93 (L/min)/BSA  RA A Wave 5 mmHg  RA V Wave 3 mmHg  RA Mean  3 mmHg  RV Systolic Pressure 23 mmHg  RV Diastolic Pressure 0 mmHg  RV EDP 7 mmHg  PA Systolic Pressure 26 mmHg  PA Diastolic Pressure 7 mmHg  PA Mean 17 mmHg  PW A Wave 12 mmHg  PW V Wave 11 mmHg  PW Mean 11 mmHg  AO Systolic Pressure 101 mmHg  AO Diastolic Pressure 62 mmHg  AO Mean 78 mmHg  LV Systolic Pressure  341 mmHg  LV Diastolic Pressure 10 mmHg  LV EDP 15 mmHg  Arterial Occlusion Pressure Extended Systolic Pressure 937 mmHg  Arterial Occlusion Pressure Extended Diastolic Pressure 65 mmHg  Arterial Occlusion Pressure Extended Mean Pressure 84 mmHg  Left Ventricular Apex Extended Systolic Pressure 902 mmHg  Left Ventricular Apex Extended Diastolic Pressure 0 mmHg  Left Ventricular Apex Extended EDP Pressure 15 mmHg  QP/QS 1  TPVR Index 8.81 HRUI  TSVR Index 40.41 HRUI  PVR SVR Ratio 0.08  TPVR/TSVR Ratio 0.22    CT ANGIOGRAPHY CHEST, ABDOMEN AND PELVIS  TECHNIQUE: Multidetector CT imaging through the chest, abdomen and pelvis was performed using the standard protocol during bolus administration of intravenous contrast. Multiplanar reconstructed images and MIPs were obtained and reviewed to evaluate the vascular anatomy.  CONTRAST: 75 cc Isovue 370 IV  COMPARISON: CT abdomen and pelvis 03/29/2016  FINDINGS: CTA CHEST FINDINGS  Cardiovascular: Heart is upper limits normal in size. Scattered coronary artery calcifications in the left anterior descending and right coronary arteries. Aorta is normal caliber. Maximum aortic diameter in the ascending thoracic aorta 3.2 cm. No dissection. No filling defects in the pulmonary arteries to suggest pulmonary emboli.  Mediastinum/Nodes: No mediastinal, hilar, or axillary adenopathy. Trachea and esophagus are unremarkable.  Lungs/Pleura: Lungs are clear. No focal airspace opacities or suspicious nodules. No effusions.  Musculoskeletal: Chest wall soft tissues are unremarkable. No acute bony  abnormality.  Review of the MIP images confirms the above findings.  CTA ABDOMEN AND PELVIS FINDINGS  VASCULAR  Aorta: Normal caliber. No dissection or aneurysm. Mild atherosclerotic calcifications in the infrarenal aorta.  Celiac: Widely patent  SMA: Widely patent  Renals: Widely patent, single bilaterally.  IMA: Widely patent  Inflow: Widely patent  Veins: Grossly unremarkable.  Review of the MIP images confirms the above findings.  NON-VASCULAR  Hepatobiliary: Small scattered hypodensities in the liver, likely small cysts. Gallbladder unremarkable.  Pancreas: Mild fatty replacement. No focal abnormality or ductal dilatation.  Spleen: No focal abnormality. Normal size.  Adrenals/Urinary Tract: No adrenal abnormality. No focal renal abnormality. No stones or hydronephrosis. Urinary bladder is unremarkable. A portion of the anterior bladder wall is located within a left inguinal hernia.  Stomach/Bowel: Scattered colonic diverticula. No active diverticulitis. Appendix is normal. Stomach and small bowel decompressed, unremarkable.  Lymphatic: No adenopathy.  Reproductive: No visible focal abnormality. Mild prostate enlargement.  Other: No free fluid or free air. Bilateral inguinal hernias, containing fat on the right and a portion of the anterior bladder wall on the left.  Musculoskeletal: No acute bony abnormality.  Review of the MIP images confirms the above findings.  IMPRESSION: No evidence of aortic aneurysm or dissection.  No acute cardiopulmonary disease.  No acute findings in the abdomen or pelvis.  Coronary artery disease.  Bilateral inguinal hernias. The anterior bladder wall is located within the left inguinal hernia.   Electronically Signed By: Rolm Baptise M.D. On: 06/16/2016 09:37     Impression:  Patient has mitral valve prolapse with stage D severe symptomatic primary mitral  regurgitation. I have personally reviewed the patient's recent transthoracic and transesophageal echocardiograms, cardiac catheterization and CT angiogram. 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 Left ventricular size and systolic function remained normal. The left atrium is dilated.   Catheterization is notable for the presence of mild nonobstructive coronary artery disease with normal right  heart hemodynamics and no pulmonary hypertension. CT angiography reveals no contraindications for peripheral arterial cannulation for surgery.    Plan:  The patient and his wife wereagain counseled at length regarding the indications, risks and potential benefits of mitral valve repair. The rationale for elective surgery has been explained, including a comparison between surgery and continued medical therapy with close follow-up. The likelihood of successful and durable valve repair has been discussed with particular reference to the findings of their recent echocardiogram. Based upon these findings and previous experience, I have quoted them a greater than 95percent likelihood of successful valve repair. The patient understands and accepts all potential risks of surgery including but not limited to risk of death, stroke or other neurologic complication, myocardial infarction, congestive heart failure, respiratory failure, renal failure, bleeding requiring transfusion and/or reexploration, arrhythmia, infection or other wound complications, pneumonia, pleural and/or pericardial effusion, pulmonary embolus, aortic dissection or other major vascular complication, or delayed complications related to valve repair or replacement including but not limited to structural valve deterioration and failure, thrombosis, embolization, endocarditis,  or paravalvular leak. Alternative surgical approaches have been discussed including a comparison between conventional sternotomy and minimally-invasive techniques. The relative risks and benefits of each have been reviewed as they pertain to the patient's specific circumstances, and all of their questions have been addressed. Specific risks potentially related to the minimally-invasive approach were discussed at length, including but not limited to risk of conversion to full or partial sternotomy, aortic dissection or other major vascular complication, unilateral acute lung injury or pulmonary edema, phrenic nerve dysfunction or paralysis, rib fracture, chronic pain, lung hernia, or lymphocele. All of their questions have been answered.  For surgery 06/21/2016.  I spent in excess of 15 minutes during the conduct of this office consultation and >50% of this time involved direct face-to-face encounter with the patient for counseling and/or coordination of their care.    Valentina Gu. Roxy Manns, MD 06/19/2016 3:32 PM

## 2016-06-20 NOTE — Telephone Encounter (Signed)
Called and notified patient to call us in a few days after the surgery to let us know. Patient understood,.

## 2016-06-21 ENCOUNTER — Inpatient Hospital Stay (HOSPITAL_COMMUNITY)
Admission: RE | Admit: 2016-06-21 | Discharge: 2016-06-26 | DRG: 219 | Disposition: A | Discharge status: Home / Self Care | Payer: 59 | Source: Ambulatory Visit | Attending: Thoracic Surgery (Cardiothoracic Vascular Surgery) | Admitting: Thoracic Surgery (Cardiothoracic Vascular Surgery)

## 2016-06-21 ENCOUNTER — Encounter (HOSPITAL_COMMUNITY)
Admission: RE | Disposition: A | Discharge status: Home / Self Care | Payer: Self-pay | Source: Ambulatory Visit | Attending: Thoracic Surgery (Cardiothoracic Vascular Surgery)

## 2016-06-21 ENCOUNTER — Other Ambulatory Visit (HOSPITAL_COMMUNITY): Payer: Self-pay

## 2016-06-21 ENCOUNTER — Inpatient Hospital Stay (HOSPITAL_COMMUNITY): Payer: 59 | Admitting: Certified Registered Nurse Anesthetist

## 2016-06-21 ENCOUNTER — Inpatient Hospital Stay (HOSPITAL_COMMUNITY): Payer: 59

## 2016-06-21 ENCOUNTER — Encounter (HOSPITAL_COMMUNITY): Payer: Self-pay | Admitting: *Deleted

## 2016-06-21 DIAGNOSIS — Z87442 Personal history of urinary calculi: Secondary | ICD-10-CM | POA: Diagnosis not present

## 2016-06-21 DIAGNOSIS — E663 Overweight: Secondary | ICD-10-CM | POA: Diagnosis present

## 2016-06-21 DIAGNOSIS — I10 Essential (primary) hypertension: Secondary | ICD-10-CM | POA: Diagnosis present

## 2016-06-21 DIAGNOSIS — D62 Acute posthemorrhagic anemia: Secondary | ICD-10-CM | POA: Diagnosis not present

## 2016-06-21 DIAGNOSIS — D696 Thrombocytopenia, unspecified: Secondary | ICD-10-CM | POA: Diagnosis present

## 2016-06-21 DIAGNOSIS — Z87891 Personal history of nicotine dependence: Secondary | ICD-10-CM | POA: Diagnosis not present

## 2016-06-21 DIAGNOSIS — Z9889 Other specified postprocedural states: Secondary | ICD-10-CM

## 2016-06-21 DIAGNOSIS — J9811 Atelectasis: Secondary | ICD-10-CM

## 2016-06-21 DIAGNOSIS — E785 Hyperlipidemia, unspecified: Secondary | ICD-10-CM | POA: Diagnosis present

## 2016-06-21 DIAGNOSIS — E11649 Type 2 diabetes mellitus with hypoglycemia without coma: Secondary | ICD-10-CM

## 2016-06-21 DIAGNOSIS — I2510 Atherosclerotic heart disease of native coronary artery without angina pectoris: Secondary | ICD-10-CM | POA: Diagnosis present

## 2016-06-21 DIAGNOSIS — I340 Nonrheumatic mitral (valve) insufficiency: Secondary | ICD-10-CM | POA: Diagnosis present

## 2016-06-21 DIAGNOSIS — E8770 Fluid overload, unspecified: Secondary | ICD-10-CM | POA: Diagnosis not present

## 2016-06-21 DIAGNOSIS — R008 Other abnormalities of heart beat: Secondary | ICD-10-CM | POA: Diagnosis not present

## 2016-06-21 DIAGNOSIS — I511 Rupture of chordae tendineae, not elsewhere classified: Secondary | ICD-10-CM | POA: Diagnosis present

## 2016-06-21 DIAGNOSIS — J939 Pneumothorax, unspecified: Secondary | ICD-10-CM

## 2016-06-21 DIAGNOSIS — Z9221 Personal history of antineoplastic chemotherapy: Secondary | ICD-10-CM

## 2016-06-21 DIAGNOSIS — I080 Rheumatic disorders of both mitral and aortic valves: Secondary | ICD-10-CM | POA: Diagnosis present

## 2016-06-21 DIAGNOSIS — K5900 Constipation, unspecified: Secondary | ICD-10-CM | POA: Diagnosis not present

## 2016-06-21 DIAGNOSIS — E119 Type 2 diabetes mellitus without complications: Secondary | ICD-10-CM | POA: Diagnosis present

## 2016-06-21 DIAGNOSIS — D6959 Other secondary thrombocytopenia: Secondary | ICD-10-CM | POA: Diagnosis present

## 2016-06-21 DIAGNOSIS — Q211 Atrial septal defect: Secondary | ICD-10-CM

## 2016-06-21 DIAGNOSIS — D630 Anemia in neoplastic disease: Secondary | ICD-10-CM | POA: Diagnosis present

## 2016-06-21 DIAGNOSIS — Z794 Long term (current) use of insulin: Secondary | ICD-10-CM

## 2016-06-21 DIAGNOSIS — C9201 Acute myeloblastic leukemia, in remission: Secondary | ICD-10-CM | POA: Diagnosis present

## 2016-06-21 HISTORY — PX: MITRAL VALVE REPAIR: SHX2039

## 2016-06-21 HISTORY — PX: PATENT FORAMEN OVALE(PFO) CLOSURE: CATH118300

## 2016-06-21 HISTORY — DX: Other specified postprocedural states: Z98.890

## 2016-06-21 HISTORY — PX: TEE WITHOUT CARDIOVERSION: SHX5443

## 2016-06-21 LAB — POCT I-STAT, CHEM 8
BUN: 9 mg/dL (ref 6–20)
BUN: 7 mg/dL (ref 6–20)
BUN: 8 mg/dL (ref 6–20)
BUN: 8 mg/dL (ref 6–20)
BUN: 7 mg/dL (ref 6–20)
BUN: 7 mg/dL (ref 6–20)
BUN: 6 mg/dL (ref 6–20)
BUN: 8 mg/dL (ref 6–20)
CALCIUM ION: 1.26 mmol/L (ref 1.15–1.40)
CALCIUM ION: 1.18 mmol/L (ref 1.15–1.40)
CHLORIDE: 104 mmol/L (ref 101–111)
CHLORIDE: 101 mmol/L (ref 101–111)
CREATININE: 0.6 mg/dL — AB (ref 0.61–1.24)
CREATININE: 0.4 mg/dL — AB (ref 0.61–1.24)
CREATININE: 0.5 mg/dL — AB (ref 0.61–1.24)
CREATININE: 0.6 mg/dL — AB (ref 0.61–1.24)
Calcium, Ion: 0.97 mmol/L — ABNORMAL LOW (ref 1.15–1.40)
Calcium, Ion: 1.09 mmol/L — ABNORMAL LOW (ref 1.15–1.40)
Calcium, Ion: 0.96 mmol/L — ABNORMAL LOW (ref 1.15–1.40)
Calcium, Ion: 0.98 mmol/L — ABNORMAL LOW (ref 1.15–1.40)
Calcium, Ion: 0.86 mmol/L — CL (ref 1.15–1.40)
Calcium, Ion: 0.96 mmol/L — ABNORMAL LOW (ref 1.15–1.40)
Chloride: 102 mmol/L (ref 101–111)
Chloride: 103 mmol/L (ref 101–111)
Chloride: 105 mmol/L (ref 101–111)
Chloride: 104 mmol/L (ref 101–111)
Chloride: 101 mmol/L (ref 101–111)
Chloride: 106 mmol/L (ref 101–111)
Creatinine, Ser: 0.7 mg/dL (ref 0.61–1.24)
Creatinine, Ser: 0.6 mg/dL — ABNORMAL LOW (ref 0.61–1.24)
Creatinine, Ser: 0.5 mg/dL — ABNORMAL LOW (ref 0.61–1.24)
Creatinine, Ser: 0.6 mg/dL — ABNORMAL LOW (ref 0.61–1.24)
GLUCOSE: 91 mg/dL (ref 65–99)
GLUCOSE: 144 mg/dL — AB (ref 65–99)
Glucose, Bld: 102 mg/dL — ABNORMAL HIGH (ref 65–99)
Glucose, Bld: 88 mg/dL (ref 65–99)
Glucose, Bld: 101 mg/dL — ABNORMAL HIGH (ref 65–99)
Glucose, Bld: 92 mg/dL (ref 65–99)
Glucose, Bld: 98 mg/dL (ref 65–99)
Glucose, Bld: 161 mg/dL — ABNORMAL HIGH (ref 65–99)
HCT: 29 % — ABNORMAL LOW (ref 39.0–52.0)
HCT: 28 % — ABNORMAL LOW (ref 39.0–52.0)
HEMATOCRIT: 26 % — AB (ref 39.0–52.0)
HEMATOCRIT: 25 % — AB (ref 39.0–52.0)
HEMATOCRIT: 20 % — AB (ref 39.0–52.0)
HEMATOCRIT: 23 % — AB (ref 39.0–52.0)
HEMATOCRIT: 20 % — AB (ref 39.0–52.0)
HEMATOCRIT: 31 % — AB (ref 39.0–52.0)
HEMOGLOBIN: 8.5 g/dL — AB (ref 13.0–17.0)
HEMOGLOBIN: 6.8 g/dL — AB (ref 13.0–17.0)
HEMOGLOBIN: 7.8 g/dL — AB (ref 13.0–17.0)
Hemoglobin: 9.9 g/dL — ABNORMAL LOW (ref 13.0–17.0)
Hemoglobin: 8.8 g/dL — ABNORMAL LOW (ref 13.0–17.0)
Hemoglobin: 9.5 g/dL — ABNORMAL LOW (ref 13.0–17.0)
Hemoglobin: 6.8 g/dL — CL (ref 13.0–17.0)
Hemoglobin: 10.5 g/dL — ABNORMAL LOW (ref 13.0–17.0)
POTASSIUM: 3.9 mmol/L (ref 3.5–5.1)
POTASSIUM: 4.4 mmol/L (ref 3.5–5.1)
Potassium: 3.9 mmol/L (ref 3.5–5.1)
Potassium: 3.7 mmol/L (ref 3.5–5.1)
Potassium: 3.8 mmol/L (ref 3.5–5.1)
Potassium: 3.8 mmol/L (ref 3.5–5.1)
Potassium: 3.9 mmol/L (ref 3.5–5.1)
Potassium: 4.6 mmol/L (ref 3.5–5.1)
SODIUM: 141 mmol/L (ref 135–145)
SODIUM: 142 mmol/L (ref 135–145)
SODIUM: 138 mmol/L (ref 135–145)
SODIUM: 138 mmol/L (ref 135–145)
SODIUM: 138 mmol/L (ref 135–145)
SODIUM: 139 mmol/L (ref 135–145)
Sodium: 142 mmol/L (ref 135–145)
Sodium: 139 mmol/L (ref 135–145)
TCO2: 30 mmol/L (ref 0–100)
TCO2: 24 mmol/L (ref 0–100)
TCO2: 29 mmol/L (ref 0–100)
TCO2: 32 mmol/L (ref 0–100)
TCO2: 30 mmol/L (ref 0–100)
TCO2: 29 mmol/L (ref 0–100)
TCO2: 26 mmol/L (ref 0–100)
TCO2: 25 mmol/L (ref 0–100)

## 2016-06-21 LAB — POCT I-STAT 3, ART BLOOD GAS (G3+)
ACID-BASE EXCESS: 1 mmol/L (ref 0.0–2.0)
ACID-BASE EXCESS: 1 mmol/L (ref 0.0–2.0)
ACID-BASE EXCESS: 3 mmol/L — AB (ref 0.0–2.0)
ACID-BASE EXCESS: 4 mmol/L — AB (ref 0.0–2.0)
Acid-Base Excess: 4 mmol/L — ABNORMAL HIGH (ref 0.0–2.0)
BICARBONATE: 25 mmol/L (ref 20.0–28.0)
BICARBONATE: 25.4 mmol/L (ref 20.0–28.0)
Bicarbonate: 29.8 mmol/L — ABNORMAL HIGH (ref 20.0–28.0)
Bicarbonate: 27.5 mmol/L (ref 20.0–28.0)
Bicarbonate: 28.4 mmol/L — ABNORMAL HIGH (ref 20.0–28.0)
O2 SAT: 100 %
O2 SAT: 98 %
O2 Saturation: 100 %
O2 Saturation: 100 %
O2 Saturation: 95 %
PH ART: 7.479 — AB (ref 7.350–7.450)
PO2 ART: 103 mmHg (ref 83.0–108.0)
PO2 ART: 423 mmHg — AB (ref 83.0–108.0)
PO2 ART: 443 mmHg — AB (ref 83.0–108.0)
PO2 ART: 73 mmHg — AB (ref 83.0–108.0)
Patient temperature: 35.5
Patient temperature: 37.4
TCO2: 26 mmol/L (ref 0–100)
TCO2: 27 mmol/L (ref 0–100)
TCO2: 29 mmol/L (ref 0–100)
TCO2: 30 mmol/L (ref 0–100)
TCO2: 32 mmol/L (ref 0–100)
pCO2 arterial: 35 mmHg (ref 32.0–48.0)
pCO2 arterial: 35 mmHg (ref 32.0–48.0)
pCO2 arterial: 37.7 mmHg (ref 32.0–48.0)
pCO2 arterial: 40.9 mmHg (ref 32.0–48.0)
pCO2 arterial: 55.8 mmHg — ABNORMAL HIGH (ref 32.0–48.0)
pH, Arterial: 7.336 — ABNORMAL LOW (ref 7.350–7.450)
pH, Arterial: 7.404 (ref 7.350–7.450)
pH, Arterial: 7.463 — ABNORMAL HIGH (ref 7.350–7.450)
pH, Arterial: 7.503 — ABNORMAL HIGH (ref 7.350–7.450)
pO2, Arterial: 362 mmHg — ABNORMAL HIGH (ref 83.0–108.0)

## 2016-06-21 LAB — CBC
HCT: 37.5 % — ABNORMAL LOW (ref 39.0–52.0)
HEMATOCRIT: 33.3 % — AB (ref 39.0–52.0)
Hemoglobin: 12.6 g/dL — ABNORMAL LOW (ref 13.0–17.0)
Hemoglobin: 11.5 g/dL — ABNORMAL LOW (ref 13.0–17.0)
MCH: 30.4 pg (ref 26.0–34.0)
MCH: 30.8 pg (ref 26.0–34.0)
MCHC: 33.6 g/dL (ref 30.0–36.0)
MCHC: 34.5 g/dL (ref 30.0–36.0)
MCV: 90.4 fL (ref 78.0–100.0)
MCV: 89.3 fL (ref 78.0–100.0)
PLATELETS: 87 10*3/uL — AB (ref 150–400)
PLATELETS: 92 10*3/uL — AB (ref 150–400)
RBC: 4.15 MIL/uL — ABNORMAL LOW (ref 4.22–5.81)
RBC: 3.73 MIL/uL — AB (ref 4.22–5.81)
RDW: 15.2 % (ref 11.5–15.5)
RDW: 15.6 % — AB (ref 11.5–15.5)
WBC: 10.7 10*3/uL — AB (ref 4.0–10.5)
WBC: 11.9 10*3/uL — AB (ref 4.0–10.5)

## 2016-06-21 LAB — COOXEMETRY PANEL
CARBOXYHEMOGLOBIN: 1.5 % (ref 0.5–1.5)
Methemoglobin: 1.3 % (ref 0.0–1.5)
O2 Saturation: 67 %
Total hemoglobin: 11.8 g/dL — ABNORMAL LOW (ref 12.0–16.0)

## 2016-06-21 LAB — MAGNESIUM: MAGNESIUM: 2.9 mg/dL — AB (ref 1.7–2.4)

## 2016-06-21 LAB — POCT I-STAT 4, (NA,K, GLUC, HGB,HCT)
Glucose, Bld: 136 mg/dL — ABNORMAL HIGH (ref 65–99)
HCT: 32 % — ABNORMAL LOW (ref 39.0–52.0)
HEMOGLOBIN: 10.9 g/dL — AB (ref 13.0–17.0)
POTASSIUM: 3.7 mmol/L (ref 3.5–5.1)
Sodium: 140 mmol/L (ref 135–145)

## 2016-06-21 LAB — GLUCOSE, CAPILLARY
GLUCOSE-CAPILLARY: 122 mg/dL — AB (ref 65–99)
GLUCOSE-CAPILLARY: 154 mg/dL — AB (ref 65–99)
GLUCOSE-CAPILLARY: 127 mg/dL — AB (ref 65–99)
Glucose-Capillary: 100 mg/dL — ABNORMAL HIGH (ref 65–99)
Glucose-Capillary: 136 mg/dL — ABNORMAL HIGH (ref 65–99)
Glucose-Capillary: 142 mg/dL — ABNORMAL HIGH (ref 65–99)
Glucose-Capillary: 156 mg/dL — ABNORMAL HIGH (ref 65–99)

## 2016-06-21 LAB — PLATELET COUNT: Platelets: 107 10*3/uL — ABNORMAL LOW (ref 150–400)

## 2016-06-21 LAB — PROTIME-INR
INR: 1.45
Prothrombin Time: 17.8 seconds — ABNORMAL HIGH (ref 11.4–15.2)

## 2016-06-21 LAB — HEMOGLOBIN AND HEMATOCRIT, BLOOD
HCT: 26.5 % — ABNORMAL LOW (ref 39.0–52.0)
HEMOGLOBIN: 9 g/dL — AB (ref 13.0–17.0)

## 2016-06-21 LAB — CREATININE, SERUM: CREATININE: 0.73 mg/dL (ref 0.61–1.24)

## 2016-06-21 LAB — APTT: APTT: 46 s — AB (ref 24–36)

## 2016-06-21 LAB — PREPARE RBC (CROSSMATCH)

## 2016-06-21 SURGERY — REPAIR, MITRAL VALVE, MINIMALLY INVASIVE
Anesthesia: General | Site: Chest

## 2016-06-21 MED ORDER — ACETAMINOPHEN 500 MG PO TABS
1000.0000 mg | ORAL_TABLET | Freq: Four times a day (QID) | ORAL | Status: DC
  Administered 2016-06-22 – 2016-06-26 (×14): 1000 mg via ORAL
  Filled 2016-06-21 (×14): qty 2

## 2016-06-21 MED ORDER — PROTAMINE SULFATE 10 MG/ML IV SOLN
INTRAVENOUS | Status: AC
  Filled 2016-06-21: qty 25

## 2016-06-21 MED ORDER — CHLORHEXIDINE GLUCONATE 0.12 % MT SOLN
15.0000 mL | Freq: Once | OROMUCOSAL | Status: AC
  Administered 2016-06-21: 15 mL via OROMUCOSAL

## 2016-06-21 MED ORDER — MIDAZOLAM HCL 2 MG/2ML IJ SOLN: 2.0000 mg | INTRAMUSCULAR | Status: DC | PRN

## 2016-06-21 MED ORDER — SODIUM CHLORIDE 0.9 % IV SOLN: 250.0000 mL | INTRAVENOUS | Status: DC

## 2016-06-21 MED ORDER — HEPARIN SODIUM (PORCINE) 1000 UNIT/ML IJ SOLN
INTRAMUSCULAR | Status: AC
  Filled 2016-06-21: qty 1

## 2016-06-21 MED ORDER — PHENYLEPHRINE 40 MCG/ML (10ML) SYRINGE FOR IV PUSH (FOR BLOOD PRESSURE SUPPORT)
PREFILLED_SYRINGE | INTRAVENOUS | Status: AC
  Filled 2016-06-21: qty 10

## 2016-06-21 MED ORDER — SODIUM CHLORIDE 0.9 % IV SOLN
0.0000 ug/kg/h | INTRAVENOUS | Status: DC
  Filled 2016-06-21: qty 2

## 2016-06-21 MED ORDER — CHLORHEXIDINE GLUCONATE 0.12% ORAL RINSE (MEDLINE KIT)
15.0000 mL | Freq: Two times a day (BID) | OROMUCOSAL | Status: DC
  Administered 2016-06-21 – 2016-06-22 (×2): 15 mL via OROMUCOSAL

## 2016-06-21 MED ORDER — SODIUM CHLORIDE 0.45 % IV SOLN
INTRAVENOUS | Status: DC | PRN
  Administered 2016-06-21: 16:00:00 via INTRAVENOUS

## 2016-06-21 MED ORDER — MAGNESIUM SULFATE 4 GM/100ML IV SOLN
4.0000 g | Freq: Once | INTRAVENOUS | Status: AC
  Administered 2016-06-21: 4 g via INTRAVENOUS
  Filled 2016-06-21: qty 100

## 2016-06-21 MED ORDER — METOPROLOL TARTRATE 12.5 MG HALF TABLET: 12.5000 mg | ORAL_TABLET | Freq: Two times a day (BID) | ORAL | Status: DC

## 2016-06-21 MED ORDER — ACETAMINOPHEN 160 MG/5ML PO SOLN: 650.0000 mg | Freq: Once | ORAL | Status: AC

## 2016-06-21 MED ORDER — LACTATED RINGERS IV SOLN: INTRAVENOUS | Status: DC

## 2016-06-21 MED ORDER — PROPOFOL 10 MG/ML IV BOLUS
INTRAVENOUS | Status: AC
  Filled 2016-06-21: qty 20

## 2016-06-21 MED ORDER — TRAMADOL HCL 50 MG PO TABS
50.0000 mg | ORAL_TABLET | ORAL | Status: DC | PRN
  Administered 2016-06-22: 100 mg via ORAL
  Filled 2016-06-21: qty 2

## 2016-06-21 MED ORDER — PHENYLEPHRINE HCL 10 MG/ML IJ SOLN
INTRAVENOUS | Status: DC | PRN
  Administered 2016-06-21: 40 ug/min via INTRAVENOUS

## 2016-06-21 MED ORDER — LACTATED RINGERS IV SOLN
INTRAVENOUS | Status: DC | PRN
  Administered 2016-06-21: 08:00:00 via INTRAVENOUS

## 2016-06-21 MED ORDER — HEPARIN SODIUM (PORCINE) 1000 UNIT/ML IJ SOLN
INTRAMUSCULAR | Status: DC | PRN
  Administered 2016-06-21: 8 mL via INTRAVENOUS

## 2016-06-21 MED ORDER — ALBUMIN HUMAN 5 % IV SOLN
250.0000 mL | INTRAVENOUS | Status: AC | PRN
  Administered 2016-06-21 (×4): 250 mL via INTRAVENOUS
  Filled 2016-06-21 (×2): qty 250

## 2016-06-21 MED ORDER — ONDANSETRON HCL 4 MG/2ML IJ SOLN
4.0000 mg | Freq: Four times a day (QID) | INTRAMUSCULAR | Status: DC | PRN
  Administered 2016-06-23: 4 mg via INTRAVENOUS
  Filled 2016-06-21: qty 2

## 2016-06-21 MED ORDER — PROPOFOL 10 MG/ML IV BOLUS
INTRAVENOUS | Status: DC | PRN
  Administered 2016-06-21: 50 mg via INTRAVENOUS
  Administered 2016-06-21: 40 mg via INTRAVENOUS

## 2016-06-21 MED ORDER — BISACODYL 5 MG PO TBEC
10.0000 mg | DELAYED_RELEASE_TABLET | Freq: Every day | ORAL | Status: DC
  Administered 2016-06-22 – 2016-06-23 (×2): 10 mg via ORAL
  Filled 2016-06-21 (×3): qty 2

## 2016-06-21 MED ORDER — MILRINONE LACTATE IN DEXTROSE 20-5 MG/100ML-% IV SOLN
0.0000 ug/kg/min | INTRAVENOUS | Status: DC
  Administered 2016-06-21 – 2016-06-22 (×2): 0.3 ug/kg/min via INTRAVENOUS
  Filled 2016-06-21 (×2): qty 100

## 2016-06-21 MED ORDER — PHENYLEPHRINE HCL 10 MG/ML IJ SOLN
INTRAMUSCULAR | Status: DC | PRN
  Administered 2016-06-21 (×2): 40 ug via INTRAVENOUS

## 2016-06-21 MED ORDER — OXYCODONE HCL 5 MG PO TABS
5.0000 mg | ORAL_TABLET | ORAL | Status: DC | PRN
  Administered 2016-06-22: 10 mg via ORAL
  Filled 2016-06-21: qty 2

## 2016-06-21 MED ORDER — ROCURONIUM BROMIDE 10 MG/ML (PF) SYRINGE
PREFILLED_SYRINGE | INTRAVENOUS | Status: AC
  Filled 2016-06-21: qty 10

## 2016-06-21 MED ORDER — SODIUM CHLORIDE 0.9 % IV SOLN
INTRAVENOUS | Status: DC | PRN
  Administered 2016-06-21: .2 ug/kg/h via INTRAVENOUS

## 2016-06-21 MED ORDER — METOPROLOL TARTRATE 25 MG/10 ML ORAL SUSPENSION: 12.5000 mg | Freq: Two times a day (BID) | ORAL | Status: DC

## 2016-06-21 MED ORDER — ACETAMINOPHEN 650 MG RE SUPP
650.0000 mg | Freq: Once | RECTAL | Status: AC
  Administered 2016-06-21: 650 mg via RECTAL

## 2016-06-21 MED ORDER — SODIUM CHLORIDE 0.9 % IV SOLN
INTRAVENOUS | Status: DC
  Administered 2016-06-21: 16:00:00 via INTRAVENOUS

## 2016-06-21 MED ORDER — METOPROLOL TARTRATE 5 MG/5ML IV SOLN: 2.5000 mg | INTRAVENOUS | Status: DC | PRN

## 2016-06-21 MED ORDER — NITROGLYCERIN IN D5W 200-5 MCG/ML-% IV SOLN
INTRAVENOUS | Status: DC | PRN
  Administered 2016-06-21: 5 ug/min via INTRAVENOUS

## 2016-06-21 MED ORDER — SODIUM CHLORIDE 0.9 % IV SOLN: INTRAVENOUS | Status: DC

## 2016-06-21 MED ORDER — EPHEDRINE 5 MG/ML INJ
INTRAVENOUS | Status: AC
  Filled 2016-06-21: qty 10

## 2016-06-21 MED ORDER — NITROGLYCERIN IN D5W 200-5 MCG/ML-% IV SOLN: 0.0000 ug/min | INTRAVENOUS | Status: DC

## 2016-06-21 MED ORDER — MIDAZOLAM HCL 5 MG/5ML IJ SOLN
INTRAMUSCULAR | Status: DC | PRN
  Administered 2016-06-21: 2 mg via INTRAVENOUS
  Administered 2016-06-21: 3 mg via INTRAVENOUS
  Administered 2016-06-21: 2 mg via INTRAVENOUS
  Administered 2016-06-21: 3 mg via INTRAVENOUS

## 2016-06-21 MED ORDER — EPHEDRINE SULFATE 50 MG/ML IJ SOLN
INTRAMUSCULAR | Status: DC | PRN
  Administered 2016-06-21: 5 mg via INTRAVENOUS

## 2016-06-21 MED ORDER — ALBUMIN HUMAN 5 % IV SOLN
INTRAVENOUS | Status: DC | PRN
  Administered 2016-06-21: 14:00:00 via INTRAVENOUS

## 2016-06-21 MED ORDER — ROCURONIUM BROMIDE 10 MG/ML (PF) SYRINGE
PREFILLED_SYRINGE | INTRAVENOUS | Status: AC
  Filled 2016-06-21: qty 5

## 2016-06-21 MED ORDER — ASPIRIN 81 MG PO CHEW: 324.0000 mg | CHEWABLE_TABLET | Freq: Every day | ORAL | Status: DC

## 2016-06-21 MED ORDER — FENTANYL CITRATE (PF) 250 MCG/5ML IJ SOLN
INTRAMUSCULAR | Status: AC
  Filled 2016-06-21: qty 5

## 2016-06-21 MED ORDER — DEXTROSE 5 % IV SOLN
1.5000 g | Freq: Two times a day (BID) | INTRAVENOUS | Status: AC
  Administered 2016-06-21 – 2016-06-23 (×4): 1.5 g via INTRAVENOUS
  Filled 2016-06-21 (×4): qty 1.5

## 2016-06-21 MED ORDER — INSULIN REGULAR BOLUS VIA INFUSION
0.0000 [IU] | Freq: Three times a day (TID) | INTRAVENOUS | Status: DC
  Filled 2016-06-21: qty 10

## 2016-06-21 MED ORDER — PANTOPRAZOLE SODIUM 40 MG PO TBEC
40.0000 mg | DELAYED_RELEASE_TABLET | Freq: Every day | ORAL | Status: DC
  Administered 2016-06-23 – 2016-06-26 (×4): 40 mg via ORAL
  Filled 2016-06-21 (×4): qty 1

## 2016-06-21 MED ORDER — SODIUM CHLORIDE 0.9 % IV SOLN
0.0000 ug/min | INTRAVENOUS | Status: DC
  Administered 2016-06-22: 60 ug/min via INTRAVENOUS
  Administered 2016-06-22: 80 ug/min via INTRAVENOUS
  Filled 2016-06-21 (×5): qty 2

## 2016-06-21 MED ORDER — SODIUM CHLORIDE 0.9% FLUSH: 3.0000 mL | INTRAVENOUS | Status: DC | PRN

## 2016-06-21 MED ORDER — FENTANYL CITRATE (PF) 250 MCG/5ML IJ SOLN
INTRAMUSCULAR | Status: DC | PRN
  Administered 2016-06-21: 50 ug via INTRAVENOUS
  Administered 2016-06-21 (×2): 100 ug via INTRAVENOUS
  Administered 2016-06-21: 200 ug via INTRAVENOUS
  Administered 2016-06-21: 800 ug via INTRAVENOUS
  Administered 2016-06-21: 150 ug via INTRAVENOUS
  Administered 2016-06-21: 100 ug via INTRAVENOUS

## 2016-06-21 MED ORDER — POTASSIUM CHLORIDE 10 MEQ/50ML IV SOLN
10.0000 meq | INTRAVENOUS | Status: AC
  Administered 2016-06-21 (×3): 10 meq via INTRAVENOUS

## 2016-06-21 MED ORDER — FAMOTIDINE IN NACL 20-0.9 MG/50ML-% IV SOLN: 20.0000 mg | Freq: Two times a day (BID) | INTRAVENOUS | Status: AC

## 2016-06-21 MED ORDER — INSULIN REGULAR HUMAN 100 UNIT/ML IJ SOLN
INTRAMUSCULAR | Status: DC
  Administered 2016-06-21: 1.5 [IU]/h via INTRAVENOUS
  Filled 2016-06-21: qty 1

## 2016-06-21 MED ORDER — LACTATED RINGERS IV SOLN: 500.0000 mL | Freq: Once | INTRAVENOUS | Status: DC | PRN

## 2016-06-21 MED ORDER — DOCUSATE SODIUM 100 MG PO CAPS
200.0000 mg | ORAL_CAPSULE | Freq: Every day | ORAL | Status: DC
  Administered 2016-06-22 – 2016-06-25 (×4): 200 mg via ORAL
  Filled 2016-06-21 (×4): qty 2

## 2016-06-21 MED ORDER — CHLORHEXIDINE GLUCONATE 4 % EX LIQD: 30.0000 mL | CUTANEOUS | Status: DC

## 2016-06-21 MED ORDER — BUPIVACAINE 0.5 % ON-Q PUMP SINGLE CATH 400 ML
400.0000 mL | INJECTION | Status: DC
  Filled 2016-06-21: qty 400

## 2016-06-21 MED ORDER — ASPIRIN EC 325 MG PO TBEC
325.0000 mg | DELAYED_RELEASE_TABLET | Freq: Every day | ORAL | Status: DC
  Administered 2016-06-22: 325 mg via ORAL
  Filled 2016-06-21: qty 1

## 2016-06-21 MED ORDER — CHLORHEXIDINE GLUCONATE 0.12 % MT SOLN
15.0000 mL | OROMUCOSAL | Status: AC
  Administered 2016-06-21: 15 mL via OROMUCOSAL

## 2016-06-21 MED ORDER — FENTANYL CITRATE (PF) 100 MCG/2ML IJ SOLN
50.0000 ug | INTRAMUSCULAR | Status: DC | PRN
  Administered 2016-06-21: 100 ug via INTRAVENOUS
  Administered 2016-06-22 (×3): 50 ug via INTRAVENOUS
  Filled 2016-06-21 (×4): qty 2

## 2016-06-21 MED ORDER — PROTAMINE SULFATE 10 MG/ML IV SOLN
INTRAVENOUS | Status: DC | PRN
  Administered 2016-06-21: 100 mg via INTRAVENOUS
  Administered 2016-06-21: 10 mg via INTRAVENOUS
  Administered 2016-06-21: 50 mg via INTRAVENOUS
  Administered 2016-06-21: 100 mg via INTRAVENOUS

## 2016-06-21 MED ORDER — FENTANYL CITRATE (PF) 250 MCG/5ML IJ SOLN
INTRAMUSCULAR | Status: AC
  Filled 2016-06-21: qty 30

## 2016-06-21 MED ORDER — SODIUM CHLORIDE 0.9 % IV SOLN
INTRAVENOUS | Status: DC | PRN
  Administered 2016-06-21: .6 [IU]/h via INTRAVENOUS

## 2016-06-21 MED ORDER — VANCOMYCIN HCL IN DEXTROSE 1-5 GM/200ML-% IV SOLN
1000.0000 mg | Freq: Once | INTRAVENOUS | Status: AC
  Administered 2016-06-21: 1000 mg via INTRAVENOUS
  Filled 2016-06-21: qty 200

## 2016-06-21 MED ORDER — BISACODYL 10 MG RE SUPP: 10.0000 mg | Freq: Every day | RECTAL | Status: DC

## 2016-06-21 MED ORDER — SODIUM CHLORIDE 0.9 % IR SOLN
Status: DC | PRN
  Administered 2016-06-21: 1000 mL
  Administered 2016-06-21: 3000 mL

## 2016-06-21 MED ORDER — ORAL CARE MOUTH RINSE
15.0000 mL | Freq: Four times a day (QID) | OROMUCOSAL | Status: DC
  Administered 2016-06-21 – 2016-06-22 (×3): 15 mL via OROMUCOSAL

## 2016-06-21 MED ORDER — BUPIVACAINE HCL (PF) 0.5 % IJ SOLN
INTRAMUSCULAR | Status: AC
  Filled 2016-06-21: qty 30

## 2016-06-21 MED ORDER — ROCURONIUM BROMIDE 100 MG/10ML IV SOLN
INTRAVENOUS | Status: DC | PRN
  Administered 2016-06-21 (×5): 50 mg via INTRAVENOUS

## 2016-06-21 MED ORDER — BUPIVACAINE HCL (PF) 0.5 % IJ SOLN
INTRAMUSCULAR | Status: DC | PRN
  Administered 2016-06-21: 30 mL

## 2016-06-21 MED ORDER — SODIUM CHLORIDE 0.9% FLUSH
3.0000 mL | Freq: Two times a day (BID) | INTRAVENOUS | Status: DC
  Administered 2016-06-22 – 2016-06-25 (×4): 3 mL via INTRAVENOUS

## 2016-06-21 MED ORDER — METOPROLOL TARTRATE 12.5 MG HALF TABLET: 12.5000 mg | ORAL_TABLET | Freq: Once | ORAL | Status: DC

## 2016-06-21 MED ORDER — ACETAMINOPHEN 160 MG/5ML PO SOLN: 1000.0000 mg | Freq: Four times a day (QID) | ORAL | Status: DC

## 2016-06-21 MED ORDER — MIDAZOLAM HCL 10 MG/2ML IJ SOLN
INTRAMUSCULAR | Status: AC
  Filled 2016-06-21: qty 2

## 2016-06-21 MED ORDER — DOPAMINE-DEXTROSE 3.2-5 MG/ML-% IV SOLN
0.0000 ug/kg/min | INTRAVENOUS | Status: DC
  Administered 2016-06-21: 3 ug/kg/min via INTRAVENOUS

## 2016-06-21 MED ORDER — 0.9 % SODIUM CHLORIDE (POUR BTL) OPTIME
TOPICAL | Status: DC | PRN
  Administered 2016-06-21: 5000 mL

## 2016-06-21 MED ORDER — CHLORHEXIDINE GLUCONATE 0.12 % MT SOLN
OROMUCOSAL | Status: AC
  Filled 2016-06-21: qty 15

## 2016-06-21 SURGICAL SUPPLY — 110 items
ADAPTER CARDIO PERF ANTE/RETRO (ADAPTER) ×3 IMPLANT
ADH SKN CLS APL DERMABOND .7 (GAUZE/BANDAGES/DRESSINGS) ×2
ADPR PRFSN 84XANTGRD RTRGD (ADAPTER) ×2
APL SKNCLS STERI-STRIP NONHPOA (GAUZE/BANDAGES/DRESSINGS) ×2
BAG DECANTER FOR FLEXI CONT (MISCELLANEOUS) ×6 IMPLANT
BENZOIN TINCTURE PRP APPL 2/3 (GAUZE/BANDAGES/DRESSINGS) ×1 IMPLANT
BLADE SURG 11 STRL SS (BLADE) ×3 IMPLANT
CANISTER SUCT 3000ML PPV (MISCELLANEOUS) ×6 IMPLANT
CANNULA FEM VENOUS REMOTE 22FR (CANNULA) ×1 IMPLANT
CANNULA FEMORAL ART 14 SM (MISCELLANEOUS) ×3 IMPLANT
CANNULA GUNDRY RCSP 15FR (MISCELLANEOUS) ×3 IMPLANT
CANNULA OPTISITE PERFUSION 16F (CANNULA) IMPLANT
CANNULA OPTISITE PERFUSION 18F (CANNULA) ×1 IMPLANT
CANNULA SUMP PERICARDIAL (CANNULA) ×6 IMPLANT
CATH KIT ON Q 5IN SLV (PAIN MANAGEMENT) IMPLANT
CATH KIT ON-Q SILVERSOAK 5 (CATHETERS) IMPLANT
CATH KIT ON-Q SILVERSOAK 5IN (CATHETERS) ×3 IMPLANT
CONN ST 1/4X3/8  BEN (MISCELLANEOUS) ×2
CONN ST 1/4X3/8 BEN (MISCELLANEOUS) ×4 IMPLANT
CONNECTOR 1/2X3/8X1/2 3 WAY (MISCELLANEOUS) ×1
CONNECTOR 1/2X3/8X1/2 3WAY (MISCELLANEOUS) ×2 IMPLANT
CONT SPEC 4OZ CLIKSEAL STRL BL (MISCELLANEOUS) ×4 IMPLANT
COVER BACK TABLE 24X17X13 BIG (DRAPES) ×3 IMPLANT
CRADLE DONUT ADULT HEAD (MISCELLANEOUS) ×3 IMPLANT
DERMABOND ADVANCED (GAUZE/BANDAGES/DRESSINGS) ×1
DERMABOND ADVANCED .7 DNX12 (GAUZE/BANDAGES/DRESSINGS) ×4 IMPLANT
DEVICE PMI PUNCTURE CLOSURE (MISCELLANEOUS) ×3 IMPLANT
DEVICE SUT CK QUICK LOAD MINI (Prosthesis & Implant Heart) ×2 IMPLANT
DEVICE TROCAR PUNCTURE CLOSURE (ENDOMECHANICALS) ×3 IMPLANT
DRAIN CHANNEL 28F RND 3/8 FF (WOUND CARE) ×6 IMPLANT
DRAPE BILATERAL SPLIT (DRAPES) ×3 IMPLANT
DRAPE C-ARM 42X72 X-RAY (DRAPES) ×3 IMPLANT
DRAPE CV SPLIT W-CLR ANES SCRN (DRAPES) ×3 IMPLANT
DRAPE INCISE IOBAN 66X45 STRL (DRAPES) ×9 IMPLANT
DRAPE SLUSH/WARMER DISC (DRAPES) ×3 IMPLANT
DRSG COVADERM 4X8 (GAUZE/BANDAGES/DRESSINGS) ×3 IMPLANT
ELECT BLADE 6.5 EXT (BLADE) ×3 IMPLANT
ELECT REM PT RETURN 9FT ADLT (ELECTROSURGICAL) ×6
ELECTRODE REM PT RTRN 9FT ADLT (ELECTROSURGICAL) ×4 IMPLANT
FELT TEFLON 1X6 (MISCELLANEOUS) ×6 IMPLANT
FEMORAL VENOUS CANN RAP (CANNULA) IMPLANT
FLUID NSS /IRRIG 3000 ML XXX (IV SOLUTION) ×1 IMPLANT
GAUZE SPONGE 4X4 12PLY STRL LF (GAUZE/BANDAGES/DRESSINGS) ×1 IMPLANT
GAUZE SPONGE 4X4 16PLY XRAY LF (GAUZE/BANDAGES/DRESSINGS) ×1 IMPLANT
GLOVE BIO SURGEON STRL SZ 6.5 (GLOVE) ×9 IMPLANT
GLOVE BIO SURGEON STRL SZ8.5 (GLOVE) ×1 IMPLANT
GLOVE ORTHO TXT STRL SZ7.5 (GLOVE) ×11 IMPLANT
GOWN STRL REUS W/ TWL LRG LVL3 (GOWN DISPOSABLE) ×8 IMPLANT
GOWN STRL REUS W/TWL LRG LVL3 (GOWN DISPOSABLE) ×12
KIT BASIN OR (CUSTOM PROCEDURE TRAY) ×3 IMPLANT
KIT DILATOR VASC 18G NDL (KITS) ×3 IMPLANT
KIT DRAINAGE VACCUM ASSIST (KITS) ×1 IMPLANT
KIT ROOM TURNOVER OR (KITS) ×3 IMPLANT
KIT SUCTION CATH 14FR (SUCTIONS) ×3 IMPLANT
KIT SUT CK MINI COMBO 4X17 (Prosthesis & Implant Heart) ×1 IMPLANT
LEAD PACING MYOCARDI (MISCELLANEOUS) ×3 IMPLANT
LINE VENT (MISCELLANEOUS) ×1 IMPLANT
NDL AORTIC ROOT 14G 7F (CATHETERS) ×2 IMPLANT
NEEDLE AORTIC ROOT 14G 7F (CATHETERS) ×3 IMPLANT
NS IRRIG 1000ML POUR BTL (IV SOLUTION) ×16 IMPLANT
PACK OPEN HEART (CUSTOM PROCEDURE TRAY) ×3 IMPLANT
PAD ARMBOARD 7.5X6 YLW CONV (MISCELLANEOUS) ×6 IMPLANT
PAD ELECT DEFIB RADIOL ZOLL (MISCELLANEOUS) ×3 IMPLANT
RING MITRAL MEMO 3D 32MM SMD32 (Prosthesis & Implant Heart) ×1 IMPLANT
SET CANNULATION TOURNIQUET (MISCELLANEOUS) ×3 IMPLANT
SET CARDIOPLEGIA MPS 5001102 (MISCELLANEOUS) ×1 IMPLANT
SET IRRIG TUBING LAPAROSCOPIC (IRRIGATION / IRRIGATOR) ×3 IMPLANT
SOLUTION ANTI FOG 6CC (MISCELLANEOUS) ×3 IMPLANT
SPONGE GAUZE 4X4 12PLY STER LF (GAUZE/BANDAGES/DRESSINGS) ×3 IMPLANT
SPONGE LAP 18X18 X RAY DECT (DISPOSABLE) ×1 IMPLANT
SPONGE LAP 4X18 X RAY DECT (DISPOSABLE) ×1 IMPLANT
SUT BONE WAX W31G (SUTURE) ×3 IMPLANT
SUT E-PACK MINIMALLY INVASIVE (SUTURE) ×3 IMPLANT
SUT ETHIBOND (SUTURE) ×2 IMPLANT
SUT ETHIBOND 2 0 SH (SUTURE) ×1 IMPLANT
SUT ETHIBOND 2-0 RB-1 WHT (SUTURE) ×2 IMPLANT
SUT ETHIBOND X763 2 0 SH 1 (SUTURE) ×3 IMPLANT
SUT GORETEX CV 4 TH 22 36 (SUTURE) ×3 IMPLANT
SUT GORETEX CV-5THC-13 36IN (SUTURE) ×7 IMPLANT
SUT GORETEX CV4 TH-18 (SUTURE) ×6 IMPLANT
SUT MNCRL AB 3-0 PS2 18 (SUTURE) ×1 IMPLANT
SUT PROLENE 3 0 SH DA (SUTURE) ×3 IMPLANT
SUT PROLENE 3 0 SH1 36 (SUTURE) ×12 IMPLANT
SUT PROLENE 4 0 RB 1 (SUTURE) ×9
SUT PROLENE 4-0 RB1 .5 CRCL 36 (SUTURE) IMPLANT
SUT PROLENE 6 0 C 1 30 (SUTURE) ×1 IMPLANT
SUT PTFE CHORD X 20MM (SUTURE) ×1 IMPLANT
SUT SILK  1 MH (SUTURE) ×2
SUT SILK 1 MH (SUTURE) IMPLANT
SUT SILK 2 0 SH CR/8 (SUTURE) IMPLANT
SUT SILK 3 0 SH CR/8 (SUTURE) IMPLANT
SUT VIC AB 2-0 CTX 36 (SUTURE) IMPLANT
SUT VIC AB 2-0 UR6 27 (SUTURE) ×4 IMPLANT
SUT VIC AB 3-0 SH 8-18 (SUTURE) ×1 IMPLANT
SUT VICRYL 2 TP 1 (SUTURE) IMPLANT
SYR 10ML LL (SYRINGE) ×3 IMPLANT
SYSTEM SAHARA CHEST DRAIN ATS (WOUND CARE) ×3 IMPLANT
TAPE CLOTH SURG 4X10 WHT LF (GAUZE/BANDAGES/DRESSINGS) ×1 IMPLANT
TOWEL GREEN STERILE (TOWEL DISPOSABLE) ×12 IMPLANT
TOWEL GREEN STERILE FF (TOWEL DISPOSABLE) ×6 IMPLANT
TOWEL OR 17X24 6PK STRL BLUE (TOWEL DISPOSABLE) ×6 IMPLANT
TOWEL OR 17X26 10 PK STRL BLUE (TOWEL DISPOSABLE) ×5 IMPLANT
TRAY FOLEY SILVER 16FR TEMP (SET/KITS/TRAYS/PACK) ×3 IMPLANT
TROCAR XCEL BLADELESS 5X75MML (TROCAR) ×3 IMPLANT
TROCAR XCEL NON-BLD 11X100MML (ENDOMECHANICALS) ×6 IMPLANT
TUBE SUCT INTRACARD DLP 20F (MISCELLANEOUS) ×3 IMPLANT
TUNNELER SHEATH ON-Q 11GX8 DSP (PAIN MANAGEMENT) ×1 IMPLANT
UNDERPAD 30X30 (UNDERPADS AND DIAPERS) ×3 IMPLANT
WATER STERILE IRR 1000ML POUR (IV SOLUTION) ×6 IMPLANT
WIRE .035 3MM-J 145CM (WIRE) ×3 IMPLANT

## 2016-06-21 NOTE — Transfer of Care (Signed)
Immediate Anesthesia Transfer of Care Note  Patient: David Carr.  Procedure(s) Performed: Procedure(s): MINIMALLY INVASIVE MITRAL VALVE REPAIR (MVR) USING SORIN MEMO 3D SIZE 32 SEMIRIGID ANNULOPLASTY RING (N/A) TRANSESOPHAGEAL ECHOCARDIOGRAM (TEE) (N/A) PATENT FORAMEN OVALE (PFO) CLOSURE (N/A)  Patient Location: SICU  Anesthesia Type:General  Level of Consciousness: Patient remains intubated per anesthesia plan  Airway & Oxygen Therapy: Patient remains intubated per anesthesia plan and Patient placed on Ventilator (see vital sign flow sheet for setting)  Post-op Assessment: Report given to RN and Post -op Vital signs reviewed and stable  Post vital signs: Reviewed and stable  Last Vitals:  Vitals:   06/21/16 0627  BP: 126/72  Pulse: 68  Resp: 20  Temp: 36.8 C    Last Pain:  Vitals:   06/21/16 0627  TempSrc: Oral         Complications: No apparent anesthesia complications

## 2016-06-21 NOTE — Anesthesia Procedure Notes (Signed)
Arterial Line Insertion Start/End5/10/2016 8:25 AM, 06/21/2016 8:30 AM Performed by: Lillia Abed, anesthesiologist  Preanesthetic checklist: patient identified, IV checked, risks and benefits discussed, surgical consent, monitors and equipment checked, pre-op evaluation, timeout performed and anesthesia consent Patient sedated Left, radial was placed Hand hygiene performed  and Seldinger technique used  Attempts: 1 Procedure performed without using ultrasound guided technique. Following insertion, line sutured, dressing applied and Biopatch. Post procedure assessment: normal  Patient tolerated the procedure well with no immediate complications.

## 2016-06-21 NOTE — Brief Op Note (Signed)
06/21/2016  1:50 PM  PATIENT:  David Carr.  71 y.o. male  PRE-OPERATIVE DIAGNOSIS:  MR  POST-OPERATIVE DIAGNOSIS:  MR  PROCEDURE:  Procedure(s):  MINIMALLY INVASIVE MITRAL VALVE REPAIR  -Annuloplasty with a 39 Sorin Memo 3D Ring -Triangular Resection of Posterior Leaflet P2 -Placement of Gortex Neo Chord x 6   PATENT FORAMEN OVALE (PFO) CLOSURE (N/A)  TRANSESOPHAGEAL ECHOCARDIOGRAM (TEE) (N/A)  SURGEON:  Surgeon(s) and Role:    Rexene Alberts, MD - Primary  PHYSICIAN ASSISTANT: [place] Stegemann PA-C  ANESTHESIA:   general  EBL:  Total I/O In: 1800 [I.V.:1800] Out: 808 [Urine:875]  BLOOD ADMINISTERED: CELLSAVER  DRAINS: Right Pleural Chest Tubes   LOCAL MEDICATIONS USED:  NONE  SPECIMEN:  Source of Specimen:  Portion of Mitral Valve Leaflet  DISPOSITION OF SPECIMEN:  PATHOLOGY  COUNTS:  YES  TOURNIQUET:  * No tourniquets in log *  DICTATION: .Dragon Dictation  PLAN OF CARE: Admit to inpatient   PATIENT DISPOSITION:  ICU - intubated and hemodynamically stable.   Delay start of Pharmacological VTE agent (>24hrs) due to surgical blood loss or risk of bleeding: yes

## 2016-06-21 NOTE — Anesthesia Postprocedure Evaluation (Signed)
Anesthesia Post Note  Patient: David Carr.  Procedure(s) Performed: Procedure(s) (LRB): MINIMALLY INVASIVE MITRAL VALVE REPAIR (MVR) USING SORIN MEMO 3D SIZE 32 SEMIRIGID ANNULOPLASTY RING (N/A) TRANSESOPHAGEAL ECHOCARDIOGRAM (TEE) (N/A) PATENT FORAMEN OVALE (PFO) CLOSURE (N/A)  Patient location during evaluation: SICU Anesthesia Type: General Level of consciousness: sedated Pain management: pain level controlled Vital Signs Assessment: post-procedure vital signs reviewed and stable Respiratory status: patient remains intubated per anesthesia plan Cardiovascular status: stable Anesthetic complications: no       Last Vitals:  Vitals:   06/21/16 1640 06/21/16 1645  BP:  (!) 136/101  Pulse: 90 80  Resp: 16 14  Temp: (!) 35.2 C (!) 35.2 C    Last Pain:  Vitals:   06/21/16 0627  TempSrc: Oral                 [place] DAVID

## 2016-06-21 NOTE — Anesthesia Preprocedure Evaluation (Addendum)
Anesthesia Evaluation  Patient identified by MRN, date of birth, ID band Patient awake    Reviewed: Allergy & Precautions, NPO status , Patient's Chart, lab work & pertinent test results  Airway Mallampati: I  TM Distance: >3 FB Neck ROM: Full    Dental  (+) Edentulous Lower, Edentulous Upper   Pulmonary former smoker,    Pulmonary exam normal        Cardiovascular hypertension, Pt. on medications Normal cardiovascular exam     Neuro/Psych    GI/Hepatic GERD  Medicated and Controlled,  Endo/Other  diabetes, Type 2, Oral Hypoglycemic Agents  Renal/GU      Musculoskeletal   Abdominal   Peds  Hematology   Anesthesia Other Findings   Reproductive/Obstetrics                            Anesthesia Physical Anesthesia Plan  ASA: III  Anesthesia Plan: General   Post-op Pain Management:    Induction: Intravenous  Airway Management Planned: Double Lumen EBT  Additional Equipment: Arterial line, CVP, PA Cath, TEE, 3D TEE and Ultrasound Guidance Line Placement  Intra-op Plan:   Post-operative Plan: Post-operative intubation/ventilation  Informed Consent: I have reviewed the patients History and Physical, chart, labs and discussed the procedure including the risks, benefits and alternatives for the proposed anesthesia with the patient or authorized representative who has indicated his/her understanding and acceptance.     Plan Discussed with: CRNA and Surgeon  Anesthesia Plan Comments:         Anesthesia Quick Evaluation

## 2016-06-21 NOTE — Interval H&P Note (Signed)
History and Physical Interval Note:  06/21/2016 8:24 AM  David Carr.  has presented today for surgery, with the diagnosis of MR  The various methods of treatment have been discussed with the patient and family. After consideration of risks, benefits and other options for treatment, the patient has consented to  Procedure(s): MINIMALLY INVASIVE MITRAL VALVE REPAIR (MVR) (Right) TRANSESOPHAGEAL ECHOCARDIOGRAM (TEE) (N/A) as a surgical intervention .  The patient's history has been reviewed, patient examined, no change in status, stable for surgery.  I have reviewed the patient's chart and labs.  Questions were answered to the patient's satisfaction.     Rexene Alberts

## 2016-06-21 NOTE — Procedures (Signed)
Extubation Procedure Note  Patient Details:   Name: David Carr. DOB: 1945-08-07 MRN: [id number]   Airway Documentation:  Airway 7.5 mm (Active)  Secured at (cm) 22 cm 06/21/2016  9:29 PM  Measured From Lips 06/21/2016  9:29 PM  [place] 06/21/2016  9:29 PM  Secured By [employer] 06/21/2016  9:29 PM  Tube Holder Repositioned Yes 06/21/2016  9:29 PM  Site Condition Dry 06/21/2016  9:29 PM    Evaluation  O2 sats: stable throughout Complications: No apparent complications Patient did tolerate procedure well. Bilateral Breath Sounds: Rhonchi, Diminished   Yes   Patient was extubated to 4 LPM nasal cannula. Patient had a cuff leak. NIF was -38 and VC was 1.5. Patient had good effort. Patient was able to speak name and no stridor was noted. RT will continue to monitor.   Bedford Winsor M 06/21/2016, 10:47 PM

## 2016-06-21 NOTE — Anesthesia Procedure Notes (Signed)
Central Venous Catheter Insertion Performed by: Lillia Abed, anesthesiologist Start/End5/10/2016 7:50 AM, 06/21/2016 8:00 AM Patient location: Pre-op. Preanesthetic checklist: patient identified, IV checked, risks and benefits discussed, surgical consent, monitors and equipment checked, pre-op evaluation, timeout performed and anesthesia consent Position: Trendelenburg Lidocaine 1% used for infiltration and patient sedated Hand hygiene performed  and maximum sterile barriers used  Catheter size: 8.5 Fr PA cath was placed.Sheath introducer Procedure performed using ultrasound guided technique. Ultrasound Notes:anatomy identified, needle tip was noted to be adjacent to the nerve/plexus identified, no ultrasound evidence of intravascular and/or intraneural injection and image(s) printed for medical record Attempts: 1 Following insertion, line sutured and dressing applied. Post procedure assessment: blood return through all ports, free fluid flow and no air  Patient tolerated the procedure well with no immediate complications.

## 2016-06-21 NOTE — Progress Notes (Signed)
  Echocardiogram Echocardiogram Transesophageal has been performed.  Bobbye Charleston 06/21/2016, 9:48 AM

## 2016-06-21 NOTE — Op Note (Signed)
CARDIOTHORACIC SURGERY OPERATIVE NOTE  Date of Procedure:  06/21/2016  Preoperative Diagnosis:   Severe Mitral Regurgitation  Patent Foramen Ovale  Postoperative Diagnosis: Same  Procedure:    Minimally-Invasive Mitral Valve Repair  Complex valvuloplasty including triangular resection of flail segment of posterior leaflet  Artificial Gore-tex Neochord placement x6  Sorin Memo 3D Ring Annuloplasty (size 79mm, catalog # [id number], serial # [id number])   Closure of Patent Foramen Ovale    Surgeon: Valentina Gu. 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  Multiple ruptured primary chordae tendineae with flail segment (P2) of posterior leaflet  Type II mitral valve dysfunction with severe mitral regurgitation  Normal left ventricular systolic function  Patent foramen ovale  No residual mitral regurgitation after successful valve repair                   BRIEF CLINICAL NOTE AND INDICATIONS FOR SURGERY  Patient is a 71 year old male with history of mitral valve prolapse with mitral regurgitation, hypertension, type II diabetes,acute myeloid leukemiain remission, hyperlipidemia, and kidney stones who has been referred for surgical consultation to discuss management options for mitral valve prolapse with severe primary mitral regurgitation. The patient states that he was first noted to have a heart murmur on physical exam performed by his primary care physician many years ago. He was referred to Dr. Percival Spanish and has undergone transthoracic echocardiogram regularly in follow-up. 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 He recovered from this uneventfully and has remained in remission since. Last fall he underwent routine follow-up echocardiogram which revealed what appeared to be significant progression of  severity of mitral regurgitation. Transesophageal echocardiogram was performed 01/11/2016 and confirmed the presence of a flail segment involving the middle portion of the posterior leaflet with ruptured chordae tendineae and severe mitral regurgitation. Left ventricular systolic function remained normal with ejection fraction estimated 55-60%. There was left atrial chamber enlargement. There was a small patent foramen ovale. He was referred for elective surgical consultation.  The patient has been seen in consultation and counseled at length regarding the indications, risks and potential benefits of surgery.  All questions have been answered, and the patient provides full informed consent for the operation as described.    DETAILS OF THE OPERATIVE PROCEDURE  Preparation:  The patient is brought to the operating room on the above mentioned date and central monitoring was established by the anesthesia team including placement of Swan-Ganz catheter through the left internal jugular vein.  A radial arterial line is placed. The patient is placed in the supine position on the operating table.  Intravenous antibiotics are administered. General endotracheal anesthesia is induced uneventfully. The patient is initially intubated using a dual lumen endotracheal tube.  A Foley catheter is placed.  Baseline transesophageal echocardiogram was performed.  Findings were notable for mitral valve prolapse with an obvious flail segment involving the middle scallop (P2) of the posterior leaflet. There were multiple ruptured chordae tendineae. There was severe mitral regurgitation. There was normal left ventricular systolic function. The aortic valve appeared normal. Right ventricular size and function was normal. There was mild tricuspid regurgitation. Patent foramen ovale was not demonstrated using color imaging. Bubble study was not performed.  A soft roll is placed behind the patient's left scapula and the neck gently  extended and turned to the left.   The patient's right neck, chest, abdomen, both groins,  and both lower extremities are prepared and draped in a sterile manner. A time out procedure is performed.  Surgical Approach:  A right miniature anterolateral thoracotomy incision is performed. The incision is placed just lateral to and superior to the right nipple. The pectoralis major muscle is retracted medially and completely preserved. The right pleural space is entered through the 4th intercostal space. A soft tissue retractor is placed.  Two 11 mm ports are placed through separate stab incisions inferiorly. The right pleural space is insufflated continuously with carbon dioxide gas through the posterior port during the remainder of the operation.  A pledgeted sutures placed through the dome of the right hemidiaphragm and retracted inferiorly to facilitate exposure.  A longitudinal incision is made in the pericardium 3 cm anterior to the phrenic nerve and silk traction sutures are placed on either side of the incision for exposure.   Extracorporeal Cardiopulmonary Bypass and Myocardial Protection:  A small incision is made in the right inguinal crease and the anterior surface of the right common femoral artery and right common femoral vein are identified.  The patient is placed in Trendelenburg position. The right internal jugular vein is cannulated with Seldinger technique and a guidewire advanced into the right atrium. The patient is heparinized systemically. The right internal jugular vein is cannulated with a 14 [place] pediatric femoral venous cannula. Pursestring sutures are placed on the anterior surface of the right common femoral vein and right common femoral artery. The right common femoral vein is cannulated with the Seldinger technique and a guidewire is advanced under transesophageal echocardiogram guidance through the right atrium. The femoral vein is cannulated with a long 22 French femoral venous  cannula. The right common femoral artery is cannulated with Seldinger technique and a flexible guidewire is advanced until it can be appreciated intraluminally in the descending thoracic aorta on transesophageal echocardiogram. The femoral artery is cannulated with an 18 French femoral arterial cannula.  Adequate heparinization is verified.     The entire pre-bypass portion of the operation was notable for stable hemodynamics.  Cardiopulmonary bypass was begun.  Vacuum assist venous drainage is utilized. The incision in the pericardium is extended in both directions. Venous drainage and exposure are notably excellent. A retrograde cardioplegia cannula is placed through the right atrium into the coronary sinus using transesophageal echocardiogram guidance.  An antegrade cardioplegia cannula is placed in the ascending aorta.    The patient is cooled to 28C systemic temperature.  The aortic cross clamp is applied and cardioplegia is delivered initially in an antegrade fashion through the aortic root using modified [place] cold blood cardioplegia (KBC protocol).   The initial cardioplegic arrest is rapid with early diastolic arrest.  Repeat doses of cardioplegia are administered at 90 minutes through the coronary sinus catheter in order to maintain completely flat electrocardiogram.  Myocardial protection was felt to be excellent.   Closure of Patent Foramen Ovale:  A left atriotomy incision was performed through the interatrial groove and extended partially across the back wall of the left atrium after opening the oblique sinus inferiorly.  The left atrial surface of the intra-atrial septum was examined. A patent foramen ovale was easily identified. The patent foramen ovale was closed using running 4-0 Prolene suture.   Mitral Valve Repair:  The mitral valve is exposed using a self-retaining retractor.  The mitral valve was inspected and notable for fibroelastic deficiency type myxomatous degenerative  disease of the mitral valve. There was an obvious flail segment involving a  portion of the middle scallop (P2) of the posterior leaflet with multiple ruptured primary chordae tendineae. There was no annular calcification in the remainder of the valve appeared essentially normal.  Interrupted 2-0 Ethibond horizontal mattress sutures are placed circumferentially around the entire mitral valve annulus. The sutures will ultimately be utilized for ring annuloplasty, and at this juncture there are utilized to suspend the valve symmetrically.  Flail segment of the posterior leaflet was repaired using a simple triangular resection.  A total of approximately 20% of the surface area of P2 was resected. The intervening vertical defect in the posterior leaflet was closed using interrupted everting simple CV 5 Gore-Tex suture.  Artificial neochord placement was performed using Chord-X multi-strand CV-4 Goretex pre-measured loops.  The appropriate cord length was measured from corresponding normal length primary cords from the P3 segment of the posterior leaflet. The papillary muscle suture of the Chord-X multi-strand suture was placed through the head of the anterior papillary muscle in a horizontal mattress fashion and tied over Teflon felt pledgets. Each of the three pre-measured loops were then reimplanted into the free margin of the P2 segment of the posterior leaflet.    The valve was tested with saline and appeared competent even without ring annuloplasty complete. The valve was sized to a 32 mm annuloplasty ring, based upon the transverse distance between the left and right commissures and the height of the anterior leaflet, corresponding to a size just slightly larger than the overall surface area of the anterior leaflet.  A Sorin Memo 3D annuloplasty ring (size 32 mm, catalog [id number], serial [id number]) was secured in place uneventfully. All ring sutures were secured using a Cor-knot device.  The valve was tested  with saline and appeared competent.   The valve is again tested with saline and appears to be perfectly competent with a broad symmetrical line of coaptation of the anterior and posterior leaflet. There is no residual leak. There was a broad, symmetrical line of coaptation of the anterior and posterior leaflet which was confirmed using the blue ink test.  Rewarming is begun.   Procedure Completion:  The atriotomy was closed using a 2-layer closure of running 3-0 Prolene suture after placing a sump drain across the mitral valve to serve as a left ventricular vent.  One final dose of warm retrograde "reanimation dose" cardioplegia was administered retrograde through the coronary sinus catheter while all air was evacuated through the aortic root.  The aortic cross clamp was removed after a total cross clamp time of 122 minutes.  Epicardial pacing wires are fixed to the inferior wall of the right ventricule and to the right atrial appendage. The patient is rewarmed to 37C temperature. The left ventricular vent is removed.  The patient is ventilated and flow volumes turndown while the mitral valve repair is inspected using transesophageal echocardiogram. The valve repair appears intact with no residual leak. The antegrade cardioplegia cannula is now removed. The patient is weaned and disconnected from cardiopulmonary bypass.  The patient's rhythm at separation from bypass was sinus.  The patient was weaned from bypass without any inotropic support. Total cardiopulmonary bypass time for the operation was 184 minutes.  Followup transesophageal echocardiogram performed after separation from bypass revealed a well-seated annuloplasty ring in the mitral position with a normal functioning mitral valve. There was no residual leak.  Left ventricular function was unchanged from preoperatively.  The mean gradient across the mitral valve was estimated to be 3 mmHg.  The femoral arterial and venous cannulae  were  removed uneventfully. There was a palpable pulse in the distal right common femoral artery after removal of the cannula. Protamine was administered to reverse the anticoagulation. The right internal jugular cannula was removed and manual pressure held on the neck for 15 minutes.  Single lung ventilation was begun. The atriotomy closure was inspected for hemostasis. The pericardial sac was drained using a 28 French Bard drain placed through the anterior port incision.  The pericardium was closed using a patch of core matrix bovine submucosal tissue patch. The right pleural space is irrigated with saline solution and inspected for hemostasis. The right pleural space was drained using a 28 French Bard drain placed through the posterior port incision. The miniature thoracotomy incision was closed in multiple layers in routine fashion. The right groin incision was inspected for hemostasis and closed in multiple layers in routine fashion.  The post-bypass portion of the operation was notable for stable rhythm and hemodynamics.  The patient received 2 units packed red blood cells during the procedure due to anemia which was present preoperatively and exacerbated by acute blood loss and hemodilution during cardiopulmonary bypass.   Disposition:  The patient tolerated the procedure well.  The patient was reintubated using a single lumen endotracheal tube and subsequently transported to the surgical intensive care unit in stable condition. There were no intraoperative complications. All sponge instrument and needle counts are verified correct at completion of the operation.     Valentina Gu. Roxy Manns MD 06/21/2016 3:44 PM

## 2016-06-21 NOTE — Anesthesia Procedure Notes (Signed)
Procedure Name: Intubation Date/Time: 06/21/2016 9:57 AM Performed by: Lillia Abed Pre-anesthesia Checklist: Patient identified, Emergency Drugs available, Suction available and Patient being monitored Patient Re-evaluated:Patient Re-evaluated prior to inductionOxygen Delivery Method: Circle System Utilized Preoxygenation: Pre-oxygenation with 100% oxygen Intubation Type: IV induction Ventilation: Oral airway inserted - appropriate to patient size and Mask ventilation without difficulty Laryngoscope Size: Mac and 4 Grade View: Grade I Tube type: Oral Endobronchial tube: Left and 39 Fr Number of attempts: 1 Airway Equipment and Method: Stylet and Oral airway Placement Confirmation: ETT inserted through vocal cords under direct vision,  positive ETCO2 and breath sounds checked- equal and bilateral Secured at: 30 cm Tube secured with: Tape Dental Injury: Teeth and Oropharynx as per pre-operative assessment

## 2016-06-21 NOTE — Progress Notes (Signed)
      [address]       [place], [address]             [phone]      s/p Mitral repair  Intubated, sedated  BP (!) 136/101   Pulse 80   Temp (!) 95.4 F (35.2 C)   Resp 14   Ht 5\' 8"  (1.727 m)   Wt 195 lb (88.5 kg)   SpO2 99%   BMI 29.65 kg/m    Intake/Output Summary (Last 24 hours) at 06/21/16 1709 Last data filed at 06/21/16 1531  Gross per 24 hour  Intake             3683 ml  Output             2255 ml  Net             1428 ml   Initial CO low on arrival to SICU- volume being given. Will need NTG  Follow closely  Remo Lipps C. Roxan Hockey, MD [facility] [phone]

## 2016-06-21 NOTE — Anesthesia Procedure Notes (Signed)
Procedure Name: Intubation Date/Time: 06/21/2016 4:03 PM Performed by: Trixie Deis A Pre-anesthesia Checklist: Patient identified, Emergency Drugs available, Suction available and Patient being monitored Patient Re-evaluated:Patient Re-evaluated prior to inductionIntubation Type: Inhalational induction with existing ETT and Combination inhalational/ intravenous induction Laryngoscope Size: Glidescope and 4 Grade View: Grade I Tube type: Subglottic suction tube Tube size: 7.5 mm Intubation method: cook exchange catheter with glidescope. Placement Confirmation: ETT inserted through vocal cords under direct vision,  positive ETCO2 and breath sounds checked- equal and bilateral Secured at: 22 cm Tube secured with: Tape Dental Injury: Teeth and Oropharynx as per pre-operative assessment

## 2016-06-22 ENCOUNTER — Inpatient Hospital Stay (HOSPITAL_COMMUNITY): Payer: 59

## 2016-06-22 ENCOUNTER — Encounter (HOSPITAL_COMMUNITY): Payer: Self-pay | Admitting: Thoracic Surgery (Cardiothoracic Vascular Surgery)

## 2016-06-22 LAB — POCT I-STAT, CHEM 8
BUN: 6 mg/dL (ref 6–20)
CALCIUM ION: 1.03 mmol/L — AB (ref 1.15–1.40)
CREATININE: 0.7 mg/dL (ref 0.61–1.24)
Chloride: 103 mmol/L (ref 101–111)
Glucose, Bld: 208 mg/dL — ABNORMAL HIGH (ref 65–99)
HEMATOCRIT: 31 % — AB (ref 39.0–52.0)
HEMOGLOBIN: 10.5 g/dL — AB (ref 13.0–17.0)
Potassium: 4.2 mmol/L (ref 3.5–5.1)
SODIUM: 133 mmol/L — AB (ref 135–145)
TCO2: 21 mmol/L (ref 0–100)

## 2016-06-22 LAB — BASIC METABOLIC PANEL
Anion gap: 7 (ref 5–15)
BUN: 6 mg/dL (ref 6–20)
CO2: 22 mmol/L (ref 22–32)
CREATININE: 0.76 mg/dL (ref 0.61–1.24)
Calcium: 6.9 mg/dL — ABNORMAL LOW (ref 8.9–10.3)
Chloride: 108 mmol/L (ref 101–111)
GFR calc Af Amer: >60 mL/min (ref >60)
Glucose, Bld: 111 mg/dL — ABNORMAL HIGH (ref 65–99)
Potassium: 3.5 mmol/L (ref 3.5–5.1)
SODIUM: 137 mmol/L (ref 135–145)

## 2016-06-22 LAB — CBC
HCT: 31.4 % — ABNORMAL LOW (ref 39.0–52.0)
HCT: 32.8 % — ABNORMAL LOW (ref 39.0–52.0)
HEMOGLOBIN: 10.8 g/dL — AB (ref 13.0–17.0)
Hemoglobin: 11.5 g/dL — ABNORMAL LOW (ref 13.0–17.0)
MCH: 30.5 pg (ref 26.0–34.0)
MCH: 31.9 pg (ref 26.0–34.0)
MCHC: 34.4 g/dL (ref 30.0–36.0)
MCHC: 35.1 g/dL (ref 30.0–36.0)
MCV: 88.7 fL (ref 78.0–100.0)
MCV: 90.9 fL (ref 78.0–100.0)
Platelets: 89 10*3/uL — ABNORMAL LOW (ref 150–400)
Platelets: 81 10*3/uL — ABNORMAL LOW (ref 150–400)
RBC: 3.54 MIL/uL — ABNORMAL LOW (ref 4.22–5.81)
RBC: 3.61 MIL/uL — ABNORMAL LOW (ref 4.22–5.81)
RDW: 15.6 % — AB (ref 11.5–15.5)
RDW: 15.9 % — AB (ref 11.5–15.5)
WBC: 11.2 10*3/uL — ABNORMAL HIGH (ref 4.0–10.5)
WBC: 13.9 10*3/uL — ABNORMAL HIGH (ref 4.0–10.5)

## 2016-06-22 LAB — GLUCOSE, CAPILLARY
GLUCOSE-CAPILLARY: 94 mg/dL (ref 65–99)
GLUCOSE-CAPILLARY: 96 mg/dL (ref 65–99)
GLUCOSE-CAPILLARY: 105 mg/dL — AB (ref 65–99)
GLUCOSE-CAPILLARY: 115 mg/dL — AB (ref 65–99)
GLUCOSE-CAPILLARY: 107 mg/dL — AB (ref 65–99)
GLUCOSE-CAPILLARY: 137 mg/dL — AB (ref 65–99)
Glucose-Capillary: 100 mg/dL — ABNORMAL HIGH (ref 65–99)
Glucose-Capillary: 100 mg/dL — ABNORMAL HIGH (ref 65–99)
Glucose-Capillary: 103 mg/dL — ABNORMAL HIGH (ref 65–99)
Glucose-Capillary: 114 mg/dL — ABNORMAL HIGH (ref 65–99)
Glucose-Capillary: 120 mg/dL — ABNORMAL HIGH (ref 65–99)
Glucose-Capillary: 142 mg/dL — ABNORMAL HIGH (ref 65–99)
Glucose-Capillary: 148 mg/dL — ABNORMAL HIGH (ref 65–99)
Glucose-Capillary: 173 mg/dL — ABNORMAL HIGH (ref 65–99)
Glucose-Capillary: 92 mg/dL (ref 65–99)

## 2016-06-22 LAB — CREATININE, SERUM
CREATININE: 0.89 mg/dL (ref 0.61–1.24)
GFR calc Af Amer: >60 mL/min (ref >60)

## 2016-06-22 LAB — COOXEMETRY PANEL
CARBOXYHEMOGLOBIN: 1.7 % — AB (ref 0.5–1.5)
METHEMOGLOBIN: 0.8 % (ref 0.0–1.5)
O2 Saturation: 63.3 %
TOTAL HEMOGLOBIN: 11.2 g/dL — AB (ref 12.0–16.0)

## 2016-06-22 LAB — MAGNESIUM
MAGNESIUM: 2.3 mg/dL (ref 1.7–2.4)
MAGNESIUM: 1.9 mg/dL (ref 1.7–2.4)

## 2016-06-22 MED ORDER — POTASSIUM CHLORIDE 10 MEQ/50ML IV SOLN
10.0000 meq | INTRAVENOUS | Status: AC
  Administered 2016-06-22 (×3): 10 meq via INTRAVENOUS
  Filled 2016-06-22 (×3): qty 50

## 2016-06-22 MED ORDER — WARFARIN - PHYSICIAN DOSING INPATIENT: Freq: Every day | Status: DC

## 2016-06-22 MED ORDER — WARFARIN - PHARMACIST DOSING INPATIENT: Freq: Every day | Status: DC

## 2016-06-22 MED ORDER — WARFARIN SODIUM 2.5 MG PO TABS
2.5000 mg | ORAL_TABLET | Freq: Every day | ORAL | Status: DC
  Administered 2016-06-22: 2.5 mg via ORAL
  Filled 2016-06-22: qty 1

## 2016-06-22 MED ORDER — INSULIN DETEMIR 100 UNIT/ML ~~LOC~~ SOLN
20.0000 [IU] | Freq: Once | SUBCUTANEOUS | Status: DC
  Filled 2016-06-22 (×2): qty 0.2

## 2016-06-22 MED ORDER — INSULIN DETEMIR 100 UNIT/ML ~~LOC~~ SOLN
20.0000 [IU] | Freq: Every day | SUBCUTANEOUS | Status: DC
  Filled 2016-06-22: qty 0.2

## 2016-06-22 MED ORDER — INSULIN ASPART 100 UNIT/ML ~~LOC~~ SOLN
0.0000 [IU] | SUBCUTANEOUS | Status: DC
  Administered 2016-06-22: 2 [IU] via SUBCUTANEOUS
  Administered 2016-06-22: 4 [IU] via SUBCUTANEOUS
  Administered 2016-06-22: 2 [IU] via SUBCUTANEOUS
  Administered 2016-06-23: 4 [IU] via SUBCUTANEOUS

## 2016-06-22 MED FILL — Heparin Sodium (Porcine) Inj 1000 Unit/ML: INTRAMUSCULAR | Qty: 20 | Status: AC

## 2016-06-22 MED FILL — Dexmedetomidine HCl in NaCl 0.9% IV Soln 400 MCG/100ML: INTRAVENOUS | Qty: 100 | Status: AC

## 2016-06-22 MED FILL — Electrolyte-R (PH 7.4) Solution: INTRAVENOUS | Qty: 9000 | Status: AC

## 2016-06-22 MED FILL — Sodium Bicarbonate IV Soln 8.4%: INTRAVENOUS | Qty: 50 | Status: AC

## 2016-06-22 MED FILL — Sodium Chloride IV Soln 0.9%: INTRAVENOUS | Qty: 2000 | Status: AC

## 2016-06-22 MED FILL — Mannitol IV Soln 20%: INTRAVENOUS | Qty: 500 | Status: AC

## 2016-06-22 MED FILL — Lidocaine HCl IV Inj 20 MG/ML: INTRAVENOUS | Qty: 5 | Status: AC

## 2016-06-22 NOTE — Progress Notes (Signed)
Patient ID: David Carr., male   DOB: May 20, 1945, 71 y.o.   MRN: [id number]   SICU Evening Rounds:   Hemodynamically stable  Dopamine and milrinone off, on 20 mcg neo.  Awake and alert.  Urine output good  CT output low  CBC    Component Value Date/Time   WBC 13.9 (H) 06/22/2016 1540   RBC 3.61 (L) 06/22/2016 1540   HGB 10.5 (L) 06/22/2016 1554   HGB 14.9 06/01/2016 1358   HCT 31.0 (L) 06/22/2016 1554   HCT 43.3 06/01/2016 1358   PLT 81 (L) 06/22/2016 1540   PLT 149 06/01/2016 1358   MCV 90.9 06/22/2016 1540   MCV 94.4 06/01/2016 1358   MCH 31.9 06/22/2016 1540   MCHC 35.1 06/22/2016 1540   RDW 15.9 (H) 06/22/2016 1540   RDW 15.4 (H) 06/01/2016 1358   LYMPHSABS 1.1 06/01/2016 1358   MONOABS 0.4 06/01/2016 1358   EOSABS 0.0 06/01/2016 1358   BASOSABS 0.0 06/01/2016 1358     BMET    Component Value Date/Time   NA 133 (L) 06/22/2016 1554   NA 138 12/02/2015 1335   K 4.2 06/22/2016 1554   K 4.1 12/02/2015 1335   CL 103 06/22/2016 1554   CO2 22 06/22/2016 0301   CO2 24 12/02/2015 1335   GLUCOSE 208 (H) 06/22/2016 1554   GLUCOSE 252 (H) 12/02/2015 1335   BUN 6 06/22/2016 1554   BUN 14.2 12/02/2015 1335   CREATININE 0.70 06/22/2016 1554   CREATININE 0.90 01/03/2016 1056   CREATININE 1.0 12/02/2015 1335   CALCIUM 6.9 (L) 06/22/2016 0301   CALCIUM 9.0 12/02/2015 1335   GFRNONAA >60 06/22/2016 1540   GFRAA >60 06/22/2016 1540     A/P:  Stable postop course. Continue current plans. Wean neo as tolerated.

## 2016-06-22 NOTE — Progress Notes (Addendum)
[address]       [place] [address]             [phone]        CARDIOTHORACIC SURGERY PROGRESS NOTE   R1 Day Post-Op Procedure(s) (LRB): MINIMALLY INVASIVE MITRAL VALVE REPAIR (MVR) USING SORIN MEMO 3D SIZE 32 SEMIRIGID ANNULOPLASTY RING (N/A) TRANSESOPHAGEAL ECHOCARDIOGRAM (TEE) (N/A) PATENT FORAMEN OVALE (PFO) CLOSURE (N/A)  Subjective: Looks good.  Feels sore in chest.  Otherwise no complaints.  Anxious.  Objective: Vital signs: BP Readings from Last 1 Encounters:  06/22/16 104/67   Pulse Readings from Last 1 Encounters:  06/22/16 95   Resp Readings from Last 1 Encounters:  06/22/16 17   Temp Readings from Last 1 Encounters:  06/22/16 100 F (37.8 C)    Hemodynamics: PAP: (24-43)/(12-30) 32/16 CO:  [2.7 L/min-6.7 L/min] 6.7 L/min CI:  [1.3 L/min/m2-3.3 L/min/m2] 3.2 L/min/m2  Mixed venous co-ox 63.3%   Physical Exam:  Rhythm:   sinus  Breath sounds: clear  Heart sounds:  RRR w/out murmur  Incisions:  Dressings dry, intact  Abdomen:  Soft, non-distended, non-tender  Extremities:  Warm, well-perfused  Chest tubes:  low volume thin serosanguinous output, no air leak    Intake/Output from previous day: 05/09 0701 - 05/10 0700 In: 4765.4 [P.O.:50; I.V.:4429.2; Blood:1133; NG/GT:30; IV Piggyback:1320] Out: 6503 [Urine:5200; Blood:900; Chest Tube:690] Intake/Output this shift: Total I/O In: 106.3 [I.V.:6.3; IV Piggyback:100] Out: 350 [Urine:250; Chest Tube:100]  Lab Results:  CBC: Recent Labs  06/21/16 2230 06/22/16 0301  WBC 11.9* 11.2*  HGB 11.5* 10.8*  HCT 33.3* 31.4*  PLT 92* 89*    BMET:  Recent Labs  06/19/16 1056  06/21/16 2227 06/21/16 2230 06/22/16 0301  NA 137  < > 139  --  137  K 4.0  < > 4.6  --  3.5  CL 104  < > 106  --  108  CO2 22  --   --   --  22  GLUCOSE 232*  < > 144*  --  111*  BUN 8  < > 8  --  6  CREATININE 0.95  < > 0.60* 0.73 0.76  CALCIUM 9.2  --   --   --  6.9*  < > = values in  this interval not displayed.   PT/INR:   Recent Labs  06/21/16 1639  LABPROT 17.8*  INR 1.45    CBG (last 3)   Recent Labs  06/22/16 0402 06/22/16 0536 06/22/16 0649  GLUCAP 94 96 92    ABG    Component Value Date/Time   PHART 7.404 06/21/2016 2351   PCO2ART 40.9 06/21/2016 2351   PO2ART 103.0 06/21/2016 2351   HCO3 25.4 06/21/2016 2351   TCO2 27 06/21/2016 2351   O2SAT 63.3 06/22/2016 0236    CXR: PORTABLE CHEST 1 VIEW  COMPARISON:  Portable chest x-ray of Jun 21, 2016  FINDINGS: There has been interval extubation of the trachea and of the esophagus. The right lung remains hypoinflated. There is no pneumothorax or pneumomediastinum or significant pleural effusion. There is improving subsegmental atelectasis in the mid and lower lung. The 2 right-sided chest tubes are in stable position. There is persistent left lower lobe density. The cardiac silhouette remains enlarged. The pulmonary vascularity is normal. The Swan-Ganz catheter tip projects in the proximal main pulmonary outflow tract. A prosthetic mitral valve ring is visible.  IMPRESSION: Interval extubation of the trachea and esophagus. Improving subsegmental atelectasis on  the right. No pneumothorax or significant pleural effusion.  Improving left lower lobe atelectasis. Persistent small left pleural effusion.  The chest tubes and Swan-Ganz catheter are in stable position.   Electronically Signed   By: David  Martinique M.D.   On: 06/22/2016 08:04  EKG: NSR w/out acute ischemic changes     Assessment/Plan: S/P Procedure(s) (LRB): MINIMALLY INVASIVE MITRAL VALVE REPAIR (MVR) USING SORIN MEMO 3D SIZE 32 SEMIRIGID ANNULOPLASTY RING (N/A) TRANSESOPHAGEAL ECHOCARDIOGRAM (TEE) (N/A) PATENT FORAMEN OVALE (PFO) CLOSURE (N/A)  Overall doing well POD1 Maintaining NSR w/ stable hemodynamics on low dose milrinone, dopamine and Neo drips Breathing comfortably w/ O2 sats 98% on 4 L/min Expected  post op volume excess, mild Expected post op acute blood loss anemia, mild Post op thrombocytopenia with chronic thrombocytopenia preop Expected post op atelectasis, mild Type II diabetes mellitus, excellent glycemic control on insulin drip   Wean milrinone off  Wean dopamine and Neo as tolerated  Mobilize  D/C lines  Start coumadin slowly  Add levemir and wean insulin drip   Rexene Alberts, MD 06/22/2016 8:30 AM

## 2016-06-23 ENCOUNTER — Inpatient Hospital Stay (HOSPITAL_COMMUNITY): Payer: 59

## 2016-06-23 LAB — BASIC METABOLIC PANEL
Anion gap: 4 — ABNORMAL LOW (ref 5–15)
BUN: 6 mg/dL (ref 6–20)
CALCIUM: 7.5 mg/dL — AB (ref 8.9–10.3)
CO2: 25 mmol/L (ref 22–32)
CREATININE: 0.76 mg/dL (ref 0.61–1.24)
Chloride: 105 mmol/L (ref 101–111)
GFR calc Af Amer: >60 mL/min (ref >60)
Glucose, Bld: 119 mg/dL — ABNORMAL HIGH (ref 65–99)
POTASSIUM: 4 mmol/L (ref 3.5–5.1)
SODIUM: 134 mmol/L — AB (ref 135–145)

## 2016-06-23 LAB — CBC
HCT: 30.8 % — ABNORMAL LOW (ref 39.0–52.0)
Hemoglobin: 10.2 g/dL — ABNORMAL LOW (ref 13.0–17.0)
MCH: 30.4 pg (ref 26.0–34.0)
MCHC: 33.1 g/dL (ref 30.0–36.0)
MCV: 91.7 fL (ref 78.0–100.0)
PLATELETS: 54 10*3/uL — AB (ref 150–400)
RBC: 3.36 MIL/uL — ABNORMAL LOW (ref 4.22–5.81)
RDW: 15.9 % — AB (ref 11.5–15.5)
WBC: 7.3 10*3/uL (ref 4.0–10.5)

## 2016-06-23 LAB — GLUCOSE, CAPILLARY
GLUCOSE-CAPILLARY: 181 mg/dL — AB (ref 65–99)
Glucose-Capillary: 115 mg/dL — ABNORMAL HIGH (ref 65–99)
Glucose-Capillary: 121 mg/dL — ABNORMAL HIGH (ref 65–99)
Glucose-Capillary: 234 mg/dL — ABNORMAL HIGH (ref 65–99)

## 2016-06-23 LAB — PROTIME-INR
INR: 1.55
PROTHROMBIN TIME: 18.8 s — AB (ref 11.4–15.2)

## 2016-06-23 MED ORDER — SODIUM CHLORIDE 0.9 % IV SOLN: 250.0000 mL | INTRAVENOUS | Status: DC | PRN

## 2016-06-23 MED ORDER — FUROSEMIDE 40 MG PO TABS
40.0000 mg | ORAL_TABLET | Freq: Two times a day (BID) | ORAL | Status: DC
  Administered 2016-06-24 (×2): 40 mg via ORAL
  Filled 2016-06-23 (×2): qty 1

## 2016-06-23 MED ORDER — FUROSEMIDE 40 MG PO TABS: 40.0000 mg | ORAL_TABLET | Freq: Two times a day (BID) | ORAL | Status: DC

## 2016-06-23 MED ORDER — ASPIRIN EC 81 MG PO TBEC
81.0000 mg | DELAYED_RELEASE_TABLET | Freq: Every day | ORAL | Status: DC
  Administered 2016-06-23 – 2016-06-25 (×3): 81 mg via ORAL
  Filled 2016-06-23 (×3): qty 1

## 2016-06-23 MED ORDER — WARFARIN VIDEO
Freq: Once | Status: AC
  Administered 2016-06-24: 13:00:00

## 2016-06-23 MED ORDER — FUROSEMIDE 10 MG/ML IJ SOLN
40.0000 mg | Freq: Two times a day (BID) | INTRAMUSCULAR | Status: AC
  Administered 2016-06-23 (×2): 40 mg via INTRAVENOUS
  Filled 2016-06-23 (×2): qty 4

## 2016-06-23 MED ORDER — SIMVASTATIN 20 MG PO TABS
20.0000 mg | ORAL_TABLET | Freq: Every day | ORAL | Status: DC
  Administered 2016-06-25: 20 mg via ORAL
  Filled 2016-06-23: qty 1

## 2016-06-23 MED ORDER — CHLORHEXIDINE GLUCONATE 0.12 % MT SOLN
5.0000 mL | Freq: Two times a day (BID) | OROMUCOSAL | Status: DC
  Administered 2016-06-23 – 2016-06-26 (×7): 5 mL via OROMUCOSAL
  Filled 2016-06-23 (×7): qty 15

## 2016-06-23 MED ORDER — MOVING RIGHT ALONG BOOK
Freq: Once | Status: AC
  Administered 2016-06-23: 09:00:00
  Filled 2016-06-23: qty 1

## 2016-06-23 MED ORDER — COUMADIN BOOK
Freq: Once | Status: AC
  Administered 2016-06-24: 13:00:00
  Filled 2016-06-23 (×2): qty 1

## 2016-06-23 MED ORDER — SODIUM CHLORIDE 0.9% FLUSH
3.0000 mL | Freq: Two times a day (BID) | INTRAVENOUS | Status: DC
  Administered 2016-06-23 – 2016-06-25 (×6): 3 mL via INTRAVENOUS

## 2016-06-23 MED ORDER — ATENOLOL 25 MG PO TABS
12.5000 mg | ORAL_TABLET | Freq: Every day | ORAL | Status: DC
  Administered 2016-06-23 – 2016-06-25 (×3): 12.5 mg via ORAL
  Filled 2016-06-23 (×3): qty 1

## 2016-06-23 MED ORDER — POTASSIUM CHLORIDE CRYS ER 20 MEQ PO TBCR
20.0000 meq | EXTENDED_RELEASE_TABLET | Freq: Two times a day (BID) | ORAL | Status: DC
  Administered 2016-06-24 (×2): 20 meq via ORAL
  Filled 2016-06-23 (×2): qty 1

## 2016-06-23 MED ORDER — INSULIN GLARGINE 100 UNIT/ML ~~LOC~~ SOLN
30.0000 [IU] | Freq: Every day | SUBCUTANEOUS | Status: DC
  Administered 2016-06-24 – 2016-06-25 (×2): 30 [IU] via SUBCUTANEOUS
  Filled 2016-06-23 (×3): qty 0.3

## 2016-06-23 MED ORDER — SODIUM CHLORIDE 0.9% FLUSH: 3.0000 mL | INTRAVENOUS | Status: DC | PRN

## 2016-06-23 MED ORDER — WARFARIN SODIUM 1 MG PO TABS
1.0000 mg | ORAL_TABLET | Freq: Every day | ORAL | Status: DC
  Administered 2016-06-23: 1 mg via ORAL
  Filled 2016-06-23: qty 1

## 2016-06-23 MED ORDER — INSULIN ASPART 100 UNIT/ML ~~LOC~~ SOLN
0.0000 [IU] | Freq: Three times a day (TID) | SUBCUTANEOUS | Status: DC
  Administered 2016-06-23: 8 [IU] via SUBCUTANEOUS
  Administered 2016-06-23: 4 [IU] via SUBCUTANEOUS
  Administered 2016-06-24: 2 [IU] via SUBCUTANEOUS
  Administered 2016-06-24: 8 [IU] via SUBCUTANEOUS
  Administered 2016-06-25 (×2): 4 [IU] via SUBCUTANEOUS
  Administered 2016-06-26: 2 [IU] via SUBCUTANEOUS

## 2016-06-23 NOTE — Progress Notes (Addendum)
[address]       [place] [address]             [phone]        CARDIOTHORACIC SURGERY PROGRESS NOTE   R2 Days Post-Op Procedure(s) (LRB): MINIMALLY INVASIVE MITRAL VALVE REPAIR (MVR) USING SORIN MEMO 3D SIZE 32 SEMIRIGID ANNULOPLASTY RING (N/A) TRANSESOPHAGEAL ECHOCARDIOGRAM (TEE) (N/A) PATENT FORAMEN OVALE (PFO) CLOSURE (N/A)  Subjective: Feels better.  Ambulated once this morning.  Poor appetite and mild soreness in chest.  Otherwise no complaints.  Objective: Vital signs: BP Readings from Last 1 Encounters:  06/23/16 123/77   Pulse Readings from Last 1 Encounters:  06/23/16 86   Resp Readings from Last 1 Encounters:  06/23/16 17   Temp Readings from Last 1 Encounters:  06/23/16 97.9 F (36.6 C) (Oral)    Hemodynamics: PAP: (28-35)/(15-18) 35/15 CO:  [5.7 L/min-6.5 L/min] 5.7 L/min CI:  [2.8 L/min/m2-3.2 L/min/m2] 2.8 L/min/m2  Physical Exam:  Rhythm:   sinus  Breath sounds: clear  Heart sounds:  RRR w/out murmur  Incisions:  Dressings dry, intact  Abdomen:  Soft, non-distended, non-tender  Extremities:  Warm, well-perfused  Chest tubes:  Decreasing but significant volume thin serosanguinous output, no air leak    Intake/Output from previous day: 05/10 0701 - 05/11 0700 In: 1558.7 [P.O.:270; I.V.:988.7; IV Piggyback:300] Out: 2840 [Urine:2170; Chest Tube:670] Intake/Output this shift: Total I/O In: 60 [I.V.:10; IV Piggyback:50] Out: 30 [Chest Tube:30]  Lab Results:  CBC: Recent Labs  06/22/16 1540 06/22/16 1554 06/23/16 0350  WBC 13.9*  --  7.3  HGB 11.5* 10.5* 10.2*  HCT 32.8* 31.0* 30.8*  PLT 81*  --  54*    BMET:  Recent Labs  06/22/16 0301  06/22/16 1554 06/23/16 0350  NA 137  --  133* 134*  K 3.5  --  4.2 4.0  CL 108  --  103 105  CO2 22  --   --  25  GLUCOSE 111*  --  208* 119*  BUN 6  --  6 6  CREATININE 0.76  < > 0.70 0.76  CALCIUM 6.9*  --   --  7.5*  < > = values in this interval not  displayed.   PT/INR:   Recent Labs  06/23/16 0350  LABPROT 18.8*  INR 1.55    CBG (last 3)   Recent Labs  06/22/16 1948 06/22/16 2333 06/23/16 0814  GLUCAP 173* 148* 181*    ABG    Component Value Date/Time   PHART 7.404 06/21/2016 2351   PCO2ART 40.9 06/21/2016 2351   PO2ART 103.0 06/21/2016 2351   HCO3 25.4 06/21/2016 2351   TCO2 21 06/22/2016 1554   O2SAT 63.3 06/22/2016 0236    CXR: PORTABLE CHEST 1 VIEW  COMPARISON:  06/22/2016  FINDINGS: Since the prior study, the Swan-Ganz catheter has been removed. The left internal jugular introducer sheath remains in place, well positioned.  Right-sided chest tube is stable with its tip near the right apex.  No convincing pneumothorax.  No mediastinal widening.  There is lung base opacity consistent with atelectasis, unchanged from the previous day's study. Lungs otherwise clear.  IMPRESSION: 1. No acute findings or evidence of an operative complication. 2. Remaining support apparatus is stable in well positioned. 3. Persistent lung base atelectasis.   Electronically Signed   By: Lajean Manes M.D.   On: 06/23/2016 08:05   Assessment/Plan: S/P Procedure(s) (LRB): MINIMALLY INVASIVE MITRAL VALVE REPAIR (MVR) USING SORIN MEMO  3D SIZE 32 SEMIRIGID ANNULOPLASTY RING (N/A) TRANSESOPHAGEAL ECHOCARDIOGRAM (TEE) (N/A) PATENT FORAMEN OVALE (PFO) CLOSURE (N/A)  Doing well POD2 Maintaining NSR w/ stable BP off all drips Breathing comfortably w/ O2 sats 92-96% on room air Expected post op volume excess, mild Expected post op acute blood loss anemia, mild, stable Post op thrombocytopenia with chronic thrombocytopenia preop, platelet count down to 54k Expected post op atelectasis, mild Type II diabetes mellitus, adequate glycemic control off insulin drip   Mobilize  Diuresis  Change CBG's and SSI to AC/HS and resume home dose Basaglar insulin, hold metformin for now  Leave chest tubes in for  now  Watch platelet count - no heparin or lovenox  Transfer step down   Rexene Alberts, MD 06/23/2016 8:38 AM

## 2016-06-23 NOTE — Progress Notes (Signed)
1330 Came to see pt to walk. Pt has walked twice today. Walked part way over to 2W. Will follow up tomorrow. Graylon Good RN BSN 06/23/2016 1:36 PM

## 2016-06-23 NOTE — Discharge Summary (Signed)
Physician Discharge Summary  Patient ID: David Carr. MRN: [id number] DOB/AGE: [birth year] 71 y.o.  Admit date: 06/21/2016 Discharge date: 06/26/2016  Admission Diagnoses:  Patient Active Problem List   Diagnosis Date Noted  . Non-rheumatic mitral regurgitation   . Zoster 12/02/2015  . Obesity (BMI 30.0-34.9) 09/03/2015  . Pancytopenia, acquired ([place]) 05/28/2015  . Nasal drainage 02/12/2014  . Leukopenia due to antineoplastic chemotherapy ([place]) 01/30/2014  . Other fatigue 01/19/2014  . Chills (without fever) 01/19/2014  . 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 Thrombocytopenia ([place]) 09/04/2013  . Anemia in neoplastic disease 09/04/2013  . Anemia, unspecified 08/28/2013  . Chest pain 07/10/2013  . Dysplastic nevus of face 08/09/2010  . Overweight 01/19/2009  . VERTIGO, POSITIONAL 03/18/2008  . MITRAL REGURGITATION 07/16/2007  . Type 2 diabetes mellitus with hypoglycemia without coma, with long-term current use of insulin ([place]) 05/31/2007  . Hyperlipidemia 05/31/2007  . Essential hypertension 05/31/2007  . ALLERGIC RHINITIS 05/31/2007  . SEBORRHEIC KERATOSIS 05/31/2007  . SHOULDER PAIN, RIGHT 05/31/2007   Discharge Diagnoses:   Patient Active Problem List   Diagnosis Date Noted  . 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  . Non-rheumatic mitral regurgitation   . Zoster 12/02/2015  . Obesity (BMI 30.0-34.9) 09/03/2015  . Pancytopenia, acquired ([place]) 05/28/2015  . Nasal drainage 02/12/2014  . Leukopenia due to antineoplastic chemotherapy ([place]) 01/30/2014  . Other fatigue 01/19/2014  . Chills (without fever) 01/19/2014  . AML (acute myeloid leukemia) in remission ([place]) 11/14/2013  . Thrombocytopenia ([place]) 09/04/2013  . Anemia in neoplastic disease 09/04/2013  . Anemia, unspecified 08/28/2013  . Chest pain 07/10/2013  . Dysplastic nevus of face 08/09/2010  . Overweight 01/19/2009  . VERTIGO, POSITIONAL 03/18/2008  . MITRAL  REGURGITATION 07/16/2007  . Type 2 diabetes mellitus with hypoglycemia without coma, with long-term current use of insulin ([place]) 05/31/2007  . Hyperlipidemia 05/31/2007  . Essential hypertension 05/31/2007  . ALLERGIC RHINITIS 05/31/2007  . SEBORRHEIC KERATOSIS 05/31/2007  . SHOULDER PAIN, RIGHT 05/31/2007   Discharged Condition: good  History of Present Illness:  Mr. David Carr is a 71 yo male with known history of mitral valve prolapse with mitral regurgitation, hypertension, type II diabetes,acute myeloid leukemiain remission, hyperlipidemia, and kidney stones.  The patient states that he was first noted to have a heart murmur on physical exam performed by his primary care physician many years ago. He was referred to Dr. Percival Spanish and has undergone transthoracic echocardiogram regularly in follow-up. In 2015 he developed AML and underwent chemotherapy at [facility]. He recovered from this uneventfully and has remained in remission since. Last fall he underwent routine follow-up echocardiogram which revealed what appeared to be significant progression of severity of mitral regurgitation. Transesophageal echocardiogram was performed 01/11/2016 and confirmed the presence of a flail segment involving the middle portion of the posterior leaflet with ruptured chordae tendineae and severe mitral regurgitation. Left ventricular systolic function remained normal with ejection fraction estimated 55-60%. There was left atrial chamber enlargement. There was a small patent foramen ovale. He was referred for elective surgical consultation.  He was evaluated by Dr. Roxy Manns who was in agreement the patient should undergo intervention on his aortic valve.  The risks and benefits of the procedure were explained to the patient and he was agreeable to proceed.  He would require dental clearance prior to his surgery.    Hospital Course:   Mr. David Carr presented to [facility] on 06/21/2016.  He was  taken to the  operating room and underwent Minimally Invasive Mitral Valve Repair with valvuloplasty, ring annuloplasty, and placement of Neo Chord.  He also underwent closure of a patent foramen ovale.  He tolerated the procedure without difficulty and was taken to the SICU in stable condition.  The patient was extubated the evening of surgery.  During his stay in the SICU the patient was weaned off Milrinone, Dopamine, and Neo-Synephrine drips.  He has a history of thrombocytopenia which worsened post operatively.  He was not treated with post operative Heparin due to this.  His last platelet count was up to 61,000. He was started on Coumadin slowly for his mitral valve repair.  He was maintaining NSR and felt medically stable for transfer to the floor in stable condition.  The patient continued to make progress.  His chest tubes were removed on 06/24/2016.  He remains on Coumadin.  His INR is 1.41.  He will be discharged home on 2.5 mg of Coumadin.  He will need to have a PT/INR check on 06/28/2016.  He developed ventricular trigeminy.  His potassium and magnesium level were check and were 3.5/1.9.  He was treated with potassium and magnesium supplementation.  His pacing wires have been removed without difficulty.  He is ambulating without difficulty.  He is tolerating a heart healthy diet.  He is felt medically stable for discharge home today.     Significant Diagnostic Studies: cardiac graphics:   Echocardiogram:   - Left ventricle: The cavity size was normal. Systolic function was   normal. The estimated ejection fraction was in the range of 55%   to 60%. Doppler parameters are consistent with abnormal left   ventricular relaxation (grade 1 diastolic dysfunction). - Mitral valve: Moderate prolapse, involving the posterior leaflet.   Probable flail motion involving the posterior leaflet. There was   mild regurgitation. There appears to be a partial flail of the   posterior MY leaflet. Visually MR  appears mild at worst but   density of signal on CW Doppler suggests much more severe MR.   Would suggest TEE for further evaluation. Reviewed with Dr.   Aundra Dubin.  Treatments: surgery:    Minimally-Invasive Mitral Valve Repair             Complex valvuloplasty including triangular resection of flail segment of posterior leaflet             Artificial Gore-tex Neochord placement x6             Sorin Memo 3D Ring Annuloplasty (size 14mm, catalog # [id number], serial # [id number])   Closure of Patent Foramen Ovale  Disposition: 01-Home or Self Care   Discharge Medications:  The patient has been discharged on:   1.Beta Blocker:  Yes [  x ]                              No   [   ]                              If No, reason:  2.Ace Inhibitor/ARB: Yes [   ]                                     No  [  x  ]  If No, reason: labile BP  3.Statin:   Yes [ x  ]                  No  [   ]                  If No, reason:  4.Ecasa:  Yes  [ x  ]                  No   [   ]                  If No, reason:     Discharge Instructions    Amb Referral to Cardiac Rehabilitation    Complete by:  As directed    Diagnosis:  Valve Repair   Valve:  Mitral Comment - mini     Allergies as of 06/26/2016      Reactions   Prochlorperazine Other (See Comments)   ARRHYTHMIAS   Morphine And Related    Shuts bladder down.      Medication List    STOP taking these medications   oxyCODONE-acetaminophen 5-325 MG tablet Commonly known as:  PERCOCET     TAKE these medications   acetaminophen 500 MG tablet Commonly known as:  TYLENOL Take 2 tablets (1,000 mg total) by mouth every 6 (six) hours as needed.   aspirin 81 MG tablet Take 81 mg by mouth daily with supper.   atenolol 25 MG tablet Commonly known as:  TENORMIN Take 1 tablet (25 mg total) by mouth daily. Start taking on:  06/27/2016 What changed:  how much to take   BASAGLAR KWIKPEN 100 UNIT/ML  Sopn INJECT 30 UNITS INTO THE SKIN AT BEDTIME.   chlorhexidine 0.12 % solution Commonly known as:  PERIDEX Rinse with 15 mls twice daily for 30 seconds. Use after breakfast and at bedtime. Spit out excess. Do not swallow.   furosemide 40 MG tablet Commonly known as:  LASIX Take 1 tablet (40 mg total) by mouth daily. Start taking on:  06/27/2016   magnesium oxide 400 (241.3 Mg) MG tablet Commonly known as:  MAG-OX Take 1 tablet (400 mg total) by mouth 2 (two) times daily.   metFORMIN 500 MG 24 hr tablet Commonly known as:  GLUCOPHAGE-XR Take 1 tablet (500 mg total) by mouth daily with supper. What changed:  how much to take   potassium chloride SA 20 MEQ tablet Commonly known as:  K-DUR,KLOR-CON Take 2 tablets (40 mEq total) by mouth daily. Start taking on:  06/27/2016   simvastatin 20 MG tablet Commonly known as:  ZOCOR Take 1 tablet (20 mg total) by mouth at bedtime.   traMADol 50 MG tablet Commonly known as:  ULTRAM Take 1 tablet (50 mg total) by mouth every 4 (four) hours as needed for moderate pain.   warfarin 2.5 MG tablet Commonly known as:  COUMADIN Take 1 tablet (2.5 mg total) by mouth daily at 6 PM.      Follow-up Information    [facility] [place] Follow up on 07/17/2016.   Specialty:  Cardiothoracic Surgery Why:  Appointment is at 1:15, please get CXR at 12:45 at [facility] located on first floor of our office building Contact information: [place], [place], Evelene Croon, PA-C Follow up on 07/03/2016.   Specialties:  Cardiology, Radiology Why:  @ 11am Contact information:  [address] [place] [address] [phone]        [facility] [address] Go on 06/27/2016.   Specialty:  Cardiology Why:  @ 8:15 am for your coumadin clinic appointment. Contact information: [place] [id number] [place] [phone]          Signed: Caryn Bee 06/26/2016, 2:56 PM

## 2016-06-23 NOTE — Care Management Note (Signed)
Case Management Note Marvetta Gibbons RN, BSN Unit 2W-Case Manager [phone]  Patient Details  Name: David Carr. MRN: [id number] Date of Birth: 1945/07/24  Subjective/Objective:   Pt admitted s/p Mini MVR on 06/21/16                 Action/Plan: PTA pt lived at home with  Spouse- anticipate return home- pt tx from [facility] to 2W on 06/23/16 - CM to follow for d/c needs  Expected Discharge Date:                  Expected Discharge Plan:  Home/Self Care  In-House Referral:     Discharge planning Services  CM Consult  Post Acute Care Choice:    Choice offered to:     DME Arranged:    DME Agency:     HH Arranged:    [place] Agency:     Status of Service:  In process, will continue to follow  If discussed at Long Length of Stay Meetings, dates discussed:    Discharge Disposition:   Additional Comments:  Dawayne Patricia, RN 06/23/2016, 12:08 PM

## 2016-06-24 LAB — BASIC METABOLIC PANEL
Anion gap: 8 (ref 5–15)
BUN: 8 mg/dL (ref 6–20)
CALCIUM: 7.9 mg/dL — AB (ref 8.9–10.3)
CO2: 29 mmol/L (ref 22–32)
CREATININE: 0.85 mg/dL (ref 0.61–1.24)
Chloride: 101 mmol/L (ref 101–111)
GFR calc Af Amer: >60 mL/min (ref >60)
GLUCOSE: 100 mg/dL — AB (ref 65–99)
Potassium: 3.5 mmol/L (ref 3.5–5.1)
Sodium: 138 mmol/L (ref 135–145)

## 2016-06-24 LAB — CBC
HCT: 31.3 % — ABNORMAL LOW (ref 39.0–52.0)
Hemoglobin: 10.4 g/dL — ABNORMAL LOW (ref 13.0–17.0)
MCH: 30.5 pg (ref 26.0–34.0)
MCHC: 33.2 g/dL (ref 30.0–36.0)
MCV: 91.8 fL (ref 78.0–100.0)
PLATELETS: 61 10*3/uL — AB (ref 150–400)
RBC: 3.41 MIL/uL — ABNORMAL LOW (ref 4.22–5.81)
RDW: 15.4 % (ref 11.5–15.5)
WBC: 5.9 10*3/uL (ref 4.0–10.5)

## 2016-06-24 LAB — GLUCOSE, CAPILLARY
GLUCOSE-CAPILLARY: 134 mg/dL — AB (ref 65–99)
Glucose-Capillary: 121 mg/dL — ABNORMAL HIGH (ref 65–99)
Glucose-Capillary: 168 mg/dL — ABNORMAL HIGH (ref 65–99)
Glucose-Capillary: 238 mg/dL — ABNORMAL HIGH (ref 65–99)

## 2016-06-24 LAB — PROTIME-INR
INR: 1.36
Prothrombin Time: 16.9 seconds — ABNORMAL HIGH (ref 11.4–15.2)

## 2016-06-24 MED ORDER — POTASSIUM CHLORIDE CRYS ER 20 MEQ PO TBCR
40.0000 meq | EXTENDED_RELEASE_TABLET | Freq: Once | ORAL | Status: AC
  Administered 2016-06-24: 40 meq via ORAL
  Filled 2016-06-24: qty 2

## 2016-06-24 MED ORDER — TRAMADOL HCL 50 MG PO TABS: 50.0000 mg | ORAL_TABLET | ORAL | Status: DC | PRN

## 2016-06-24 MED ORDER — WARFARIN SODIUM 2 MG PO TABS
2.0000 mg | ORAL_TABLET | Freq: Every day | ORAL | Status: DC
  Administered 2016-06-24: 2 mg via ORAL
  Filled 2016-06-24: qty 1

## 2016-06-24 NOTE — Progress Notes (Addendum)
      [address]       [place] [address]             [phone]        3 Days Post-Op Procedure(s) (LRB): MINIMALLY INVASIVE MITRAL VALVE REPAIR (MVR) USING SORIN MEMO 3D SIZE 32 SEMIRIGID ANNULOPLASTY RING (N/A) TRANSESOPHAGEAL ECHOCARDIOGRAM (TEE) (N/A) PATENT FORAMEN OVALE (PFO) CLOSURE (N/A)  Subjective: Patient with no bowel movement yet.  Objective: Vital signs in last 24 hours: Temp:  [97.9 F (36.6 C)-99.1 F (37.3 C)] 98.9 F (37.2 C) (05/12 0412) Pulse Rate:  [84-92] 84 (05/12 0412) Cardiac Rhythm: Heart block (05/12 0700) Resp:  [16-18] 18 (05/12 0412) BP: (95-124)/(60-84) 124/69 (05/12 0412) SpO2:  [90 %-96 %] 92 % (05/12 0412) Weight:  [89.7 kg (197 lb 12.8 oz)] 89.7 kg (197 lb 12.8 oz) (05/12 0412)  Pre op weight 88.5 kg Current Weight  06/24/16 89.7 kg (197 lb 12.8 oz)       Intake/Output from previous day: 05/11 0701 - 05/12 0700 In: 290 [P.O.:220; I.V.:20; IV Piggyback:50] Out: 9935 [Urine:1600; Chest Tube:136]   Physical Exam:  Cardiovascular: RRR, no murmur Pulmonary: Slightly diminished at bases Abdomen: Soft, non tender, bowel sounds present. Extremities: Mild bilateral lower extremity edema. Wounds: Clean and dry.  No erythema or signs of infection.  Lab Results: CBC: Recent Labs  06/23/16 0350 06/24/16 0310  WBC 7.3 5.9  HGB 10.2* 10.4*  HCT 30.8* 31.3*  PLT 54* 61*   BMET:  Recent Labs  06/23/16 0350 06/24/16 0310  NA 134* 138  K 4.0 3.5  CL 105 101  CO2 25 29  GLUCOSE 119* 100*  BUN 6 8  CREATININE 0.76 0.85  CALCIUM 7.5* 7.9*    PT/INR:  Lab Results  Component Value Date   INR 1.36 06/24/2016   INR 1.55 06/23/2016   INR 1.45 06/21/2016   ABG:  INR: Will add last result for INR, ABG once components are confirmed Will add last 4 CBG results once components are confirmed  Assessment/Plan:  1. CV - SR in the 80's this am. On Atenolol 12.5 mg daily and Coumadin. INR slightly decreased  from 1.55 to 1.36. Will give Coumadin 2 mg tonight. 2.  Pulmonary - On room air. Chest tubes with 136 cc last 24 hours. No CXR ordered for today. Remove EPW then chest tubes. Will order PA/LAT for am. Encourage incentive spirometer. 3. Volume Overload - On Lasix 40 mg bid. 4.  Acute blood loss anemia - H and H stable at 10.4 and 31.3 5. DM-CBGs 121/234/121. On Insulin. Will restart Metformin when tolerates diet better. Pre op HGA1C 6.4 6. Thrombocytopenia-platelets slightly increased from 54,000 to 61,000 7. Supplement potassium 8. Per patient request, no laxative until chest tubes removed. 9. Discharge 1-2 days  ZIMMERMAN,DONIELLE MPA-C 06/24/2016,7:32 AM  I have seen and examined the patient and agree with the assessment and plan as outlined.  Rexene Alberts, MD 06/24/2016 11:53 AM

## 2016-06-24 NOTE — Progress Notes (Signed)
CARDIAC REHAB PHASE I    At 10:40am pt refused ambulation. Pt walked with his RN at 11:40.  Pt returned to bed, education completed including exercise guidelines, sternal precautions, nutrition and outpatient cardiac rehab. Pt accepting of referral to [facility].    [employer]

## 2016-06-24 NOTE — Progress Notes (Signed)
dced chest tubes pt. tolerated well

## 2016-06-24 NOTE — Progress Notes (Deleted)
Nurse Jeneen Rinks reports patient has had at least 190 cc of chest tube output (likely more since pleura vac changed yesterday) last 24 hours. Will discuss with Dr. Roxy Manns if should still remove.

## 2016-06-24 NOTE — Progress Notes (Signed)
dc'ed pacing wires pt. tolerated well 

## 2016-06-25 ENCOUNTER — Inpatient Hospital Stay (HOSPITAL_COMMUNITY): Payer: 59

## 2016-06-25 LAB — TYPE AND SCREEN
ABO/RH(D): A POS
Antibody Screen: NEGATIVE
UNIT DIVISION: 0
UNIT DIVISION: 0
Unit division: 0
Unit division: 0
Unit division: 0
Unit division: 0

## 2016-06-25 LAB — PROTIME-INR
INR: 1.41
Prothrombin Time: 17.4 seconds — ABNORMAL HIGH (ref 11.4–15.2)

## 2016-06-25 LAB — BPAM RBC
BLOOD PRODUCT EXPIRATION DATE: 201805242359
BLOOD PRODUCT EXPIRATION DATE: 201805242359
BLOOD PRODUCT EXPIRATION DATE: 201806012359
Blood Product Expiration Date: 201805232359
Blood Product Expiration Date: 201805232359
Blood Product Expiration Date: 201805242359
ISSUE DATE / TIME: 201805091221
ISSUE DATE / TIME: 201805091221
ISSUE DATE / TIME: 201805091221
ISSUE DATE / TIME: 201805091221
UNIT TYPE AND RH: 6200
Unit Type and Rh: 6200
Unit Type and Rh: 6200
Unit Type and Rh: 6200
Unit Type and Rh: 6200
Unit Type and Rh: 6200

## 2016-06-25 LAB — GLUCOSE, CAPILLARY
GLUCOSE-CAPILLARY: 113 mg/dL — AB (ref 65–99)
GLUCOSE-CAPILLARY: 182 mg/dL — AB (ref 65–99)
GLUCOSE-CAPILLARY: 140 mg/dL — AB (ref 65–99)
Glucose-Capillary: 94 mg/dL (ref 65–99)

## 2016-06-25 MED ORDER — FUROSEMIDE 40 MG PO TABS
40.0000 mg | ORAL_TABLET | Freq: Every day | ORAL | Status: DC
  Administered 2016-06-25 – 2016-06-26 (×2): 40 mg via ORAL
  Filled 2016-06-25 (×2): qty 1

## 2016-06-25 MED ORDER — WARFARIN SODIUM 2.5 MG PO TABS
2.5000 mg | ORAL_TABLET | Freq: Every day | ORAL | Status: DC
  Administered 2016-06-25: 2.5 mg via ORAL
  Filled 2016-06-25: qty 1

## 2016-06-25 MED ORDER — POTASSIUM CHLORIDE CRYS ER 20 MEQ PO TBCR
20.0000 meq | EXTENDED_RELEASE_TABLET | Freq: Every day | ORAL | Status: DC
  Administered 2016-06-25 – 2016-06-26 (×2): 20 meq via ORAL
  Filled 2016-06-25 (×2): qty 1

## 2016-06-25 MED ORDER — METFORMIN HCL 500 MG PO TABS
500.0000 mg | ORAL_TABLET | Freq: Every day | ORAL | Status: DC
  Administered 2016-06-25: 500 mg via ORAL
  Filled 2016-06-25: qty 1

## 2016-06-25 MED ORDER — LACTULOSE 10 GM/15ML PO SOLN
20.0000 g | Freq: Once | ORAL | Status: AC
  Administered 2016-06-25: 20 g via ORAL
  Filled 2016-06-25: qty 30

## 2016-06-25 NOTE — Progress Notes (Addendum)
      [address]       [place] [address]             [phone]        4 Days Post-Op Procedure(s) (LRB): MINIMALLY INVASIVE MITRAL VALVE REPAIR (MVR) USING SORIN MEMO 3D SIZE 32 SEMIRIGID ANNULOPLASTY RING (N/A) TRANSESOPHAGEAL ECHOCARDIOGRAM (TEE) (N/A) PATENT FORAMEN OVALE (PFO) CLOSURE (N/A)  Subjective: Patient with no bowel movement yet.   Objective: Vital signs in last 24 hours: Temp:  [98.4 F (36.9 C)-98.6 F (37 C)] 98.4 F (36.9 C) (05/13 0414) Pulse Rate:  [77-82] 77 (05/13 0414) Cardiac Rhythm: Heart block (05/13 0700) Resp:  [18] 18 (05/13 0414) BP: (102-128)/(62-68) 102/62 (05/13 0414) SpO2:  [94 %-97 %] 94 % (05/13 0414) Weight:  [86 kg (189 lb 8 oz)] 86 kg (189 lb 8 oz) (05/13 0414)  Pre op weight 88.5 kg Current Weight  06/25/16 86 kg (189 lb 8 oz)       Intake/Output from previous day: 05/12 0701 - 05/13 0700 In: 480 [P.O.:480] Out: 1510 [Urine:1450; Chest Tube:60]   Physical Exam:  Cardiovascular: RRR, no murmur Pulmonary: Slightly diminished at bases Abdomen: Soft, non tender, bowel sounds present. Extremities: Trace bilateral lower extremity edema. Wounds: Clean and dry.  No erythema or signs of infection.  Lab Results: CBC:  Recent Labs  06/23/16 0350 06/24/16 0310  WBC 7.3 5.9  HGB 10.2* 10.4*  HCT 30.8* 31.3*  PLT 54* 61*   BMET:   Recent Labs  06/23/16 0350 06/24/16 0310  NA 134* 138  K 4.0 3.5  CL 105 101  CO2 25 29  GLUCOSE 119* 100*  BUN 6 8  CREATININE 0.76 0.85  CALCIUM 7.5* 7.9*    PT/INR:  Lab Results  Component Value Date   INR 1.41 06/25/2016   INR 1.36 06/24/2016   INR 1.55 06/23/2016   ABG:  INR: Will add last result for INR, ABG once components are confirmed Will add last 4 CBG results once components are confirmed  Assessment/Plan:  1. CV - SR in the 70's this am. On Atenolol 12.5 mg daily and Coumadin. INR slightly increased from 1.36 to 1.41. Will give Coumadin  2.5 mg tonight. 2.  Pulmonary - On room air. CXR this am shows no pneumothorax, mild bibasilar atelectasis and pleural effusions. Encourage incentive spirometer. 3. Volume Overload - On Lasix 40 mg bid. Will decrease to daily. 4.  Acute blood loss anemia - Last H and H stable at 10.4 and 31.3 5. DM-CBGs 168/238/113. On Insulin. Will restart low dose Metformin. . Pre op HGA1C 6.4 6. Thrombocytopenia-last platelets slightly increased from 54,000 to 61,000 7. LOC constipation 8. Likely discharge in am  ZIMMERMAN,DONIELLE MPA-C 06/25/2016,7:38 AM  I have seen and examined the patient and agree with the assessment and plan as outlined.  Rexene Alberts, MD 06/25/2016 11:01 AM

## 2016-06-25 NOTE — Discharge Instructions (Signed)
Information on my medicine - Coumadin   (Warfarin)  This medication education was reviewed with me or my healthcare representative as part of my discharge preparation.  The pharmacist that spoke with me during my hospital stay was:  Wayland Salinas, [facility]  Why was Coumadin prescribed for you? Coumadin was prescribed for you because you have a blood clot or a medical condition that can cause an increased risk of forming blood clots. Blood clots can cause serious health problems by blocking the flow of blood to the heart, lung, or brain. Coumadin can prevent harmful blood clots from forming. As a reminder your indication for Coumadin is:   Blood Clot Prevention After Heart Valve Surgery  What test will check on my response to Coumadin? While on Coumadin (warfarin) you will need to have an INR test regularly to ensure that your dose is keeping you in the desired range. The INR (international normalized ratio) number is calculated from the result of the laboratory test called prothrombin time (PT).  If an INR APPOINTMENT HAS NOT ALREADY BEEN MADE FOR YOU please schedule an appointment to have this lab work done by your health care provider within 7 days. Your INR goal is usually a number between:  2 to 3 or your provider may give you a more narrow range like 2-2.5.  Ask your health care provider during an office visit what your goal INR is.  What  do you need to  know  About  COUMADIN? Take Coumadin (warfarin) exactly as prescribed by your healthcare provider about the same time each day.  DO NOT stop taking without talking to the doctor who prescribed the medication.  Stopping without other blood clot prevention medication to take the place of Coumadin may increase your risk of developing a new clot or stroke.  Get refills before you run out.  What do you do if you miss a dose? If you miss a dose, take it as soon as you remember on the same day then continue your regularly scheduled  regimen the next day.  Do not take two doses of Coumadin at the same time.  Important Safety Information A possible side effect of Coumadin (Warfarin) is an increased risk of bleeding. You should call your healthcare provider right away if you experience any of the following: ? Bleeding from an injury or your nose that does not stop. ? Unusual colored urine (red or dark brown) or unusual colored stools (red or black). ? Unusual bruising for unknown reasons. ? A serious fall or if you hit your head (even if there is no bleeding).  Some foods or medicines interact with Coumadin (warfarin) and might alter your response to warfarin. To help avoid this: ? Eat a balanced diet, maintaining a consistent amount of Vitamin K. ? Notify your provider about major diet changes you plan to make. ? Avoid alcohol or limit your intake to 1 drink for women and 2 drinks for men per day. (1 drink is 5 oz. wine, 12 oz. beer, or 1.5 oz. liquor.)  Make sure that ANY health care provider who prescribes medication for you knows that you are taking Coumadin (warfarin).  Also make sure the healthcare provider who is monitoring your Coumadin knows when you have started a new medication including herbals and non-prescription products.  Coumadin (Warfarin)  Major Drug Interactions  Increased Warfarin Effect Decreased Warfarin Effect  Alcohol (large quantities) Antibiotics (esp. Septra/Bactrim, Flagyl, Cipro) Amiodarone (Cordarone) Aspirin (ASA) Cimetidine (Tagamet) Megestrol (Megace) NSAIDs (  ibuprofen, naproxen, etc.) Piroxicam (Feldene) Propafenone (Rythmol SR) Propranolol (Inderal) Isoniazid (INH) Posaconazole (Noxafil) Barbiturates (Phenobarbital) Carbamazepine (Tegretol) Chlordiazepoxide (Librium) Cholestyramine (Questran) Griseofulvin Oral Contraceptives Rifampin Sucralfate (Carafate) Vitamin K   Coumadin (Warfarin) Major Herbal Interactions  Increased Warfarin Effect Decreased Warfarin Effect    Garlic Ginseng Ginkgo biloba Coenzyme Q10 Green tea St. Johns wort    Coumadin (Warfarin) FOOD Interactions  Eat a consistent number of servings per week of foods HIGH in Vitamin K (1 serving =  cup)  Collards (cooked, or boiled & drained) Kale (cooked, or boiled & drained) Mustard greens (cooked, or boiled & drained) Parsley *serving size only =  cup Spinach (cooked, or boiled & drained) Swiss chard (cooked, or boiled & drained) Turnip greens (cooked, or boiled & drained)  Eat a consistent number of servings per week of foods MEDIUM-HIGH in Vitamin K (1 serving = 1 cup)  Asparagus (cooked, or boiled & drained) Broccoli (cooked, boiled & drained, or raw & chopped) Brussel sprouts (cooked, or boiled & drained) *serving size only =  cup Lettuce, raw (green leaf, endive, romaine) Spinach, raw Turnip greens, raw & chopped   These websites have more information on Coumadin (warfarin):  FailFactory.se; VeganReport.com.au;       Vitamin K Foods and Warfarin Warfarin is a blood thinner (anticoagulant). Anticoagulant medicines help prevent the formation of blood clots. These medicines work by decreasing the activity of vitamin K, which promotes normal blood clotting. When you take warfarin, problems can occur from suddenly increasing or decreasing the amount of vitamin K that you eat from one day to the next. Problems may include:  Blood clots.  Bleeding. What general guidelines do I need to follow? To avoid problems when taking warfarin:  Eat a balanced diet that includes:  Fresh fruits and vegetables.  Whole grains.  Low-fat dairy products.  Lean proteins, such as fish, eggs, and lean cuts of meat.  Keep your intake of vitamin K consistent from day to day. To do this:  Avoid eating large amounts of vitamin K one day and low amounts of vitamin K the next day.  If you take a multivitamin that contains vitamin K, be sure to take it every  day.  Know which foods contain vitamin K. Use the lists below to understand serving sizes and the amount of vitamin K in one serving.  Avoid major changes in your diet. If you are going to change your diet, talk with your health care provider before making changes.  Work with a Financial planner (dietitian) to develop a meal plan that works best for you. High vitamin K foods Foods that are high in vitamin K contain more than 100 mcg (micrograms) per serving. These include:  Broccoli (cooked) -  cup has 110 mcg.  Brussels sprouts (cooked) -  cup has 109 mcg.  Greens, beet (cooked) -  cup has 350 mcg.  Greens, collard (cooked) -  cup has 418 mcg.  Greens, turnip (cooked) -  cup has 265 mcg.  Green onions or scallions -  cup has 105 mcg.  Kale (fresh or frozen) -  cup has 531 mcg.  Parsley (raw) - 10 sprigs has 164 mcg.  Spinach (cooked) -  cup has 444 mcg.  Swiss chard (cooked) -  cup has 287 mcg. Moderate vitamin K foods Foods that have a moderate amount of vitamin K contain 25-100 mcg per serving. These include:  Asparagus (cooked) - 5 spears have 38 mcg.  Black-eyed peas (dried) -  cup  has 32 mcg.  Cabbage (cooked) -  cup has 37 mcg.  Kiwi fruit - 1 medium has 31 mcg.  Lettuce - 1 cup has 57-63 mcg.  Okra (frozen) -  cup has 44 mcg.  Prunes (dried) - 5 prunes have 25 mcg.  Watercress (raw) - 1 cup has 85 mcg. Low vitamin K foods Foods low in vitamin K contain less than 25 mcg per serving. These include:  Artichoke - 1 medium has 18 mcg.  Avocado - 1 oz. has 6 mcg.  Blueberries -  cup has 14 mcg.  Cabbage (raw) -  cup has 21 mcg.  Carrots (cooked) -  cup has 11 mcg.  Cauliflower (raw) -  cup has 11 mcg.  Cucumber with peel (raw) -  cup has 9 mcg.  Grapes -  cup has 12 mcg.  Mango - 1 medium has 9 mcg.  Nuts - 1 oz. has 15 mcg.  Pear - 1 medium has 8 mcg.  Peas (cooked) -  cup has 19 mcg.  Pickles - 1 spear has 14  mcg.  Pumpkin seeds - 1 oz. has 13 mcg.  Sauerkraut (canned) -  cup has 16 mcg.  Soybeans (cooked) -  cup has 16 mcg.  Tomato (raw) - 1 medium has 10 mcg.  Tomato sauce -  cup has 17 mcg. Vitamin K-free foods If a food contain less than 5 mcg per serving, it is considered to have no vitamin K. These foods include:  Bread and cereal products.  Cheese.  Eggs.  Fish and shellfish.  Meat and poultry.  Milk and dairy products.  Sunflower seeds. Actual amounts of vitamin K in foods may be different depending on processing. Talk with your dietitian about what foods you can eat and what foods you should avoid. This information is not intended to replace advice given to you by your health care provider. Make sure you discuss any questions you have with your health care provider. Document Released: 11/27/2008 Document Revised: 08/22/2015 Document Reviewed: 05/05/2015 Elsevier Interactive Patient Education  2017 Elsevier Inc. Mitral Valve Repair, Care After This sheet gives you information about how to care for yourself after your procedure. Your health care provider may also give you more specific instructions. If you have problems or questions, contact your health care provider. What can I expect after the procedure? After the procedure, it is common to have:  Pain at the incision area that may last for several weeks. Follow these instructions at home: Incision care   Follow instructions from your health care provider about how to take care of your incision. Make sure you:  Wash your hands with soap and water before you change your bandage (dressing). If soap and water are not available, use hand sanitizer.  Change your dressing as told by your health care provider.  Leave stitches (sutures), skin glue, or adhesive strips in place. These skin closures may need to stay in place for 2 weeks or longer. If adhesive strip edges start to loosen and curl up, you may trim the loose  edges. Do not remove adhesive strips completely unless your health care provider tells you to do that.  Check your incision area every day for signs of infection. Check for:  More redness, swelling, or pain.  More fluid or blood.  Warmth.  Pus or a bad smell.   Do not apply powder or lotion to the area. Driving   Do not drive until your health care provider approves.  Do  not drive or use heavy machinery while taking prescription pain medicines. Bathing   Do not take baths, swim, or use a hot tub for 2-4 weeks after surgery, or until your health care provider approves. Ask your health care provider if you may take showers.  To wash the incision site, gently wash with soap and water and pat the area dry with a clean towel. Do not rub the incision area. That may cause bleeding. Activity   Rest as told by your health care provider. Ask your health care provider when you can resume normal activities, including sexual activity.  Avoid the following activities for 6-8 weeks, or as long as directed:  Lifting anything that is heavier than 10 lb (4.5 kg), or the limit that your health care provider tells you.  Pushing or pulling things with your arms.  Avoid climbing stairs and using the handrail to pull yourself up for the first 2-3 weeks after surgery.  Avoid airplane travel for 4-6 weeks, or as long as directed.  Avoid sitting for long periods of time and crossing your legs. Get up and move around at least once every 1-2 hours.  If you are taking blood thinners (anticoagulants), avoid activities that have a high risk of injury. Ask your health care provider what activities are safe for you. Lifestyle   Limit alcohol intake to no more than 1 drink a day for nonpregnant women and 2 drinks a day for men. One drink equals 12 oz of beer, 5 oz of wine, or 1 oz of hard liquor.  Do not use any products that contain nicotine or tobacco, such as cigarettes and e-cigarettes. If you need  help quitting, ask your health care provider. General instructions   Take your temperature every day and weigh yourself every morning for the first 7 days after surgery. Write your temperatures and weight down and take this record with you to any follow-up visits.  Take over-the-counter and prescription medicines only as told by your health care provider.  To prevent or treat constipation while you are taking prescription pain medicine, your health care provider may recommend that you:  Drink enough fluid to keep your urine clear or pale yellow.  Take over-the-counter or prescription medicines.  Eat foods that are high in fiber, such as fresh fruits and vegetables, whole grains, and beans.  Limit foods that are high in fat and processed sugars, such as fried and sweet foods.  Follow instructions from your health care provider about eating or drinking restrictions.  Wear compression stockings for at least 2 weeks, or as long as told by your health care provider. These stockings help to prevent blood clots and reduce swelling in your legs. If your ankles are swollen after 2 weeks, continue to wear the stockings.  Keep all follow-up visits as told by your health care provider. This is important. Contact a health care provider if:  You develop a skin rash.  Your weight is increasing each day over 2-3 days.  You gain 2 lb (1 kg) or more in a single day.  You have a fever. Get help right away if:  You develop chest pain that feels different from the pain caused by your incision.  You develop shortness of breath or difficulty breathing.  You have more redness, swelling, or pain around your incision.  You have more fluid or blood coming from your incision.  Your incision feels warm to the touch.  You have pus or a bad smell coming  from your incision.  You feel light-headed. This information is not intended to replace advice given to you by your health care provider. Make sure  you discuss any questions you have with your health care provider. Document Released: 08/19/2004 Document Revised: 11/12/2015 Document Reviewed: 11/12/2015 Elsevier Interactive Patient Education  2017 [employer].

## 2016-06-26 LAB — CBC
HEMATOCRIT: 37.5 % — AB (ref 39.0–52.0)
Hemoglobin: 12.6 g/dL — ABNORMAL LOW (ref 13.0–17.0)
MCH: 31.3 pg (ref 26.0–34.0)
MCHC: 33.6 g/dL (ref 30.0–36.0)
MCV: 93.1 fL (ref 78.0–100.0)
PLATELETS: 112 10*3/uL — AB (ref 150–400)
RBC: 4.03 MIL/uL — AB (ref 4.22–5.81)
RDW: 15.1 % (ref 11.5–15.5)
WBC: 5.1 10*3/uL (ref 4.0–10.5)

## 2016-06-26 LAB — BASIC METABOLIC PANEL
Anion gap: 11 (ref 5–15)
BUN: 13 mg/dL (ref 6–20)
CHLORIDE: 98 mmol/L — AB (ref 101–111)
CO2: 28 mmol/L (ref 22–32)
CREATININE: 0.98 mg/dL (ref 0.61–1.24)
Calcium: 9 mg/dL (ref 8.9–10.3)
GFR calc Af Amer: >60 mL/min (ref >60)
GFR calc non Af Amer: >60 mL/min (ref >60)
Glucose, Bld: 183 mg/dL — ABNORMAL HIGH (ref 65–99)
POTASSIUM: 3.5 mmol/L (ref 3.5–5.1)
SODIUM: 137 mmol/L (ref 135–145)

## 2016-06-26 LAB — GLUCOSE, CAPILLARY
GLUCOSE-CAPILLARY: 136 mg/dL — AB (ref 65–99)
Glucose-Capillary: 107 mg/dL — ABNORMAL HIGH (ref 65–99)

## 2016-06-26 LAB — PROTIME-INR
INR: 1.56
PROTHROMBIN TIME: 18.8 s — AB (ref 11.4–15.2)

## 2016-06-26 LAB — MAGNESIUM: MAGNESIUM: 1.9 mg/dL (ref 1.7–2.4)

## 2016-06-26 MED ORDER — ACETAMINOPHEN 500 MG PO TABS: 1000.0000 mg | ORAL_TABLET | Freq: Four times a day (QID) | ORAL | 0 refills | Status: AC | PRN

## 2016-06-26 MED ORDER — MAGNESIUM OXIDE 400 (241.3 MG) MG PO TABS
400.0000 mg | ORAL_TABLET | Freq: Two times a day (BID) | ORAL | Status: DC
  Administered 2016-06-26: 400 mg via ORAL
  Filled 2016-06-26: qty 1

## 2016-06-26 MED ORDER — POTASSIUM CHLORIDE CRYS ER 20 MEQ PO TBCR
20.0000 meq | EXTENDED_RELEASE_TABLET | Freq: Once | ORAL | Status: DC
Start: 2016-06-26 — End: 2016-06-26

## 2016-06-26 MED ORDER — ATENOLOL 25 MG PO TABS
25.0000 mg | ORAL_TABLET | Freq: Every day | ORAL | Status: DC
  Administered 2016-06-26: 25 mg via ORAL
  Filled 2016-06-26: qty 1

## 2016-06-26 MED ORDER — TRAMADOL HCL 50 MG PO TABS: 50.0000 mg | ORAL_TABLET | ORAL | 0 refills | Status: DC | PRN

## 2016-06-26 MED ORDER — WARFARIN SODIUM 2.5 MG PO TABS: 2.5000 mg | ORAL_TABLET | Freq: Every day | ORAL | 3 refills | Status: DC

## 2016-06-26 MED ORDER — POTASSIUM CHLORIDE CRYS ER 20 MEQ PO TBCR
40.0000 meq | EXTENDED_RELEASE_TABLET | ORAL | Status: AC
  Administered 2016-06-26 (×2): 40 meq via ORAL
  Filled 2016-06-26: qty 2

## 2016-06-26 MED ORDER — MAGNESIUM OXIDE 400 (241.3 MG) MG PO TABS: 400.0000 mg | ORAL_TABLET | Freq: Two times a day (BID) | ORAL | 0 refills | Status: DC

## 2016-06-26 MED ORDER — POTASSIUM CHLORIDE CRYS ER 20 MEQ PO TBCR
40.0000 meq | EXTENDED_RELEASE_TABLET | Freq: Every day | ORAL | Status: DC
  Filled 2016-06-26: qty 2

## 2016-06-26 MED ORDER — POTASSIUM CHLORIDE CRYS ER 20 MEQ PO TBCR: 40.0000 meq | EXTENDED_RELEASE_TABLET | Freq: Every day | ORAL | 0 refills | Status: DC

## 2016-06-26 MED ORDER — ATENOLOL 25 MG PO TABS: 25.0000 mg | ORAL_TABLET | Freq: Every day | ORAL | 3 refills | Status: DC

## 2016-06-26 MED ORDER — FUROSEMIDE 40 MG PO TABS: 40.0000 mg | ORAL_TABLET | Freq: Every day | ORAL | 0 refills | Status: DC

## 2016-06-26 NOTE — Care Management Note (Signed)
Case Management Note Marvetta Gibbons RN, BSN Unit 2W-Case Manager [phone]  Patient Details  Name: David Carr. MRN: [id number] Date of Birth: 06/27/1945  Subjective/Objective:   Pt admitted s/p Mini MVR on 06/21/16                 Action/Plan: PTA pt lived at home with  Spouse- anticipate return home- pt tx from [facility] to 2W on 06/23/16 - CM to follow for d/c needs  Expected Discharge Date:  06/26/16               Expected Discharge Plan:  Home/Self Care  In-House Referral:     Discharge planning Services  CM Consult  Post Acute Care Choice:  NA Choice offered to:  NA  DME Arranged:    DME Agency:     HH Arranged:    [place] Agency:     Status of Service:  Completed, signed off  If discussed at [employer] of Stay Meetings, dates discussed:    Discharge Disposition: home/self care   Additional Comments:  06/26/16- 1500- Jayliah Benett RN, CM- pt for d/c home today- no CM needs noted for discharge  Dawayne Patricia, RN 06/26/2016, 3:16 PM

## 2016-06-26 NOTE — Progress Notes (Addendum)
      [address]       [place] [address]             [phone]      5 Days Post-Op Procedure(s) (LRB): MINIMALLY INVASIVE MITRAL VALVE REPAIR (MVR) USING SORIN MEMO 3D SIZE 32 SEMIRIGID ANNULOPLASTY RING (N/A) TRANSESOPHAGEAL ECHOCARDIOGRAM (TEE) (N/A) PATENT FORAMEN OVALE (PFO) CLOSURE (N/A)   Subjective:  No complaints.  Wants to go home.  +ambulation  Objective: Vital signs in last 24 hours: Temp:  [98.2 F (36.8 C)-98.5 F (36.9 C)] 98.5 F (36.9 C) (05/14 0404) Pulse Rate:  [75-85] 75 (05/14 0404) Cardiac Rhythm: Heart block;Normal sinus rhythm (05/13 1900) Resp:  [18] 18 (05/14 0404) BP: (96-109)/(52-69) 109/52 (05/14 0404) SpO2:  [98 %-99 %] 99 % (05/14 0404) Weight:  [187 lb 12.8 oz (85.2 kg)] 187 lb 12.8 oz (85.2 kg) (05/14 0404)   Intake/Output from previous day: 05/13 0701 - 05/14 0700 In: 600 [P.O.:600] Out: -   General appearance: alert, cooperative and no distress Heart: regular rate and rhythm Lungs: clear to auscultation bilaterally Abdomen: soft, non-tender; bowel sounds normal; no masses,  no organomegaly Extremities: edema trace Wound: clean and dry  Lab Results:  Recent Labs  06/24/16 0310  WBC 5.9  HGB 10.4*  HCT 31.3*  PLT 61*   BMET:  Recent Labs  06/24/16 0310  NA 138  K 3.5  CL 101  CO2 29  GLUCOSE 100*  BUN 8  CREATININE 0.85  CALCIUM 7.9*    PT/INR:  Recent Labs  06/25/16 0329  LABPROT 17.4*  INR 1.41   ABG    Component Value Date/Time   PHART 7.404 06/21/2016 2351   HCO3 25.4 06/21/2016 2351   TCO2 21 06/22/2016 1554   O2SAT 63.3 06/22/2016 0236   CBG (last 3)   Recent Labs  06/25/16 1648 06/25/16 2113 06/26/16 0646  GLUCAP 140* 94 107*    Assessment/Plan: S/P Procedure(s) (LRB): MINIMALLY INVASIVE MITRAL VALVE REPAIR (MVR) USING SORIN MEMO 3D SIZE 32 SEMIRIGID ANNULOPLASTY RING (N/A) TRANSESOPHAGEAL ECHOCARDIOGRAM (TEE) (N/A) PATENT FORAMEN OVALE (PFO) CLOSURE (N/A)  1.  CV- Ventricular Trigeminy- new onset, increase atenolol 2. Pulm- no acute issues, continue IS 3. Renal- creatinine has been WNL, weight is trending down, potassium was borderline low, on 5/12, will repeat BMET, supplement magnesium 4. DM- sugars controlled, continue Metformin, Levemir 5. INR 1.41, will repeat coumadin at 2.5 6. Dispo- patient stable, will repeat BMET, Mg level, continue coumadin, new Ventricular Trigeminy.. BB increased... Will defer d/c planning to Dr. Roxy Manns    LOS: 5 days    Ellwood Handler 06/26/2016   I have seen and examined the patient and agree with the assessment and plan as outlined.  Recheck labs.  Possible d/c home later today or tomorrow.  Rexene Alberts, MD 06/26/2016 8:43 AM

## 2016-06-26 NOTE — Progress Notes (Signed)
CARDIAC REHAB PHASE I   PRE:  Rate/Rhythm: 83 SR trigeminy PVCS  BP:  Supine:   Sitting: 103/61  Standing:    SaO2: 96%RA  MODE:  Ambulation: 500 ft   POST:  Rate/Rhythm: 95 SR trigeminy PVCS  BP:  Supine:   Sitting: 149/70  Standing:    SaO2: 96%RA 0910-0932 Pt walked 500 ft on RA with hand held asst. Tolerated well. To recliner after walk. No questions re ed done this weekend. Wants to go home.    Graylon Good, RN BSN  06/26/2016 9:29 AM

## 2016-06-27 ENCOUNTER — Telehealth (HOSPITAL_COMMUNITY): Payer: Self-pay

## 2016-06-27 ENCOUNTER — Ambulatory Visit (INDEPENDENT_AMBULATORY_CARE_PROVIDER_SITE_OTHER): Payer: 59 | Admitting: Pharmacist Clinician (PhC)/ Clinical Pharmacy Specialist

## 2016-06-27 DIAGNOSIS — Z7901 Long term (current) use of anticoagulants: Secondary | ICD-10-CM | POA: Diagnosis not present

## 2016-06-27 DIAGNOSIS — Z9889 Other specified postprocedural states: Secondary | ICD-10-CM | POA: Diagnosis not present

## 2016-06-27 LAB — POCT INR: INR: 1.7

## 2016-06-27 NOTE — Telephone Encounter (Signed)
Verified [employer] &  Medicare A only insurance benefits through [employer]. Pt has Medicare part A only for hospital stay. No Copay or Coinsurance   Deductible $1000.00, Out of Pocket $3000.00, pt has met both Deductible/OOP. Reference [phone] & [phone].... KJ

## 2016-06-29 ENCOUNTER — Ambulatory Visit: Payer: Self-pay | Admitting: Physician Assistant

## 2016-07-03 ENCOUNTER — Encounter: Payer: Self-pay | Admitting: Physician Assistant

## 2016-07-03 ENCOUNTER — Ambulatory Visit (INDEPENDENT_AMBULATORY_CARE_PROVIDER_SITE_OTHER): Payer: 59 | Admitting: Physician Assistant

## 2016-07-03 ENCOUNTER — Other Ambulatory Visit: Payer: Self-pay | Admitting: Physician Assistant

## 2016-07-03 ENCOUNTER — Ambulatory Visit (INDEPENDENT_AMBULATORY_CARE_PROVIDER_SITE_OTHER): Payer: 59 | Admitting: Pharmacist

## 2016-07-03 VITALS — BP 90/66 | HR 85 | Ht 68.5 in | Wt 184.0 lb

## 2016-07-03 DIAGNOSIS — R072 Precordial pain: Secondary | ICD-10-CM | POA: Diagnosis not present

## 2016-07-03 DIAGNOSIS — D696 Thrombocytopenia, unspecified: Secondary | ICD-10-CM | POA: Diagnosis not present

## 2016-07-03 DIAGNOSIS — I951 Orthostatic hypotension: Secondary | ICD-10-CM

## 2016-07-03 DIAGNOSIS — Z9889 Other specified postprocedural states: Secondary | ICD-10-CM

## 2016-07-03 DIAGNOSIS — Z7901 Long term (current) use of anticoagulants: Secondary | ICD-10-CM

## 2016-07-03 LAB — BASIC METABOLIC PANEL
BUN/Creatinine Ratio: 15 (ref 10–24)
BUN: 15 mg/dL (ref 8–27)
CALCIUM: 9.6 mg/dL (ref 8.6–10.2)
CO2: 27 mmol/L (ref 18–29)
Chloride: 98 mmol/L (ref 96–106)
Creatinine, Ser: 1.02 mg/dL (ref 0.76–1.27)
GFR calc Af Amer: 84 mL/min/{1.73_m2} (ref >59)
GFR, EST NON AFRICAN AMERICAN: 73 mL/min/{1.73_m2} (ref >59)
GLUCOSE: 137 mg/dL — AB (ref 65–99)
POTASSIUM: 5.6 mmol/L — AB (ref 3.5–5.2)
SODIUM: 139 mmol/L (ref 134–144)

## 2016-07-03 LAB — CBC WITH DIFFERENTIAL/PLATELET
BASOS: 0 %
Basophils Absolute: 0 10*3/uL (ref 0.0–0.2)
EOS (ABSOLUTE): 0.1 10*3/uL (ref 0.0–0.4)
EOS: 2 %
HEMOGLOBIN: 13.9 g/dL (ref 13.0–17.7)
Hematocrit: 41.2 % (ref 37.5–51.0)
IMMATURE GRANS (ABS): 0 10*3/uL (ref 0.0–0.1)
IMMATURE GRANULOCYTES: 0 %
LYMPHS: 15 %
Lymphocytes Absolute: 1.1 10*3/uL (ref 0.7–3.1)
MCH: 31.3 pg (ref 26.6–33.0)
MCHC: 33.7 g/dL (ref 31.5–35.7)
MCV: 93 fL (ref 79–97)
MONOS ABS: 0.4 10*3/uL (ref 0.1–0.9)
Monocytes: 6 %
NEUTROS PCT: 77 %
Neutrophils Absolute: 5.6 10*3/uL (ref 1.4–7.0)
Platelets: 223 10*3/uL (ref 150–379)
RBC: 4.44 x10E6/uL (ref 4.14–5.80)
RDW: 15 % (ref 12.3–15.4)
WBC: 7.2 10*3/uL (ref 3.4–10.8)

## 2016-07-03 LAB — MAGNESIUM: Magnesium: 2.4 mg/dL — ABNORMAL HIGH (ref 1.6–2.3)

## 2016-07-03 LAB — POCT INR: INR: 3.3

## 2016-07-03 MED ORDER — ATENOLOL 25 MG PO TABS: 12.5000 mg | ORAL_TABLET | Freq: Every day | ORAL | 3 refills | Status: DC

## 2016-07-03 NOTE — Patient Instructions (Signed)
Medication Instructions:  START ATENOLOL 12.5MG  DAILY (1/2 OF 25MG  TABLET)  STOP LASIX-USE AS NEEDED MAKE SURE TO TAKE POTASSIUM WHEN TAKING LASIX  STOP MAG-OX  If you need a refill on your cardiac medications before your next appointment, please call your pharmacy.  Labwork: BMET, MAG AND CBC TODAY HERE IN OUR OFFICE AT [facility]  Follow-Up: Your physician wants you to follow-up in: 3 MONTHS WITH DR [facility]   Special Instructions: BE SURE TO CALL Friday IF DIZZINESS IS NOT BETTER  Thank you for choosing [facility] at [facility]!!

## 2016-07-03 NOTE — Progress Notes (Signed)
Cardiology Office Note   Date:  07/03/2016   ID:  [name], DOB 08-Jun-1945, MRN [id number]  PCP:  Dorena Cookey, MD  Cardiologist:   Dr. Percival Spanish 02/01/2015  Rosaria Ferries, PA-C   Chief Complaint  Patient presents with  . Follow-up    having some lightheadedness, having some chest tightness    History of Present Illness: David Carr. is a 71 y.o. male with a history of severe MR, AML, DM, HTN, HLD   DC 06/26/2016 after Minimally Invasive Mitral Valve Repair with valvuloplasty, ring annuloplasty, and placement of Neo Chord. He had thrombocytopenia with a platelet count of 61,000. He is on Coumadin. His INR was 1.7 on 05/15  [name] presents for follow up after surgery.  Since he left the hospital, his incision pain has improved. He still has some ecchymosis and swelling in the area of the incisions  He has had chest tightness and SOB. This is with exertion. The chest pain resolves with rest. He has pushed himself at times to the point that he was weak and light-headed. If he does not push too hard, he will do ok. He had a syncopal episode that started with orthostatic weakness. It was brief and he has watched himself more closely since then. He has not felt like he was going to lose consciousness since then. He does not do as much and is careful to sit down if he starts to get dizzy.  He was not on Lasix before his surgery. He was not on potassium or magnesium before the surgery. He has not had lower extremity edema and denies orthopnea or PND.   Past Medical History:  Diagnosis Date  . AML (acute myeloid leukemia) in remission [facility]) oncologist-  dr Alvy Bimler-- per last note 10/ 2017 in remission 2 years   dx 09-11-2013  via bone marrow bx , FLT3 negative (NP M1 +)/  chemotherapy started 09-17-2013,  remission via marrow bx 11-06-2013,  4 cycles consolidation chemo w/ HiDAC 11-13-2013 to 03-05-2014  . BPH with urinary obstruction   .  Chronic thrombocytopenic purpura ([place])   . Dental caries    periodontitis  . Dyspnea   . GERD (gastroesophageal reflux disease)   . Heart murmur   . History of kidney stones   . Hyperlipidemia   . Hypertension   . MVP (mitral valve prolapse)   . Pancytopenia, acquired ([place])   . Right ureteral stone   . 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   Complex valvuloplasty including triangular resection of flail segment of posterior leaflet, artificial Gore-tex neochord placement x6 and 59m Sorin Memo 3D ring annuloplasty via right mini thoracotomy approach  . Severe mitral regurgitation   . Type 2 diabetes mellitus with hypoglycemia, with long-term current use of insulin ([facility]    endocrinologist-  dr gCon Memos . Wears dentures    upper    Past Surgical History:  Procedure Laterality Date  . CARDIAC CATHETERIZATION    . CARDIOVASCULAR STRESS TEST  07/23/2013   Low risk nuclear study w/ mild inferior ischemia/  normal LV function and wall motion, ef 69%  . CYSTOSCOPY WITH RETROGRADE PYELOGRAM, URETEROSCOPY AND STENT PLACEMENT Right 04/20/2016   Procedure: CYSTOSCOPY WITH RETROGRADE PYELOGRAM, URETEROSCOPY , STONE BASKETRY AND STENT PLACEMENT;  Surgeon: SFranchot Gallo MD;  Location: [facility]  Service: Urology;  Laterality: Right;  . EXTRACORPOREAL SHOCK WAVE LITHOTRIPSY  yrs ago  . HOLMIUM LASER APPLICATION  Right 04/20/2016   Procedure: HOLMIUM LASER APPLICATION;  Surgeon: Franchot Gallo, MD;  Location: [facility];  Service: Urology;  Laterality: Right;  . INGUINAL HERNIA REPAIR Left 1990's  . MITRAL VALVE REPAIR N/A 06/21/2016   Procedure: MINIMALLY INVASIVE MITRAL VALVE REPAIR (MVR) USING SORIN MEMO 3D SIZE 32 SEMIRIGID ANNULOPLASTY RING;  Surgeon: Rexene Alberts, MD;  Location: [place];  Service: Open Heart Surgery;  Laterality: N/A;  . MULTIPLE EXTRACTIONS WITH ALVEOLOPLASTY N/A 06/08/2016   Procedure: MULTIPLE EXTRACTION WITH ALVEOLOPLASTY  AND PRE-PROSTHETIC SURGERY AS NEEDED;  Surgeon: Lenn Cal, DDS;  Location: [place];  Service: Oral Surgery;  Laterality: N/A;  . PATENT FORAMEN OVALE(PFO) CLOSURE N/A 06/21/2016   Procedure: PATENT FORAMEN OVALE (PFO) CLOSURE;  Surgeon: Rexene Alberts, MD;  Location: [place];  Service: Open Heart Surgery;  Laterality: N/A;  . RIGHT/LEFT HEART CATH AND CORONARY ANGIOGRAPHY N/A 06/07/2016   Procedure: Right/Left Heart Cath and Coronary Angiography;  Surgeon: Sherren Mocha, MD;  Location: [place] CV LAB;  Service: Cardiovascular;  Laterality: N/A;  . ROTATOR CUFF REPAIR Right x2  last one 2011  . TEE WITHOUT CARDIOVERSION N/A 01/11/2016   Procedure: TRANSESOPHAGEAL ECHOCARDIOGRAM (TEE);  Surgeon: Pixie Casino, MD;  Location: [facility];  Service: Cardiovascular;  Laterality: N/A; Severe MR associated w/ flail P2 segment of MV;  no LAA thromus;  small PFO by saline microbubble contrast after valsalva; LVEF 55-60%  . TEE WITHOUT CARDIOVERSION N/A 06/21/2016   Procedure: TRANSESOPHAGEAL ECHOCARDIOGRAM (TEE);  Surgeon: Rexene Alberts, MD;  Location: [place];  Service: Open Heart Surgery;  Laterality: N/A;  . TENNIS ELBOW RELEASE/NIRSCHEL PROCEDURE Right 1990's  . TRANSTHORACIC ECHOCARDIOGRAM  12/22/2015   grade 1 diastolic dysfunction,  ef 55-60%/  moderate MVP involving posterior leaflet w/ partial flail and severe MR via CW doppler (peak gradient 38mHg)/  trivial TR    Current Outpatient Prescriptions  Medication Sig Dispense Refill  . acetaminophen (TYLENOL) 500 MG tablet Take 2 tablets (1,000 mg total) by mouth every 6 (six) hours as needed. 30 tablet 0  . aspirin 81 MG tablet Take 81 mg by mouth daily with supper.     .Marland Kitchenatenolol (TENORMIN) 25 MG tablet Take 1 tablet (25 mg total) by mouth daily. 30 tablet 3  . chlorhexidine (PERIDEX) 0.12 % solution Rinse with 15 mls twice daily for 30 seconds. Use after breakfast and at bedtime. Spit out excess. Do not swallow. 480 mL prn  . furosemide  (LASIX) 40 MG tablet Take 1 tablet (40 mg total) by mouth daily. 30 tablet 0  . Insulin Glargine (BASAGLAR KWIKPEN) 100 UNIT/ML SOPN INJECT 30 UNITS INTO THE SKIN AT BEDTIME. 30 mL 0  . magnesium oxide (MAG-OX) 400 (241.3 Mg) MG tablet Take 1 tablet (400 mg total) by mouth 2 (two) times daily. 14 tablet 0  . metFORMIN (GLUCOPHAGE-XR) 500 MG 24 hr tablet Take 1 tablet (500 mg total) by mouth daily with supper. (Patient taking differently: Take 1,000 mg by mouth daily with supper. ) 60 tablet 3  . potassium chloride SA (K-DUR,KLOR-CON) 20 MEQ tablet Take 2 tablets (40 mEq total) by mouth daily. 60 tablet 0  . simvastatin (ZOCOR) 20 MG tablet Take 1 tablet (20 mg total) by mouth at bedtime. 90 tablet 3  . traMADol (ULTRAM) 50 MG tablet Take 1 tablet (50 mg total) by mouth every 4 (four) hours as needed for moderate pain. 30 tablet 0  . warfarin (COUMADIN) 2.5 MG tablet Take  1 tablet (2.5 mg total) by mouth daily at 6 PM. 30 tablet 3   No current facility-administered medications for this visit.    Facility-Administered Medications Ordered in Other Visits  Medication Dose Route Frequency Provider Last Rate Last Dose  . heparin lock flush 100 unit/mL  500 Units Intravenous Once Brunetta Genera, MD      . sodium chloride 0.9 % injection 10 mL  10 mL Intravenous PRN Brunetta Genera, MD        Allergies:   Prochlorperazine and Morphine and related    Social History:  The patient  reports that he quit smoking about 42 years ago. His smoking use included Cigarettes. He quit after 20.00 years of use. He has never used smokeless tobacco. He reports that he does not drink alcohol or use drugs.   Family History:  The patient's family history includes Asthma in his sister; Cancer in his daughter.    ROS:  Please see the history of present illness. All other systems are reviewed and negative.    PHYSICAL EXAM: VS:  BP 90/66 (BP Location: Right Arm, Patient Position: Sitting, Cuff Size: Normal)    Pulse 85   Ht 5' 8.5" (1.74 m)   Wt 184 lb (83.5 kg)   BMI 27.57 kg/m  , BMI Body mass index is 27.57 kg/m. GEN: Well nourished, well developed, male in no acute distress  HEENT: normal for age  Neck: no JVD, no carotid bruit, no masses Cardiac: RRR; no murmur, no rubs, or gallops Respiratory:  clear to auscultation bilaterally, normal work of breathing GI: soft, nontender, nondistended, + BS MS: no deformity or atrophy; no edema; distal pulses are 2+ in all 4 extremities   Skin: warm and dry, no rash; all incisions are healing well with no signs of infection Neuro:  Strength and sensation are intact Psych: euthymic mood, full affect  Orthostatic vital signs  lying  lying sitting standing  BP-HR       right 90/66-85 100/66-81 101/66- 83 87/59 - 4  BP       left 82/66        EKG:  EKG is ordered today. The ekg ordered today demonstrates sinus rhythm, heart rate 85 left atrial enlargement noted, inferior Q waves   TEE: 01/11/2016 Findings: 1. LEFT VENTRICLE: The left ventricular wall thickness is mildly increased.  The left ventricular cavity is normal in size. Wall motion is normal.  LVEF is 55-60%. 2. RIGHT VENTRICLE:  The right ventricle is normal in structure and function without any thrombus or masses.   3. LEFT ATRIUM:  The left atrium is dilated in size without any thrombus or masses.  There is not spontaneous echo contrast ("smoke") in the left atrium consistent with a low flow state. 4. LEFT ATRIAL APPENDAGE:  The left atrial appendage is free of any thrombus or masses. The appendage has single lobes. Pulse doppler indicates moderate flow in the appendage. 5. ATRIAL SEPTUM:  The atrial septum appears intact and is free of thrombus and/or masses.  There is no evidence for interatrial shunting by color doppler and saline microbubble. 6. RIGHT ATRIUM:  The right atrium is normal in size and function without any thrombus or masses. 7. MITRAL VALVE:  The mitral valve  demonstrates mid to late systolic prolapse of the P2 segment of the posterior leaflet with a flail cord noted to prolapse past the valve plane in to the left atrium. There is Severe regurgitation. Reversal of flow in  the LUPV is noted. RVol on PISA is 80 ml with ERO of 0.5 cm2. There were no vegetations or stenosis. 8. AORTIC VALVE:  The aortic valve is trileaflet, normal in structure and function with no regurgitation.  There were no vegetations or stenosis 9. TRICUSPID VALVE:  The tricuspid valve is normal in structure and function with trivial regurgitation.  There were no vegetations or stenosis 10.  PULMONIC VALVE:  The pulmonic valve is normal in structure and function with trivial regurgitation.  There were no vegetations or stenosis.  11. AORTIC ARCH, ASCENDING AND DESCENDING AORTA:  There was no Ron Parker et. Al, 1992) atherosclerosis of the ascending aorta, aortic arch, or proximal descending aorta. 12. PULMONARY VEINS: Anomalous pulmonary venous return was not noted. Systolic flow reversal was noted in the LUPV. 13. PERICARDIUM: The pericardium appeared normal and non-thickened.  There is no pericardial effusion. IMPRESSION: 1. Severe mitral regurgitation associated with flail P2 segment of the mitral valve. 2. No LAA thrombus 3. Small PFO by saline microbubble contrast after valsalva 4. LVEF 55-60% RECOMMENDATIONS:   1. Despite the findings, the patient reports no worsening dyspnea or fatigue over the past year. He says he has had mitral regurgitation for 15 years, however, the findings of a flail leaflet are new and his MR is now severe (it had previously been moderate).  Recent Labs: 01/03/2016: TSH 2.51 06/19/2016: ALT 17 06/26/2016: BUN 13; Creatinine, Ser 0.98; Hemoglobin 12.6; Magnesium 1.9; Platelets 112; Potassium 3.5; Sodium 137    Lipid Panel    Component Value Date/Time   CHOL 130 10/21/2015 0759   TRIG 74.0 10/21/2015 0759   HDL 44.70 10/21/2015 0759   CHOLHDL 3  10/21/2015 0759   VLDL 14.8 10/21/2015 0759   LDLCALC 71 10/21/2015 0759   LDLDIRECT 74.4 06/19/2006 0956     Wt Readings from Last 3 Encounters:  07/03/16 184 lb (83.5 kg)  06/26/16 187 lb 12.8 oz (85.2 kg)  06/19/16 195 lb (88.5 kg)     Other studies Reviewed: Additional studies/ records that were reviewed today include: office notes, hospital records and testing.  ASSESSMENT AND PLAN:  1.  Orthostatic hypotension: The patient states he was not on Lasix prior to his hospitalization and his atenolol dose was 12.5 mg daily. We will discontinue the Lasix and go back on previous atenolol dose. His orthostatic vital signs were positive and he drank 2 glasses of water while in the office. He felt better after this. He is to contact us later this week if his orthostatic dizziness does not resolve.  2. S/p MV repair: He is recovering well from the surgery. He still has some swelling and ecchymosis. There is no sign of infection. His INR is elevated today and that may be contributing to the ecchymosis. It is okay for him to use an ice pack on the area of swelling if that helps it feel better. Follow-up with the surgical team as scheduled. He has no murmur.  3. Chest pain and dyspnea on exertion: He had a heart catheterization prior to surgery that showed no significant CAD. I explained to him that the chest pain and dyspnea on exertion are likely the result of the surgery. He is encouraged to continue to increase his activity slowly which will be easier if he is no longer hypotensive. He is in agreement with this is the plan and is to contact us if he does not get any better.  4. Anticoagulation with Coumadin: His INR was checked and was  elevated.this is being managed by the Coumadin clinic  5. Thrombocytopenia: His platelets were low during this hospitalization. We will recheck those today   Current medicines are reviewed at length with the patient today.  The patient has concerns regarding  medicines. Concerns were addressed  The following changes have been made:  Decrease the atenolol and discontinue the Lasix except for when necessary use. He is to hold the potassium and magnesium now as well  Labs/ tests ordered today include:   Orders Placed This Encounter  Procedures  . Magnesium  . Basic metabolic panel  . CBC with Differential  . EKG 12-Lead     Disposition:   FU with Dr. Percival Spanish  Signed, Rosaria Ferries, PA-C  07/03/2016 2:04 PM    [place] Phone: [phone]; Fax: [phone]  This note was written with the assistance of speech recognition software. Please excuse any transcriptional errors.

## 2016-07-05 ENCOUNTER — Telehealth: Payer: Self-pay | Admitting: *Deleted

## 2016-07-05 ENCOUNTER — Telehealth (HOSPITAL_COMMUNITY): Payer: Self-pay

## 2016-07-05 NOTE — Telephone Encounter (Signed)
I called and spoke to patient about scheduling for orientation and cardiac rehab program. Patient stated he would like to wait to schedule at this time. Patient is still in pain and feels that he is just not ready at this time. Patient given my contact information when ready to schedule. Patient verbalized understanding.

## 2016-07-05 NOTE — Telephone Encounter (Signed)
Left message to call back  

## 2016-07-05 NOTE — Telephone Encounter (Signed)
-----   Message from Lonn Georgia, PA-C sent at 07/04/2016  8:57 AM EDT ----- Pt of Dr Percival Spanish Please let him know his platelets are now normal. K+ and Mg are a little high, continue to hold the [place] and MagOx  Thanks

## 2016-07-05 NOTE — Telephone Encounter (Signed)
Advised patient of lab results  

## 2016-07-11 ENCOUNTER — Ambulatory Visit (INDEPENDENT_AMBULATORY_CARE_PROVIDER_SITE_OTHER): Payer: 59 | Admitting: Pharmacist Clinician (PhC)/ Clinical Pharmacy Specialist

## 2016-07-11 DIAGNOSIS — Z9889 Other specified postprocedural states: Secondary | ICD-10-CM | POA: Diagnosis not present

## 2016-07-11 DIAGNOSIS — Z7901 Long term (current) use of anticoagulants: Secondary | ICD-10-CM

## 2016-07-11 LAB — POCT INR: INR: 1.2

## 2016-07-14 ENCOUNTER — Other Ambulatory Visit: Payer: Self-pay | Admitting: Thoracic Surgery (Cardiothoracic Vascular Surgery)

## 2016-07-14 DIAGNOSIS — K0820 Unspecified atrophy of edentulous alveolar ridge: Secondary | ICD-10-CM

## 2016-07-14 DIAGNOSIS — Z9889 Other specified postprocedural states: Secondary | ICD-10-CM

## 2016-07-14 DIAGNOSIS — K08109 Complete loss of teeth, unspecified cause, unspecified class: Secondary | ICD-10-CM

## 2016-07-14 DIAGNOSIS — M898X9 Other specified disorders of bone, unspecified site: Secondary | ICD-10-CM

## 2016-07-14 NOTE — Progress Notes (Unsigned)
cxr 

## 2016-07-16 ENCOUNTER — Emergency Department (HOSPITAL_COMMUNITY): Payer: 59

## 2016-07-16 ENCOUNTER — Encounter (HOSPITAL_COMMUNITY): Payer: Self-pay | Admitting: Emergency Medicine

## 2016-07-16 ENCOUNTER — Emergency Department (HOSPITAL_COMMUNITY)
Admission: EM | Admit: 2016-07-16 | Discharge: 2016-07-16 | Disposition: A | Discharge status: Home / Self Care | Payer: 59 | Attending: Emergency Medicine | Admitting: Emergency Medicine

## 2016-07-16 ENCOUNTER — Telehealth: Payer: Self-pay | Admitting: Emergency Medicine

## 2016-07-16 DIAGNOSIS — Z7901 Long term (current) use of anticoagulants: Secondary | ICD-10-CM | POA: Insufficient documentation

## 2016-07-16 DIAGNOSIS — Z955 Presence of coronary angioplasty implant and graft: Secondary | ICD-10-CM | POA: Insufficient documentation

## 2016-07-16 DIAGNOSIS — Z79899 Other long term (current) drug therapy: Secondary | ICD-10-CM | POA: Insufficient documentation

## 2016-07-16 DIAGNOSIS — Z794 Long term (current) use of insulin: Secondary | ICD-10-CM | POA: Insufficient documentation

## 2016-07-16 DIAGNOSIS — R55 Syncope and collapse: Secondary | ICD-10-CM | POA: Insufficient documentation

## 2016-07-16 DIAGNOSIS — Z7982 Long term (current) use of aspirin: Secondary | ICD-10-CM | POA: Diagnosis not present

## 2016-07-16 DIAGNOSIS — E11649 Type 2 diabetes mellitus with hypoglycemia without coma: Secondary | ICD-10-CM | POA: Diagnosis not present

## 2016-07-16 DIAGNOSIS — Z87891 Personal history of nicotine dependence: Secondary | ICD-10-CM | POA: Insufficient documentation

## 2016-07-16 DIAGNOSIS — I10 Essential (primary) hypertension: Secondary | ICD-10-CM | POA: Diagnosis not present

## 2016-07-16 LAB — I-STAT TROPONIN, ED
TROPONIN I, POC: 0.01 ng/mL (ref 0.00–0.08)
TROPONIN I, POC: 0.03 ng/mL (ref 0.00–0.08)

## 2016-07-16 LAB — CBC
HCT: 39.3 % (ref 39.0–52.0)
HEMOGLOBIN: 13.2 g/dL (ref 13.0–17.0)
MCH: 31.4 pg (ref 26.0–34.0)
MCHC: 33.6 g/dL (ref 30.0–36.0)
MCV: 93.3 fL (ref 78.0–100.0)
PLATELETS: 154 10*3/uL (ref 150–400)
RBC: 4.21 MIL/uL — AB (ref 4.22–5.81)
RDW: 14.7 % (ref 11.5–15.5)
WBC: 5.4 10*3/uL (ref 4.0–10.5)

## 2016-07-16 LAB — BASIC METABOLIC PANEL
ANION GAP: 7 (ref 5–15)
BUN: 8 mg/dL (ref 6–20)
CALCIUM: 9.2 mg/dL (ref 8.9–10.3)
CO2: 27 mmol/L (ref 22–32)
CREATININE: 0.92 mg/dL (ref 0.61–1.24)
Chloride: 106 mmol/L (ref 101–111)
GFR calc non Af Amer: >60 mL/min (ref >60)
Glucose, Bld: 193 mg/dL — ABNORMAL HIGH (ref 65–99)
Potassium: 4.4 mmol/L (ref 3.5–5.1)
SODIUM: 140 mmol/L (ref 135–145)

## 2016-07-16 LAB — PROTIME-INR
INR: 1.39
PROTHROMBIN TIME: 17.2 s — AB (ref 11.4–15.2)

## 2016-07-16 MED ORDER — SODIUM CHLORIDE 0.9 % IV BOLUS (SEPSIS)
1000.0000 mL | Freq: Once | INTRAVENOUS | Status: AC
  Administered 2016-07-16: 1000 mL via INTRAVENOUS

## 2016-07-16 MED ORDER — IOPAMIDOL (ISOVUE-370) INJECTION 76%
INTRAVENOUS | Status: AC
  Administered 2016-07-16: 100 mL via INTRAVENOUS
  Filled 2016-07-16: qty 100

## 2016-07-16 NOTE — Discharge Instructions (Signed)
You were seen for loss of consciousness.  You had a CT scan of your head and chest.  Your head CT was negative for acute bleed.  Your CT chest revealed no blood clot in your lungs but did show some small opacities that will require follow up in about 4 months.  Repeat imaging can be ordered by your primary care doctor.  Therefore, it is important that you schedule a follow up with them in the next week or so.    It was recommended that you be admitted for further work up of your syncope.  You declined this.  Risks and benefits were reviewed with you.  You accepted these risks.  Please make sure to see Dr Roxy Manns tomorrow as scheduled.

## 2016-07-16 NOTE — ED Notes (Signed)
Pt ambulated with standby assist without difficulty. Pt had normal gait and denied any lightheadedness, weakness, dizziness.

## 2016-07-16 NOTE — ED Notes (Signed)
Patient transported to CT 

## 2016-07-16 NOTE — ED Notes (Signed)
Pt out of room.

## 2016-07-16 NOTE — Telephone Encounter (Signed)
    Patient called the answering service around 3pm today  for medical advise.  He reports mitral valve repair done in early of May 2018 by Dr. Roxy Manns and has since been doing very well. Today he because very lightheaded with chest tightness. He felt a better and then shortly after had a syncopal vs near syncope episode. He has an appointment with Dr. Roxy Manns tomorrow but he is still feeling near syncopal with chest pain. Advised to go to [facility] ER to be evaluated by emergency team. If cardiology needed please contact us.

## 2016-07-16 NOTE — ED Notes (Signed)
Reports intermittent feelings of lightheadedness. Pt reports scheduled to see surgeon tomorrow for follow up.

## 2016-07-16 NOTE — ED Notes (Signed)
Pt understood dc material. NAD noted. 

## 2016-07-16 NOTE — ED Provider Notes (Signed)
[facility] DEPT Provider Note   CSN: [id number] Arrival date & time: 07/16/16  1527     History   Chief Complaint Chief Complaint  Patient presents with  . Loss of Consciousness    HPI David Carr. is a 71 y.o. male.  Patient seen on 5/21 by his Cardiologist for follow up on a Mitral valve repair performed 06/21/16.  At that visit he had noted a LOC at home.  He had an INR check on 5/29, which showed an INR 1.2 (goal 2-3).  He contacted the cardiologist on call today after he had a syncopal vs presyncopal episode at home that was associated with chest pain.  He was instructed to come to the ED for evaluation.  Patient notes that he was in his usual state of health this morning.  He reports he went to get coffee this morning when he developed substernal chest tightness.  He reports that this resolved.  He later entertained company and as he was seeing them out he reports that the "lights seemed to dim" and he attempted to stabilize himself on the wall but notes that the next thing he knew he was on the floor.  He reports he fell onto his right knee and hit the left side of his face on the floor.  Additionally, LOC after Mitral valve repair discussed at the  5/21 OV with cardiology was thought to be secondary to medication induced hypotension.  He reports that he is on only 1 BP medication now.  He reports taking his medications this morning.  He reports good PO intake and good exercise tolerance, walking about 3/4 mile daily.  He denies SOB, nausea, vomiting, diaphoresis.  Reports sensation of rapid heart rate occasionally.  Notes chest irritation on the right side where a suture was removed.    He notes that he has follow up with his cardiac surgeon tomorrow.      Past Medical History:  Diagnosis Date  . AML (acute myeloid leukemia) in remission [facility]) oncologist-  dr Alvy Bimler-- per last note 10/ 2017 in remission 2 years   dx 09-11-2013  via bone marrow bx , FLT3 negative (NP  M1 +)/  chemotherapy started 09-17-2013,  remission via marrow bx 11-06-2013,  4 cycles consolidation chemo w/ HiDAC 11-13-2013 to 03-05-2014  . BPH with urinary obstruction   . Chronic thrombocytopenic purpura ([place])   . Dental caries    periodontitis  . Dyspnea   . GERD (gastroesophageal reflux disease)   . Heart murmur   . History of kidney stones   . Hyperlipidemia   . Hypertension   . MVP (mitral valve prolapse)   . Pancytopenia, acquired ([place])   . Right ureteral stone   . 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 Severe mitral regurgitation   . Type 2 diabetes mellitus with hypoglycemia, with long-term current use of insulin ([facility]    endocrinologist-  dr gCon Memos . Wears dentures    upper    Patient Active Problem List   Diagnosis Date Noted  . Long term (current) use of anticoagulants [Z79.01] 06/27/2016  . 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  . Non-rheumatic mitral regurgitation   . Zoster 12/02/2015  . Obesity (BMI 30.0-34.9) 09/03/2015  . P[place]topenia, [place] 05/28/2015  . Nasal drainage  02/12/2014  . Leukopenia due to antineoplastic chemotherapy (HCC) 01/30/2014  . Other fatigue 01/19/2014  . Chills (without fever) 01/19/2014  . AML (acute myeloid leukemia) in remission (HCC) 11/14/2013  . Thrombocytopenia (HCC) 09/04/2013  . Anemia in neoplastic disease 09/04/2013  . Anemia, unspecified 08/28/2013  . Chest pain 07/10/2013  . Dysplastic nevus of face 08/09/2010  . Overweight 01/19/2009  . VERTIGO, POSITIONAL 03/18/2008  . MITRAL REGURGITATION 07/16/2007  . Type 2 diabetes mellitus with hypoglycemia without coma, with long-term current use of insulin (HCC) 05/31/2007  . Hyperlipidemia 05/31/2007  . Essential  hypertension 05/31/2007  . ALLERGIC RHINITIS 05/31/2007  . SEBORRHEIC KERATOSIS 05/31/2007  . SHOULDER PAIN, RIGHT 05/31/2007    Past Surgical History:  Procedure Laterality Date  . CARDIAC CATHETERIZATION    . CARDIOVASCULAR STRESS TEST  07/23/2013   Low risk nuclear study w/ mild inferior ischemia/  normal LV function and wall motion, ef 69%  . CYSTOSCOPY WITH RETROGRADE PYELOGRAM, URETEROSCOPY AND STENT PLACEMENT Right 04/20/2016   Procedure: CYSTOSCOPY WITH RETROGRADE PYELOGRAM, URETEROSCOPY , STONE BASKETRY AND STENT PLACEMENT;  Surgeon: Marcine Matar, MD;  Location: [facility];  Service: Urology;  Laterality: Right;  . EXTRACORPOREAL SHOCK WAVE LITHOTRIPSY  yrs ago  . HOLMIUM LASER APPLICATION Right 04/20/2016   Procedure: HOLMIUM LASER APPLICATION;  Surgeon: Marcine Matar, MD;  Location: [facility];  Service: Urology;  Laterality: Right;  . INGUINAL HERNIA REPAIR Left 1990's  . MITRAL VALVE REPAIR N/A 06/21/2016   Procedure: MINIMALLY INVASIVE MITRAL VALVE REPAIR (MVR) USING SORIN MEMO 3D SIZE 32 SEMIRIGID ANNULOPLASTY RING;  Surgeon: Purcell Nails, MD;  Location: [facility] OR;  Service: Open Heart Surgery;  Laterality: N/A;  . MULTIPLE EXTRACTIONS WITH ALVEOLOPLASTY N/A 06/08/2016   Procedure: MULTIPLE EXTRACTION WITH ALVEOLOPLASTY AND PRE-PROSTHETIC SURGERY AS NEEDED;  Surgeon: Charlynne Pander, DDS;  Location: [facility] OR;  Service: Oral Surgery;  Laterality: N/A;  . PATENT FORAMEN OVALE(PFO) CLOSURE N/A 06/21/2016   Procedure: PATENT FORAMEN OVALE (PFO) CLOSURE;  Surgeon: Purcell Nails, MD;  Location: [facility] OR;  Service: Open Heart Surgery;  Laterality: N/A;  . RIGHT/LEFT HEART CATH AND CORONARY ANGIOGRAPHY N/A 06/07/2016   Procedure: Right/Left Heart Cath and Coronary Angiography;  Surgeon: Tonny Bollman, MD;  Location: [facility] INVASIVE CV LAB;  Service: Cardiovascular;  Laterality: N/A;  . ROTATOR CUFF REPAIR Right x2  last one 2011  . TEE WITHOUT CARDIOVERSION  N/A 01/11/2016   Procedure: TRANSESOPHAGEAL ECHOCARDIOGRAM (TEE);  Surgeon: Chrystie Nose, MD;  Location: [facility] ENDOSCOPY;  Service: Cardiovascular;  Laterality: N/A; Severe MR associated w/ flail P2 segment of MV;  no LAA thromus;  small PFO by saline microbubble contrast after valsalva; LVEF 55-60%  . TEE WITHOUT CARDIOVERSION N/A 06/21/2016   Procedure: TRANSESOPHAGEAL ECHOCARDIOGRAM (TEE);  Surgeon: Purcell Nails, MD;  Location: [facility] OR;  Service: Open Heart Surgery;  Laterality: N/A;  . TENNIS ELBOW RELEASE/NIRSCHEL PROCEDURE Right 1990's  . TRANSTHORACIC ECHOCARDIOGRAM  12/22/2015   grade 1 diastolic dysfunction,  ef 55-60%/  moderate MVP involving posterior leaflet w/ partial flail and severe MR via CW doppler (peak gradient 65mmHg)/  trivial TR       Home Medications    Prior to Admission medications   Medication Sig Start Date End Date Taking? Authorizing Provider  aspirin 81 MG tablet Take 81 mg by mouth daily with supper.    Yes [provider]  atenolol (TENORMIN) 25 MG tablet Take 0.5 tablets (12.5 mg total) by mouth daily.  07/03/16  Yes Barrett, Evelene Croon, PA-C  traMADol (ULTRAM) 50 MG tablet Take 1 tablet (50 mg total) by mouth every 4 (four) hours as needed for moderate pain. 06/26/16  Yes Barrett, Erin R, PA-C  acetaminophen (TYLENOL) 500 MG tablet Take 2 tablets (1,000 mg total) by mouth every 6 (six) hours as needed. 06/26/16   Barrett, Erin R, PA-C  chlorhexidine (PERIDEX) 0.12 % solution Rinse with 15 mls twice daily for 30 seconds. Use after breakfast and at bedtime. Spit out excess. Do not swallow. 06/19/16 12/19/16  Lenn Cal, DDS  furosemide (LASIX) 40 MG tablet Take 1 tablet (40 mg total) by mouth daily. Patient not taking: Reported on 07/16/2016 06/27/16   Barrett, Lodema Hong, PA-C  Insulin Glargine (BASAGLAR KWIKPEN) 100 UNIT/ML SOPN INJECT 30 UNITS INTO THE SKIN AT BEDTIME. 05/30/16   Philemon Kingdom, MD  magnesium oxide (MAG-OX) 400 (241.3 Mg) MG tablet Take 1  tablet (400 mg total) by mouth 2 (two) times daily. Patient not taking: Reported on 07/16/2016 06/26/16   Barrett, Lodema Hong, PA-C  metFORMIN (GLUCOPHAGE-XR) 500 MG 24 hr tablet Take 1 tablet (500 mg total) by mouth daily with supper. Patient taking differently: Take 1,000 mg by mouth daily with supper.  05/09/16   Philemon Kingdom, MD  potassium chloride SA (K-DUR,KLOR-CON) 20 MEQ tablet Take 2 tablets (40 mEq total) by mouth daily. Patient not taking: Reported on 07/16/2016 06/27/16   Barrett, Lodema Hong, PA-C  simvastatin (ZOCOR) 20 MG tablet Take 1 tablet (20 mg total) by mouth at bedtime. 10/26/15   Dorena Cookey, MD  warfarin (COUMADIN) 2.5 MG tablet Take 1 tablet (2.5 mg total) by mouth daily at 6 PM. 06/26/16   Barrett, Lodema Hong, PA-C    Family History Family History  Problem Relation Age of Onset  . Asthma Sister   . Cancer Daughter        carcinoid tumor    Social History Social History  Substance Use Topics  . Smoking status: Former Smoker    Years: 20.00    Types: Cigarettes    Quit date: 04/18/1974  . Smokeless tobacco: Never Used  . Alcohol use No     Allergies   Prochlorperazine and Morphine and related   Review of Systems Review of Systems  Constitutional: Negative for activity change, appetite change, diaphoresis and fatigue.  Eyes: Negative for visual disturbance.  Respiratory: Positive for chest tightness (resolved). Negative for shortness of breath.   Cardiovascular: Negative for chest pain and leg swelling.  Gastrointestinal: Negative for abdominal pain, diarrhea and vomiting.  Genitourinary: Negative for difficulty urinating.  Skin: Positive for wound (right knee). Negative for pallor.  Neurological: Positive for dizziness (preceding LOC) and syncope. Negative for weakness and numbness.     Physical Exam Updated Vital Signs BP 109/74   Pulse 84   Temp 98.2 F (36.8 C) (Oral)   Resp 14   SpO2 96%   Physical Exam  Constitutional: He is oriented to person,  place, and time. He appears well-developed and well-nourished. No distress.  HENT:  Head: Normocephalic and atraumatic.  Eyes: Conjunctivae and EOM are normal. Pupils are equal, round, and reactive to light.  Neck: Normal range of motion. Neck supple. No JVD present.  Cardiovascular: Normal rate, regular rhythm, normal heart sounds and intact distal pulses.   No murmur heard. Pulmonary/Chest: Effort normal and breath sounds normal. No respiratory distress. He exhibits no tenderness.  Abdominal: Soft. Bowel sounds are normal. He exhibits no distension  and no mass. There is no tenderness.  Musculoskeletal: Normal range of motion. He exhibits no edema, tenderness or deformity.  Small area of erythema over the right patella (about 0.5cm diameter)  Neurological: He is alert and oriented to person, place, and time.  Skin: Skin is warm and dry. Capillary refill takes less than 2 seconds. He is not diaphoretic. There is erythema (small area over right patella).  Psychiatric: He has a normal mood and affect. His behavior is normal. Judgment and thought content normal.  Nursing note and vitals reviewed.    ED Treatments / Results  Labs (all labs ordered are listed, but only abnormal results are displayed) Labs Reviewed  BASIC METABOLIC PANEL - Abnormal; Notable for the following:       Result Value   Glucose, Bld 193 (*)    All other components within normal limits  CBC - Abnormal; Notable for the following:    RBC 4.21 (*)    All other components within normal limits  PROTIME-INR - Abnormal; Notable for the following:    Prothrombin Time 17.2 (*)    All other components within normal limits  I-STAT TROPOININ, ED  I-STAT TROPOININ, ED    EKG  EKG Interpretation  Date/Time:  Sunday July 16 2016 15:33:02 EDT Ventricular Rate:  93 PR Interval:  184 QRS Duration: 76 QT Interval:  350 QTC Calculation: 435 R Axis:   65 Text Interpretation:  Normal sinus rhythm Possible Left atrial  enlargement Low voltage QRS Borderline ECG Confirmed by Isla Pence [phone]) on 07/16/2016 4:09:45 PM       Radiology Dg Chest 2 View  Result Date: 07/16/2016 CLINICAL DATA:  Patient with chest tightness.  Syncopal episode. EXAM: CHEST  2 VIEW COMPARISON:  Chest radiograph 06/25/2016 FINDINGS: Monitoring leads overlie the patient. Heart is enlarged. Heterogeneous opacities right lung base. No pleural effusion or pneumothorax. Thoracic spine degenerative changes. Mitral valve repair. IMPRESSION: Heterogeneous opacities right lung base may represent atelectasis or infection. Cardiomegaly. Electronically Signed   By: Lovey Newcomer M.D.   On: 07/16/2016 16:25   Ct Head Wo Contrast  Result Date: 07/16/2016 CLINICAL DATA:  Recent fall with syncopal episode EXAM: CT HEAD WITHOUT CONTRAST TECHNIQUE: Contiguous axial images were obtained from the base of the skull through the vertex without intravenous contrast. COMPARISON:  None. FINDINGS: Brain: No evidence of acute infarction, hemorrhage, hydrocephalus, extra-axial collection or mass lesion/mass effect. Vascular: No hyperdense vessel or unexpected calcification. Skull: Normal. Negative for fracture or focal lesion. Sinuses/Orbits: No acute finding. Other: None. IMPRESSION: No acute abnormality noted. Electronically Signed   By: Inez Catalina M.D.   On: 07/16/2016 17:48   Ct Angio Chest Pe W Or Wo Contrast  Result Date: 07/16/2016 CLINICAL DATA:  Chest tightness with dyspnea around 8 a.m. after mitral valve replacement 06/21/2016. EXAM: CT ANGIOGRAPHY CHEST WITH CONTRAST TECHNIQUE: Multidetector CT imaging of the chest was performed using the standard protocol during bolus administration of intravenous contrast. Multiplanar CT image reconstructions and MIPs were obtained to evaluate the vascular anatomy. CONTRAST:  100 cc Isovue 370 IV COMPARISON:  06/16/2016 chest CT FINDINGS: Cardiovascular: Mitral valve replacement. Coronary arteriosclerosis along the RCA and  LAD. No pericardial effusion. No aortic aneurysm or pulmonary embolus. Mediastinum/Nodes: No enlarged mediastinal, hilar, or axillary lymph nodes. Thyroid gland, trachea, and esophagus demonstrate no significant findings. Lungs/Pleura: New small right pleural effusion. Five mm nodular density noted along the right middle lobe, series 8, image 41, 6 mm nodular density in the superior  segment of right lower lobe series 8, image 37 and 5 mm nodular density within the major fissure, series 8, image 44 are identified possibly representing small perifissural or intrafissural lymph nodes, partial imaging of ectatic vessels or small pulmonary nodules. Small right and trace left effusions with adjacent atelectasis. No pneumothorax. Upper Abdomen: Rounded 13 mm left hepatic hypodensity series 7 image 264 consistent with a cyst or hemangioma. Further characterization not possible. Findings are stable since previous exam. Musculoskeletal: Thoracolumbar spondylosis. No acute nor suspicious osseous abnormality. Diffuse idiopathic skeletal hyperostosis. Review of the MIP images confirms the above findings. IMPRESSION: 1. New small and trace left pleural effusions with atelectasis. No pneumonic consolidation or effusion. 2. Five and 6 mm nodular densities within the right middle lobe and superior segment right lower lobe possibly representing small intrafissural/perifissural lymph nodes, partial imaging of ectatic vessels or small pulmonary nodules. Non-contrast chest CT at 3-6 months is recommended. If the nodules are stable at time of repeat CT, then future CT at 18-24 months (from today's scan) is considered optional for low-risk patients, but is recommended for high-risk patients. This recommendation follows the consensus statement: Guidelines for Management of Incidental Pulmonary Nodules Detected on CT Images: From the Fleischner Society 2017; Radiology 2017; 284:228-243. 3. No acute pulmonary embolus. Status post MVR.  Coronary arteriosclerosis along the LAD and RCA. Electronically Signed   By: Ashley Royalty M.D.   On: 07/16/2016 18:05    Procedures Procedures (including critical care time)  Medications Ordered in ED Medications  sodium chloride 0.9 % bolus 1,000 mL (1,000 mLs Intravenous New Bag/Given 07/16/16 1655)  iopamidol (ISOVUE-370) 76 % injection (100 mLs Intravenous Contrast Given 07/16/16 1717)     Initial Impression / Assessment and Plan / ED Course  I have reviewed the triage vital signs and the nursing notes.  Pertinent labs & imaging results that were available during my care of the patient were reviewed by me and considered in my medical decision making (see chart for details).    4:10 pm: EKG reviewed.  No arrhythmia.  Cardiac monitoring without arrhythmia. VSS.  06/21/16 echo reviewed.  EF 50-55%.  1L NS bolus ordered.  BMP, CBC, iTrop, PT/INR, CXR.  No focal findings on exam.  Left zygomatic process without bony abnormality.   However, in the setting of fall and current anticoagulation, CT head ordered.  07/11/16 INR not at goal.  Rock Nephew' 1.5, PERC 2+.  CTA chest ordered to r/o PE in setting of recent surgery and sub-optimal anticoagulation.  Patient care discussed with Dr Gilford Raid.  4:42pm: No leukocytosis.  Hgb stable at 13.2.  iTrop negative.  BMP unremarkable.  BG 193.  5:54pm: CT head negative for acute processes.  6:16pm: CTA chest is negative for PE.    6:27: pm orthostatics negative.  Dr Gilford Raid discussed admission with patient, who refuses.  Patient has appt with cardiac surgeon, Dr Roxy Manns, tomorrow.  Will obtain additional iTrop and ambulate patient.  If iTrop negative and patient ambulates without difficulty will allow discharge home w/ close follow up with cardiac surgeon.  Final Clinical Impressions(s) / ED Diagnoses   Final diagnoses:  Syncope   Charistopher Rumble Tayvin Preslar. is a 71 y.o. male that presents with syncopal episode about 3.5 weeks after mitral valve repair.  His workup  thus far has been unrevealing as to the cause but he has a significant heart history that is concerning for cardiac etiology of loss of consciousness.  No evidence of infection, arrhythmia, PE, ischemia, or  intracranial process.  Findings were discussed with patient.  Recommended admission for inpatient evaluation.  Patient declined this.  He had additional iTrop obtained and was ambulated.  Repeat Itrop negative.  He ambulated without difficulty.  He has close follow up with Dr Roxy Manns tomorrow.  Strict return precautions reviewed.  Patient and wife voiced good understanding.  Additionally, his CTA chest revealed no PT but did show several nodular densities in the right lobes that require follow up CT in several months.  This will require f/u by PCP outpatient.  New Prescriptions New Prescriptions   No medications on file     Janora Norlander, DO 07/16/16 1859    Isla Pence, MD 07/16/16 2005

## 2016-07-16 NOTE — ED Triage Notes (Signed)
Pt to ER for acute chest tightness onset this morning with syncopal episode. Pt had mitral valve repair Jun 21 2016. Denies chest tightness at present. No hx of having stents placed in the past. Pt reports he checked his pulse today and it was in the 90's, which states is abnormal for him.

## 2016-07-17 ENCOUNTER — Ambulatory Visit (HOSPITAL_COMMUNITY): Payer: Self-pay | Admitting: Dentistry

## 2016-07-17 ENCOUNTER — Telehealth: Payer: Self-pay

## 2016-07-17 ENCOUNTER — Ambulatory Visit (INDEPENDENT_AMBULATORY_CARE_PROVIDER_SITE_OTHER): Payer: Self-pay | Admitting: Physician Assistant

## 2016-07-17 ENCOUNTER — Encounter (HOSPITAL_COMMUNITY): Payer: Self-pay | Admitting: Dentistry

## 2016-07-17 ENCOUNTER — Encounter: Payer: Self-pay | Admitting: Physician Assistant

## 2016-07-17 ENCOUNTER — Telehealth: Payer: Self-pay | Admitting: Internal Medicine

## 2016-07-17 VITALS — BP 108/74 | HR 93 | Resp 16 | Ht 68.5 in | Wt 192.8 lb

## 2016-07-17 VITALS — BP 109/72 | HR 89 | Temp 98.6°F

## 2016-07-17 DIAGNOSIS — R29898 Other symptoms and signs involving the musculoskeletal system: Secondary | ICD-10-CM

## 2016-07-17 DIAGNOSIS — Z9889 Other specified postprocedural states: Secondary | ICD-10-CM

## 2016-07-17 DIAGNOSIS — K08109 Complete loss of teeth, unspecified cause, unspecified class: Secondary | ICD-10-CM

## 2016-07-17 NOTE — Progress Notes (Signed)
PROGRESS NOTE:  07/17/2016 David Carr. [id number]  VITALS: BP 109/72 (BP Location: Left Arm)   Pulse 89   Temp 98.6 F (37 C) (Oral)   LABS:  Lab Results  Component Value Date   WBC 5.4 07/16/2016   HGB 13.2 07/16/2016   HCT 39.3 07/16/2016   MCV 93.3 07/16/2016   PLT 154 07/16/2016   BMET    Component Value Date/Time   NA 140 07/16/2016 1536   NA 139 07/03/2016 1233   NA 138 12/02/2015 1335   K 4.4 07/16/2016 1536   K 4.1 12/02/2015 1335   CL 106 07/16/2016 1536   CO2 27 07/16/2016 1536   CO2 24 12/02/2015 1335   GLUCOSE 193 (H) 07/16/2016 1536   GLUCOSE 252 (H) 12/02/2015 1335   BUN 8 07/16/2016 1536   BUN 15 07/03/2016 1233   BUN 14.2 12/02/2015 1335   CREATININE 0.92 07/16/2016 1536   CREATININE 0.90 01/03/2016 1056   CREATININE 1.0 12/02/2015 1335   CALCIUM 9.2 07/16/2016 1536   CALCIUM 9.0 12/02/2015 1335   GFRNONAA >60 07/16/2016 1536   GFRAA >60 07/16/2016 1536    Lab Results  Component Value Date   INR 1.39 07/16/2016   INR 1.2 07/11/2016   INR 3.3 07/03/2016   No results found for: PTT   David Carr. is status post extraction of remaining teeth with alveoloplasty and pre-prosthetic surgery as needed in the operating room on 06/08/2016. Patient was seen on 06/19/2016 for postop evaluation and suture removal. The patient then proceeded with minimally invasive mitral valve repair on 06/21/2016. Patient now presents for re-evaluation of extraction sites.  SUBJECTIVE: Patient with minimal complaints. Patient was seen in the emergency room yesterday for history of syncope. Patient has follow-up examination with Dr. Roxy Manns later this morning.  Patient indicates that the previosly exposed bone Involving the lower right lingual mandible is very loose and is about to come out.  EXAM: The patient is now edentulous. There is an area of exposed bone involving the lower right lingual aspect area #32 that is loose and is sequestering out. This  measures approximately 4 mm by 8 mm. No apparent sinus problems noted. Radiographic interpretation: Orthopantogram was taken. Patient is now edentulous. No apparent retained root tips are noted.  PROCEDURE: The patient was given a chlorhexidine gluconate rinse for 30 seconds. The loose piece of sequestered bone was removed with a soft tissue pickups without complications. Patient tolerated the procedure well.  ASSESSMENT: 1. Patient is completely edentulous 2. The area of exposed bone has sequestered out and was removed today without complication.  3. There is atrophy of the edentulous alveolar ridges. 4.  Suggest follow-up with an oral and maxillofacial prosthodontist for evaluation for upper and lower complete denture fabrication with or without implants as indicated.   PLAN: 1.  Continue to use chlorhexidine rinses twice daily in a swish and spit manner. Use after breakfast and at bedtime. 2.  Continue salt water rinses every 2 hours while awake in between the chlorhexidine rinses.  3.  Advance diet as tolerated but continue soft diet only with nutritional supplementation as needed. 4.  Patient agrees to be referred to Dr. Vernard Gambles, oral maxillofacial prosthodontist, for evaluation for upper and lower complete dentures with or without implants therapy. Release of information was obtained today. 5. Suggested follow-up for evaluation of lower right healing in 2 weeks, however, patient refused at this time.  Lenn Cal, DDS

## 2016-07-17 NOTE — Telephone Encounter (Signed)
David Carr, please ask him to decrease Basaglar to 20 units but to take the Metformin >> get back to [place] about the sugars in 3 days or sooner if still dropping.

## 2016-07-17 NOTE — Telephone Encounter (Signed)
Pt woke up with a 60 BS reading, yesterday he passed out and ended up in the ER. Pt would like to talk with the nurse before the end of today [phone]

## 2016-07-17 NOTE — Patient Instructions (Addendum)
PLAN: 1.  Continue to use chlorhexidine rinses twice daily in a swish and spit manner. Use after breakfast and at bedtime. 2.  Continue salt water rinses every 2 hours while awake in between the chlorhexidine rinses.  3.  Advance diet as tolerated but continue soft diet only with nutritional supplementation as needed. 4.  Patient agrees to be referred to Dr. Vernard Gambles, oral maxillofacial prosthodontist, for evaluation for upper and lower complete dentures with or without implants therapy. Release of information was obtained today. 5. Suggested follow-up for evaluation of lower right healing in 2 weeks, however, patient refused at this time.  David Carr, [place]    Department of Dental Medicine     DR. Audrey Eller      HEART VALVES AND MOUTH CARE:  FACTS:   If you have any infection in your mouth, it can infect your heart valve.  If you heart valve is infected, you will be seriously ill.  Infections in the mouth can be SILENT and do not always cause pain.  Examples of infections in the mouth are gum disease, dental cavities, and abscesses.  Some possible signs of infection are: Bad breath, bleeding gums, or teeth that are sensitive to sweets, hot, and/or cold. There are many other signs as well.  WHAT YOU HAVE TO DO:   Brush your teeth after meals and at bedtime. Spend at least 2 minutes brushing well, especially behind your back teeth and all around your teeth that stand alone. Brush at the gumline also.  Do not go to bed without brushing your teeth and flossing.  If you gums bleed when you brush or floss, do NOT stop brushing or flossing. It usually means that your gums need more attention and better cleaning.   If your Dentist or Dr. Enrique Sack gave you a prescription mouthwash to use, make sure to use it as directed. If you run out of the medication, get a refill at the pharmacy.   If you were given any other medications or directions by your  Dentist, please follow them. If you did not understand the directions or forget what you were told, please call. We will be happy to refresh her memory.  If you need antibiotics before dental procedures, make sure you take them one hour prior to every dental visit as directed.   Get a dental checkup every 4-6 months in order to keep your mouth healthy, or to find and treat any new infection. You will most likely need your teeth cleaned or gums treated at the same time.  If you are not able to come in for your scheduled appointment, call your Dentist as soon as possible to reschedule.  If you have a problem in between dental visits, call your Dentist.

## 2016-07-17 NOTE — Addendum Note (Signed)
Addended by: Elnita Maxwell R on: 07/17/2016 02:56 PM   Modules accepted: Orders

## 2016-07-17 NOTE — Telephone Encounter (Signed)
Called and spoke with patient. Submitted MD questions.

## 2016-07-17 NOTE — Progress Notes (Signed)
Referral to Cardiac Rehab sent to [facility]

## 2016-07-17 NOTE — Telephone Encounter (Signed)
Called and advised of note. Patient understood.

## 2016-07-17 NOTE — Telephone Encounter (Signed)
Called and advised of note. Patient understood.Marland Kitchen

## 2016-07-17 NOTE — Patient Instructions (Addendum)
1. Make every effort to keep your diabetes under very tight control.  Follow up closely with your primary care physician or endocrinologist and strive to keep their hemoglobin A1c levels as low as possible, preferably near or below 6.0.  The long term benefits of strict control of diabetes are far reaching and critically important for your overall health and survival.  2. Endocarditis is a potentially serious infection of heart valves or inside lining of the heart.  It occurs more commonly in patients with diseased heart valves (such as patient's with aortic or mitral valve disease) and in patients who have undergone heart valve repair or replacement. Certain surgical and dental procedures may put you at risk, such as dental cleaning, other dental procedures, or any surgery involving the respiratory, urinary, gastrointestinal tract, gallbladder or prostate gland.  To minimize your chances for develooping endocarditis, maintain good oral health and seek prompt medical attention for any infections involving the mouth, teeth, gums, skin or urinary tract.   Always notify your doctor or dentist about your underlying heart valve condition before having any invasive procedures. You will need to take antibiotics before certain procedures, including all routine dental cleanings or other dental procedures.  Your cardiologist or dentist should prescribe these antibiotics for you to be taken ahead of time.  3.You may continue to gradually increase your physical activity as tolerated.  Avoid activities that cause increased pain in your chest on the side of your surgical incision.  Otherwise, you may continue to increase activities without any particular limitations.  Increase the intensity and duration of physical activity gradually.  4. You are encouraged to enroll and participate in the outpatient cardiac rehab program beginning as soon as practical.

## 2016-07-17 NOTE — Progress Notes (Signed)
[address]       [place] Carr             [phone]       CARDIAC SURGERY POSTOPERATIVE VISIT  Patient Name: David Carr. MRN: [id number] DOB: 1945/10/08  Subjective: David Carr. is a 71 y.o. male here for a routine post op visit as he is s/p minimally invasive mitral valve repair, complex valvuloplasty, neo chords x 6 by Dr. Roxy Carr on 06/21/2016. He presented to [facility] yesterday with complaints of chest tightness, dizziness, and syncope. CT head was negative and CT angio of chest showed trace left pleural effusion, atelectasis, NO PE or pericardial effusion, 5-6 mm nodular densities in the RML and superior segment of the RLL (possibly small lymph nodes), and coronary atherosclerosis LAD and RCA.  Troponin < or = to 0.03. EKG showed sinus rhythm and LA enlargement. Patient states he thinks his symptoms might be related to his diabetes. His sugar went down into the 60's this am and he had diaphoresis and "got shaky" ,but his glucose at times has been in the 70-80's and he still does not feel well. He eats "sugary" things and tends to feel better.  Past Medical History:  Diagnosis Date  . AML (acute myeloid leukemia) in remission [facility]) oncologist-  dr David Carr-- per last note 10/ 2017 in remission 2 years   dx 09-11-2013  via bone marrow bx , FLT3 negative (David M1 +)/  chemotherapy started 09-17-2013,  remission via marrow bx 11-06-2013,  4 cycles consolidation chemo w/ HiDAC 11-13-2013 to 03-05-2014  . BPH with urinary obstruction   . Chronic thrombocytopenic purpura ([facility])   . Dental caries    periodontitis  . Dyspnea   . GERD (gastroesophageal reflux disease)   . Heart murmur   . History of kidney stones   . Hyperlipidemia   . Hypertension   . MVP (mitral valve prolapse)   . Pancytopenia, acquired ([place])   . Right ureteral stone   . 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   Complex valvuloplasty including triangular  resection of flail segment of posterior leaflet, artificial Gore-tex neochord placement x6 and 28m Sorin Memo 3D ring annuloplasty via right mini thoracotomy approach  . Severe mitral regurgitation   . Type 2 diabetes mellitus with hypoglycemia, with long-term current use of insulin ([facility]    endocrinologist-  dr David Carr . Wears denture    upper   Prior to Admission medications   Medication Sig Start Date End Date Taking? Authorizing Provider  acetaminophen (TYLENOL) 500 MG tablet Take 2 tablets (1,000 mg total) by mouth every 6 (six) hours as needed. 06/26/16   Carr, ELodema Hong Carr  aspirin 81 MG tablet Take 81 mg by mouth daily with supper.     [provider]  atenolol (TENORMIN) 25 MG tablet Take 0.5 tablets (12.5 mg total) by mouth daily. 07/03/16   Carr, REvelene Croon Carr  chlorhexidine (PERIDEX) 0.12 % solution Rinse with 15 mls twice daily for 30 seconds. Use after breakfast and at bedtime. Spit out excess. Do not swallow. 06/19/16 12/19/16  KLenn Cal Carr  furosemide (LASIX) 40 MG tablet Take 1 tablet (40 mg total) by mouth daily. Patient not taking: Reported on 07/16/2016 06/27/16   Carr, ELodema Hong Carr  Insulin Glargine (BASAGLAR KWIKPEN) 100 UNIT/ML SOPN INJECT 30 UNITS INTO THE SKIN AT BEDTIME. 05/30/16   GPhilemon Kingdom MD  magnesium oxide (  MAG-OX) 400 (241.3 Mg) MG tablet Take 1 tablet (400 mg total) by mouth 2 (two) times daily. Patient not taking: Reported on 07/16/2016 06/26/16   Carr, David Hong, Carr  metFORMIN (GLUCOPHAGE-XR) 500 MG 24 hr tablet Take 1 tablet (500 mg total) by mouth daily with supper. Patient taking differently: Take 1,000 mg by mouth daily with supper.  05/09/16   David Kingdom, MD  potassium chloride SA (K-DUR,KLOR-CON) 20 MEQ tablet Take 2 tablets (40 mEq total) by mouth daily. Patient not taking: Reported on 07/16/2016 06/27/16   Carr, David Hong, Carr  simvastatin (ZOCOR) 20 MG tablet Take 1 tablet (20 mg total) by mouth at bedtime. 10/26/15    David Cookey, MD  traMADol (ULTRAM) 50 MG tablet Take 1 tablet (50 mg total) by mouth every 4 (four) hours as needed for moderate pain. 06/26/16   Carr, David R, Carr  warfarin (COUMADIN) 2.5 MG tablet Take 1 tablet (2.5 mg total) by mouth daily at 6 PM. 06/26/16   Carr, David Hong, Carr   Physical Exam: BP 108/74, HR 93, RR 16, Oxygen saturation 97% on room air.  GENERAL: Well-nourished, well-developed, in no acute distress CARDIOVASCULAR: Regular rate and rhythm. No murmurs. No peripheral edema. RESPIRATORY: Respiratory effort is normal. Lungs clear to auscultation. ABDOMEN: Bowel sounds present. No masses or tenderness. WOUNDS: Clean and dry.   Imaging Studies: CXR done yesterday while in ER:  CLINICAL DATA:  Patient with chest tightness.  Syncopal episode.  EXAM: CHEST  2 VIEW  COMPARISON:  Chest radiograph 06/25/2016  FINDINGS: Monitoring leads overlie the patient. Heart is enlarged. Heterogeneous opacities right lung base. No pleural effusion or pneumothorax. Thoracic spine degenerative changes. Mitral valve repair.  IMPRESSION: Heterogeneous opacities right lung base may represent atelectasis or infection.  Cardiomegaly.   Electronically Signed   By: David Newcomer M.D.   On: 07/16/2016 16:25  Impression/Plan: Overall, [name] is recovering from minimally invasive mitral valve surgery. He has already seen David Carr in cardiology follow up on 07/03/2016. She stopped his Lasix and potassium as he was having dizziness at that time and felt he was orthostatic. He is still taking Atenolol 12.5 mg daily as he has been for years. He has an appointment to have his INR checked in the am (INR yesterday was 1.39). He will see Dr. Percival Carr on 10/03/2016. He has not taken pain medication in weeks and has already been driving.  He has an appointment to see Dr. Renne Carr in July, but he is going to stop by the office today with his diabetes medication concerns. He  states he is hoping that Metformin XR will be stopped. Patient encouraged to participate in cardiac rehab. He was also instructed he needs to have antibiotic treatment prior to undergoing dental procedures. Dr. Roxy Carr told him he may have implants (if needed) once he heals from current mouth surgery. Dr. Roxy Carr has seen and evaluated Mr. Tunney on this office visit. Patient will return to see Dr. Roxy Carr in a few months. He will determine if nodular densities in right lung need further follow up imaging as well.  Lars Pinks, Carr 07/17/2016 1:23 PM

## 2016-07-17 NOTE — Telephone Encounter (Signed)
Patient stopped by and requested that I call him regarding his sugar. I called patient back. He told me that he had his heart surgery on 06/20/16. Yesterday he stated having chest pains, he sat down and rested and they went away. Later that afternoon he got up from his chair and started walking and passed out in the floor. He was taken to the hospital (per note in chart), they sent him home after all test came back and were okay. About 4 hours after he got home from the hospital he checked his sugar it was 86. He had taken his metformin that day. When he woke up this morning it was 60. He states that he had no symptoms of his sugar crashing, which scared him. He went to his Cardiologist today who stated maybe the metformin and the insulin were too much on him now since the surgery. Patient would like to know if it would be okay to not take the metfomin tonight and see if that helps him. He is scared he will start dropping again and not know. Please advise. Thank you!

## 2016-07-17 NOTE — Addendum Note (Signed)
Addendum  created 07/17/16 1424 by Oleta Mouse, MD   Sign clinical note

## 2016-07-18 ENCOUNTER — Ambulatory Visit (INDEPENDENT_AMBULATORY_CARE_PROVIDER_SITE_OTHER): Payer: 59 | Admitting: Pharmacist Clinician (PhC)/ Clinical Pharmacy Specialist

## 2016-07-18 ENCOUNTER — Telehealth: Payer: Self-pay | Admitting: Family Medicine

## 2016-07-18 ENCOUNTER — Other Ambulatory Visit: Payer: Self-pay

## 2016-07-18 DIAGNOSIS — Z9889 Other specified postprocedural states: Secondary | ICD-10-CM | POA: Diagnosis not present

## 2016-07-18 DIAGNOSIS — Z7901 Long term (current) use of anticoagulants: Secondary | ICD-10-CM | POA: Diagnosis not present

## 2016-07-18 LAB — POCT INR: INR: 1.2

## 2016-07-18 MED ORDER — GLUCOSE BLOOD VI STRP: ORAL_STRIP | 5 refills | Status: DC

## 2016-07-18 NOTE — Telephone Encounter (Signed)
Submitted and advised with patient.

## 2016-07-18 NOTE — Telephone Encounter (Signed)
Patient needs a new script for insulin test strips. Please call to discuss.  Thank you,  -LL

## 2016-07-18 NOTE — Telephone Encounter (Signed)
Called patient and advised of which test strips he needed submitted. Advised I was sending it today.

## 2016-07-25 ENCOUNTER — Ambulatory Visit (INDEPENDENT_AMBULATORY_CARE_PROVIDER_SITE_OTHER): Payer: 59 | Admitting: Pharmacist Clinician (PhC)/ Clinical Pharmacy Specialist

## 2016-07-25 DIAGNOSIS — Z7901 Long term (current) use of anticoagulants: Secondary | ICD-10-CM

## 2016-07-25 DIAGNOSIS — Z9889 Other specified postprocedural states: Secondary | ICD-10-CM

## 2016-07-25 LAB — POCT INR: INR: 1.6

## 2016-07-27 ENCOUNTER — Telehealth: Payer: Self-pay

## 2016-07-27 NOTE — Telephone Encounter (Signed)
Called patient and advised of note, patient understood no questions at this time.

## 2016-07-27 NOTE — Telephone Encounter (Signed)
Please ask him to start a CBG log and give me the values to decide about the insulin doses. For now, we can decrease the insulin to 16 units.

## 2016-07-27 NOTE — Telephone Encounter (Signed)
Patient called in states that he is still having dizzy spells, and he doesn't not want to keep having them because he can not do anything around the house. He is not passing out like before, but this morning he woke up and his blood sugar was 100 after having coffee with sugar. Patient wants to know which direction to go too. This was previous note and changes made:  Almyra Free, please ask him to decrease Basaglar to 20 units but to take the Metformin >> get back to [place] about the sugars in 3 days or sooner if still dropping.    Please advise. Thank you!

## 2016-08-01 ENCOUNTER — Ambulatory Visit (INDEPENDENT_AMBULATORY_CARE_PROVIDER_SITE_OTHER): Payer: 59 | Admitting: Pharmacist

## 2016-08-01 DIAGNOSIS — Z7901 Long term (current) use of anticoagulants: Secondary | ICD-10-CM

## 2016-08-01 DIAGNOSIS — Z9889 Other specified postprocedural states: Secondary | ICD-10-CM | POA: Diagnosis not present

## 2016-08-01 LAB — POCT INR: INR: 1.6

## 2016-08-02 ENCOUNTER — Telehealth (HOSPITAL_COMMUNITY): Payer: Self-pay

## 2016-08-02 NOTE — Telephone Encounter (Signed)
I called and left message on patient voicemail to call office about scheduling for cardiac rehab. I left office contact information on patient voicemail to return call.  ° °

## 2016-08-08 ENCOUNTER — Ambulatory Visit (INDEPENDENT_AMBULATORY_CARE_PROVIDER_SITE_OTHER): Payer: 59 | Admitting: Pharmacist

## 2016-08-08 DIAGNOSIS — Z7901 Long term (current) use of anticoagulants: Secondary | ICD-10-CM

## 2016-08-08 DIAGNOSIS — Z9889 Other specified postprocedural states: Secondary | ICD-10-CM

## 2016-08-08 LAB — POCT INR: INR: 1.8

## 2016-08-09 ENCOUNTER — Other Ambulatory Visit: Payer: Self-pay | Admitting: Family Medicine

## 2016-08-09 ENCOUNTER — Encounter (HOSPITAL_COMMUNITY): Payer: Self-pay

## 2016-08-09 DIAGNOSIS — E785 Hyperlipidemia, unspecified: Secondary | ICD-10-CM

## 2016-08-09 NOTE — Telephone Encounter (Signed)
Sent to the pharmacy for #14.  Message received from the pharmacy stating he has mail order coming but will not be here until 08/17/16.

## 2016-08-10 ENCOUNTER — Telehealth: Payer: Self-pay | Admitting: Physician Assistant

## 2016-08-10 MED ORDER — WARFARIN SODIUM 2.5 MG PO TABS: ORAL_TABLET | ORAL | 3 refills | Status: DC

## 2016-08-10 NOTE — Telephone Encounter (Signed)
New message     *STAT* If patient is at the pharmacy, call can be transferred to refill team.   1. Which medications need to be refilled? (please list name of each medication and dose if known)warfarin (COUMADIN) 2.5 MG tablet  2. Which pharmacy/location (including street and city if local pharmacy) is medication to be sent to? [facility] [address]  3. Do they need a 30 day or 90 day supply? 30 day   Pt states the pharmacy told him he needs a new prescription

## 2016-08-22 ENCOUNTER — Ambulatory Visit (INDEPENDENT_AMBULATORY_CARE_PROVIDER_SITE_OTHER): Payer: 59 | Admitting: Cardiology

## 2016-08-22 ENCOUNTER — Telehealth (HOSPITAL_COMMUNITY): Payer: Self-pay | Admitting: Cardiac Rehabilitation

## 2016-08-22 ENCOUNTER — Encounter: Payer: Self-pay | Admitting: Cardiology

## 2016-08-22 ENCOUNTER — Ambulatory Visit (INDEPENDENT_AMBULATORY_CARE_PROVIDER_SITE_OTHER): Payer: 59 | Admitting: Pharmacist

## 2016-08-22 VITALS — BP 130/62 | HR 90 | Ht 68.5 in | Wt 190.6 lb

## 2016-08-22 DIAGNOSIS — I493 Ventricular premature depolarization: Secondary | ICD-10-CM

## 2016-08-22 DIAGNOSIS — Z7901 Long term (current) use of anticoagulants: Secondary | ICD-10-CM

## 2016-08-22 DIAGNOSIS — R079 Chest pain, unspecified: Secondary | ICD-10-CM | POA: Diagnosis not present

## 2016-08-22 DIAGNOSIS — H8110 Benign paroxysmal vertigo, unspecified ear: Secondary | ICD-10-CM

## 2016-08-22 DIAGNOSIS — C9201 Acute myeloblastic leukemia, in remission: Secondary | ICD-10-CM | POA: Diagnosis not present

## 2016-08-22 DIAGNOSIS — R55 Syncope and collapse: Secondary | ICD-10-CM | POA: Diagnosis not present

## 2016-08-22 DIAGNOSIS — R0600 Dyspnea, unspecified: Secondary | ICD-10-CM

## 2016-08-22 DIAGNOSIS — Z9889 Other specified postprocedural states: Secondary | ICD-10-CM | POA: Diagnosis not present

## 2016-08-22 LAB — CBC
Hematocrit: 43.9 % (ref 37.5–51.0)
Hemoglobin: 15.6 g/dL (ref 13.0–17.7)
MCH: 32.6 pg (ref 26.6–33.0)
MCHC: 35.5 g/dL (ref 31.5–35.7)
MCV: 92 fL (ref 79–97)
Platelets: 132 10*3/uL — ABNORMAL LOW (ref 150–379)
RBC: 4.79 x10E6/uL (ref 4.14–5.80)
RDW: 14.6 % (ref 12.3–15.4)
WBC: 4.9 10*3/uL (ref 3.4–10.8)

## 2016-08-22 LAB — POCT INR: INR: 2.2

## 2016-08-22 NOTE — Progress Notes (Signed)
  08/22/2016 David P Coronado Jr.   03/31/1945  [id number]  Primary Physician Todd, Jeffrey A, MD Primary Cardiologist: Dr Hochrein  HPI:  71 y.o. male with a history of severe MR, AML, DM, HTn, and vertigo. He had Minimally Invasive Mitral Valve Repair with valvuloplasty, ring annuloplasty, and placement of Neo Chord 06/30/16. Cath prior tthis showed minor CAD.. He was recovering well till three weeks ago when he had a syncopal spell. He says he got up to walk down the hall and went down. He has some memory of the event. He denies tachycardia, nausea, or diaphoresis. He went to the ED where his BS was noted to be 80. His insulin was adjusted and he says he has not had any further episodes.  He is in the office today with complaints of SSCP and vague dyspnea. He was concerned there was a problem with the valve repair. He also seems to have had an exacerbation of his chronic vertigo.    Current Outpatient Prescriptions  Medication Sig Dispense Refill  . acetaminophen (TYLENOL) 500 MG tablet Take 2 tablets (1,000 mg total) by mouth every 6 (six) hours as needed. 30 tablet 0  . aspirin 81 MG tablet Take 81 mg by mouth daily with supper.     . atenolol (TENORMIN) 25 MG tablet Take 0.5 tablets (12.5 mg total) by mouth daily. 30 tablet 3  . chlorhexidine (PERIDEX) 0.12 % solution Rinse with 15 mls twice daily for 30 seconds. Use after breakfast and at bedtime. Spit out excess. Do not swallow. 480 mL prn  . Insulin Glargine (BASAGLAR KWIKPEN) 100 UNIT/ML SOPN Inject 16 Units into the skin at bedtime.    . metFORMIN (GLUCOPHAGE-XR) 500 MG 24 hr tablet Take 1 tablet by mouth daily with supper.    . simvastatin (ZOCOR) 20 MG tablet Take 1 tablet (20 mg total) by mouth at bedtime. 90 tablet 3  . traMADol (ULTRAM) 50 MG tablet Take 1 tablet (50 mg total) by mouth every 4 (four) hours as needed for moderate pain. 30 tablet 0  . warfarin (COUMADIN) 2.5 MG tablet Take 1 and 1/2 to 2 tablet daily as directed  by coumadin clinic 60 tablet 3   No current facility-administered medications for this visit.    Facility-Administered Medications Ordered in Other Visits  Medication Dose Route Frequency Provider Last Rate Last Dose  . heparin lock flush 100 unit/mL  500 Units Intravenous Once Kale, Gautam Kishore, MD      . sodium chloride 0.9 % injection 10 mL  10 mL Intravenous PRN Kale, Gautam Kishore, MD        Allergies  Allergen Reactions  . Prochlorperazine Other (See Comments)    ARRHYTHMIAS  . Morphine And Related     Shuts bladder down.    Past Medical History:  Diagnosis Date  . AML (acute myeloid leukemia) in remission ([facility]) oncologist-  dr gorsuch-- per last note 10/ 2017 in remission 2 years   dx 09-11-2013  via bone marrow bx , FLT3 negative (NP M1 +)/  chemotherapy started 09-17-2013,  remission via marrow bx 11-06-2013,  4 cycles consolidation chemo w/ HiDAC 11-13-2013 to 03-05-2014  . BPH with urinary obstruction   . Chronic thrombocytopenic purpura ([facility])   . Dental caries    periodontitis  . Dyspnea   . GERD (gastroesophageal reflux disease)   . Heart murmur   . History of kidney stones   . Hyperlipidemia   . Hypertension   . MVP (mitral   valve prolapse)   . Pancytopenia, acquired ([facility])   . Right ureteral stone   . 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   Complex valvuloplasty including triangular resection of flail segment of posterior leaflet, artificial Gore-tex neochord placement x6 and 32mm Sorin Memo 3D ring annuloplasty via right mini thoracotomy approach  . Severe mitral regurgitation   . Type 2 diabetes mellitus with hypoglycemia, with long-term current use of insulin ([facility])    endocrinologist-  dr ghergge  . Wears denture    upper    Social History   Social History  . Marital status: Married    Spouse name: N/A  . Number of children: 2  . Years of education: N/A   Occupational History  . Not on file.   Social History Main Topics  .  Smoking status: Former Smoker    Years: 20.00    Types: Cigarettes    Quit date: 04/18/1974  . Smokeless tobacco: Never Used  . Alcohol use No  . Drug use: No  . Sexual activity: Not Currently   Other Topics Concern  . Not on file   Social History Narrative  . No narrative on file     Family History  Problem Relation Age of Onset  . Asthma Sister   . Cancer Daughter        carcinoid tumor     Review of Systems: General: negative for chills, fever, night sweats or weight changes.  Cardiovascular: negative for edema, orthopnea, palpitations, paroxysmal nocturnal dyspnea or shortness of breath Dermatological: negative for rash Respiratory: negative for cough or wheezing Urologic: negative for hematuria Abdominal: negative for nausea, vomiting, diarrhea, bright red blood per rectum, melena, or hematemesis Neurologic: negative for visual changes All other systems reviewed and are otherwise negative except as noted above.    Blood pressure 130/62, pulse 90, height 5' 8.5" (1.74 m), weight 190 lb 9.6 oz (86.5 kg).  General appearance: alert, cooperative and no distress Neck: no carotid bruit and no JVD Lungs: clear to auscultation bilaterally Heart: regular rate and rhythm Abdomen: non tender, umbilcal hernia Extremities: extremities normal, atraumatic, no cyanosis or edema Skin: Skin color, texture, turgor normal. No rashes or lesions Neurologic: Grossly normal  EKG NSR frequent PVCs and PACs  ASSESSMENT AND PLAN:   Syncope and collapse Seen in ED 3 weeks ago- syncope after he got up to walk down the hall  Chest pain Pt seen as an add on for chest pain (mild CAD at cath May 2018)  Long term (current) use of anticoagulants [Z79.01] On Coumadin INR theraputic  S/P minimally invasive mitral valve repair S/P minimally invasive MV repair May 2018  VERTIGO, POSITIONAL Chronic problem  Frequent PVCs PVCs and PACs on EKG  AML (acute myeloid leukemia) in remission  ([facility]) Treated with chemotherapy- last CBC 07/16/16 was WNL   PLAN  He was not orthostatic on exam- 114/68 laying, 127/72 standing. Check echo to make sure there is nothing wrong with the valve repair or possibly a pericardial effusion (on Coumadin).  Check 7 day Holter to r/o arrhythmia, and check a CBC to r/o significant anemia.   Keep follow up with Dr Hochrein. If the above are negative would refer him back to his PCP or an ENT for treatment of vertigo.   Luke Kilroy PA-C 08/22/2016 11:06 AM 

## 2016-08-22 NOTE — Assessment & Plan Note (Signed)
Pt seen as an add on for chest pain (mild CAD at cath May 2018)

## 2016-08-22 NOTE — Assessment & Plan Note (Signed)
On Coumadin INR theraputic

## 2016-08-22 NOTE — Telephone Encounter (Signed)
-----   Message from Erlene Quan, [place] sent at 08/22/2016  4:22 PM EDT ----- Regarding: RE: cardiac rehab  I would wait till after he has his echo and monitor.  Thanks  Kerin Ransom PA-C 08/22/2016 4:23 PM ----- Message ----- From: Lowell Guitar, RN Sent: 08/22/2016   3:44 PM To: Erlene Quan, PA-C Subject: cardiac rehab                                  Dear Lurena Joiner,  Pt scheduled to begin cardiac rehab orientation 08/24/16.  You saw him in office today to evaluate syncopal episode, SSCP and dyspnea.  Is he cleared to begin cardiac rehab or would it be better to wait for echo and holter results?  Thank you, Andi Hence, RN, BSN Cardiac Pulmonary Rehab

## 2016-08-22 NOTE — Assessment & Plan Note (Signed)
Seen in ED 3 weeks ago- syncope after he got up to walk down the hall

## 2016-08-22 NOTE — Patient Instructions (Addendum)
Medication Instructions: No changes  Labwork: Your physician recommends that you have the following lab work today: CBC   Procedures/Testing: Your physician has requested that you have an echocardiogram. Echocardiography is a painless test that uses sound waves to create images of your heart. It provides your doctor with information about the size and shape of your heart and how well your heart's chambers and valves are working. This procedure takes approximately one hour. There are no restrictions for this procedure. This will be done at [place] has recommended that you wear an 7 day event monitor. Event monitors are medical devices that record the heart's electrical activity. Doctors most often [place] these monitors to diagnose arrhythmias. Arrhythmias are problems with the speed or rhythm of the heartbeat. The monitor is a small, portable device. You can wear one while you do your normal daily activities. This is usually used to diagnose what is causing palpitations/syncope (passing out). This will be done at [facility]. [address]  Please follow up with Dr. Percival Spanish after the echo and monitor have been done.   Any Additional Special Instructions Will Be Listed Below (If Applicable).     If you need a refill on your cardiac medications before your next appointment, please call your pharmacy.

## 2016-08-22 NOTE — Telephone Encounter (Signed)
pc to pt to discuss postponing cardiac rehab orientation until results from holter and echo ordered at 08/22/16 [facility] appt obtained.  LMOM

## 2016-08-22 NOTE — Assessment & Plan Note (Signed)
Chronic problem. 

## 2016-08-22 NOTE — Assessment & Plan Note (Signed)
Treated with chemotherapy- last CBC 07/16/16 was WNL

## 2016-08-22 NOTE — Telephone Encounter (Signed)
Pt informed.  Orientation appointment cancelled.  Pt instructed he will be contacted to r/s once cleared by cardiology.  Understanding verbalized.

## 2016-08-22 NOTE — Assessment & Plan Note (Signed)
PVCs and PACs on EKG

## 2016-08-22 NOTE — Assessment & Plan Note (Signed)
S/P minimally invasive MV repair May 2018

## 2016-08-24 ENCOUNTER — Inpatient Hospital Stay (HOSPITAL_COMMUNITY): Admission: RE | Admit: 2016-08-24 | Payer: Self-pay | Source: Ambulatory Visit

## 2016-08-25 ENCOUNTER — Other Ambulatory Visit: Payer: Self-pay | Admitting: Internal Medicine

## 2016-08-25 ENCOUNTER — Telehealth: Payer: Self-pay | Admitting: Cardiology

## 2016-08-25 NOTE — Telephone Encounter (Signed)
New message     Pt is calling asking for a call back about his labs.

## 2016-08-25 NOTE — Telephone Encounter (Signed)
Patient made aware of results and verbalized his understanding.  Notes recorded by Erlene Quan, PA-C on 08/22/2016 at 4:48 PM EDT Please let the pt know his CBC looked good

## 2016-08-28 ENCOUNTER — Ambulatory Visit (HOSPITAL_COMMUNITY): Payer: Self-pay

## 2016-08-29 ENCOUNTER — Ambulatory Visit (INDEPENDENT_AMBULATORY_CARE_PROVIDER_SITE_OTHER): Payer: 59 | Admitting: Internal Medicine

## 2016-08-29 ENCOUNTER — Encounter: Payer: Self-pay | Admitting: Internal Medicine

## 2016-08-29 VITALS — BP 138/74 | HR 54 | Ht 68.0 in | Wt 190.0 lb

## 2016-08-29 DIAGNOSIS — Z794 Long term (current) use of insulin: Secondary | ICD-10-CM | POA: Diagnosis not present

## 2016-08-29 DIAGNOSIS — E11649 Type 2 diabetes mellitus with hypoglycemia without coma: Secondary | ICD-10-CM | POA: Diagnosis not present

## 2016-08-29 LAB — POCT GLYCOSYLATED HEMOGLOBIN (HGB A1C): Hemoglobin A1C: 6.6

## 2016-08-29 NOTE — Patient Instructions (Addendum)
Please continue: - Basaglar 16 units at bedtime. - Metformin ER 500 mg with dinner.  Please stop twinkies and pop tarts.  Please return in 3 months with your sugar log.

## 2016-08-29 NOTE — Progress Notes (Signed)
Patient ID: David Carr., male   DOB: 1945-03-31, 71 y.o.   MRN: [id number]   HPI: David Carr. is a 71 y.o.-year-old male, returning for f/u for DM2, dx in end of the 1990s, insulin-dependent since ~2014, uncontrolled, without long term complications. Last visit 3.5 mo ago.  Since last visit, patient contacted [place] with tremors: he had an episode of loss of consciousness when sugars reached 60 in 07/17/2016. He decreased his insulin doses then to 20 units daily, and subsequently, on 07/27/2016, to 16 units daily. No lows since then.  Last hemoglobin A1c was: Lab Results  Component Value Date   HGBA1C 6.4 (H) 06/19/2016   HGBA1C 7.0 05/09/2016   HGBA1C 7.5 (H) 01/18/2016   Pt was on a regimen of: - Humalog 75/25 35 >> increased to 45 units at bedtime! - Metformin 500 mg 2x a day, with meals >> now 500 mg at dinnertime  At last visit, we changed to: - Metformin ER 1000 mg with dinner >> constipation >> stopped 2 days ago. - Basaglar 30 >> 20  >> 16 units at bedtime.  Pt checks his sugars 3x a day: - am: 90s-110 >> 100-115 >> 106-134 - 2h after b'fast: n/c >> 117-144, 275 (poptarts) - before lunch: n/c >> 122, 177 (after 275) - 2h after lunch: n/c >> 150-206 - before dinner: n/c >> 102-130 - 2h after dinner: n/c >> 153-189 - bedtime: n/c >> 140-180s >> 148-193 - nighttime: n/c + frequent lows. Lowest sugar was 50 - at 2-3 am >> 103 >> 60 (LOC); he has hypoglycemia awareness - but ? Level.  Highest sugar was 240s >> 265 (after surgery)  Glucometer: Freestyle Lite  Pt's meals are: - Breakfast: coffee + nab or slice pound cake + pop tarts - Lunch: fast food or sandwich or skips - Dinner: meat + veggies  - Snacks: 1, before bedtime He was drinking sodas >> quit.  He walks 2x day: 3/4 mi - with his black lab.  - No h/o CKD, last BUN/creatinine:  Lab Results  Component Value Date   BUN 8 07/16/2016   BUN 15 07/03/2016   CREATININE 0.92 07/16/2016   CREATININE  1.02 07/03/2016   - last set of lipids: Lab Results  Component Value Date   CHOL 130 10/21/2015   HDL 44.70 10/21/2015   LDLCALC 71 10/21/2015   LDLDIRECT 74.4 06/19/2006   TRIG 74.0 10/21/2015   CHOLHDL 3 10/21/2015  On Zocor. - last eye exam was in 2013. No DR. He still did not have another eye exam despite multiple promptings.  - he denies numbness and tingling in his feet.  He has AML in remission  ROS: Constitutional: no weight gain/no weight loss, no fatigue, no subjective hyperthermia, no subjective hypothermia Eyes: no blurry vision, no xerophthalmia ENT: no sore throat, no nodules palpated in throat, no dysphagia, no odynophagia, no hoarseness Cardiovascular: no CP/+ SOB/no palpitations/no leg swelling Respiratory: no cough/+ SOB/no wheezing Gastrointestinal: no N/no V/no D/no C/no acid reflux Musculoskeletal: no muscle aches/no joint aches Skin: no rashes, no hair loss Neurological: no tremors/no numbness/no tingling/no dizziness  I reviewed pt's medications, allergies, PMH, social hx, family hx, and changes were documented in the history of present illness. Otherwise, unchanged from my initial visit note.  Past Medical History:  Diagnosis Date  . AML (acute myeloid leukemia) in remission [facility]) oncologist-  dr Alvy Bimler-- per last note 10/ 2017 in remission 2 years   dx 09-11-2013  via  bone marrow bx , FLT3 negative (NP M1 +)/  chemotherapy started 09-17-2013,  remission via marrow bx 11-06-2013,  4 cycles consolidation chemo w/ HiDAC 11-13-2013 to 03-05-2014  . BPH with urinary obstruction   . Chronic thrombocytopenic purpura ([place])   . Dental caries    periodontitis  . Dyspnea   . GERD (gastroesophageal reflux disease)   . Heart murmur   . History of kidney stones   . Hyperlipidemia   . Hypertension   . MVP (mitral valve prolapse)   . Pancytopenia, acquired ([place])   . Right ureteral stone   . 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   Complex  valvuloplasty including triangular resection of flail segment of posterior leaflet, artificial Gore-tex neochord placement x6 and 48m Sorin Memo 3D ring annuloplasty via right mini thoracotomy approach  . Severe mitral regurgitation   . Type 2 diabetes mellitus with hypoglycemia, with long-term current use of insulin ([facility]    endocrinologist-  dr gCon Memos . Wears denture    upper   Past Surgical History:  Procedure Laterality Date  . CARDIAC CATHETERIZATION    . CARDIOVASCULAR STRESS TEST  07/23/2013   Low risk nuclear study w/ mild inferior ischemia/  normal LV function and wall motion, ef 69%  . CYSTOSCOPY WITH RETROGRADE PYELOGRAM, URETEROSCOPY AND STENT PLACEMENT Right 04/20/2016   Procedure: CYSTOSCOPY WITH RETROGRADE PYELOGRAM, URETEROSCOPY , STONE BASKETRY AND STENT PLACEMENT;  Surgeon: SFranchot Gallo MD;  [facility]  Service: Urology;  Laterality: Right;  . EXTRACORPOREAL SHOCK WAVE LITHOTRIPSY  yrs ago  . HOLMIUM LASER APPLICATION Right 33/06/91  Procedure: HOLMIUM LASER APPLICATION;  Surgeon: SFranchot Gallo MD;  [facility]  Service: Urology;  Laterality: Right;  . INGUINAL HERNIA REPAIR Left 1990's  . MITRAL VALVE REPAIR N/A 06/21/2016   Procedure: MINIMALLY INVASIVE MITRAL VALVE REPAIR (MVR) USING SORIN MEMO 3D SIZE 32 SEMIRIGID ANNULOPLASTY RING;  Surgeon: ORexene Alberts MD;  [facility]  Service: Open Heart Surgery;  Laterality: N/A;  . MULTIPLE EXTRACTIONS WITH ALVEOLOPLASTY N/A 06/08/2016   Procedure: MULTIPLE EXTRACTION WITH ALVEOLOPLASTY AND PRE-PROSTHETIC SURGERY AS NEEDED;  Surgeon: RLenn Cal DDS;  [facility]  Service: Oral Surgery;  Laterality: N/A;  . PATENT FORAMEN OVALE(PFO) CLOSURE N/A 06/21/2016   Procedure: PATENT FORAMEN OVALE (PFO) CLOSURE;  Surgeon: ORexene Alberts MD;  [facility] [facility]  Service: Open Heart Surgery;  Laterality: N/A;  . RIGHT/LEFT HEART CATH AND CORONARY ANGIOGRAPHY  N/A 06/07/2016   Procedure: Right/Left Heart Cath and Coronary Angiography;  Surgeon: MSherren Mocha MD;  [facility];  Service: Cardiovascular;  Laterality: N/A;  . ROTATOR CUFF REPAIR Right x2  last one 2011  . TEE WITHOUT CARDIOVERSION N/A 01/11/2016   Procedure: TRANSESOPHAGEAL ECHOCARDIOGRAM (TEE);  Surgeon: KPixie Casino MD;  [facility];  Service: Cardiovascular;  Laterality: N/A; Severe MR associated w/ flail P2 segment of MV;  no LAA thromus;  small PFO by saline microbubble contrast after valsalva; LVEF 55-60%  . TEE WITHOUT CARDIOVERSION N/A 06/21/2016   Procedure: TRANSESOPHAGEAL ECHOCARDIOGRAM (TEE);  Surgeon: ORexene Alberts MD;  [place] [facility]  Service: Open Heart Surgery;  Laterality: N/A;  . TENNIS ELBOW RELEASE/NIRSCHEL PROCEDURE Right 1990's  . TRANSTHORACIC ECHOCARDIOGRAM  12/22/2015   grade 1 diastolic dysfunction,  ef 55-60%/  moderate MVP involving posterior leaflet w/ partial flail and severe MR via CW doppler (peak gradient 3104mg)/  trivial TR   Social  History   Social History  . Marital status: Married    Spouse name: N/A  . Number of children: 2   Occupational History  . accountant   Social History Main Topics  . Smoking status: Former Research scientist (life sciences)  . Smokeless tobacco: Never Used  . Alcohol use No  . Drug use: No   Current Outpatient Prescriptions on File Prior to Visit  Medication Sig Dispense Refill  . acetaminophen (TYLENOL) 500 MG tablet Take 2 tablets (1,000 mg total) by mouth every 6 (six) hours as needed. 30 tablet 0  . aspirin 81 MG tablet Take 81 mg by mouth daily with supper.     Marland Kitchen atenolol (TENORMIN) 25 MG tablet Take 0.5 tablets (12.5 mg total) by mouth daily. 30 tablet 3  . chlorhexidine (PERIDEX) 0.12 % solution Rinse with 15 mls twice daily for 30 seconds. Use after breakfast and at bedtime. Spit out excess. Do not swallow. 480 mL prn  . Insulin Glargine (BASAGLAR KWIKPEN) 100 UNIT/ML SOPN Inject 16 Units into the  skin at bedtime.    . Insulin Glargine (BASAGLAR KWIKPEN) 100 UNIT/ML SOPN INJECT 30 UNITS INTO THE SKIN AT BEDTIME. 9 pen 0  . metFORMIN (GLUCOPHAGE-XR) 500 MG 24 hr tablet Take 1 tablet by mouth daily with supper.    . simvastatin (ZOCOR) 20 MG tablet Take 1 tablet (20 mg total) by mouth at bedtime. 90 tablet 3  . traMADol (ULTRAM) 50 MG tablet Take 1 tablet (50 mg total) by mouth every 4 (four) hours as needed for moderate pain. 30 tablet 0  . warfarin (COUMADIN) 2.5 MG tablet Take 1 and 1/2 to 2 tablet daily as directed by coumadin clinic 60 tablet 3   Current Facility-Administered Medications on File Prior to Visit  Medication Dose Route Frequency Provider Last Rate Last Dose  . heparin lock flush 100 unit/mL  500 Units Intravenous Once Brunetta Genera, MD      . sodium chloride 0.9 % injection 10 mL  10 mL Intravenous PRN Brunetta Genera, MD       He is also on: - Metformin 500 mg daily - Humalog 75/25 45 units at bedtime  Allergies  Allergen Reactions  . Prochlorperazine Other (See Comments)    ARRHYTHMIAS  . Morphine And Related     Shuts bladder down.   Family History  Problem Relation Age of Onset  . Asthma Sister   . Cancer Daughter        carcinoid tumor   PE: Ht '5\' 8"'$  (1.727 m)   Wt 190 lb (86.2 kg)   BMI 28.89 kg/m   Wt Readings from Last 3 Encounters:  08/29/16 190 lb (86.2 kg)  08/22/16 190 lb 9.6 oz (86.5 kg)  07/17/16 192 lb 12.8 oz (87.5 kg)   Constitutional: overweight, in NAD Eyes: PERRLA, EOMI, no exophthalmos ENT: moist mucous membranes, no thyromegaly, no cervical lymphadenopathy Cardiovascular: RRR, No RG, + 1/6 SEM Respiratory: CTA B Gastrointestinal: abdomen soft, NT, ND, BS+ Musculoskeletal: no deformities, strength intact in all 4 Skin: moist, warm, no rashes Neurological: no tremor with outstretched hands, DTR normal in all 4  ASSESSMENT: 1. DM2, insulin-dependent, uncontrolled, without Long term complications, but with  hypoglycemia  PLAN:  1. Patient with long-standing, prev. uncontrolled diabetes, now better controlled on a lower dose of Basaglar (had low CBGs from >16 units). Sugars are mostly at goal but has some spikes 2/2 sweets >> advised to stop. He has pbs chewing foods 2/2 teeth being pulled >>  will have surgery and then dentures. - I suggested to:  Patient Instructions  Please continue: - Basaglar 16 units at bedtime. - Metformin ER 500 mg with dinner.  Please stop twinkies and pop tarts.  Please return in 3 months with your sugar log.   - today, HbA1c is 6.4% (stable) - continue checking sugars at different times of the day - check 2x a day, rotating checks - advised for yearly eye exams >> he needs one! - Return to clinic in 3 mo with sugar log    Philemon Kingdom, MD PhD [facility] Endocrinology

## 2016-08-29 NOTE — Addendum Note (Signed)
Addended by: Caprice Beaver T on: 08/29/2016 03:58 PM   Modules accepted: Orders

## 2016-08-30 ENCOUNTER — Ambulatory Visit (HOSPITAL_COMMUNITY): Payer: Self-pay

## 2016-08-30 ENCOUNTER — Encounter: Payer: Self-pay | Admitting: *Deleted

## 2016-09-01 ENCOUNTER — Ambulatory Visit (HOSPITAL_COMMUNITY): Payer: Self-pay

## 2016-09-04 ENCOUNTER — Ambulatory Visit (HOSPITAL_COMMUNITY): Payer: Self-pay

## 2016-09-05 ENCOUNTER — Ambulatory Visit (INDEPENDENT_AMBULATORY_CARE_PROVIDER_SITE_OTHER): Payer: 59 | Admitting: Pharmacist Clinician (PhC)/ Clinical Pharmacy Specialist

## 2016-09-05 ENCOUNTER — Other Ambulatory Visit: Payer: Self-pay

## 2016-09-05 ENCOUNTER — Ambulatory Visit (HOSPITAL_COMMUNITY): Payer: 59 | Attending: Cardiology

## 2016-09-05 ENCOUNTER — Encounter (INDEPENDENT_AMBULATORY_CARE_PROVIDER_SITE_OTHER): Payer: 59

## 2016-09-05 DIAGNOSIS — I5030 Unspecified diastolic (congestive) heart failure: Secondary | ICD-10-CM | POA: Diagnosis not present

## 2016-09-05 DIAGNOSIS — Z7901 Long term (current) use of anticoagulants: Secondary | ICD-10-CM

## 2016-09-05 DIAGNOSIS — R0600 Dyspnea, unspecified: Secondary | ICD-10-CM

## 2016-09-05 DIAGNOSIS — R55 Syncope and collapse: Secondary | ICD-10-CM

## 2016-09-05 DIAGNOSIS — I069 Rheumatic aortic valve disease, unspecified: Secondary | ICD-10-CM | POA: Insufficient documentation

## 2016-09-05 DIAGNOSIS — I420 Dilated cardiomyopathy: Secondary | ICD-10-CM | POA: Insufficient documentation

## 2016-09-05 DIAGNOSIS — Z9889 Other specified postprocedural states: Secondary | ICD-10-CM | POA: Insufficient documentation

## 2016-09-05 LAB — POCT INR: INR: 1.8

## 2016-09-05 MED ORDER — WARFARIN SODIUM 5 MG PO TABS: 5.0000 mg | ORAL_TABLET | Freq: Every day | ORAL | 0 refills | Status: DC

## 2016-09-06 ENCOUNTER — Encounter: Payer: Self-pay | Admitting: *Deleted

## 2016-09-06 ENCOUNTER — Ambulatory Visit (HOSPITAL_COMMUNITY): Payer: Self-pay

## 2016-09-08 ENCOUNTER — Ambulatory Visit (HOSPITAL_COMMUNITY): Payer: Self-pay

## 2016-09-11 ENCOUNTER — Ambulatory Visit (HOSPITAL_COMMUNITY): Payer: Self-pay

## 2016-09-11 ENCOUNTER — Encounter: Payer: Self-pay | Admitting: Cardiology

## 2016-09-12 ENCOUNTER — Telehealth: Payer: Self-pay | Admitting: Cardiology

## 2016-09-12 NOTE — Telephone Encounter (Signed)
New Message ° ° pt verbalized that he is returning call for rn  °

## 2016-09-12 NOTE — Telephone Encounter (Signed)
F/U Call:  Patient calling, states that he needs a plastic bag and shipping label for his monitor and states he will be charged if he does not send monitor back. I advised patient that I would send message.

## 2016-09-12 NOTE — Telephone Encounter (Signed)
David Carr is calling because his monitor did not have a shipping label or the plastic bag that it was supposed to be shipped back in . Please call

## 2016-09-13 ENCOUNTER — Telehealth: Payer: Self-pay | Admitting: *Deleted

## 2016-09-13 ENCOUNTER — Ambulatory Visit (HOSPITAL_COMMUNITY): Payer: Self-pay

## 2016-09-13 NOTE — Telephone Encounter (Signed)
Patient has called the [employer] to have one of their shipping bags with prepaid postage shipped to his home so he can return his cardiac event.

## 2016-09-14 ENCOUNTER — Telehealth (HOSPITAL_COMMUNITY): Payer: Self-pay | Admitting: *Deleted

## 2016-09-14 NOTE — Telephone Encounter (Signed)
Returned call to pt from message left.  Pt would like to get started in Cardiac Rehab. Advised pt that clearance would be given by Kerin Ransom once event tracings from his cardiac monitor was reviewed. Pt was initial placed on hold to start CR due to syncopal symptoms.  Echo and cardiac monitor ordered. Pt verbalized understanding. Cherre Huger, BSN Cardiac and Training and development officer

## 2016-09-15 ENCOUNTER — Ambulatory Visit (HOSPITAL_COMMUNITY): Payer: Self-pay

## 2016-09-16 ENCOUNTER — Telehealth (HOSPITAL_COMMUNITY): Payer: Self-pay | Admitting: Cardiac Rehabilitation

## 2016-09-16 NOTE — Telephone Encounter (Signed)
-----   Message from Erlene Quan, [place] sent at 09/15/2016  1:20 PM EDT ----- Regarding: RE: cardiac rehab  I reviewed his monitor- no significant arrhythmia. He is OK to resume cardiac rehab from our standpoint.  Kerin Ransom PA-C 09/15/2016 1:21 PM   ----- Message ----- From: Lowell Guitar, RN Sent: 08/22/2016   3:44 PM To: Erlene Quan, PA-C Subject: cardiac rehab                                  Dear Lurena Joiner,  Pt scheduled to begin cardiac rehab orientation 08/24/16.  You saw him in office today to evaluate syncopal episode, SSCP and dyspnea.  Is he cleared to begin cardiac rehab or would it be better to wait for echo and holter results?  Thank you, Andi Hence, RN, BSN Cardiac Pulmonary Rehab

## 2016-09-18 ENCOUNTER — Ambulatory Visit (HOSPITAL_COMMUNITY): Payer: Self-pay

## 2016-09-19 ENCOUNTER — Ambulatory Visit (INDEPENDENT_AMBULATORY_CARE_PROVIDER_SITE_OTHER): Payer: 59 | Admitting: Pharmacist Clinician (PhC)/ Clinical Pharmacy Specialist

## 2016-09-19 ENCOUNTER — Telehealth: Payer: Self-pay | Admitting: Cardiology

## 2016-09-19 DIAGNOSIS — Z9889 Other specified postprocedural states: Secondary | ICD-10-CM | POA: Diagnosis not present

## 2016-09-19 DIAGNOSIS — Z7901 Long term (current) use of anticoagulants: Secondary | ICD-10-CM | POA: Diagnosis not present

## 2016-09-19 LAB — POCT INR: INR: 2.5

## 2016-09-19 NOTE — Telephone Encounter (Signed)
Already spoke to patient echo results given. °

## 2016-09-19 NOTE — Telephone Encounter (Signed)
New Message     Pt called back the message on his recording say to call [place] back at [phone] , he was calling from [phone] if you need to contact Mr Dains back.

## 2016-09-19 NOTE — Telephone Encounter (Signed)
Returned call to patient echo results given.Patient requested his phone # not be put in chart due to receiving multiple calls from robot caller.

## 2016-09-19 NOTE — Telephone Encounter (Signed)
David Carr is returning a call . Thanks

## 2016-09-20 ENCOUNTER — Ambulatory Visit (HOSPITAL_COMMUNITY): Payer: Self-pay

## 2016-09-22 ENCOUNTER — Telehealth (HOSPITAL_COMMUNITY): Payer: Self-pay | Admitting: Pharmacist

## 2016-09-22 ENCOUNTER — Ambulatory Visit (HOSPITAL_COMMUNITY): Payer: Self-pay

## 2016-09-22 NOTE — Telephone Encounter (Signed)
Cardiac Rehab Medication Review by a Pharmacist  Does the patient  feel that his/her medications are working for him/her?  yes  Has the patient been experiencing any side effects to the medications prescribed?  no  Does the patient measure his/her own blood pressure or blood glucose at home?  yes   BG typically 100-130s in the AM, 130-160s during the day. Rarely > 200.  Does the patient have any problems obtaining medications due to transportation or finances?   no  Understanding of regimen: good Understanding of indications: good Potential of compliance: good   Pharmacist comments: 43 yoM presents for cardiac rehab orientation. He keeps a list of his medications and was able to provide me a complete list. His only complaint is that he wishes all of his medications were filled at the same time and does not like having to go to the pharmacy multiple times to pick up prescriptions.   Mila Merry Gerarda Fraction, PharmD PGY1 Pharmacy Resident Pager: [phone]  09/22/2016 12:18 PM

## 2016-09-25 ENCOUNTER — Ambulatory Visit (HOSPITAL_COMMUNITY): Payer: Self-pay

## 2016-09-27 ENCOUNTER — Ambulatory Visit (HOSPITAL_COMMUNITY): Payer: Self-pay

## 2016-09-27 ENCOUNTER — Telehealth (HOSPITAL_COMMUNITY): Payer: Self-pay

## 2016-09-27 NOTE — Telephone Encounter (Signed)
*  Updated insurance benefits* [employer] - no co-payment, deductible $1000/$1000 has been met, out of pocket $3000/$3000 has been met, no co-insurance and no pre-authorization. Passport/reference [id number].

## 2016-09-28 ENCOUNTER — Encounter (HOSPITAL_COMMUNITY): Payer: Self-pay

## 2016-09-28 ENCOUNTER — Encounter (HOSPITAL_COMMUNITY)
Admission: RE | Admit: 2016-09-28 | Discharge: 2016-09-28 | Disposition: A | Discharge status: Home / Self Care | Payer: Medicare Other | Source: Ambulatory Visit | Attending: Cardiology | Admitting: Cardiology

## 2016-09-28 VITALS — BP 108/70 | HR 96 | Ht 68.0 in | Wt 192.9 lb

## 2016-09-28 DIAGNOSIS — Z9889 Other specified postprocedural states: Secondary | ICD-10-CM | POA: Diagnosis present

## 2016-09-28 NOTE — Progress Notes (Signed)
Cardiac Individual Treatment Plan  Patient Details  Name: David Carr. MRN: [id number] Date of Birth: 04/13/1945 Referring Provider:     CARDIAC REHAB PHASE II ORIENTATION from 09/28/2016 in [place]  Referring Provider  Marijo File MD      Initial Encounter Date:    CARDIAC REHAB PHASE II ORIENTATION from 09/28/2016 in [facility]  Date  09/28/16  Referring Provider  Marijo File MD      Visit Diagnosis: 06/21/16 S/P mitral valve repair  Patient's Home Medications on Admission:  Current Outpatient Prescriptions:  .  acetaminophen (TYLENOL) 500 MG tablet, Take 2 tablets (1,000 mg total) by mouth every 6 (six) hours as needed. (Patient not taking: Reported on 09/22/2016), Disp: 30 tablet, Rfl: 0 .  aspirin 81 MG tablet, Take 81 mg by mouth daily with supper. , Disp: , Rfl:  .  atenolol (TENORMIN) 25 MG tablet, Take 0.5 tablets (12.5 mg total) by mouth daily., Disp: 30 tablet, Rfl: 3 .  chlorhexidine (PERIDEX) 0.12 % solution, Rinse with 15 mls twice daily for 30 seconds. Use after breakfast and at bedtime. Spit out excess. Do not swallow., Disp: 480 mL, Rfl: prn .  Insulin Glargine (BASAGLAR KWIKPEN) 100 UNIT/ML SOPN, Inject 16 Units into the skin at bedtime., Disp: , Rfl:  .  metFORMIN (GLUCOPHAGE-XR) 500 MG 24 hr tablet, Take 1 tablet by mouth daily with supper., Disp: , Rfl:  .  simvastatin (ZOCOR) 20 MG tablet, Take 1 tablet (20 mg total) by mouth at bedtime., Disp: 90 tablet, Rfl: 3 .  traMADol (ULTRAM) 50 MG tablet, Take 1 tablet (50 mg total) by mouth every 4 (four) hours as needed for moderate pain. (Patient not taking: Reported on 09/22/2016), Disp: 30 tablet, Rfl: 0 .  warfarin (COUMADIN) 5 MG tablet, Take 1 tablet (5 mg total) by mouth daily., Disp: 90 tablet, Rfl: 0 No current facility-administered medications for this encounter.   Facility-Administered Medications Ordered in Other Encounters:  .   heparin lock flush 100 unit/mL, 500 Units, Intravenous, Once, Kale, Cloria Spring, MD .  sodium chloride 0.9 % injection 10 mL, 10 mL, Intravenous, PRN, Irene Limbo Cloria Spring, MD  Past Medical History: Past Medical History:  Diagnosis Date  . AML (acute myeloid leukemia) in remission [facility]) oncologist-  dr Alvy Bimler-- per last note 10/ 2017 in remission 2 years   dx 09-11-2013  via bone marrow bx , FLT3 negative (NP M1 +)/  chemotherapy started 09-17-2013,  remission via marrow bx 11-06-2013,  4 cycles consolidation chemo w/ HiDAC 11-13-2013 to 03-05-2014  . BPH with urinary obstruction   . Chronic thrombocytopenic purpura ([place])   . Dental caries    periodontitis  . Dyspnea   . GERD (gastroesophageal reflux disease)   . Heart murmur   . History of kidney stones   . Hyperlipidemia   . Hypertension   . MVP (mitral valve prolapse)   . Pancytopenia, acquired ([place])   . Right ureteral stone   . 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 Severe mitral regurgitation   . Type 2 diabetes mellitus with hypoglycemia, with long-term current use of insulin ([facility]    endocrinologist-  dr gCon Memos . Wears denture    upper    Tobacco Use: History  Smoking Status  . Former Smoker  . Years: 20.00  . Types: Cigarettes  . Quit date: 04/18/1974  Smokeless Tobacco  . Never Used    Labs: Recent Review Flowsheet Data    Labs for ITP Cardiac and Pulmonary Rehab Latest Ref Rng & Units 06/21/2016 06/21/2016 06/21/2016 06/22/2016 08/29/2016   Cholestrol 0 - 200 mg/dL - - - - -   LDLCALC 0 - 99 mg/dL - - - - -   LDLDIRECT mg/dL - - - - -   HDL >39.00 mg/dL - - - - -   Trlycerides 0.0 - 149.0 mg/dL - - - - -   Hemoglobin A1c - - - - - 6.6   PHART 7.350 - 7.450 7.463(H) - 7.404 - -    PCO2ART 32.0 - 48.0 mmHg 35.0 - 40.9 - -   HCO3 20.0 - 28.0 mmol/L 25.0 - 25.4 - -   TCO2 0 - 100 mmol/L '26 25 27 21 '$ -   O2SAT % 95.0 - 98.0 63.3 -      Capillary Blood Glucose: Lab Results  Component Value Date   GLUCAP 136 (H) 06/26/2016   GLUCAP 107 (H) 06/26/2016   GLUCAP 94 06/25/2016   GLUCAP 140 (H) 06/25/2016   GLUCAP 182 (H) 06/25/2016     Exercise Target Goals: Date: 09/28/16  Exercise Program Goal: Individual exercise prescription set with THRR, safety & activity barriers. Participant demonstrates ability to understand and report RPE using BORG scale, to self-measure pulse accurately, and to acknowledge the importance of the exercise prescription.  Exercise Prescription Goal: Starting with aerobic activity 30 plus minutes a day, 3 days per week for initial exercise prescription. Provide home exercise prescription and guidelines that participant acknowledges understanding prior to discharge.  Activity Barriers & Risk Stratification:     Activity Barriers & Cardiac Risk Stratification - 09/28/16 0852      Activity Barriers & Cardiac Risk Stratification   Activity Barriers Muscular Weakness;Deconditioning;Other (comment)   Comments R RTCx2 8 yrs ago; R elbow surgery and hernia surgery 20 years ago   Cardiac Risk Stratification High      6 Minute Walk:     6 Minute Walk    Row Name 09/28/16 1038         6 Minute Walk   Phase Initial     Distance 1948 feet     Walk Time 6 minutes     # of Rest Breaks 0     MPH 3.67     METS 3.92     RPE 13     VO2 Peak 13.73     Symptoms No     Resting HR 96 bpm     Resting BP 108/70     Max Ex. HR 108 bpm     Max Ex. BP 140/80     2 Minute Post BP 120/84        Oxygen Initial Assessment:   Oxygen Re-Evaluation:   Oxygen Discharge (Final Oxygen Re-Evaluation):   Initial Exercise Prescription:     Initial Exercise Prescription - 09/28/16 1000      Date of Initial Exercise RX and Referring Provider    Date 09/28/16   Referring Provider Marijo File MD     Treadmill   MPH 3   Grade 1   Minutes 10   METs 3.71     Bike   Level 1   Minutes 10   METs 3.22     NuStep   Level  3   SPM 80   Minutes 10   METs 2.5     Prescription Details   Frequency (times per week) 3   Duration Progress to 30 minutes of continuous aerobic without signs/symptoms of physical distress     Intensity   THRR 40-80% of Max Heartrate 59-118   Ratings of Perceived Exertion 11-13   Perceived Dyspnea 0-4     Progression   Progression Continue to progress workloads to maintain intensity without signs/symptoms of physical distress.     Resistance Training   Training Prescription Yes   Weight 3lbs   Reps 10-15      Perform Capillary Blood Glucose checks as needed.  Exercise Prescription Changes:   Exercise Comments:   Exercise Goals and Review:     Exercise Goals    Row Name 09/28/16 0857             Exercise Goals   Increase Physical Activity Yes       Intervention Provide advice, education, support and counseling about physical activity/exercise needs.;Develop an individualized exercise prescription for aerobic and resistive training based on initial evaluation findings, risk stratification, comorbidities and participant's personal goals.       Expected Outcomes Achievement of increased cardiorespiratory fitness and enhanced flexibility, muscular endurance and strength shown through measurements of functional capacity and personal statement of participant.       Increase Strength and Stamina Yes       Intervention Provide advice, education, support and counseling about physical activity/exercise needs.;Develop an individualized exercise prescription for aerobic and resistive training based on initial evaluation findings, risk stratification, comorbidities and participant's personal goals.       Expected Outcomes Achievement of increased cardiorespiratory fitness and enhanced  flexibility, muscular endurance and strength shown through measurements of functional capacity and personal statement of participant.          Exercise Goals Re-Evaluation :    Discharge Exercise Prescription (Final Exercise Prescription Changes):   Nutrition:  Target Goals: Understanding of nutrition guidelines, daily intake of sodium '1500mg'$ , cholesterol '200mg'$ , calories 30% from fat and 7% or less from saturated fats, daily to have 5 or more servings of fruits and vegetables.  Biometrics:     Pre Biometrics - 09/28/16 1526      Pre Biometrics   Height '5\' 8"'$  (1.727 m)   Weight 192 lb 14.4 oz (87.5 kg)   Waist Circumference 43 inches   Hip Circumference 39.5 inches   Waist to Hip Ratio 1.09 %   BMI (Calculated) 29.4   Triceps Skinfold 22 mm   % Body Fat 31.3 %   Grip Strength 39 kg   Flexibility 9 in   Single Leg Stand 30 seconds       Nutrition Therapy Plan and Nutrition Goals:   Nutrition Discharge: Nutrition Scores:   Nutrition Goals Re-Evaluation:   Nutrition Goals Re-Evaluation:   Nutrition Goals Discharge (Final Nutrition Goals Re-Evaluation):   Psychosocial: Target Goals: Acknowledge presence or absence of significant depression and/or stress, maximize coping skills, provide positive support system. Participant is able to verbalize types and ability to use techniques and skills needed for reducing stress and depression.  Initial Review & Psychosocial Screening:     Initial Psych Review & Screening - 09/28/16 1523      Initial Review   Current issues with None Identified     Family Dynamics   Good Support System? Yes     Barriers   Psychosocial barriers to participate in program  There are no identifiable barriers or psychosocial needs.     Screening Interventions   Interventions Encouraged to exercise      Quality of Life Scores:     Quality of Life - 09/28/16 1034      Quality of Life Scores   Health/Function Pre 23.17 %    Socioeconomic Pre 25 %   Psych/Spiritual Pre 21.71 %   Family Pre 27 %   GLOBAL Pre 23.81 %      PHQ-9: Recent Review Flowsheet Data    Depression screen [facility] 2/9 10/26/2015 10/20/2014   Decreased Interest 0 0   Down, Depressed, Hopeless 0 0   PHQ - 2 Score 0 0     Interpretation of Total Score  Total Score Depression Severity:  1-4 = Minimal depression, 5-9 = Mild depression, 10-14 = Moderate depression, 15-19 = Moderately severe depression, 20-27 = Severe depression   Psychosocial Evaluation and Intervention:   Psychosocial Re-Evaluation:   Psychosocial Discharge (Final Psychosocial Re-Evaluation):   Vocational Rehabilitation: Provide vocational rehab assistance to qualifying candidates.   Vocational Rehab Evaluation & Intervention:     Vocational Rehab - 09/28/16 1522      Initial Vocational Rehab Evaluation & Intervention   Assessment shows need for Vocational Rehabilitation No      Education: Education Goals: Education classes will be provided on a weekly basis, covering required topics. Participant will state understanding/return demonstration of topics presented.  Learning Barriers/Preferences:     Learning Barriers/Preferences - 09/28/16 0851      Learning Barriers/Preferences   Learning Barriers Sight   Learning Preferences Video;Skilled Demonstration;Pictoral      Education Topics: Count Your Pulse:  -Group instruction provided by verbal instruction, demonstration, patient participation and written materials to support subject.  Instructors address importance of being able to find your pulse and how to count your pulse when at home without a heart monitor.  Patients get hands on experience counting their pulse with staff help and individually.   Heart Attack, Angina, and Risk Factor Modification:  -Group instruction provided by verbal instruction, video, and written materials to support subject.  Instructors address signs and symptoms of angina and  heart attacks.    Also discuss risk factors for heart disease and how to make changes to improve heart health risk factors.   Functional Fitness:  -Group instruction provided by verbal instruction, demonstration, patient participation, and written materials to support subject.  Instructors address safety measures for doing things around the house.  Discuss how to get up and down off the floor, how to pick things up properly, how to safely get out of a chair without assistance, and balance training.   Meditation and Mindfulness:  -Group instruction provided by verbal instruction, patient participation, and written materials to support subject.  Instructor addresses importance of mindfulness and meditation practice to help reduce stress and improve awareness.  Instructor also leads participants through a meditation exercise.    Stretching for Flexibility and Mobility:  -Group instruction provided by verbal instruction, patient participation, and written materials to support subject.  Instructors lead participants through series of stretches that are designed to increase flexibility thus improving mobility.  These stretches are additional exercise for major muscle groups that are typically performed during regular warm up and cool down.   Hands Only CPR:  -Group verbal, video, and participation provides a basic overview of AHA guidelines for community CPR. Role-play of emergencies allow participants the opportunity to practice calling for help and chest compression technique  with discussion of AED use.   Hypertension: -Group verbal and written instruction that provides a basic overview of hypertension including the most recent diagnostic guidelines, risk factor reduction with self-care instructions and medication management.    Nutrition I class: Heart Healthy Eating:  -Group instruction provided by PowerPoint slides, verbal discussion, and written materials to support subject matter. The  instructor gives an explanation and review of the Therapeutic Lifestyle Changes diet recommendations, which includes a discussion on lipid goals, dietary fat, sodium, fiber, plant stanol/sterol esters, sugar, and the components of a well-balanced, healthy diet.   Nutrition II class: Lifestyle Skills:  -Group instruction provided by PowerPoint slides, verbal discussion, and written materials to support subject matter. The instructor gives an explanation and review of label reading, grocery shopping for heart health, heart healthy recipe modifications, and ways to make healthier choices when eating out.   Diabetes Question & Answer:  -Group instruction provided by PowerPoint slides, verbal discussion, and written materials to support subject matter. The instructor gives an explanation and review of diabetes co-morbidities, pre- and post-prandial blood glucose goals, pre-exercise blood glucose goals, signs, symptoms, and treatment of hypoglycemia and hyperglycemia, and foot care basics.   Diabetes Blitz:  -Group instruction provided by PowerPoint slides, verbal discussion, and written materials to support subject matter. The instructor gives an explanation and review of the physiology behind type 1 and type 2 diabetes, diabetes medications and rational behind using different medications, pre- and post-prandial blood glucose recommendations and Hemoglobin A1c goals, diabetes diet, and exercise including blood glucose guidelines for exercising safely.    Portion Distortion:  -Group instruction provided by PowerPoint slides, verbal discussion, written materials, and food models to support subject matter. The instructor gives an explanation of serving size versus portion size, changes in portions sizes over the last 20 years, and what consists of a serving from each food group.   Stress Management:  -Group instruction provided by verbal instruction, video, and written materials to support subject  matter.  Instructors review role of stress in heart disease and how to cope with stress positively.     Exercising on Your Own:  -Group instruction provided by verbal instruction, power point, and written materials to support subject.  Instructors discuss benefits of exercise, components of exercise, frequency and intensity of exercise, and end points for exercise.  Also discuss use of nitroglycerin and activating EMS.  Review options of places to exercise outside of rehab.  Review guidelines for sex with heart disease.   Cardiac Drugs I:  -Group instruction provided by verbal instruction and written materials to support subject.  Instructor reviews cardiac drug classes: antiplatelets, anticoagulants, beta blockers, and statins.  Instructor discusses reasons, side effects, and lifestyle considerations for each drug class.   Cardiac Drugs II:  -Group instruction provided by verbal instruction and written materials to support subject.  Instructor reviews cardiac drug classes: angiotensin converting enzyme inhibitors (ACE-I), angiotensin II receptor blockers (ARBs), nitrates, and calcium channel blockers.  Instructor discusses reasons, side effects, and lifestyle considerations for each drug class.   Anatomy and Physiology of the Circulatory System:  Group verbal and written instruction and models provide basic cardiac anatomy and physiology, with the coronary electrical and arterial systems. Review of: AMI, Angina, Valve disease, Heart Failure, Peripheral Artery Disease, Cardiac Arrhythmia, Pacemakers, and the ICD.   Other Education:  -Group or individual verbal, written, or video instructions that support the educational goals of the cardiac rehab program.   Knowledge Questionnaire Score:  Knowledge Questionnaire Score - 09/28/16 1031      Knowledge Questionnaire Score   Pre Score 17/24      Core Components/Risk Factors/Patient Goals at Admission:     Personal Goals and Risk  Factors at Admission - 09/28/16 1035      Core Components/Risk Factors/Patient Goals on Admission    Weight Management Yes;Weight Maintenance;Weight Loss   Intervention Weight Management: Develop a combined nutrition and exercise program designed to reach desired caloric intake, while maintaining appropriate intake of nutrient and fiber, sodium and fats, and appropriate energy expenditure required for the weight goal.;Weight Management: Provide education and appropriate resources to help participant work on and attain dietary goals.;Weight Management/Obesity: Establish reasonable short term and long term weight goals.   Expected Outcomes Short Term: Continue to assess and modify interventions until short term weight is achieved;Long Term: Adherence to nutrition and physical activity/exercise program aimed toward attainment of established weight goal;Weight Maintenance: Understanding of the daily nutrition guidelines, which includes 25-35% calories from fat, 7% or less cal from saturated fats, less than '200mg'$  cholesterol, less than 1.5gm of sodium, & 5 or more servings of fruits and vegetables daily;Weight Loss: Understanding of general recommendations for a balanced deficit meal plan, which promotes 1-2 lb weight loss per week and includes a negative energy balance of [phone] kcal/d;Understanding recommendations for meals to include 15-35% energy as protein, 25-35% energy from fat, 35-60% energy from carbohydrates, less than '200mg'$  of dietary cholesterol, 20-35 gm of total fiber daily;Understanding of distribution of calorie intake throughout the day with the consumption of 4-5 meals/snacks   Diabetes Yes   Intervention Provide education about signs/symptoms and action to take for hypo/hyperglycemia.;Provide education about proper nutrition, including hydration, and aerobic/resistive exercise prescription along with prescribed medications to achieve blood glucose in normal ranges: Fasting glucose 65-99 mg/dL    Expected Outcomes Long Term: Attainment of HbA1C < 7%.;Short Term: Participant verbalizes understanding of the signs/symptoms and immediate care of hyper/hypoglycemia, proper foot care and importance of medication, aerobic/resistive exercise and nutrition plan for blood glucose control.   Hypertension Yes   Intervention Provide education on lifestyle modifcations including regular physical activity/exercise, weight management, moderate sodium restriction and increased consumption of fresh fruit, vegetables, and low fat dairy, alcohol moderation, and smoking cessation.;Monitor prescription use compliance.   Expected Outcomes Short Term: Continued assessment and intervention until BP is < 140/28m HG in hypertensive participants. < 130/836mHG in hypertensive participants with diabetes, heart failure or chronic kidney disease.;Long Term: Maintenance of blood pressure at goal levels.   Lipids Yes   Intervention Provide education and support for participant on nutrition & aerobic/resistive exercise along with prescribed medications to achieve LDL '70mg'$ , HDL >'40mg'$ .   Expected Outcomes Short Term: Participant states understanding of desired cholesterol values and is compliant with medications prescribed. Participant is following exercise prescription and nutrition guidelines.;Long Term: Cholesterol controlled with medications as prescribed, with individualized exercise RX and with personalized nutrition plan. Value goals: LDL < '70mg'$ , HDL > 40 mg.   Stress Yes   Intervention Offer individual and/or small group education and counseling on adjustment to heart disease, stress management and health-related lifestyle change. Teach and support self-help strategies.;Refer participants experiencing significant psychosocial distress to appropriate mental health specialists for further evaluation and treatment. When possible, include family members and significant others in education/counseling sessions.   Expected  Outcomes Short Term: Participant demonstrates changes in health-related behavior, relaxation and other stress management skills, ability to obtain effective social support, and compliance with psychotropic medications  if prescribed.;Long Term: Emotional wellbeing is indicated by absence of clinically significant psychosocial distress or social isolation.      Core Components/Risk Factors/Patient Goals Review:    Core Components/Risk Factors/Patient Goals at Discharge (Final Review):    ITP Comments:     ITP Comments    Row Name 09/28/16 0846           ITP Comments Dr. Fransico Him, Medical Director          Comments: Thayer Jew attended orientation from 0800 to 1000 to review rules and guidelines for program. Completed 6 minute walk test, Intitial ITP, and exercise prescription.  VSS. Telemetry-Sinus Rhythm with occasional PAC.  Asymptomatic.Barnet Pall, RN,BSN 09/28/2016 3:31 PM

## 2016-09-29 ENCOUNTER — Ambulatory Visit (HOSPITAL_COMMUNITY): Payer: Self-pay

## 2016-09-29 ENCOUNTER — Other Ambulatory Visit: Payer: Self-pay | Admitting: Physician Assistant

## 2016-10-01 NOTE — Progress Notes (Signed)
HPI The patient presents for followup of MR.  He is status post minimally invasive MV repair with ring annuloplasty and placement of Neo Chord on 06/30/16.  He had a recent syncopal spell and came to the ED.  He did have low BS.  he had no other events.  On follow up in our office he had an event monitor placed.  This demonstrated on siginficant arrhythmias.     Since then he has done very well and has had no further syncope.  He started cardiac rehab.  The patient denies any new symptoms such as chest discomfort, neck or arm discomfort. There has been no new shortness of breath, PND or orthopnea. There have been no reported palpitations, presyncope or syncope.   Allergies  Allergen Reactions  . Prochlorperazine Other (See Comments)    ARRHYTHMIAS  . Morphine And Related     Shuts bladder down.    Current Outpatient Prescriptions  Medication Sig Dispense Refill  . acetaminophen (TYLENOL) 500 MG tablet Take 2 tablets (1,000 mg total) by mouth every 6 (six) hours as needed. 30 tablet 0  . aspirin 81 MG tablet Take 81 mg by mouth daily with supper.     Marland Kitchen atenolol (TENORMIN) 25 MG tablet Take 0.5 tablets (12.5 mg total) by mouth daily. 30 tablet 3  . chlorhexidine (PERIDEX) 0.12 % solution Rinse with 15 mls twice daily for 30 seconds. Use after breakfast and at bedtime. Spit out excess. Do not swallow. 480 mL prn  . Insulin Glargine (BASAGLAR KWIKPEN) 100 UNIT/ML SOPN Inject 16 Units into the skin at bedtime.    . metFORMIN (GLUCOPHAGE-XR) 500 MG 24 hr tablet Take 1 tablet by mouth daily with supper.    . simvastatin (ZOCOR) 20 MG tablet Take 1 tablet (20 mg total) by mouth at bedtime. 90 tablet 3  . warfarin (COUMADIN) 5 MG tablet Take 1 tablet (5 mg total) by mouth daily. 90 tablet 0   No current facility-administered medications for this visit.    Facility-Administered Medications Ordered in Other Visits  Medication Dose Route Frequency Provider Last Rate Last Dose  . heparin lock  flush 100 unit/mL  500 Units Intravenous Once Brunetta Genera, MD      . sodium chloride 0.9 % injection 10 mL  10 mL Intravenous PRN Brunetta Genera, MD        Past Medical History:  Diagnosis Date  . AML (acute myeloid leukemia) in remission [facility]) oncologist-  dr Alvy Bimler-- per last note 10/ 2017 in remission 2 years   dx 09-11-2013  via bone marrow bx , FLT3 negative (NP M1 +)/  chemotherapy started 09-17-2013,  remission via marrow bx 11-06-2013,  4 cycles consolidation chemo w/ HiDAC 11-13-2013 to 03-05-2014  . BPH with urinary obstruction   . Chronic thrombocytopenic purpura ([place])   . Dental caries    periodontitis  . GERD (gastroesophageal reflux disease)   . Heart murmur   . History of kidney stones   . Hyperlipidemia   . Hypertension   . MVP (mitral valve prolapse)   . Pancytopenia, acquired ([place])   . Right ureteral stone   . 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   Complex valvuloplasty including triangular resection of flail segment of posterior leaflet, artificial Gore-tex neochord placement x6 and 57m Sorin Memo 3D ring annuloplasty via right mini thoracotomy approach  . Severe mitral regurgitation   . Type 2 diabetes mellitus with hypoglycemia, with long-term current use of insulin (  [facility])    endocrinologist-  dr Con Memos  . Wears denture    upper    Past Surgical History:  Procedure Laterality Date  . CARDIAC CATHETERIZATION    . CARDIOVASCULAR STRESS TEST  07/23/2013   Low risk nuclear study w/ mild inferior ischemia/  normal LV function and wall motion, ef 69%  . CYSTOSCOPY WITH RETROGRADE PYELOGRAM, URETEROSCOPY AND STENT PLACEMENT Right 04/20/2016   Procedure: CYSTOSCOPY WITH RETROGRADE PYELOGRAM, URETEROSCOPY , STONE BASKETRY AND STENT PLACEMENT;  Surgeon: Franchot Gallo, MD;  Location: [facility];  Service: Urology;  Laterality: Right;  . EXTRACORPOREAL SHOCK WAVE LITHOTRIPSY  yrs ago  . HOLMIUM LASER APPLICATION Right  08/15/2200   Procedure: HOLMIUM LASER APPLICATION;  Surgeon: Franchot Gallo, MD;  Location: [facility];  Service: Urology;  Laterality: Right;  . INGUINAL HERNIA REPAIR Left 1990's  . MITRAL VALVE REPAIR N/A 06/21/2016   Procedure: MINIMALLY INVASIVE MITRAL VALVE REPAIR (MVR) USING SORIN MEMO 3D SIZE 32 SEMIRIGID ANNULOPLASTY RING;  Surgeon: Rexene Alberts, MD;  Location: [place];  Service: Open Heart Surgery;  Laterality: N/A;  . MULTIPLE EXTRACTIONS WITH ALVEOLOPLASTY N/A 06/08/2016   Procedure: MULTIPLE EXTRACTION WITH ALVEOLOPLASTY AND PRE-PROSTHETIC SURGERY AS NEEDED;  Surgeon: Lenn Cal, DDS;  Location: [place];  Service: Oral Surgery;  Laterality: N/A;  . PATENT FORAMEN OVALE(PFO) CLOSURE N/A 06/21/2016   Procedure: PATENT FORAMEN OVALE (PFO) CLOSURE;  Surgeon: Rexene Alberts, MD;  Location: [place];  Service: Open Heart Surgery;  Laterality: N/A;  . RIGHT/LEFT HEART CATH AND CORONARY ANGIOGRAPHY N/A 06/07/2016   Procedure: Right/Left Heart Cath and Coronary Angiography;  Surgeon: Sherren Mocha, MD;  Location: [place] [facility];  Service: Cardiovascular;  Laterality: N/A;  . ROTATOR CUFF REPAIR Right x2  last one 2011  . TEE WITHOUT CARDIOVERSION N/A 01/11/2016   Procedure: TRANSESOPHAGEAL ECHOCARDIOGRAM (TEE);  Surgeon: Pixie Casino, MD;  Location: [facility];  Service: Cardiovascular;  Laterality: N/A; Severe MR associated w/ flail P2 segment of MV;  no LAA thromus;  small PFO by saline microbubble contrast after valsalva; LVEF 55-60%  . TEE WITHOUT CARDIOVERSION N/A 06/21/2016   Procedure: TRANSESOPHAGEAL ECHOCARDIOGRAM (TEE);  Surgeon: Rexene Alberts, MD;  Location: [place];  Service: Open Heart Surgery;  Laterality: N/A;  . TENNIS ELBOW RELEASE/NIRSCHEL PROCEDURE Right 1990's  . TRANSTHORACIC ECHOCARDIOGRAM  12/22/2015   grade 1 diastolic dysfunction,  ef 55-60%/  moderate MVP involving posterior leaflet w/ partial flail and severe MR via CW doppler (peak gradient  40mHg)/  trivial TR    ROS: As stated in the HPI and negative for all other systems.  PHYSICAL EXAM BP 118/76   Pulse 96   Ht _0  (1.727 m)   Wt 194 lb 3.2 oz (88.1 kg)   BMI 29.53 kg/m   GENERAL:  Well appearing HEENT:  Pupils equal round and reactive, fundi not visualized, oral mucosa unremarkable NECK:  No jugular venous distention, waveform within normal limits, carotid upstroke brisk and symmetric, no bruits, no thyromegaly LYMPHATICS:  No cervical, inguinal adenopathy LUNGS:  Clear to auscultation bilaterally BACK:  No CVA tenderness CHEST:  Well healed left thoracotomy scars.  HEART:  PMI not displaced or sustained,S1 and S2 within normal limits, no S3, no S4, no clicks, no rubs, no murmurs ABD:  Flat, positive bowel sounds normal in frequency in pitch, no bruits, no rebound, no guarding, no midline pulsatile mass, no hepatomegaly, no splenomegaly EXT:  2 plus pulses throughout, no  edema, no cyanosis no clubbing    ASSESSMENT AND PLAN  SYNCOPE -  He has had no further episodes .  No further work up is suggested.    MITRAL REGURGITATION -  He is status post repair.  He is doing well.  He can stop the warfarin.    HYPERTENSION - The blood pressure is at target.  He will continue with current therapy.    DYSLIPIDEMIA - He will continue with meds as listed.   Lab Results  Component Value Date   CHOL 130 10/21/2015   TRIG 74.0 10/21/2015   HDL 44.70 10/21/2015   LDLCALC 71 10/21/2015   LDLDIRECT 74.4 06/19/2006

## 2016-10-02 ENCOUNTER — Ambulatory Visit (HOSPITAL_COMMUNITY): Payer: Self-pay

## 2016-10-02 ENCOUNTER — Encounter (HOSPITAL_COMMUNITY)
Admission: RE | Admit: 2016-10-02 | Discharge: 2016-10-02 | Disposition: A | Discharge status: Home / Self Care | Payer: Medicare Other | Source: Ambulatory Visit | Attending: Cardiology | Admitting: Cardiology

## 2016-10-02 DIAGNOSIS — Z9889 Other specified postprocedural states: Secondary | ICD-10-CM | POA: Diagnosis not present

## 2016-10-02 LAB — GLUCOSE, CAPILLARY
GLUCOSE-CAPILLARY: 170 mg/dL — AB (ref 65–99)
GLUCOSE-CAPILLARY: 153 mg/dL — AB (ref 65–99)

## 2016-10-02 NOTE — Progress Notes (Signed)
Daily Session Note  Patient Details  Name: David Carr. MRN: [id number] Date of Birth: 1945/06/14 Referring Provider:     CARDIAC REHAB PHASE II ORIENTATION from 09/28/2016 in [place]  Referring Provider  Marijo File MD      Encounter Date: 10/02/2016  Check In:     Session Check In - 10/02/16 1015      Check-In   Location [facility]   Staff Present Maurice Small, RN, BSN;Joann Rion, RN, Marga Melnick, RN, Deland Pretty, MS, ACSM CEP, Exercise Physiologist   Supervising physician immediately available to respond to emergencies [facility] immediately available   Physician(s) Dr. Wendee Beavers   Medication changes reported     No   Fall or balance concerns reported    No   Tobacco Cessation No Change   Warm-up and Cool-down Performed as group-led instruction   Resistance Training Performed Yes   VAD Patient? No     Pain Assessment   Currently in Pain? No/denies      Capillary Blood Glucose: Results for orders placed or performed during the hospital encounter of 10/02/16 (from the past 24 hour(s))  Glucose, capillary     Status: Abnormal   Collection Time: 10/02/16  9:54 AM  Result Value Ref Range   Glucose-Capillary 170 (H) 65 - 99 mg/dL  Glucose, capillary     Status: Abnormal   Collection Time: 10/02/16 10:34 AM  Result Value Ref Range   Glucose-Capillary 153 (H) 65 - 99 mg/dL      History  Smoking Status  . Former Smoker  . Years: 20.00  . Types: Cigarettes  . Quit date: 04/18/1974  Smokeless Tobacco  . Never Used    Goals Met:  Exercise tolerated well  Goals Unmet:  Not Applicable  Comments: [name] started cardiac rehab today.  Pt tolerated light exercise without difficulty. VSS, telemetry-Sinus Rhythm, asymptomatic.  Medication list reconciled. Pt denies barriers to medicaiton compliance.  PSYCHOSOCIAL ASSESSMENT:  PHQ-0. Pt exhibits positive coping skills, hopeful outlook with  supportive family. No psychosocial needs identified at this time, no psychosocial interventions necessary.    Pt enjoys walking his dog.   Pt oriented to exercise equipment and routine.    Understanding verbalized.Barnet Pall, RN,BSN 10/02/2016 10:44 AM   Dr. Fransico Him is Medical Director for Cardiac Rehab at [facility].

## 2016-10-03 ENCOUNTER — Ambulatory Visit (INDEPENDENT_AMBULATORY_CARE_PROVIDER_SITE_OTHER): Payer: 59 | Admitting: Cardiology

## 2016-10-03 ENCOUNTER — Ambulatory Visit (INDEPENDENT_AMBULATORY_CARE_PROVIDER_SITE_OTHER): Payer: 59 | Admitting: Pharmacist

## 2016-10-03 ENCOUNTER — Encounter: Payer: Self-pay | Admitting: Cardiology

## 2016-10-03 VITALS — BP 118/76 | HR 96 | Ht 68.0 in | Wt 194.2 lb

## 2016-10-03 DIAGNOSIS — Z7901 Long term (current) use of anticoagulants: Secondary | ICD-10-CM

## 2016-10-03 DIAGNOSIS — I10 Essential (primary) hypertension: Secondary | ICD-10-CM | POA: Diagnosis not present

## 2016-10-03 DIAGNOSIS — Z9889 Other specified postprocedural states: Secondary | ICD-10-CM

## 2016-10-03 DIAGNOSIS — E785 Hyperlipidemia, unspecified: Secondary | ICD-10-CM | POA: Diagnosis not present

## 2016-10-03 LAB — POCT INR: INR: 2.7

## 2016-10-03 NOTE — Patient Instructions (Signed)
Medication Instructions:  Continue current medications  If you need a refill on your cardiac medications before your next appointment, please call your pharmacy.  Labwork: None Ordered HERE IN OUR OFFICE AT [facility]  Testing/Procedures: None Ordered  Follow-Up: Your physician wants you to follow-up in: 6 Months. You should receive a reminder letter in the mail two months in advance. If you do not receive a letter, please call our office [phone].      Thank you for choosing [facility]!!      

## 2016-10-04 ENCOUNTER — Ambulatory Visit (HOSPITAL_COMMUNITY): Payer: Self-pay

## 2016-10-04 ENCOUNTER — Encounter (HOSPITAL_COMMUNITY)
Admission: RE | Admit: 2016-10-04 | Discharge: 2016-10-04 | Disposition: A | Discharge status: Home / Self Care | Payer: Medicare Other | Source: Ambulatory Visit | Attending: Cardiology | Admitting: Cardiology

## 2016-10-04 DIAGNOSIS — Z9889 Other specified postprocedural states: Secondary | ICD-10-CM

## 2016-10-04 LAB — GLUCOSE, CAPILLARY
GLUCOSE-CAPILLARY: 174 mg/dL — AB (ref 65–99)
Glucose-Capillary: 117 mg/dL — ABNORMAL HIGH (ref 65–99)

## 2016-10-04 NOTE — Progress Notes (Signed)
David Carr. 71 y.o. male DOB [birth year] MRN [id number]       Nutrition: Brief Note  1. 06/21/16 S/P mitral valve repair    Past Medical History:  Diagnosis Date  . AML (acute myeloid leukemia) in remission [facility]) oncologist-  dr Alvy Bimler-- per last note 10/ 2017 in remission 2 years   dx 09-11-2013  via bone marrow bx , FLT3 negative (NP M1 +)/  chemotherapy started 09-17-2013,  remission via marrow bx 11-06-2013,  4 cycles consolidation chemo w/ HiDAC 11-13-2013 to 03-05-2014  . BPH with urinary obstruction   . Chronic thrombocytopenic purpura ([place])   . Dental caries    periodontitis  . GERD (gastroesophageal reflux disease)   . Heart murmur   . History of kidney stones   . Hyperlipidemia   . Hypertension   . MVP (mitral valve prolapse)   . Pancytopenia, acquired ([place])   . Right ureteral stone   . 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   Complex valvuloplasty including triangular resection of flail segment of posterior leaflet, artificial Gore-tex neochord placement x6 and 4m Sorin Memo 3D ring annuloplasty via right mini thoracotomy approach  . Severe mitral regurgitation   . Type 2 diabetes mellitus with hypoglycemia, with long-term current use of insulin ([facility]    endocrinologist-  dr gCon Memos . Wears denture    upper   Meds reviewed. Metformin, Glargine, Coumadin noted  HT: Ht Readings from Last 1 Encounters:  10/03/16 '5\' 8"'$  (1.727 m)    WT: Wt Readings from Last 3 Encounters:  10/03/16 194 lb 3.2 oz (88.1 kg)  09/28/16 192 lb 14.4 oz (87.5 kg)  08/29/16 190 lb (86.2 kg)     BMI 29.4   Current tobacco use? No  Labs:  Lipid Panel     Component Value Date/Time   CHOL 130 10/21/2015 0759   TRIG 74.0 10/21/2015 0759   HDL 44.70 10/21/2015 0759   CHOLHDL 3 10/21/2015 0759   VLDL 14.8 10/21/2015 0759   LDLCALC 71 10/21/2015 0759   LDLDIRECT 74.4 06/19/2006 0956    Lab Results  Component Value Date   HGBA1C 6.6 08/29/2016   CBG (last 3)    Recent Labs  10/02/16 0954 10/02/16 1034 10/04/16 0955  GLUCAP 170* 153* 174*    Nutrition Diagnosis ? Food-and nutrition-related knowledge deficit related to lack of exposure to information as related to diagnosis of: ? CVD ? DM  ? Overweight related to excessive energy intake as evidenced by a BMI of 29.4  Nutrition Goal(s):  ? Pt to identify and limit food sources of saturated fat, trans fat, and sodium ? Pt to identify food quantities necessary to achieve weight loss of 6-20 lb at graduation from cardiac rehab. Goal wt of 175 lb desired.  ? Pt able to name foods rich in vitamin K. (Pt taking Coumadin/Warfarin).  Plan:  Pt to attend nutrition classes ? Nutrition I ? Nutrition II ? Portion Distortion ? Diabetes Blitz ? Diabetes Q & A Will provide client-centered nutrition education as part of interdisciplinary care.   Monitor and evaluate progress toward nutrition goal with team.  EDerek Mound M.Ed, RD, LDN, CDE 10/04/2016 10:41 AM

## 2016-10-05 ENCOUNTER — Telehealth: Payer: Self-pay | Admitting: *Deleted

## 2016-10-05 NOTE — Telephone Encounter (Signed)
-----   Message from Minus Breeding, MD sent at 10/04/2016  2:27 PM EDT ----- OK to stop warfarin.  Please call and cancel warfarin clinic follow up.

## 2016-10-05 NOTE — Telephone Encounter (Signed)
Spoke with pt letting him know to stop warfarin as per Dr Percival Spanish.  appt for coumadin clinic cancel

## 2016-10-06 ENCOUNTER — Encounter (HOSPITAL_COMMUNITY)
Admission: RE | Admit: 2016-10-06 | Discharge: 2016-10-06 | Disposition: A | Discharge status: Home / Self Care | Payer: Medicare Other | Source: Ambulatory Visit | Attending: Cardiology | Admitting: Cardiology

## 2016-10-06 ENCOUNTER — Ambulatory Visit (HOSPITAL_COMMUNITY): Payer: Self-pay

## 2016-10-06 DIAGNOSIS — Z9889 Other specified postprocedural states: Secondary | ICD-10-CM | POA: Diagnosis not present

## 2016-10-06 LAB — GLUCOSE, CAPILLARY
Glucose-Capillary: 160 mg/dL — ABNORMAL HIGH (ref 65–99)
Glucose-Capillary: 134 mg/dL — ABNORMAL HIGH (ref 65–99)

## 2016-10-09 ENCOUNTER — Ambulatory Visit (HOSPITAL_COMMUNITY): Payer: Self-pay

## 2016-10-09 ENCOUNTER — Encounter (HOSPITAL_COMMUNITY)
Admission: RE | Admit: 2016-10-09 | Discharge: 2016-10-09 | Disposition: A | Discharge status: Home / Self Care | Payer: Medicare Other | Source: Ambulatory Visit | Attending: Cardiology | Admitting: Cardiology

## 2016-10-09 DIAGNOSIS — Z9889 Other specified postprocedural states: Secondary | ICD-10-CM

## 2016-10-11 ENCOUNTER — Encounter (HOSPITAL_COMMUNITY)
Admission: RE | Admit: 2016-10-11 | Discharge: 2016-10-11 | Disposition: A | Discharge status: Home / Self Care | Payer: Medicare Other | Source: Ambulatory Visit | Attending: Cardiology | Admitting: Cardiology

## 2016-10-11 ENCOUNTER — Ambulatory Visit (HOSPITAL_COMMUNITY): Payer: Self-pay

## 2016-10-11 DIAGNOSIS — Z9889 Other specified postprocedural states: Secondary | ICD-10-CM

## 2016-10-13 ENCOUNTER — Ambulatory Visit (HOSPITAL_COMMUNITY): Payer: Self-pay

## 2016-10-13 ENCOUNTER — Encounter (HOSPITAL_COMMUNITY)
Admission: RE | Admit: 2016-10-13 | Discharge: 2016-10-13 | Disposition: A | Discharge status: Home / Self Care | Payer: Medicare Other | Source: Ambulatory Visit | Attending: Cardiology | Admitting: Cardiology

## 2016-10-13 DIAGNOSIS — Z9889 Other specified postprocedural states: Secondary | ICD-10-CM | POA: Diagnosis not present

## 2016-10-18 ENCOUNTER — Ambulatory Visit (HOSPITAL_COMMUNITY): Payer: Self-pay

## 2016-10-18 ENCOUNTER — Encounter (HOSPITAL_COMMUNITY)
Admission: RE | Admit: 2016-10-18 | Discharge: 2016-10-18 | Disposition: A | Discharge status: Home / Self Care | Payer: 59 | Source: Ambulatory Visit | Attending: Cardiology | Admitting: Cardiology

## 2016-10-18 DIAGNOSIS — Z9889 Other specified postprocedural states: Secondary | ICD-10-CM | POA: Diagnosis present

## 2016-10-18 NOTE — Progress Notes (Signed)
Reviewed home exercise guidelines with patient including endpoints, temperature precautions, target heart rate and rate of perceived exertion. Pt is walking 1.5 miles (30 minutes) daily as his mode of home exercise. Pt voices understanding of instructions given. Sol Passer, MS, ACSM CEP

## 2016-10-20 ENCOUNTER — Encounter (HOSPITAL_COMMUNITY)
Admission: RE | Admit: 2016-10-20 | Discharge: 2016-10-20 | Disposition: A | Discharge status: Home / Self Care | Payer: 59 | Source: Ambulatory Visit | Attending: Cardiology | Admitting: Cardiology

## 2016-10-20 ENCOUNTER — Ambulatory Visit (HOSPITAL_COMMUNITY): Payer: Self-pay

## 2016-10-20 DIAGNOSIS — Z9889 Other specified postprocedural states: Secondary | ICD-10-CM | POA: Diagnosis not present

## 2016-10-23 ENCOUNTER — Ambulatory Visit (HOSPITAL_COMMUNITY): Payer: Self-pay

## 2016-10-23 ENCOUNTER — Encounter: Payer: Self-pay | Admitting: Thoracic Surgery (Cardiothoracic Vascular Surgery)

## 2016-10-23 ENCOUNTER — Ambulatory Visit (INDEPENDENT_AMBULATORY_CARE_PROVIDER_SITE_OTHER): Payer: 59 | Admitting: Thoracic Surgery (Cardiothoracic Vascular Surgery)

## 2016-10-23 ENCOUNTER — Encounter (HOSPITAL_COMMUNITY)
Admission: RE | Admit: 2016-10-23 | Discharge: 2016-10-23 | Disposition: A | Discharge status: Home / Self Care | Payer: 59 | Source: Ambulatory Visit | Attending: Cardiology | Admitting: Cardiology

## 2016-10-23 VITALS — BP 114/82 | HR 88 | Resp 20 | Ht 68.0 in | Wt 194.0 lb

## 2016-10-23 DIAGNOSIS — Z9889 Other specified postprocedural states: Secondary | ICD-10-CM | POA: Diagnosis not present

## 2016-10-23 DIAGNOSIS — I080 Rheumatic disorders of both mitral and aortic valves: Secondary | ICD-10-CM | POA: Diagnosis not present

## 2016-10-23 DIAGNOSIS — I059 Rheumatic mitral valve disease, unspecified: Secondary | ICD-10-CM | POA: Diagnosis not present

## 2016-10-23 NOTE — Progress Notes (Signed)
[name]     CARDIOTHORACIC SURGERY OFFICE NOTE  Referring Provider is Minus Breeding, MD PCP is Dorena Cookey, MD   HPI:  Patient is a 71 year old male with history of mitral valve prolapse, hypertension, type 2 diabetes mellitus, acute myeloid leukemia in remission, hyperlipidemia, and kidney stones who returns to the office today for routine follow-up status post minimally invasive mitral valve repair on 06/21/2016 for severe symptomatic primary mitral regurgitation. The patient's early postoperative recovery was uneventful and he was discharged home on the fifth postoperative day.  He suffered a syncopal episode on 07/16/2016 for which she was evaluated in the emergency department. He was notably severely hypoglycemic at the time. No other cause for syncope was identified.  He was seen in follow-up in our office the following day and was doing well. Since then he has been seen in follow-up by Dr. Percival Spanish, most recently on 10/03/2016 at which time he was doing very well.  Coumadin was stopped at that time. Routine follow-up echocardiogram revealed normal left ventricular systolic function was intact mitral valve repair and no residual mitral regurgitation. He returns to our office for routine follow-up today.  He reports that he is doing very well. He has been participating in the outpatient cardiac rehabilitation program and making excellent progress. He reports that his breathing and exercise tolerance is notably much better than it was prior to surgery. Overall he feels well.  Current Outpatient Prescriptions  Medication Sig Dispense Refill  . acetaminophen (TYLENOL) 500 MG tablet Take 2 tablets (1,000 mg total) by mouth every 6 (six) hours as needed. 30 tablet 0  . aspirin 81 MG tablet Take 81 mg by mouth daily with supper.     Marland Kitchen atenolol (TENORMIN) 25 MG tablet Take 0.5 tablets (12.5 mg total) by mouth  daily. 30 tablet 3  . chlorhexidine (PERIDEX) 0.12 % solution Rinse with 15 mls twice daily for 30 seconds. Use after breakfast and at bedtime. Spit out excess. Do not swallow. 480 mL prn  . Insulin Glargine (BASAGLAR KWIKPEN) 100 UNIT/ML SOPN Inject 16 Units into the skin at bedtime.    . metFORMIN (GLUCOPHAGE-XR) 500 MG 24 hr tablet Take 1 tablet by mouth daily with supper.    . simvastatin (ZOCOR) 20 MG tablet Take 1 tablet (20 mg total) by mouth at bedtime. 90 tablet 3   No current facility-administered medications for this visit.    Facility-Administered Medications Ordered in Other Visits  Medication Dose Route Frequency Provider Last Rate Last Dose  . heparin lock flush 100 unit/mL  500 Units Intravenous Once Brunetta Genera, MD      . sodium chloride 0.9 % injection 10 mL  10 mL Intravenous PRN Brunetta Genera, MD          Physical Exam:   BP 114/82   Pulse 88   Resp 20   Ht 5\' 8"  (1.727 m)   Wt 194 lb (88 kg)   SpO2 97% Comment: RA  BMI 29.50 kg/m   General:  Well-appearing  Chest:   Clear to auscultation  CV:   Regular rate and rhythm without murmur  Incisions:  Completely healed  Abdomen:  Soft nontender  Extremities:  Warm and well-perfused  Diagnostic Tests:  Transthoracic Echocardiography  Patient:    David Carr, David Carr MR #:  [id number] Study Date: 09/05/2016 Gender:     M Age:        74 Height:     172.7 cm Weight:     86.2 kg BSA:        2.05 m^2 Pt. Status: Room:   ATTENDING    Fransico Him, MD  [facility]  REFERRING    Kerin Ransom K  SONOGRAPHER  Cindy Hazy, RDCS  PERFORMING   [facility], Outpatient  cc:  ------------------------------------------------------------------- LV EF: 55% -   60%  ------------------------------------------------------------------- Indications:      R06.00 Dyspnea.  ------------------------------------------------------------------- History:   PMH:  Acquired from the  patient and from the patient&'s chart.  PMH:  Murmur. Dyspnea. History of Mitral Valve Prolapse.   ------------------------------------------------------------------- Study Conclusions  - Left ventricle: The cavity size was normal. There was severe   focal basal and mild concentric hypertrophy. Systolic function   was normal. The estimated ejection fraction was in the range of   55% to 60%. Wall motion was normal; there were no regional wall   motion abnormalities. There was an increased relative   contribution of atrial contraction to ventricular filling.   Doppler parameters are consistent with abnormal left ventricular   relaxation (grade 1 diastolic dysfunction). Doppler parameters   are consistent with high ventricular filling pressure. - Aortic valve: Trileaflet; normal thickness, mildly calcified   leaflets. - Aorta: Aortic root dimension: 38 mm (ED). - Aortic root: The aortic root was mildly dilated. - Mitral valve: S/P Mitral Valve repair. Mean gradient (D): 4 mm   Hg. Valve area by pressure half-time: 1.82 cm^2. - Atrial septum: There was increased thickness of the septum,   consistent with lipomatous hypertrophy. - Pulmonary arteries: Systolic pressure could not be accurately   estimated.  ------------------------------------------------------------------- Labs, prior tests, procedures, and surgery: Status post Mitral Valve Repair.  ------------------------------------------------------------------- Study data:   Study status:  Routine.  Procedure:  The patient reported no pain pre or post test. Transthoracic echocardiography for left ventricular function evaluation, for right ventricular function evaluation, and for assessment of valvular function. Image quality was adequate.  Study completion:  There were no complications.          Transthoracic echocardiography.  M-mode, complete 2D, spectral Doppler, and color Doppler.  Birthdate: Patient birthdate: 11-Nov-1945.   Age:  Patient is 72 yr old.  Sex: Gender: male.    BMI: 28.9 kg/m^2.  Blood pressure:     138/74 Patient status:  Outpatient.  Study date:  Study date: 09/05/2016. Study time: 11:34 AM.  Location:  [facility]  -------------------------------------------------------------------  ------------------------------------------------------------------- Left ventricle:  The cavity size was normal. There was severe focal basal and mild concentric hypertrophy. Systolic function was normal. The estimated ejection fraction was in the range of 55% to 60%. Wall motion was normal; there were no regional wall motion abnormalities. There was an increased relative contribution of atrial contraction to ventricular filling. Doppler parameters are consistent with abnormal left ventricular relaxation (grade 1 diastolic dysfunction). Doppler parameters are consistent with high ventricular filling pressure.  ------------------------------------------------------------------- Aortic valve:   Trileaflet; normal thickness, mildly calcified leaflets. Mobility was not restricted.  Doppler:  Transvalvular velocity was within the normal range. There was no stenosis. There was no regurgitation.  ------------------------------------------------------------------- Aorta:  Aortic root: The aortic root was mildly dilated.  ------------------------------------------------------------------- Mitral valve:  S/P Mitral Valve repair. Leaflet separation was normal. Mobility was not restricted.  Doppler:  Transvalvular velocity was  within the normal range. There was no evidence for stenosis. There was no regurgitation.    Valve area by pressure half-time: 1.82 cm^2. Indexed valve area by pressure half-time: 0.89 cm^2/m^2. Valve area by continuity equation (using LVOT flow): 1.86 cm^2. Indexed valve area by continuity equation (using LVOT flow): 0.91 cm^2/m^2.    Mean gradient (D): 4 mm Hg. Peak gradient (D): 4  mm Hg.  ------------------------------------------------------------------- Left atrium:  The atrium was normal in size.  ------------------------------------------------------------------- Atrial septum:  There was increased thickness of the septum, consistent with lipomatous hypertrophy.  ------------------------------------------------------------------- Right ventricle:  The cavity size was normal. Wall thickness was normal. Systolic function was normal.  ------------------------------------------------------------------- Pulmonic valve:    Structurally normal valve.   Cusp separation was normal.  Doppler:  Transvalvular velocity was within the normal range. There was no evidence for stenosis. There was no regurgitation.  ------------------------------------------------------------------- Tricuspid valve:   Structurally normal valve.    Doppler: Transvalvular velocity was within the normal range. There was no regurgitation.  ------------------------------------------------------------------- Pulmonary artery:   The main pulmonary artery was normal-sized. Systolic pressure could not be accurately estimated.  ------------------------------------------------------------------- Right atrium:  The atrium was normal in size.  ------------------------------------------------------------------- Pericardium:  There was no pericardial effusion.  ------------------------------------------------------------------- Systemic veins: Inferior vena cava: The vessel was normal in size.  ------------------------------------------------------------------- Measurements   Left ventricle                            Value          Reference  LV ID, ED, PLAX chordal           (L)     40.5  mm       43 - 52  LV ID, ES, PLAX chordal                   30    mm       23 - 38  LV fx shortening, PLAX chordal    (L)     26    %        >=29  LV PW thickness, ED                       11.3   mm       ---------  IVS/LV PW ratio, ED               (H)     1.31           <=1.3  Stroke volume, 2D                         58    ml       ---------  Stroke volume/bsa, 2D                     28    ml/m^2   ---------  LV e&', lateral                            5.87  cm/s     ---------  LV E/e&', lateral                          16.06          ---------  LV e&', medial  4.81  cm/s     ---------  LV E/e&', medial                           19.6           ---------  LV e&', average                            5.34  cm/s     ---------  LV E/e&', average                          17.66          ---------    Ventricular septum                        Value          Reference  IVS thickness, ED                         14.8  mm       ---------    LVOT                                      Value          Reference  LVOT ID, S                                24    mm       ---------  LVOT area                                 4.52  cm^2     ---------  LVOT ID                                   24    mm       ---------  LVOT peak velocity, S                     65.3  cm/s     ---------  LVOT mean velocity, S                     50.5  cm/s     ---------  LVOT VTI, S                               12.9  cm       ---------  LVOT peak gradient, S                     2     mm Hg    ---------  Stroke volume (SV), LVOT DP               58.4  ml       ---------  Stroke index (SV/bsa), LVOT DP            28.4  ml/m^2   ---------    Aorta  Value          Reference  Aortic root ID, ED                        38    mm       ---------  Ascending aorta ID, A-P, S                33    mm       ---------    Left atrium                               Value          Reference  LA ID, A-P, ES                            35    mm       ---------  LA ID/bsa, A-P                            1.7   cm/m^2   <=2.2  LA volume, S                              49    ml        ---------  LA volume/bsa, S                          23.8  ml/m^2   ---------  LA volume, ES, 1-p A4C                    55    ml       ---------  LA volume/bsa, ES, 1-p A4C                26.8  ml/m^2   ---------  LA volume, ES, 1-p A2C                    41    ml       ---------  LA volume/bsa, ES, 1-p A2C                20    ml/m^2   ---------    Mitral valve                              Value          Reference  Mitral E-wave peak velocity               94.3  cm/s     ---------  Mitral A-wave peak velocity               130   cm/s     ---------  Mitral mean velocity, D                   90.8  cm/s     ---------  Mitral deceleration time          (H)     296   ms       150 - 230  Mitral pressure half-time  110   ms       ---------  Mitral mean gradient, D                   4     mm Hg    ---------  Mitral peak gradient, D                   4     mm Hg    ---------  Mitral E/A ratio, peak                    0.7            ---------  Mitral valve area, PHT, DP                1.82  cm^2     ---------  Mitral valve area/bsa, PHT, DP            0.89  cm^2/m^2 ---------  Mitral valve area, LVOT                   1.86  cm^2     ---------  continuity  Mitral valve area/bsa, LVOT               0.91  cm^2/m^2 ---------  continuity  Mitral annulus VTI, D                     31.3  cm       ---------    Right ventricle                           Value          Reference  RV s&', lateral, S                         8.39  cm/s     ---------  Legend: (L)  and  (H)  mark values outside specified reference range.  ------------------------------------------------------------------- Prepared and Electronically Authenticated by  Fransico Him, MD 2018-07-24T14:23:44  Impression:  Patient is doing well more than 4 months status post minimally invasive mitral valve repair  Plan:  We have not recommended any changes to the patient's current medications.  The patient has been  reminded regarding the importance of dental hygiene and the lifelong need for antibiotic prophylaxis for all dental cleanings and other related invasive procedures.  The patient will return to our office for routine follow-up next May, approximately 1 year following his surgery. During the interim period of time he will call and return only should specific problems or questions arise.  I spent in excess of 15 minutes during the conduct of this office consultation and >50% of this time involved direct face-to-face encounter with the patient for counseling and/or coordination of their care.    Valentina Gu. Roxy Manns, MD 10/23/2016 3:05 PM

## 2016-10-23 NOTE — Patient Instructions (Signed)

## 2016-10-25 ENCOUNTER — Ambulatory Visit (HOSPITAL_COMMUNITY): Payer: Self-pay

## 2016-10-25 ENCOUNTER — Encounter (HOSPITAL_COMMUNITY)
Admission: RE | Admit: 2016-10-25 | Discharge: 2016-10-25 | Disposition: A | Discharge status: Home / Self Care | Payer: 59 | Source: Ambulatory Visit | Attending: Cardiology | Admitting: Cardiology

## 2016-10-25 ENCOUNTER — Encounter (HOSPITAL_COMMUNITY): Payer: 59

## 2016-10-25 DIAGNOSIS — Z9889 Other specified postprocedural states: Secondary | ICD-10-CM

## 2016-10-25 LAB — GLUCOSE, CAPILLARY: GLUCOSE-CAPILLARY: 127 mg/dL — AB (ref 65–99)

## 2016-10-26 NOTE — Progress Notes (Signed)
Cardiac Individual Treatment Plan  Patient Details  Name: David Carr. MRN: [id number] Date of Birth: May 15, 1945 Referring Provider:     CARDIAC REHAB PHASE II ORIENTATION from 09/28/2016 in [place]  Referring Provider  Marijo File MD      Initial Encounter Date:    CARDIAC REHAB PHASE II ORIENTATION from 09/28/2016 in [place]  Date  09/28/16  Referring Provider  Marijo File MD      Visit Diagnosis: 06/21/16 S/P mitral valve repair  Patient's Home Medications on Admission:  Current Outpatient Prescriptions:  .  acetaminophen (TYLENOL) 500 MG tablet, Take 2 tablets (1,000 mg total) by mouth every 6 (six) hours as needed., Disp: 30 tablet, Rfl: 0 .  aspirin 81 MG tablet, Take 81 mg by mouth daily with supper. , Disp: , Rfl:  .  atenolol (TENORMIN) 25 MG tablet, Take 0.5 tablets (12.5 mg total) by mouth daily., Disp: 30 tablet, Rfl: 3 .  chlorhexidine (PERIDEX) 0.12 % solution, Rinse with 15 mls twice daily for 30 seconds. Use after breakfast and at bedtime. Spit out excess. Do not swallow., Disp: 480 mL, Rfl: prn .  Insulin Glargine (BASAGLAR KWIKPEN) 100 UNIT/ML SOPN, Inject 16 Units into the skin at bedtime., Disp: , Rfl:  .  metFORMIN (GLUCOPHAGE-XR) 500 MG 24 hr tablet, Take 1 tablet by mouth daily with supper., Disp: , Rfl:  .  simvastatin (ZOCOR) 20 MG tablet, Take 1 tablet (20 mg total) by mouth at bedtime., Disp: 90 tablet, Rfl: 3 No current facility-administered medications for this encounter.   Facility-Administered Medications Ordered in Other Encounters:  .  heparin lock flush 100 unit/mL, 500 Units, Intravenous, Once, Kale, Cloria Spring, MD .  sodium chloride 0.9 % injection 10 mL, 10 mL, Intravenous, PRN, Irene Limbo Cloria Spring, MD  Past Medical History: Past Medical History:  Diagnosis Date  . AML (acute myeloid leukemia) in remission [facility]) oncologist-  dr Alvy Bimler-- per last note 10/  2017 in remission 2 years   dx 09-11-2013  via bone marrow bx , FLT3 negative (NP M1 +)/  chemotherapy started 09-17-2013,  remission via marrow bx 11-06-2013,  4 cycles consolidation chemo w/ HiDAC 11-13-2013 to 03-05-2014  . BPH with urinary obstruction   . Chronic thrombocytopenic purpura ([place])   . Dental caries    periodontitis  . GERD (gastroesophageal reflux disease)   . Heart murmur   . History of kidney stones   . Hyperlipidemia   . Hypertension   . MVP (mitral valve prolapse)   . Pancytopenia, acquired ([place])   . Right ureteral stone   . 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 Severe mitral regurgitation   . Type 2 diabetes mellitus with hypoglycemia, with long-term current use of insulin ([facility]    endocrinologist-  dr gCon Memos . Wears denture    upper    Tobacco Use: History  Smoking Status  . Former Smoker  . Years: 20.00  . Types: Cigarettes  . Quit date: 04/18/1974  Smokeless Tobacco  . Never Used    Labs: Recent Review Flowsheet Data    Labs for ITP Cardiac and Pulmonary Rehab Latest Ref Rng & Units 06/21/2016 06/21/2016 06/21/2016 06/22/2016 08/29/2016   Cholestrol 0 - 200 mg/dL - - - - -  LDLCALC 0 - 99 mg/dL - - - - -   LDLDIRECT mg/dL - - - - -   HDL >39.00 mg/dL - - - - -   Trlycerides 0.0 - 149.0 mg/dL - - - - -   Hemoglobin A1c - - - - - 6.6   PHART 7.350 - 7.450 7.463(H) - 7.404 - -   PCO2ART 32.0 - 48.0 mmHg 35.0 - 40.9 - -   HCO3 20.0 - 28.0 mmol/L 25.0 - 25.4 - -   TCO2 0 - 100 mmol/L '26 25 27 21 '$ -   O2SAT % 95.0 - 98.0 63.3 -      Capillary Blood Glucose: Lab Results  Component Value Date   GLUCAP 127 (H) 10/25/2016   GLUCAP 134 (H) 10/06/2016   GLUCAP 160 (H) 10/06/2016   GLUCAP 117 (H) 10/04/2016   GLUCAP 174 (H)  10/04/2016     Exercise Target Goals:    Exercise Program Goal: Individual exercise prescription set with THRR, safety & activity barriers. Participant demonstrates ability to understand and report RPE using BORG scale, to self-measure pulse accurately, and to acknowledge the importance of the exercise prescription.  Exercise Prescription Goal: Starting with aerobic activity 30 plus minutes a day, 3 days per week for initial exercise prescription. Provide home exercise prescription and guidelines that participant acknowledges understanding prior to discharge.  Activity Barriers & Risk Stratification:     Activity Barriers & Cardiac Risk Stratification - 09/28/16 0852      Activity Barriers & Cardiac Risk Stratification   Activity Barriers Muscular Weakness;Deconditioning;Other (comment)   Comments R RTCx2 8 yrs ago; R elbow surgery and hernia surgery 20 years ago   Cardiac Risk Stratification High      6 Minute Walk:     6 Minute Walk    Row Name 09/28/16 1038         6 Minute Walk   Phase Initial     Distance 1948 feet     Walk Time 6 minutes     # of Rest Breaks 0     MPH 3.67     METS 3.92     RPE 13     VO2 Peak 13.73     Symptoms No     Resting HR 96 bpm     Resting BP 108/70     Max Ex. HR 108 bpm     Max Ex. BP 140/80     2 Minute Post BP 120/84        Oxygen Initial Assessment:   Oxygen Re-Evaluation:   Oxygen Discharge (Final Oxygen Re-Evaluation):   Initial Exercise Prescription:     Initial Exercise Prescription - 09/28/16 1000      Date of Initial Exercise RX and Referring Provider   Date 09/28/16   Referring Provider Marijo File MD     Treadmill   MPH 3   Grade 1   Minutes 10   METs 3.71     Bike   Level 1   Minutes 10   METs 3.22     NuStep   Level 3   SPM 80   Minutes 10   METs 2.5     Prescription Details   Frequency (times per week) 3   Duration Progress to 30 minutes of continuous aerobic without  signs/symptoms of physical distress     Intensity   THRR 40-80% of Max Heartrate 59-118   Ratings of Perceived Exertion 11-13   Perceived Dyspnea 0-4  Progression   Progression Continue to progress workloads to maintain intensity without signs/symptoms of physical distress.     Resistance Training   Training Prescription Yes   Weight 3lbs   Reps 10-15      Perform Capillary Blood Glucose checks as needed.  Exercise Prescription Changes:     Exercise Prescription Changes    Row Name 10/09/16 1700 10/24/16 1100           Response to Exercise   Blood Pressure (Admit) 120/60 114/64      Blood Pressure (Exercise) 116/54 128/72      Blood Pressure (Exit) 102/78 108/64      Heart Rate (Admit) 90 bpm 84 bpm      Heart Rate (Exercise) 113 bpm 117 bpm      Heart Rate (Exit) 100 bpm 89 bpm      Rating of Perceived Exertion (Exercise) 10 9      Symptoms  - none      Duration Continue with 30 min of aerobic exercise without signs/symptoms of physical distress. Continue with 30 min of aerobic exercise without signs/symptoms of physical distress.      Intensity THRR unchanged THRR unchanged        Progression   Progression Continue to progress workloads to maintain intensity without signs/symptoms of physical distress. Continue to progress workloads to maintain intensity without signs/symptoms of physical distress.      Average METs 3.4 4        Resistance Training   Training Prescription Yes Yes      Weight 3lbs 4lbs      Reps 10-15 10-15      Time  - 10 Minutes        Interval Training   Interval Training No No        Treadmill   MPH 3.2 3.2      Grade 2 2      Minutes 10 10      METs 4.33 4.33        Bike   Level 1 1.5      Minutes 10 10      METs 3.13 4.23        NuStep   Level 3 4      SPM 80 80      Minutes 10 10      METs 2.7 3.3        Home Exercise Plan   Plans to continue exercise at  - Home (comment)      Frequency  - Add 4 additional days to  program exercise sessions.      Initial Home Exercises Provided  - 10/18/16         Exercise Comments:     Exercise Comments    Row Name 10/18/16 1819 10/25/16 1201         Exercise Comments Reviewed home exercise guidelines and METs. Reviewed METs and goals with patient.         Exercise Goals and Review:     Exercise Goals    Row Name 09/28/16 0857             Exercise Goals   Increase Physical Activity Yes       Intervention Provide advice, education, support and counseling about physical activity/exercise needs.;Develop an individualized exercise prescription for aerobic and resistive training based on initial evaluation findings, risk stratification, comorbidities and participant's personal goals.       Expected Outcomes Achievement of increased cardiorespiratory fitness  and enhanced flexibility, muscular endurance and strength shown through measurements of functional capacity and personal statement of participant.       Increase Strength and Stamina Yes       Intervention Provide advice, education, support and counseling about physical activity/exercise needs.;Develop an individualized exercise prescription for aerobic and resistive training based on initial evaluation findings, risk stratification, comorbidities and participant's personal goals.       Expected Outcomes Achievement of increased cardiorespiratory fitness and enhanced flexibility, muscular endurance and strength shown through measurements of functional capacity and personal statement of participant.          Exercise Goals Re-Evaluation :     Exercise Goals Re-Evaluation    Row Name 10/25/16 1201             Exercise Goal Re-Evaluation   Exercise Goals Review Increase Physical Activity;Able to understand and use rate of perceived exertion (RPE) scale;Increase Strength and Stamina;Knowledge and understanding of Target Heart Rate Range (THRR)       Comments Pt is doing well with exercise. Pt feels  strength and stamina overall is improving but would like to work on upper body strength. Pt requested to switch from stationary bike to arm ergometer. Pt will increase hand weigths from 4lbs to 5 lbs.       Expected Outcomes Continue walking daily at home. Move to arm ergometer to improve upper body strength. Increase workloads as tolerated to achieve fitness and weight loss goals.           Discharge Exercise Prescription (Final Exercise Prescription Changes):     Exercise Prescription Changes - 10/24/16 1100      Response to Exercise   Blood Pressure (Admit) 114/64   Blood Pressure (Exercise) 128/72   Blood Pressure (Exit) 108/64   Heart Rate (Admit) 84 bpm   Heart Rate (Exercise) 117 bpm   Heart Rate (Exit) 89 bpm   Rating of Perceived Exertion (Exercise) 9   Symptoms none   Duration Continue with 30 min of aerobic exercise without signs/symptoms of physical distress.   Intensity THRR unchanged     Progression   Progression Continue to progress workloads to maintain intensity without signs/symptoms of physical distress.   Average METs 4     Resistance Training   Training Prescription Yes   Weight 4lbs   Reps 10-15   Time 10 Minutes     Interval Training   Interval Training No     Treadmill   MPH 3.2   Grade 2   Minutes 10   METs 4.33     Bike   Level 1.5   Minutes 10   METs 4.23     NuStep   Level 4   SPM 80   Minutes 10   METs 3.3     Home Exercise Plan   Plans to continue exercise at Home (comment)   Frequency Add 4 additional days to program exercise sessions.   Initial Home Exercises Provided 10/18/16      Nutrition:  Target Goals: Understanding of nutrition guidelines, daily intake of sodium '1500mg'$ , cholesterol '200mg'$ , calories 30% from fat and 7% or less from saturated fats, daily to have 5 or more servings of fruits and vegetables.  Biometrics:     Pre Biometrics - 09/28/16 1526      Pre Biometrics   Height '5\' 8"'$  (1.727 m)   Weight 192  lb 14.4 oz (87.5 kg)   Waist Circumference 43 inches   Hip Circumference 39.5  inches   Waist to Hip Ratio 1.09 %   BMI (Calculated) 29.4   Triceps Skinfold 22 mm   % Body Fat 31.3 %   Grip Strength 39 kg   Flexibility 9 in   Single Leg Stand 30 seconds       Nutrition Therapy Plan and Nutrition Goals:     Nutrition Therapy & Goals - 10/04/16 1044      Nutrition Therapy   Diet Carb Modified, Therapeutic Lifestyle Changes   Drug/Food Interactions Coumadin/Vit K     Personal Nutrition Goals   Nutrition Goal Pt to identify and limit food sources of saturated fat, trans fat, and sodium   Personal Goal #2 Pt to identify food quantities necessary to achieve weight loss of 6-20 lb at graduation from cardiac rehab. Goal wt of 175 lb desired.    Personal Goal #3 Pt able to name foods rich in vitamin K. (Pt taking Coumadin/Warfarin).     Intervention Plan   Intervention Prescribe, educate and counsel regarding individualized specific dietary modifications aiming towards targeted core components such as weight, hypertension, lipid management, diabetes, heart failure and other comorbidities.   Expected Outcomes Short Term Goal: Understand basic principles of dietary content, such as calories, fat, sodium, cholesterol and nutrients.;Long Term Goal: Adherence to prescribed nutrition plan.      Nutrition Discharge: Nutrition Scores:     Nutrition Assessments - 10/04/16 1044      MEDFICTS Scores   Pre Score 59      Nutrition Goals Re-Evaluation:   Nutrition Goals Re-Evaluation:   Nutrition Goals Discharge (Final Nutrition Goals Re-Evaluation):   Psychosocial: Target Goals: Acknowledge presence or absence of significant depression and/or stress, maximize coping skills, provide positive support system. Participant is able to verbalize types and ability to use techniques and skills needed for reducing stress and depression.  Initial Review & Psychosocial Screening:     Initial  Psych Review & Screening - 09/28/16 1523      Initial Review   Current issues with None Identified     Family Dynamics   Good Support System? Yes     Barriers   Psychosocial barriers to participate in program There are no identifiable barriers or psychosocial needs.     Screening Interventions   Interventions Encouraged to exercise      Quality of Life Scores:     Quality of Life - 09/28/16 1034      Quality of Life Scores   Health/Function Pre 23.17 %   Socioeconomic Pre 25 %   Psych/Spiritual Pre 21.71 %   Family Pre 27 %   GLOBAL Pre 23.81 %      PHQ-9: Recent Review Flowsheet Data    Depression screen [facility] 2/9 10/02/2016 10/26/2015 10/20/2014   Decreased Interest 0 0 0   Down, Depressed, Hopeless 0 0 0   PHQ - 2 Score 0 0 0     Interpretation of Total Score  Total Score Depression Severity:  1-4 = Minimal depression, 5-9 = Mild depression, 10-14 = Moderate depression, 15-19 = Moderately severe depression, 20-27 = Severe depression   Psychosocial Evaluation and Intervention:   Psychosocial Re-Evaluation:     Psychosocial Re-Evaluation    [place] Name 10/26/16 1001             Psychosocial Re-Evaluation   Current issues with None Identified       Interventions Encouraged to attend Cardiac Rehabilitation for the exercise       Continue Psychosocial Services  No Follow up required          Psychosocial Discharge (Final Psychosocial Re-Evaluation):     Psychosocial Re-Evaluation - 10/26/16 1001      Psychosocial Re-Evaluation   Current issues with None Identified   Interventions Encouraged to attend Cardiac Rehabilitation for the exercise   Continue Psychosocial Services  No Follow up required      Vocational Rehabilitation: Provide vocational rehab assistance to qualifying candidates.   Vocational Rehab Evaluation & Intervention:     Vocational Rehab - 09/28/16 1522      Initial Vocational Rehab Evaluation & Intervention   Assessment shows  need for Vocational Rehabilitation No      Education: Education Goals: Education classes will be provided on a weekly basis, covering required topics. Participant will state understanding/return demonstration of topics presented.  Learning Barriers/Preferences:     Learning Barriers/Preferences - 09/28/16 0851      Learning Barriers/Preferences   Learning Barriers Sight   Learning Preferences Video;Skilled Demonstration;Pictoral      Education Topics: Count Your Pulse:  -Group instruction provided by verbal instruction, demonstration, patient participation and written materials to support subject.  Instructors address importance of being able to find your pulse and how to count your pulse when at home without a heart monitor.  Patients get hands on experience counting their pulse with staff help and individually.   Heart Attack, Angina, and Risk Factor Modification:  -Group instruction provided by verbal instruction, video, and written materials to support subject.  Instructors address signs and symptoms of angina and heart attacks.    Also discuss risk factors for heart disease and how to make changes to improve heart health risk factors.   Functional Fitness:  -Group instruction provided by verbal instruction, demonstration, patient participation, and written materials to support subject.  Instructors address safety measures for doing things around the house.  Discuss how to get up and down off the floor, how to pick things up properly, how to safely get out of a chair without assistance, and balance training.   Meditation and Mindfulness:  -Group instruction provided by verbal instruction, patient participation, and written materials to support subject.  Instructor addresses importance of mindfulness and meditation practice to help reduce stress and improve awareness.  Instructor also leads participants through a meditation exercise.    Stretching for Flexibility and Mobility:   -Group instruction provided by verbal instruction, patient participation, and written materials to support subject.  Instructors lead participants through series of stretches that are designed to increase flexibility thus improving mobility.  These stretches are additional exercise for major muscle groups that are typically performed during regular warm up and cool down.   Hands Only CPR:  -Group verbal, video, and participation provides a basic overview of AHA guidelines for community CPR. Role-play of emergencies allow participants the opportunity to practice calling for help and chest compression technique with discussion of AED use.   Hypertension: -Group verbal and written instruction that provides a basic overview of hypertension including the most recent diagnostic guidelines, risk factor reduction with self-care instructions and medication management.    Nutrition I class: Heart Healthy Eating:  -Group instruction provided by PowerPoint slides, verbal discussion, and written materials to support subject matter. The instructor gives an explanation and review of the Therapeutic Lifestyle Changes diet recommendations, which includes a discussion on lipid goals, dietary fat, sodium, fiber, plant stanol/sterol esters, sugar, and the components of a well-balanced, healthy diet.   Nutrition II class: Lifestyle Skills:  -  Group instruction provided by PowerPoint slides, verbal discussion, and written materials to support subject matter. The instructor gives an explanation and review of label reading, grocery shopping for heart health, heart healthy recipe modifications, and ways to make healthier choices when eating out.   Diabetes Question & Answer:  -Group instruction provided by PowerPoint slides, verbal discussion, and written materials to support subject matter. The instructor gives an explanation and review of diabetes co-morbidities, pre- and post-prandial blood glucose goals,  pre-exercise blood glucose goals, signs, symptoms, and treatment of hypoglycemia and hyperglycemia, and foot care basics.   Diabetes Blitz:  -Group instruction provided by PowerPoint slides, verbal discussion, and written materials to support subject matter. The instructor gives an explanation and review of the physiology behind type 1 and type 2 diabetes, diabetes medications and rational behind using different medications, pre- and post-prandial blood glucose recommendations and Hemoglobin A1c goals, diabetes diet, and exercise including blood glucose guidelines for exercising safely.    Portion Distortion:  -Group instruction provided by PowerPoint slides, verbal discussion, written materials, and food models to support subject matter. The instructor gives an explanation of serving size versus portion size, changes in portions sizes over the last 20 years, and what consists of a serving from each food group.   Stress Management:  -Group instruction provided by verbal instruction, video, and written materials to support subject matter.  Instructors review role of stress in heart disease and how to cope with stress positively.     Exercising on Your Own:  -Group instruction provided by verbal instruction, power point, and written materials to support subject.  Instructors discuss benefits of exercise, components of exercise, frequency and intensity of exercise, and end points for exercise.  Also discuss use of nitroglycerin and activating EMS.  Review options of places to exercise outside of rehab.  Review guidelines for sex with heart disease.   Cardiac Drugs I:  -Group instruction provided by verbal instruction and written materials to support subject.  Instructor reviews cardiac drug classes: antiplatelets, anticoagulants, beta blockers, and statins.  Instructor discusses reasons, side effects, and lifestyle considerations for each drug class.   Cardiac Drugs II:  -Group instruction  provided by verbal instruction and written materials to support subject.  Instructor reviews cardiac drug classes: angiotensin converting enzyme inhibitors (ACE-I), angiotensin II receptor blockers (ARBs), nitrates, and calcium channel blockers.  Instructor discusses reasons, side effects, and lifestyle considerations for each drug class.   Anatomy and Physiology of the Circulatory System:  Group verbal and written instruction and models provide basic cardiac anatomy and physiology, with the coronary electrical and arterial systems. Review of: AMI, Angina, Valve disease, Heart Failure, Peripheral Artery Disease, Cardiac Arrhythmia, Pacemakers, and the ICD.   Other Education:  -Group or individual verbal, written, or video instructions that support the educational goals of the cardiac rehab program.   Knowledge Questionnaire Score:     Knowledge Questionnaire Score - 09/28/16 1031      Knowledge Questionnaire Score   Pre Score 17/24      Core Components/Risk Factors/Patient Goals at Admission:     Personal Goals and Risk Factors at Admission - 09/28/16 1035      Core Components/Risk Factors/Patient Goals on Admission    Weight Management Yes;Weight Maintenance;Weight Loss   Intervention Weight Management: Develop a combined nutrition and exercise program designed to reach desired caloric intake, while maintaining appropriate intake of nutrient and fiber, sodium and fats, and appropriate energy expenditure required for the weight goal.;Weight Management: Provide education  and appropriate resources to help participant work on and attain dietary goals.;Weight Management/Obesity: Establish reasonable short term and long term weight goals.   Expected Outcomes Short Term: Continue to assess and modify interventions until short term weight is achieved;Long Term: Adherence to nutrition and physical activity/exercise program aimed toward attainment of established weight goal;Weight Maintenance:  Understanding of the daily nutrition guidelines, which includes 25-35% calories from fat, 7% or less cal from saturated fats, less than '200mg'$  cholesterol, less than 1.5gm of sodium, & 5 or more servings of fruits and vegetables daily;Weight Loss: Understanding of general recommendations for a balanced deficit meal plan, which promotes 1-2 lb weight loss per week and includes a negative energy balance of [phone] kcal/d;Understanding recommendations for meals to include 15-35% energy as protein, 25-35% energy from fat, 35-60% energy from carbohydrates, less than '200mg'$  of dietary cholesterol, 20-35 gm of total fiber daily;Understanding of distribution of calorie intake throughout the day with the consumption of 4-5 meals/snacks   Diabetes Yes   Intervention Provide education about signs/symptoms and action to take for hypo/hyperglycemia.;Provide education about proper nutrition, including hydration, and aerobic/resistive exercise prescription along with prescribed medications to achieve blood glucose in normal ranges: Fasting glucose 65-99 mg/dL   Expected Outcomes Long Term: Attainment of HbA1C < 7%.;Short Term: Participant verbalizes understanding of the signs/symptoms and immediate care of hyper/hypoglycemia, proper foot care and importance of medication, aerobic/resistive exercise and nutrition plan for blood glucose control.   Hypertension Yes   Intervention Provide education on lifestyle modifcations including regular physical activity/exercise, weight management, moderate sodium restriction and increased consumption of fresh fruit, vegetables, and low fat dairy, alcohol moderation, and smoking cessation.;Monitor prescription use compliance.   Expected Outcomes Short Term: Continued assessment and intervention until BP is < 140/67m HG in hypertensive participants. < 130/831mHG in hypertensive participants with diabetes, heart failure or chronic kidney disease.;Long Term: Maintenance of blood pressure at  goal levels.   Lipids Yes   Intervention Provide education and support for participant on nutrition & aerobic/resistive exercise along with prescribed medications to achieve LDL '70mg'$ , HDL >'40mg'$ .   Expected Outcomes Short Term: Participant states understanding of desired cholesterol values and is compliant with medications prescribed. Participant is following exercise prescription and nutrition guidelines.;Long Term: Cholesterol controlled with medications as prescribed, with individualized exercise RX and with personalized nutrition plan. Value goals: LDL < '70mg'$ , HDL > 40 mg.   Stress Yes   Intervention Offer individual and/or small group education and counseling on adjustment to heart disease, stress management and health-related lifestyle change. Teach and support self-help strategies.;Refer participants experiencing significant psychosocial distress to appropriate mental health specialists for further evaluation and treatment. When possible, include family members and significant others in education/counseling sessions.   Expected Outcomes Short Term: Participant demonstrates changes in health-related behavior, relaxation and other stress management skills, ability to obtain effective social support, and compliance with psychotropic medications if prescribed.;Long Term: Emotional wellbeing is indicated by absence of clinically significant psychosocial distress or social isolation.      Core Components/Risk Factors/Patient Goals Review:      Goals and Risk Factor Review    Row Name 10/26/16 0956 10/26/16 1000           Core Components/Risk Factors/Patient Goals Review   Personal Goals Review Weight Management/Obesity;Lipids;Hypertension;Diabetes  -      Review David Carr's vital signs and home CBG's have been stalbe. David Carr doing well with exercise  -      Expected Outcomes David Carr continue to exercise  at cardiac rehab, take his presribed medications for  David Carr will continue to exercise  at cardiac rehab, take his presribed medications for HTN,Hyperlipidemia and diabetes         Core Components/Risk Factors/Patient Goals at Discharge (Final Review):      Goals and Risk Factor Review - 10/26/16 1000      Core Components/Risk Factors/Patient Goals Review   Expected Outcomes David Carr will continue to exercise at cardiac rehab, take his presribed medications for HTN,Hyperlipidemia and diabetes      ITP Comments:     ITP Comments    Row Name 09/28/16 0846           ITP Comments Dr. Fransico Him, Medical Director          Comments: David Carr is making expected progress toward personal goals after completing 10 sessions. Recommend continued exercise and life style modification education including  stress management and relaxation techniques to decrease cardiac risk profile. David Carr has been doing well with exercise at cardiac rehab.Barnet Pall, RN,BSN 10/26/2016 10:04 AM

## 2016-10-27 ENCOUNTER — Ambulatory Visit (HOSPITAL_COMMUNITY): Payer: Self-pay

## 2016-10-27 ENCOUNTER — Encounter (HOSPITAL_COMMUNITY)
Admission: RE | Admit: 2016-10-27 | Discharge: 2016-10-27 | Disposition: A | Discharge status: Home / Self Care | Payer: 59 | Source: Ambulatory Visit | Attending: Cardiology | Admitting: Cardiology

## 2016-10-27 DIAGNOSIS — Z9889 Other specified postprocedural states: Secondary | ICD-10-CM

## 2016-10-30 ENCOUNTER — Encounter (HOSPITAL_COMMUNITY)
Admission: RE | Admit: 2016-10-30 | Discharge: 2016-10-30 | Disposition: A | Discharge status: Home / Self Care | Payer: 59 | Source: Ambulatory Visit | Attending: Cardiology | Admitting: Cardiology

## 2016-10-30 ENCOUNTER — Ambulatory Visit (HOSPITAL_COMMUNITY): Payer: Self-pay

## 2016-10-30 DIAGNOSIS — Z9889 Other specified postprocedural states: Secondary | ICD-10-CM | POA: Diagnosis not present

## 2016-11-01 ENCOUNTER — Encounter (HOSPITAL_COMMUNITY)
Admission: RE | Admit: 2016-11-01 | Discharge: 2016-11-01 | Disposition: A | Discharge status: Home / Self Care | Payer: 59 | Source: Ambulatory Visit | Attending: Cardiology | Admitting: Cardiology

## 2016-11-01 ENCOUNTER — Ambulatory Visit (HOSPITAL_COMMUNITY): Payer: Self-pay

## 2016-11-01 DIAGNOSIS — Z9889 Other specified postprocedural states: Secondary | ICD-10-CM | POA: Diagnosis not present

## 2016-11-03 ENCOUNTER — Encounter (HOSPITAL_COMMUNITY): Payer: 59

## 2016-11-03 ENCOUNTER — Ambulatory Visit (HOSPITAL_COMMUNITY): Payer: Self-pay

## 2016-11-06 ENCOUNTER — Encounter (HOSPITAL_COMMUNITY)
Admission: RE | Admit: 2016-11-06 | Discharge: 2016-11-06 | Disposition: A | Discharge status: Home / Self Care | Payer: 59 | Source: Ambulatory Visit | Attending: Cardiology | Admitting: Cardiology

## 2016-11-06 ENCOUNTER — Ambulatory Visit (HOSPITAL_COMMUNITY): Payer: Self-pay

## 2016-11-06 DIAGNOSIS — Z9889 Other specified postprocedural states: Secondary | ICD-10-CM

## 2016-11-07 ENCOUNTER — Encounter: Payer: Self-pay | Admitting: Family Medicine

## 2016-11-07 ENCOUNTER — Ambulatory Visit (INDEPENDENT_AMBULATORY_CARE_PROVIDER_SITE_OTHER): Payer: 59 | Admitting: Family Medicine

## 2016-11-07 VITALS — BP 110/72 | HR 82 | Temp 98.6°F | Ht 68.0 in | Wt 193.0 lb

## 2016-11-07 DIAGNOSIS — E7800 Pure hypercholesterolemia, unspecified: Secondary | ICD-10-CM

## 2016-11-07 DIAGNOSIS — E11649 Type 2 diabetes mellitus with hypoglycemia without coma: Secondary | ICD-10-CM | POA: Diagnosis not present

## 2016-11-07 DIAGNOSIS — Z23 Encounter for immunization: Secondary | ICD-10-CM

## 2016-11-07 DIAGNOSIS — R351 Nocturia: Secondary | ICD-10-CM

## 2016-11-07 DIAGNOSIS — N401 Enlarged prostate with lower urinary tract symptoms: Secondary | ICD-10-CM | POA: Diagnosis not present

## 2016-11-07 DIAGNOSIS — Z794 Long term (current) use of insulin: Secondary | ICD-10-CM | POA: Diagnosis not present

## 2016-11-07 DIAGNOSIS — I10 Essential (primary) hypertension: Secondary | ICD-10-CM | POA: Diagnosis not present

## 2016-11-07 LAB — POCT URINALYSIS DIPSTICK
Bilirubin, UA: NEGATIVE
Clarity, UA: NEGATIVE
Ketones, UA: NEGATIVE
Leukocytes, UA: NEGATIVE
Nitrite, UA: NEGATIVE
PH UA: 5 (ref 5.0–8.0)
PROTEIN UA: NEGATIVE
RBC UA: NEGATIVE
UROBILINOGEN UA: 0.2 U/dL

## 2016-11-07 LAB — CBC WITH DIFFERENTIAL/PLATELET
BASOS ABS: 0 10*3/uL (ref 0.0–0.1)
BASOS PCT: 0.5 % (ref 0.0–3.0)
EOS ABS: 0 10*3/uL (ref 0.0–0.7)
Eosinophils Relative: 0.6 % (ref 0.0–5.0)
HCT: 49.1 % (ref 39.0–52.0)
Hemoglobin: 16.4 g/dL (ref 13.0–17.0)
Lymphocytes Relative: 20.9 % (ref 12.0–46.0)
Lymphs Abs: 0.9 10*3/uL (ref 0.7–4.0)
MCHC: 33.4 g/dL (ref 30.0–36.0)
MCV: 94.9 fl (ref 78.0–100.0)
MONO ABS: 0.4 10*3/uL (ref 0.1–1.0)
Monocytes Relative: 8.6 % (ref 3.0–12.0)
NEUTROS ABS: 3 10*3/uL (ref 1.4–7.7)
NEUTROS PCT: 69.4 % (ref 43.0–77.0)
PLATELETS: 143 10*3/uL — AB (ref 150.0–400.0)
RBC: 5.18 Mil/uL (ref 4.22–5.81)
RDW: 14.7 % (ref 11.5–15.5)
WBC: 4.3 10*3/uL (ref 4.0–10.5)

## 2016-11-07 LAB — BASIC METABOLIC PANEL
BUN: 12 mg/dL (ref 6–23)
CALCIUM: 9.8 mg/dL (ref 8.4–10.5)
CHLORIDE: 104 meq/L (ref 96–112)
CO2: 32 mEq/L (ref 19–32)
CREATININE: 0.96 mg/dL (ref 0.40–1.50)
GFR: 81.55 mL/min (ref >60.00)
Glucose, Bld: 164 mg/dL — ABNORMAL HIGH (ref 70–99)
Potassium: 5.2 mEq/L — ABNORMAL HIGH (ref 3.5–5.1)
Sodium: 142 mEq/L (ref 135–145)

## 2016-11-07 LAB — HEPATIC FUNCTION PANEL
ALBUMIN: 4.2 g/dL (ref 3.5–5.2)
ALK PHOS: 71 U/L (ref 39–117)
ALT: 22 U/L (ref 0–53)
AST: 20 U/L (ref 0–37)
BILIRUBIN DIRECT: 0.2 mg/dL (ref 0.0–0.3)
TOTAL PROTEIN: 6.5 g/dL (ref 6.0–8.3)
Total Bilirubin: 0.9 mg/dL (ref 0.2–1.2)

## 2016-11-07 LAB — LIPID PANEL
CHOL/HDL RATIO: 3
Cholesterol: 156 mg/dL (ref 0–200)
HDL: 47.9 mg/dL (ref >39.00)
LDL Cholesterol: 87 mg/dL (ref 0–99)
NONHDL: 108.01
Triglycerides: 103 mg/dL (ref 0.0–149.0)
VLDL: 20.6 mg/dL (ref 0.0–40.0)

## 2016-11-07 LAB — MICROALBUMIN / CREATININE URINE RATIO
Creatinine,U: 182.3 mg/dL
MICROALB UR: 1.5 mg/dL (ref 0.0–1.9)
Microalb Creat Ratio: 0.8 mg/g (ref 0.0–30.0)

## 2016-11-07 LAB — TSH: TSH: 2.66 u[IU]/mL (ref 0.35–4.50)

## 2016-11-07 LAB — PSA: PSA: 3.34 ng/mL (ref 0.10–4.00)

## 2016-11-07 LAB — HEMOGLOBIN A1C: HEMOGLOBIN A1C: 7.5 % — AB (ref 4.6–6.5)

## 2016-11-07 MED ORDER — SIMVASTATIN 20 MG PO TABS: 20.0000 mg | ORAL_TABLET | Freq: Every day | ORAL | 4 refills | Status: DC

## 2016-11-07 MED ORDER — ATENOLOL 25 MG PO TABS: 12.5000 mg | ORAL_TABLET | Freq: Every day | ORAL | 4 refills | Status: DC

## 2016-11-07 NOTE — Progress Notes (Signed)
David Carr is a 71 year old married male nonsmoker who comes in today for evaluation of the following problems  He has underlying diabetes and is on insulin 16 units at bedtime, metformin 500 long-acting one before supper. Sugars range in the 100 110 range A1c 6.8% which is normal.  He takes Tenormin 12.5 mg twice a day because of a history of hypertension. When he was discharged from the hospital after his valve surgery increased it to 25 twice a day and he had syncope which required an emergency room evaluation.  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  He gets routine eye care, dental,,,,,,,, recently had all his teeth removed,,,,, referred to Dr. Gloriann Loan for evaluation of dentures. Colonoscopy 2009 normal  Vaccinations flu shot given today information given on shingles  14 point review of systems reviewed and otherwise negative  Cognitive function normal he walks daily home health safety reviewed no issues identified, no guns in the house, he does have a healthcare power of attorney and living well.  No retinopathy nor neuropathy from his diabetes.  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 extremities normal skin normal peripheral pulses normal  #1 diabetes insulin-dependent,,,,, good control A1c 6.6% July 2018  #2 hypertension at goal,,,,,,  #3 hyperlipidemia goal  #4 AML,,,,,,, continue follow-up by oncology  #5 status post valve replacement,,,,,,,,  followed by cardiology asymptomatic

## 2016-11-07 NOTE — Patient Instructions (Signed)
Continue diet and exercise program  Continue current medications  Call your insurance company to find out where you can get the new shingles vaccine  Consult with Dr. Gloriann Loan about dentures

## 2016-11-08 ENCOUNTER — Encounter (HOSPITAL_COMMUNITY)
Admission: RE | Admit: 2016-11-08 | Discharge: 2016-11-08 | Disposition: A | Discharge status: Home / Self Care | Payer: 59 | Source: Ambulatory Visit | Attending: Cardiology | Admitting: Cardiology

## 2016-11-08 ENCOUNTER — Ambulatory Visit (HOSPITAL_COMMUNITY): Payer: Self-pay

## 2016-11-08 DIAGNOSIS — Z9889 Other specified postprocedural states: Secondary | ICD-10-CM | POA: Diagnosis not present

## 2016-11-10 ENCOUNTER — Ambulatory Visit (HOSPITAL_COMMUNITY): Payer: Self-pay

## 2016-11-10 ENCOUNTER — Encounter (HOSPITAL_COMMUNITY)
Admission: RE | Admit: 2016-11-10 | Discharge: 2016-11-10 | Disposition: A | Discharge status: Home / Self Care | Payer: 59 | Source: Ambulatory Visit | Attending: Cardiology | Admitting: Cardiology

## 2016-11-10 DIAGNOSIS — Z9889 Other specified postprocedural states: Secondary | ICD-10-CM | POA: Diagnosis not present

## 2016-11-13 ENCOUNTER — Ambulatory Visit (HOSPITAL_COMMUNITY): Payer: Self-pay

## 2016-11-13 ENCOUNTER — Other Ambulatory Visit: Payer: Self-pay

## 2016-11-13 ENCOUNTER — Encounter (HOSPITAL_COMMUNITY)
Admission: RE | Admit: 2016-11-13 | Discharge: 2016-11-13 | Disposition: A | Discharge status: Home / Self Care | Payer: 59 | Source: Ambulatory Visit | Attending: Cardiology | Admitting: Cardiology

## 2016-11-13 DIAGNOSIS — Z9889 Other specified postprocedural states: Secondary | ICD-10-CM | POA: Diagnosis not present

## 2016-11-13 MED ORDER — METFORMIN HCL ER 500 MG PO TB24: 500.0000 mg | ORAL_TABLET | Freq: Every day | ORAL | 1 refills | Status: DC

## 2016-11-15 ENCOUNTER — Ambulatory Visit (HOSPITAL_COMMUNITY): Payer: Self-pay

## 2016-11-15 ENCOUNTER — Encounter (HOSPITAL_COMMUNITY)
Admission: RE | Admit: 2016-11-15 | Discharge: 2016-11-15 | Disposition: A | Discharge status: Home / Self Care | Payer: 59 | Source: Ambulatory Visit | Attending: Cardiology | Admitting: Cardiology

## 2016-11-15 DIAGNOSIS — Z9889 Other specified postprocedural states: Secondary | ICD-10-CM

## 2016-11-17 ENCOUNTER — Ambulatory Visit (HOSPITAL_COMMUNITY): Payer: Self-pay

## 2016-11-17 ENCOUNTER — Encounter (HOSPITAL_COMMUNITY)
Admission: RE | Admit: 2016-11-17 | Discharge: 2016-11-17 | Disposition: A | Discharge status: Home / Self Care | Payer: 59 | Source: Ambulatory Visit | Attending: Cardiology | Admitting: Cardiology

## 2016-11-17 DIAGNOSIS — Z9889 Other specified postprocedural states: Secondary | ICD-10-CM

## 2016-11-20 ENCOUNTER — Other Ambulatory Visit: Payer: Self-pay

## 2016-11-20 ENCOUNTER — Encounter (HOSPITAL_COMMUNITY)
Admission: RE | Admit: 2016-11-20 | Discharge: 2016-11-20 | Disposition: A | Discharge status: Home / Self Care | Payer: 59 | Source: Ambulatory Visit | Attending: Cardiology | Admitting: Cardiology

## 2016-11-20 ENCOUNTER — Ambulatory Visit (HOSPITAL_COMMUNITY): Payer: Self-pay

## 2016-11-20 DIAGNOSIS — Z9889 Other specified postprocedural states: Secondary | ICD-10-CM | POA: Diagnosis not present

## 2016-11-20 MED ORDER — METFORMIN HCL ER 500 MG PO TB24: 500.0000 mg | ORAL_TABLET | Freq: Every day | ORAL | 1 refills | Status: DC

## 2016-11-22 ENCOUNTER — Ambulatory Visit (HOSPITAL_COMMUNITY): Payer: Self-pay

## 2016-11-22 ENCOUNTER — Encounter (HOSPITAL_COMMUNITY)
Admission: RE | Admit: 2016-11-22 | Discharge: 2016-11-22 | Disposition: A | Discharge status: Home / Self Care | Payer: 59 | Source: Ambulatory Visit | Attending: Cardiology | Admitting: Cardiology

## 2016-11-22 DIAGNOSIS — Z9889 Other specified postprocedural states: Secondary | ICD-10-CM | POA: Diagnosis not present

## 2016-11-22 NOTE — Progress Notes (Signed)
Cardiac Individual Treatment Plan  Patient Details  Name: David Carr. MRN: [id number] Date of Birth: August 26, 1945 Referring Provider:     CARDIAC REHAB PHASE II ORIENTATION from 09/28/2016 in [facility]  Referring Provider  Marijo File MD      Initial Encounter Date:    CARDIAC REHAB PHASE II ORIENTATION from 09/28/2016 in [facility]  Date  09/28/16  Referring Provider  Marijo File MD      Visit Diagnosis: 06/21/16 S/P mitral valve repair  Patient's Home Medications on Admission:  Current Outpatient Prescriptions:  .  acetaminophen (TYLENOL) 500 MG tablet, Take 2 tablets (1,000 mg total) by mouth every 6 (six) hours as needed., Disp: 30 tablet, Rfl: 0 .  aspirin 81 MG tablet, Take 81 mg by mouth daily with supper. , Disp: , Rfl:  .  atenolol (TENORMIN) 25 MG tablet, Take 0.5 tablets (12.5 mg total) by mouth daily., Disp: 100 tablet, Rfl: 4 .  chlorhexidine (PERIDEX) 0.12 % solution, Rinse with 15 mls twice daily for 30 seconds. Use after breakfast and at bedtime. Spit out excess. Do not swallow., Disp: 480 mL, Rfl: prn .  Insulin Glargine (BASAGLAR KWIKPEN) 100 UNIT/ML SOPN, Inject 16 Units into the skin at bedtime., Disp: , Rfl:  .  metFORMIN (GLUCOPHAGE-XR) 500 MG 24 hr tablet, Take 1 tablet (500 mg total) by mouth daily with supper., Disp: 90 tablet, Rfl: 1 .  simvastatin (ZOCOR) 20 MG tablet, Take 1 tablet (20 mg total) by mouth at bedtime., Disp: 100 tablet, Rfl: 4 No current facility-administered medications for this encounter.   Facility-Administered Medications Ordered in Other Encounters:  .  heparin lock flush 100 unit/mL, 500 Units, Intravenous, Once, Kale, Cloria Spring, MD .  sodium chloride 0.9 % injection 10 mL, 10 mL, Intravenous, PRN, Irene Limbo Cloria Spring, MD  Past Medical History: Past Medical History:  Diagnosis Date  . AML (acute myeloid leukemia) in remission [facility]) oncologist-  dr  Alvy Bimler-- per last note 10/ 2017 in remission 2 years   dx 09-11-2013  via bone marrow bx , FLT3 negative (NP M1 +)/  chemotherapy started 09-17-2013,  remission via marrow bx 11-06-2013,  4 cycles consolidation chemo w/ HiDAC 11-13-2013 to 03-05-2014  . BPH with urinary obstruction   . Chronic thrombocytopenic purpura ([place])   . Dental caries    periodontitis  . GERD (gastroesophageal reflux disease)   . Heart murmur   . History of kidney stones   . Hyperlipidemia   . Hypertension   . MVP (mitral valve prolapse)   . Pancytopenia, acquired ([place])   . Right ureteral stone   . 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 Severe mitral regurgitation   . Type 2 diabetes mellitus with hypoglycemia, with long-term current use of insulin ([facility]    endocrinologist-  dr gCon Memos . Wears denture    upper    Tobacco Use: History  Smoking Status  . Former Smoker  . Years: 20.00  . Types: Cigarettes  . Quit date: 04/18/1974  Smokeless Tobacco  . Never Used    Labs: Recent Review Flowsheet Data    Labs for ITP Cardiac and Pulmonary Rehab Latest Ref Rng & Units 06/21/2016 06/21/2016 06/22/2016 08/29/2016 11/07/2016   Cholestrol 0 - 200 mg/dL - - - -  156   LDLCALC 0 - 99 mg/dL - - - - 87   LDLDIRECT mg/dL - - - - -   HDL >39.00 mg/dL - - - - 47.90   Trlycerides 0.0 - 149.0 mg/dL - - - - 103.0   Hemoglobin A1c 4.6 - 6.5 % - - - 6.6 7.5(H)   PHART 7.350 - 7.450 - 7.404 - - -   PCO2ART 32.0 - 48.0 mmHg - 40.9 - - -   HCO3 20.0 - 28.0 mmol/L - 25.4 - - -   TCO2 0 - 100 mmol/L '25 27 21 '$ - -   O2SAT % - 98.0 63.3 - -      Capillary Blood Glucose: Lab Results  Component Value Date   GLUCAP 127 (H) 10/25/2016   GLUCAP 134 (H) 10/06/2016   GLUCAP 160 (H) 10/06/2016   GLUCAP 117  (H) 10/04/2016   GLUCAP 174 (H) 10/04/2016     Exercise Target Goals:    Exercise Program Goal: Individual exercise prescription set with THRR, safety & activity barriers. Participant demonstrates ability to understand and report RPE using BORG scale, to self-measure pulse accurately, and to acknowledge the importance of the exercise prescription.  Exercise Prescription Goal: Starting with aerobic activity 30 plus minutes a day, 3 days per week for initial exercise prescription. Provide home exercise prescription and guidelines that participant acknowledges understanding prior to discharge.  Activity Barriers & Risk Stratification:     Activity Barriers & Cardiac Risk Stratification - 09/28/16 0852      Activity Barriers & Cardiac Risk Stratification   Activity Barriers Muscular Weakness;Deconditioning;Other (comment)   Comments R RTCx2 8 yrs ago; R elbow surgery and hernia surgery 20 years ago   Cardiac Risk Stratification High      6 Minute Walk:     6 Minute Walk    Row Name 09/28/16 1038         6 Minute Walk   Phase Initial     Distance 1948 feet     Walk Time 6 minutes     # of Rest Breaks 0     MPH 3.67     METS 3.92     RPE 13     VO2 Peak 13.73     Symptoms No     Resting HR 96 bpm     Resting BP 108/70     Max Ex. HR 108 bpm     Max Ex. BP 140/80     2 Minute Post BP 120/84        Oxygen Initial Assessment:   Oxygen Re-Evaluation:   Oxygen Discharge (Final Oxygen Re-Evaluation):   Initial Exercise Prescription:     Initial Exercise Prescription - 09/28/16 1000      Date of Initial Exercise RX and Referring Provider   Date 09/28/16   Referring Provider Marijo File MD     Treadmill   MPH 3   Grade 1   Minutes 10   METs 3.71     Bike   Level 1   Minutes 10   METs 3.22     NuStep   Level 3   SPM 80   Minutes 10   METs 2.5     Prescription Details   Frequency (times per week) 3   Duration Progress to 30 minutes of  continuous aerobic without signs/symptoms of physical distress     Intensity   THRR 40-80% of Max Heartrate 59-118   Ratings of Perceived Exertion 11-13  Perceived Dyspnea 0-4     Progression   Progression Continue to progress workloads to maintain intensity without signs/symptoms of physical distress.     Resistance Training   Training Prescription Yes   Weight 3lbs   Reps 10-15      Perform Capillary Blood Glucose checks as needed.  Exercise Prescription Changes:     Exercise Prescription Changes    Row Name 10/09/16 1700 10/24/16 1100 11/06/16 1800 11/20/16 1500       Response to Exercise   Blood Pressure (Admit) 120/60 114/64 124/62 104/66    Blood Pressure (Exercise) 116/54 128/72 136/70 142/80    Blood Pressure (Exit) 102/78 108/64 104/60 104/60    Heart Rate (Admit) 90 bpm 84 bpm 92 bpm 91 bpm    Heart Rate (Exercise) 113 bpm 117 bpm 116 bpm 116 bpm    Heart Rate (Exit) 100 bpm 89 bpm 94 bpm 102 bpm    Rating of Perceived Exertion (Exercise) '10 9 12 11    '$ Symptoms  - none none none    Duration Continue with 30 min of aerobic exercise without signs/symptoms of physical distress. Continue with 30 min of aerobic exercise without signs/symptoms of physical distress. Continue with 30 min of aerobic exercise without signs/symptoms of physical distress. Continue with 30 min of aerobic exercise without signs/symptoms of physical distress.    Intensity THRR unchanged THRR unchanged THRR unchanged THRR unchanged      Progression   Progression Continue to progress workloads to maintain intensity without signs/symptoms of physical distress. Continue to progress workloads to maintain intensity without signs/symptoms of physical distress. Continue to progress workloads to maintain intensity without signs/symptoms of physical distress. Continue to progress workloads to maintain intensity without signs/symptoms of physical distress.    Average METs 3.4 4 4.1 4.1      Resistance  Training   Training Prescription Yes Yes Yes Yes    Weight 3lbs 4lbs 4lbs 4lbs    Reps 10-15 10-15 10-15 10-15    Time  - 10 Minutes 10 Minutes 10 Minutes      Interval Training   Interval Training No No No No      Treadmill   MPH 3.2 3.2 3.2 3.4    Grade '2 2 2 2    '$ Minutes '10 10 10 10    '$ METs 4.33 4.33 4.33 4.54      Bike   Level 1 1.5 1.5  -    Minutes '10 10 10  '$ -    METs 3.13 4.23 4.21  -      NuStep   Level '3 4 5 6    '$ SPM 80 80 80 85    Minutes '10 10 10 10    '$ METs 2.7 3.3 3.8 4.3      Arm Ergometer   Level  -  -  - 7    Minutes  -  -  - 10    METs  -  -  - 3.5      Home Exercise Plan   Plans to continue exercise at  - Home (comment) Home (comment) Home (comment)    Frequency  - Add 4 additional days to program exercise sessions. Add 4 additional days to program exercise sessions. Add 4 additional days to program exercise sessions.    Initial Home Exercises Provided  - 10/18/16 10/18/16 10/18/16       Exercise Comments:     Exercise Comments    Row Name 10/18/16 1819 10/25/16  1201 11/08/16 1111 11/22/16 0945     Exercise Comments Reviewed home exercise guidelines and METs. Reviewed METs and goals with patient. Reviewed METs with patient. Reviewed METs and goals with patient. Patient is highly motivated and doing well with exercise.       Exercise Goals and Review:     Exercise Goals    Row Name 09/28/16 0857             Exercise Goals   Increase Physical Activity Yes       Intervention Provide advice, education, support and counseling about physical activity/exercise needs.;Develop an individualized exercise prescription for aerobic and resistive training based on initial evaluation findings, risk stratification, comorbidities and participant's personal goals.       Expected Outcomes Achievement of increased cardiorespiratory fitness and enhanced flexibility, muscular endurance and strength shown through measurements of functional capacity and personal  statement of participant.       Increase Strength and Stamina Yes       Intervention Provide advice, education, support and counseling about physical activity/exercise needs.;Develop an individualized exercise prescription for aerobic and resistive training based on initial evaluation findings, risk stratification, comorbidities and participant's personal goals.       Expected Outcomes Achievement of increased cardiorespiratory fitness and enhanced flexibility, muscular endurance and strength shown through measurements of functional capacity and personal statement of participant.          Exercise Goals Re-Evaluation :     Exercise Goals Re-Evaluation    [place] Name 10/25/16 1201 11/22/16 0945           Exercise Goal Re-Evaluation   Exercise Goals Review Increase Physical Activity;Able to understand and use rate of perceived exertion (RPE) scale;Increase Strength and Stamina;Knowledge and understanding of Target Heart Rate Range (THRR) Increase Physical Activity;Knowledge and understanding of Target Heart Rate Range (THRR)      Comments Pt is doing well with exercise. Pt feels strength and stamina overall is improving but would like to work on upper body strength. Pt requested to switch from stationary bike to arm ergometer. Pt will increase hand weigths from 4lbs to 5 lbs. Patient is making excellent progress with exercise, increasing workloads consistently. Reviewed THRR with patient. Pt is walking 1.5 miles daily. Patient has been doing well on the arm ergometer.      Expected Outcomes Continue walking daily at home. Move to arm ergometer to improve upper body strength. Increase workloads as tolerated to achieve fitness and weight loss goals. Patient will continue daily walking routine at home in addition to progressing workloads at cardiac rehab to help increase strength and overall fitness.          Discharge Exercise Prescription (Final Exercise Prescription Changes):     Exercise  Prescription Changes - 11/20/16 1500      Response to Exercise   Blood Pressure (Admit) 104/66   Blood Pressure (Exercise) 142/80   Blood Pressure (Exit) 104/60   Heart Rate (Admit) 91 bpm   Heart Rate (Exercise) 116 bpm   Heart Rate (Exit) 102 bpm   Rating of Perceived Exertion (Exercise) 11   Symptoms none   Duration Continue with 30 min of aerobic exercise without signs/symptoms of physical distress.   Intensity THRR unchanged     Progression   Progression Continue to progress workloads to maintain intensity without signs/symptoms of physical distress.   Average METs 4.1     Resistance Training   Training Prescription Yes   Weight 4lbs   Reps 10-15  Time 10 Minutes     Interval Training   Interval Training No     Treadmill   MPH 3.4   Grade 2   Minutes 10   METs 4.54     NuStep   Level 6   SPM 85   Minutes 10   METs 4.3     Arm Ergometer   Level 7   Minutes 10   METs 3.5     Home Exercise Plan   Plans to continue exercise at Home (comment)   Frequency Add 4 additional days to program exercise sessions.   Initial Home Exercises Provided 10/18/16      Nutrition:  Target Goals: Understanding of nutrition guidelines, daily intake of sodium 1500mg , cholesterol 200mg , calories 30% from fat and 7% or less from saturated fats, daily to have 5 or more servings of fruits and vegetables.  Biometrics:     Pre Biometrics - 09/28/16 1526      Pre Biometrics   Height 5\' 8"  (1.727 m)   Weight 192 lb 14.4 oz (87.5 kg)   Waist Circumference 43 inches   Hip Circumference 39.5 inches   Waist to Hip Ratio 1.09 %   BMI (Calculated) 29.4   Triceps Skinfold 22 mm   % Body Fat 31.3 %   Grip Strength 39 kg   Flexibility 9 in   Single Leg Stand 30 seconds       Nutrition Therapy Plan and Nutrition Goals:     Nutrition Therapy & Goals - 10/04/16 1044      Nutrition Therapy   Diet Carb Modified, Therapeutic Lifestyle Changes   Drug/Food Interactions  Coumadin/Vit K     Personal Nutrition Goals   Nutrition Goal Pt to identify and limit food sources of saturated fat, trans fat, and sodium   Personal Goal #2 Pt to identify food quantities necessary to achieve weight loss of 6-20 lb at graduation from cardiac rehab. Goal wt of 175 lb desired.    Personal Goal #3 Pt able to name foods rich in vitamin K. (Pt taking Coumadin/Warfarin).     Intervention Plan   Intervention Prescribe, educate and counsel regarding individualized specific dietary modifications aiming towards targeted core components such as weight, hypertension, lipid management, diabetes, heart failure and other comorbidities.   Expected Outcomes Short Term Goal: Understand basic principles of dietary content, such as calories, fat, sodium, cholesterol and nutrients.;Long Term Goal: Adherence to prescribed nutrition plan.      Nutrition Discharge: Nutrition Scores:     Nutrition Assessments - 10/04/16 1044      MEDFICTS Scores   Pre Score 59      Nutrition Goals Re-Evaluation:   Nutrition Goals Re-Evaluation:   Nutrition Goals Discharge (Final Nutrition Goals Re-Evaluation):   Psychosocial: Target Goals: Acknowledge presence or absence of significant depression and/or stress, maximize coping skills, provide positive support system. Participant is able to verbalize types and ability to use techniques and skills needed for reducing stress and depression.  Initial Review & Psychosocial Screening:     Initial Psych Review & Screening - 09/28/16 1523      Initial Review   Current issues with None Identified     Family Dynamics   Good Support System? Yes     Barriers   Psychosocial barriers to participate in program There are no identifiable barriers or psychosocial needs.     Screening Interventions   Interventions Encouraged to exercise      Quality of Life Scores:  Quality of Life - 09/28/16 1034      Quality of Life Scores   Health/Function Pre  23.17 %   Socioeconomic Pre 25 %   Psych/Spiritual Pre 21.71 %   Family Pre 27 %   GLOBAL Pre 23.81 %      PHQ-9: Recent Review Flowsheet Data    Depression screen [facility] 2/9 10/02/2016 10/26/2015 10/20/2014   Decreased Interest 0 0 0   Down, Depressed, Hopeless 0 0 0   PHQ - 2 Score 0 0 0     Interpretation of Total Score  Total Score Depression Severity:  1-4 = Minimal depression, 5-9 = Mild depression, 10-14 = Moderate depression, 15-19 = Moderately severe depression, 20-27 = Severe depression   Psychosocial Evaluation and Intervention:   Psychosocial Re-Evaluation:     Psychosocial Re-Evaluation    [place] Name 10/26/16 1001 11/22/16 1620           Psychosocial Re-Evaluation   Current issues with None Identified None Identified      Interventions Encouraged to attend Cardiac Rehabilitation for the exercise Encouraged to attend Cardiac Rehabilitation for the exercise      Continue Psychosocial Services  No Follow up required No Follow up required         Psychosocial Discharge (Final Psychosocial Re-Evaluation):     Psychosocial Re-Evaluation - 11/22/16 1620      Psychosocial Re-Evaluation   Current issues with None Identified   Interventions Encouraged to attend Cardiac Rehabilitation for the exercise   Continue Psychosocial Services  No Follow up required      Vocational Rehabilitation: Provide vocational rehab assistance to qualifying candidates.   Vocational Rehab Evaluation & Intervention:     Vocational Rehab - 09/28/16 1522      Initial Vocational Rehab Evaluation & Intervention   Assessment shows need for Vocational Rehabilitation No      Education: Education Goals: Education classes will be provided on a weekly basis, covering required topics. Participant will state understanding/return demonstration of topics presented.  Learning Barriers/Preferences:     Learning Barriers/Preferences - 09/28/16 0851      Learning Barriers/Preferences    Learning Barriers Sight   Learning Preferences Video;Skilled Demonstration;Pictoral      Education Topics: Count Your Pulse:  -Group instruction provided by verbal instruction, demonstration, patient participation and written materials to support subject.  Instructors address importance of being able to find your pulse and how to count your pulse when at home without a heart monitor.  Patients get hands on experience counting their pulse with staff help and individually.   Heart Attack, Angina, and Risk Factor Modification:  -Group instruction provided by verbal instruction, video, and written materials to support subject.  Instructors address signs and symptoms of angina and heart attacks.    Also discuss risk factors for heart disease and how to make changes to improve heart health risk factors.   Functional Fitness:  -Group instruction provided by verbal instruction, demonstration, patient participation, and written materials to support subject.  Instructors address safety measures for doing things around the house.  Discuss how to get up and down off the floor, how to pick things up properly, how to safely get out of a chair without assistance, and balance training.   Meditation and Mindfulness:  -Group instruction provided by verbal instruction, patient participation, and written materials to support subject.  Instructor addresses importance of mindfulness and meditation practice to help reduce stress and improve awareness.  Instructor also leads participants through a meditation  exercise.    Stretching for Flexibility and Mobility:  -Group instruction provided by verbal instruction, patient participation, and written materials to support subject.  Instructors lead participants through series of stretches that are designed to increase flexibility thus improving mobility.  These stretches are additional exercise for major muscle groups that are typically performed during regular warm up  and cool down.   Hands Only CPR:  -Group verbal, video, and participation provides a basic overview of AHA guidelines for community CPR. Role-play of emergencies allow participants the opportunity to practice calling for help and chest compression technique with discussion of AED use.   Hypertension: -Group verbal and written instruction that provides a basic overview of hypertension including the most recent diagnostic guidelines, risk factor reduction with self-care instructions and medication management.    Nutrition I class: Heart Healthy Eating:  -Group instruction provided by PowerPoint slides, verbal discussion, and written materials to support subject matter. The instructor gives an explanation and review of the Therapeutic Lifestyle Changes diet recommendations, which includes a discussion on lipid goals, dietary fat, sodium, fiber, plant stanol/sterol esters, sugar, and the components of a well-balanced, healthy diet.   Nutrition II class: Lifestyle Skills:  -Group instruction provided by PowerPoint slides, verbal discussion, and written materials to support subject matter. The instructor gives an explanation and review of label reading, grocery shopping for heart health, heart healthy recipe modifications, and ways to make healthier choices when eating out.   Diabetes Question & Answer:  -Group instruction provided by PowerPoint slides, verbal discussion, and written materials to support subject matter. The instructor gives an explanation and review of diabetes co-morbidities, pre- and post-prandial blood glucose goals, pre-exercise blood glucose goals, signs, symptoms, and treatment of hypoglycemia and hyperglycemia, and foot care basics.   Diabetes Blitz:  -Group instruction provided by PowerPoint slides, verbal discussion, and written materials to support subject matter. The instructor gives an explanation and review of the physiology behind type 1 and type 2 diabetes, diabetes  medications and rational behind using different medications, pre- and post-prandial blood glucose recommendations and Hemoglobin A1c goals, diabetes diet, and exercise including blood glucose guidelines for exercising safely.    Portion Distortion:  -Group instruction provided by PowerPoint slides, verbal discussion, written materials, and food models to support subject matter. The instructor gives an explanation of serving size versus portion size, changes in portions sizes over the last 20 years, and what consists of a serving from each food group.   Stress Management:  -Group instruction provided by verbal instruction, video, and written materials to support subject matter.  Instructors review role of stress in heart disease and how to cope with stress positively.     Exercising on Your Own:  -Group instruction provided by verbal instruction, power point, and written materials to support subject.  Instructors discuss benefits of exercise, components of exercise, frequency and intensity of exercise, and end points for exercise.  Also discuss use of nitroglycerin and activating EMS.  Review options of places to exercise outside of rehab.  Review guidelines for sex with heart disease.   Cardiac Drugs I:  -Group instruction provided by verbal instruction and written materials to support subject.  Instructor reviews cardiac drug classes: antiplatelets, anticoagulants, beta blockers, and statins.  Instructor discusses reasons, side effects, and lifestyle considerations for each drug class.   Cardiac Drugs II:  -Group instruction provided by verbal instruction and written materials to support subject.  Instructor reviews cardiac drug classes: angiotensin converting enzyme inhibitors (ACE-I), angiotensin II  receptor blockers (ARBs), nitrates, and calcium channel blockers.  Instructor discusses reasons, side effects, and lifestyle considerations for each drug class.   Anatomy and Physiology of the  Circulatory System:  Group verbal and written instruction and models provide basic cardiac anatomy and physiology, with the coronary electrical and arterial systems. Review of: AMI, Angina, Valve disease, Heart Failure, Peripheral Artery Disease, Cardiac Arrhythmia, Pacemakers, and the ICD.   Other Education:  -Group or individual verbal, written, or video instructions that support the educational goals of the cardiac rehab program.   Knowledge Questionnaire Score:     Knowledge Questionnaire Score - 09/28/16 1031      Knowledge Questionnaire Score   Pre Score 17/24      Core Components/Risk Factors/Patient Goals at Admission:     Personal Goals and Risk Factors at Admission - 09/28/16 1035      Core Components/Risk Factors/Patient Goals on Admission    Weight Management Yes;Weight Maintenance;Weight Loss   Intervention Weight Management: Develop a combined nutrition and exercise program designed to reach desired caloric intake, while maintaining appropriate intake of nutrient and fiber, sodium and fats, and appropriate energy expenditure required for the weight goal.;Weight Management: Provide education and appropriate resources to help participant work on and attain dietary goals.;Weight Management/Obesity: Establish reasonable short term and long term weight goals.   Expected Outcomes Short Term: Continue to assess and modify interventions until short term weight is achieved;Long Term: Adherence to nutrition and physical activity/exercise program aimed toward attainment of established weight goal;Weight Maintenance: Understanding of the daily nutrition guidelines, which includes 25-35% calories from fat, 7% or less cal from saturated fats, less than '200mg'$  cholesterol, less than 1.5gm of sodium, & 5 or more servings of fruits and vegetables daily;Weight Loss: Understanding of general recommendations for a balanced deficit meal plan, which promotes 1-2 lb weight loss per week and includes  a negative energy balance of [phone] kcal/d;Understanding recommendations for meals to include 15-35% energy as protein, 25-35% energy from fat, 35-60% energy from carbohydrates, less than '200mg'$  of dietary cholesterol, 20-35 gm of total fiber daily;Understanding of distribution of calorie intake throughout the day with the consumption of 4-5 meals/snacks   Diabetes Yes   Intervention Provide education about signs/symptoms and action to take for hypo/hyperglycemia.;Provide education about proper nutrition, including hydration, and aerobic/resistive exercise prescription along with prescribed medications to achieve blood glucose in normal ranges: Fasting glucose 65-99 mg/dL   Expected Outcomes Long Term: Attainment of HbA1C < 7%.;Short Term: Participant verbalizes understanding of the signs/symptoms and immediate care of hyper/hypoglycemia, proper foot care and importance of medication, aerobic/resistive exercise and nutrition plan for blood glucose control.   Hypertension Yes   Intervention Provide education on lifestyle modifcations including regular physical activity/exercise, weight management, moderate sodium restriction and increased consumption of fresh fruit, vegetables, and low fat dairy, alcohol moderation, and smoking cessation.;Monitor prescription use compliance.   Expected Outcomes Short Term: Continued assessment and intervention until BP is < 140/79m HG in hypertensive participants. < 130/869mHG in hypertensive participants with diabetes, heart failure or chronic kidney disease.;Long Term: Maintenance of blood pressure at goal levels.   Lipids Yes   Intervention Provide education and support for participant on nutrition & aerobic/resistive exercise along with prescribed medications to achieve LDL '70mg'$ , HDL >'40mg'$ .   Expected Outcomes Short Term: Participant states understanding of desired cholesterol values and is compliant with medications prescribed. Participant is following exercise  prescription and nutrition guidelines.;Long Term: Cholesterol controlled with medications as prescribed, with individualized exercise RX  and with personalized nutrition plan. Value goals: LDL < '70mg'$ , HDL > 40 mg.   Stress Yes   Intervention Offer individual and/or small group education and counseling on adjustment to heart disease, stress management and health-related lifestyle change. Teach and support self-help strategies.;Refer participants experiencing significant psychosocial distress to appropriate mental health specialists for further evaluation and treatment. When possible, include family members and significant others in education/counseling sessions.   Expected Outcomes Short Term: Participant demonstrates changes in health-related behavior, relaxation and other stress management skills, ability to obtain effective social support, and compliance with psychotropic medications if prescribed.;Long Term: Emotional wellbeing is indicated by absence of clinically significant psychosocial distress or social isolation.      Core Components/Risk Factors/Patient Goals Review:      Goals and Risk Factor Review    Row Name 10/26/16 0956 10/26/16 1000 11/22/16 1620         Core Components/Risk Factors/Patient Goals Review   Personal Goals Review Weight Management/Obesity;Lipids;Hypertension;Diabetes  - Weight Management/Obesity;Lipids;Hypertension;Diabetes     Review Samir's vital signs and home CBG's have been stalbe. [place] is doing well with exercise  - Maggie's vital signs and home CBG's have been stalbe. Erastus is doing well with exercise     Expected Outcomes Dream will continue to exercise at cardiac rehab, take his presribed medications for  Gerber will continue to exercise at cardiac rehab, take his presribed medications for HTN,Hyperlipidemia and diabetes Kendan will continue to exercise at cardiac rehab, take his presribed medications for HTN,Hyperlipidemia and diabetes        Core  Components/Risk Factors/Patient Goals at Discharge (Final Review):      Goals and Risk Factor Review - 11/22/16 1620      Core Components/Risk Factors/Patient Goals Review   Personal Goals Review Weight Management/Obesity;Lipids;Hypertension;Diabetes   Review Joanthan's vital signs and home CBG's have been stalbe. Averie is doing well with exercise   Expected Outcomes Duston will continue to exercise at cardiac rehab, take his presribed medications for HTN,Hyperlipidemia and diabetes      ITP Comments:     ITP Comments    Row Name 09/28/16 0846           ITP Comments Dr. Fransico Him, Medical Director          Comments:Gaven is making expected progress toward personal goals after completing 22 sessions. Recommend continued exercise and life style modification education including  stress management and relaxation techniques to decrease cardiac risk profile. Carr continues to do well with exercise.Barnet Pall, RN,BSN 11/22/2016 4:29 PM

## 2016-11-24 ENCOUNTER — Encounter (HOSPITAL_COMMUNITY)
Admission: RE | Admit: 2016-11-24 | Discharge: 2016-11-24 | Disposition: A | Discharge status: Home / Self Care | Payer: 59 | Source: Ambulatory Visit | Attending: Cardiology | Admitting: Cardiology

## 2016-11-24 ENCOUNTER — Ambulatory Visit (HOSPITAL_COMMUNITY): Payer: Self-pay

## 2016-11-24 DIAGNOSIS — Z9889 Other specified postprocedural states: Secondary | ICD-10-CM

## 2016-11-27 ENCOUNTER — Encounter (HOSPITAL_COMMUNITY)
Admission: RE | Admit: 2016-11-27 | Discharge: 2016-11-27 | Disposition: A | Discharge status: Home / Self Care | Payer: 59 | Source: Ambulatory Visit | Attending: Cardiology | Admitting: Cardiology

## 2016-11-27 ENCOUNTER — Ambulatory Visit (HOSPITAL_COMMUNITY): Payer: Self-pay

## 2016-11-27 DIAGNOSIS — Z9889 Other specified postprocedural states: Secondary | ICD-10-CM

## 2016-11-29 ENCOUNTER — Ambulatory Visit (HOSPITAL_COMMUNITY): Payer: Self-pay

## 2016-11-29 ENCOUNTER — Telehealth (HOSPITAL_COMMUNITY): Payer: Self-pay | Admitting: *Deleted

## 2016-11-29 ENCOUNTER — Encounter (HOSPITAL_COMMUNITY)
Admission: RE | Admit: 2016-11-29 | Discharge: 2016-11-29 | Disposition: A | Discharge status: Home / Self Care | Payer: 59 | Source: Ambulatory Visit | Attending: Cardiology | Admitting: Cardiology

## 2016-11-29 DIAGNOSIS — Z9889 Other specified postprocedural states: Secondary | ICD-10-CM

## 2016-11-29 NOTE — Telephone Encounter (Signed)
-----   Message from Minus Breeding, MD sent at 11/29/2016  1:27 PM EDT ----- Regarding: RE: Clearance to lift weights at cardiac rehab OK to start light weight training.  ----- Message ----- From: Sol Passer Sent: 11/29/2016  12:11 PM To: Minus Breeding, MD Subject: Clearance to lift weights at cardiac rehab     Greetings Dr. Percival Spanish,  Your patient David Carr. has been in the cardiac rehabilitation program approximately 9 weeks s/p MV Repair on 06/21/16 and is doing well. Patient would like to begin weight training. Please indicate if you are agreeable to this change in exercise prescription.  We appreciate your assistance and referral of your patient to our program.  Sol Passer, MS, ACSM CEP

## 2016-11-30 ENCOUNTER — Encounter: Payer: Self-pay | Admitting: Internal Medicine

## 2016-11-30 ENCOUNTER — Telehealth: Payer: Self-pay | Admitting: Hematology and Oncology

## 2016-11-30 ENCOUNTER — Ambulatory Visit (INDEPENDENT_AMBULATORY_CARE_PROVIDER_SITE_OTHER): Payer: 59 | Admitting: Internal Medicine

## 2016-11-30 ENCOUNTER — Encounter: Payer: Self-pay | Admitting: Hematology and Oncology

## 2016-11-30 ENCOUNTER — Other Ambulatory Visit (HOSPITAL_BASED_OUTPATIENT_CLINIC_OR_DEPARTMENT_OTHER): Payer: 59

## 2016-11-30 ENCOUNTER — Ambulatory Visit (HOSPITAL_BASED_OUTPATIENT_CLINIC_OR_DEPARTMENT_OTHER): Payer: 59 | Admitting: Hematology and Oncology

## 2016-11-30 VITALS — BP 110/82 | HR 90 | Wt 195.0 lb

## 2016-11-30 DIAGNOSIS — E7800 Pure hypercholesterolemia, unspecified: Secondary | ICD-10-CM | POA: Diagnosis not present

## 2016-11-30 DIAGNOSIS — Z794 Long term (current) use of insulin: Secondary | ICD-10-CM | POA: Diagnosis not present

## 2016-11-30 DIAGNOSIS — C9201 Acute myeloblastic leukemia, in remission: Secondary | ICD-10-CM

## 2016-11-30 DIAGNOSIS — E11649 Type 2 diabetes mellitus with hypoglycemia without coma: Secondary | ICD-10-CM

## 2016-11-30 DIAGNOSIS — D696 Thrombocytopenia, unspecified: Secondary | ICD-10-CM | POA: Diagnosis not present

## 2016-11-30 LAB — CBC WITH DIFFERENTIAL/PLATELET
BASO%: 0.2 % (ref 0.0–2.0)
BASOS ABS: 0 10*3/uL (ref 0.0–0.1)
EOS ABS: 0 10*3/uL (ref 0.0–0.5)
EOS%: 0.9 % (ref 0.0–7.0)
HCT: 46.2 % (ref 38.4–49.9)
HGB: 16.7 g/dL (ref 13.0–17.1)
LYMPH%: 23 % (ref 14.0–49.0)
MCH: 32.7 pg (ref 27.2–33.4)
MCHC: 36.1 g/dL — AB (ref 32.0–36.0)
MCV: 90.4 fL (ref 79.3–98.0)
MONO#: 0.4 10*3/uL (ref 0.1–0.9)
MONO%: 9.6 % (ref 0.0–14.0)
NEUT%: 66.3 % (ref 39.0–75.0)
NEUTROS ABS: 2.9 10*3/uL (ref 1.5–6.5)
Platelets: 131 10*3/uL — ABNORMAL LOW (ref 140–400)
RBC: 5.11 10*6/uL (ref 4.20–5.82)
RDW: 13.5 % (ref 11.0–14.6)
WBC: 4.4 10*3/uL (ref 4.0–10.3)
lymph#: 1 10*3/uL (ref 0.9–3.3)
nRBC: 0 % (ref 0–0)

## 2016-11-30 MED ORDER — METFORMIN HCL ER 500 MG PO TB24: 1000.0000 mg | ORAL_TABLET | Freq: Every day | ORAL | 3 refills | Status: DC

## 2016-11-30 NOTE — Patient Instructions (Addendum)
Please continue: - Basaglar 16 units at bedtime.  Please increase: - Metformin ER to 1000 mg with dinner  Stay off CoolAid.  Please return in 3 months with your sugar log.

## 2016-11-30 NOTE — Telephone Encounter (Signed)
Scheduled appt per 10/18 los - Gave patient AVS and calender per los.  

## 2016-11-30 NOTE — Assessment & Plan Note (Signed)
He is not symptomatic. His blood work confirmed the patient remained in remission Currently, the patient has been in remission for over 3 years I plan to see him back in 9 months

## 2016-11-30 NOTE — Assessment & Plan Note (Signed)
The cause could be related to his underlying medical condition. It is mild and there is little change compared from previous platelet count. The patient denies recent history of bleeding such as epistaxis, hematuria or hematochezia. He is asymptomatic from the thrombocytopenia. I will observe for now.  he does not require further evaluation or treatment for this

## 2016-11-30 NOTE — Progress Notes (Signed)
Patient ID: David Carr., male   DOB: 1945-10-24, 71 y.o.   MRN: [id number]   HPI: David Carr. is a 71 y.o.-year-old male, returning for f/u for DM2, dx in end of the 1990s, insulin-dependent since ~2014, uncontrolled, without long term complications. Last visit 3 mo ago.  At last visit, we discussed about cutting out concentrated, processed sweets from his diet: pop tarts and twinkies. Now quit.  He goes to cardiac rehab MWF at 9:30 am.  Last hemoglobin A1c was: Lab Results  Component Value Date   HGBA1C 7.5 (H) 11/07/2016   HGBA1C 6.6 08/29/2016   HGBA1C 6.4 (H) 06/19/2016   Pt was on a regimen of: - Humalog 75/25 35 >> increased to 45 units at bedtime! - Metformin 500 mg 2x a day, with meals >> now 500 mg at dinnertime  Now on: - Metformin ER 1000 mg with dinner  - restarted at last visit - Basaglar 30 >> 20  >> 16 units at bedtime.  Pt checks his sugars 3x a day: - am: 90s-110 >> 100-115 >> 106-134 >> 117-143 - 2h after b'fast: n/c >> 117-144, 275 (poptarts) >> 138-171 - before lunch: n/c >> 122, 177 (after 275) >> 117-146 - 2h after lunch: n/c >> 150-206 >> 127 - before dinner: n/c >> 102-130 >> 93, 126 - 2h after dinner: n/c >> 153-189 >> 153-199 - coolaid after dinner - bedtime: n/c >> 140-180s >> 148-193 >> n/c - nighttime: n/c He has hypoglycemia awareness - ? CBG level He had an episode of loss of consciousness when sugars reached 60 in 07/17/2016. He decreased his insulin doses then to 20 units daily, and subsequently, on 07/27/2016, to 16 units daily. No lows since then. Highest sugar was 240s >> 265 (after surgery) >> 199  Glucometer: Freestyle Lite  Pt's meals are: - Breakfast: coffee + nab or slice pound cake + pop tarts - Lunch: fast food or sandwich or skips - Dinner: meat + veggies  - Snacks: 1, before bedtime He was drinking sodas >> quit.  He walks 2x day: 1.5 mi - with his black lab.  - No h/o CKD, last BUN/creatinine:  Lab  Results  Component Value Date   BUN 12 11/07/2016   BUN 8 07/16/2016   CREATININE 0.96 11/07/2016   CREATININE 0.92 07/16/2016   - last set of lipids: Lab Results  Component Value Date   CHOL 156 11/07/2016   HDL 47.90 11/07/2016   LDLCALC 87 11/07/2016   LDLDIRECT 74.4 06/19/2006   TRIG 103.0 11/07/2016   CHOLHDL 3 11/07/2016  On Zocor. - last eye exam was in 2013 >> No DR. He stilldid not have another eye exam despite multiple promptings.  - he denies numbness and tingling in his feet.  He has AML in remission.  Now off Coumadin.  ROS: Constitutional: no weight gain/no weight loss, no fatigue, no subjective hyperthermia, no subjective hypothermia Eyes: no blurry vision, no xerophthalmia ENT: no sore throat, no nodules palpated in throat, no dysphagia, no odynophagia, no hoarseness Cardiovascular: no CP/no SOB/no palpitations/no leg swelling Respiratory: no cough/no SOB/no wheezing Gastrointestinal: no N/no V/no D/no C/no acid reflux Musculoskeletal: no muscle aches/no joint aches Skin: no rashes, no hair loss Neurological: no tremors/no numbness/no tingling/no dizziness  I reviewed pt's medications, allergies, PMH, social hx, family hx, and changes were documented in the history of present illness. Otherwise, unchanged from my initial visit note.  Past Medical History:  Diagnosis Date  . AML (  acute myeloid leukemia) in remission [facility]) oncologist-  dr Alvy Bimler-- per last note 10/ 2017 in remission 2 years   dx 09-11-2013  via bone marrow bx , FLT3 negative (NP M1 +)/  chemotherapy started 09-17-2013,  remission via marrow bx 11-06-2013,  4 cycles consolidation chemo w/ HiDAC 11-13-2013 to 03-05-2014  . BPH with urinary obstruction   . Chronic thrombocytopenic purpura ([place])   . Dental caries    periodontitis  . GERD (gastroesophageal reflux disease)   . Heart murmur   . History of kidney stones   . Hyperlipidemia   . Hypertension   . MVP (mitral valve prolapse)   .  Pancytopenia, acquired ([place])   . Right ureteral stone   . 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 Severe mitral regurgitation   . Type 2 diabetes mellitus with hypoglycemia, with long-term current use of insulin ([facility]    endocrinologist-  dr gCon Memos . Wears denture    upper   Past Surgical History:  Procedure Laterality Date  . CARDIAC CATHETERIZATION    . CARDIOVASCULAR STRESS TEST  07/23/2013   Low risk nuclear study w/ mild inferior ischemia/  normal LV function and wall motion, ef 69%  . CYSTOSCOPY WITH RETROGRADE PYELOGRAM, URETEROSCOPY AND STENT PLACEMENT Right 04/20/2016   Procedure: CYSTOSCOPY WITH RETROGRADE PYELOGRAM, URETEROSCOPY , STONE BASKETRY AND STENT PLACEMENT;  Surgeon: SFranchot Gallo MD;  [facility]  Service: Urology;  Laterality: Right;  . EXTRACORPOREAL SHOCK WAVE LITHOTRIPSY  yrs ago  . HOLMIUM LASER APPLICATION Right 32/02/1939  Procedure: HOLMIUM LASER APPLICATION;  Surgeon: SFranchot Gallo MD;  [facility]  Service: Urology;  Laterality: Right;  . INGUINAL HERNIA REPAIR Left 1990's  . MITRAL VALVE REPAIR N/A 06/21/2016   Procedure: MINIMALLY INVASIVE MITRAL VALVE REPAIR (MVR) USING SORIN MEMO 3D SIZE 32 SEMIRIGID ANNULOPLASTY RING;  Surgeon: ORexene Alberts MD;  [place] [facility]  Service: Open Heart Surgery;  Laterality: N/A;  . MULTIPLE EXTRACTIONS WITH ALVEOLOPLASTY N/A 06/08/2016   Procedure: MULTIPLE EXTRACTION WITH ALVEOLOPLASTY AND PRE-PROSTHETIC SURGERY AS NEEDED;  Surgeon: RLenn Cal DDS;  [place] [facility]  Service: Oral Surgery;  Laterality: N/A;  . PATENT FORAMEN OVALE(PFO) CLOSURE N/A 06/21/2016   Procedure: PATENT FORAMEN OVALE (PFO) CLOSURE;  Surgeon: ORexene Alberts MD;  [place] [facility]  Service: Open Heart Surgery;  Laterality: N/A;  . RIGHT/LEFT HEART CATH AND CORONARY ANGIOGRAPHY N/A 06/07/2016   Procedure: Right/Left Heart Cath and Coronary Angiography;  Surgeon: MSherren Mocha MD;  [facility];  Service: Cardiovascular;  Laterality: N/A;  . ROTATOR CUFF REPAIR Right x2  last one 2011  . TEE WITHOUT CARDIOVERSION N/A 01/11/2016   Procedure: TRANSESOPHAGEAL ECHOCARDIOGRAM (TEE);  Surgeon: KPixie Casino MD;  [facility];  Service: Cardiovascular;  Laterality: N/A; Severe MR associated w/ flail P2 segment of MV;  no LAA thromus;  small PFO by saline microbubble contrast after valsalva; LVEF 55-60%  . TEE WITHOUT CARDIOVERSION N/A 06/21/2016   Procedure: TRANSESOPHAGEAL ECHOCARDIOGRAM (TEE);  Surgeon: ORexene Alberts MD;  [place] [facility]  Service: Open Heart Surgery;  Laterality: N/A;  . TENNIS ELBOW RELEASE/NIRSCHEL PROCEDURE Right 1990's  . TRANSTHORACIC ECHOCARDIOGRAM  12/22/2015   grade 1 diastolic dysfunction,  ef 55-60%/  moderate MVP  involving posterior leaflet w/ partial flail and severe MR via CW doppler (peak gradient 44mHg)/  trivial TR   Social History   Social History  . Marital status: Married    Spouse name: N/A  . Number of children: 2   Occupational History  . accountant   Social History Main Topics  . Smoking status: Former SResearch scientist (life sciences) . Smokeless tobacco: Never Used  . Alcohol use No  . Drug use: No   Current Outpatient Prescriptions on File Prior to Visit  Medication Sig Dispense Refill  . acetaminophen (TYLENOL) 500 MG tablet Take 2 tablets (1,000 mg total) by mouth every 6 (six) hours as needed. 30 tablet 0  . aspirin 81 MG tablet Take 81 mg by mouth daily with supper.     .Marland Kitchenatenolol (TENORMIN) 25 MG tablet Take 0.5 tablets (12.5 mg total) by mouth daily. 100 tablet 4  . chlorhexidine (PERIDEX) 0.12 % solution Rinse with 15 mls twice daily for 30 seconds. Use after breakfast and at bedtime.  Spit out excess. Do not swallow. 480 mL prn  . Insulin Glargine (BASAGLAR KWIKPEN) 100 UNIT/ML SOPN Inject 16 Units into the skin at bedtime.    . metFORMIN (GLUCOPHAGE-XR) 500 MG 24 hr tablet Take 1 tablet (500 mg total) by mouth daily with supper. 90 tablet 1  . simvastatin (ZOCOR) 20 MG tablet Take 1 tablet (20 mg total) by mouth at bedtime. 100 tablet 4   Current Facility-Administered Medications on File Prior to Visit  Medication Dose Route Frequency Provider Last Rate Last Dose  . heparin lock flush 100 unit/mL  500 Units Intravenous Once KBrunetta Genera MD      . sodium chloride 0.9 % injection 10 mL  10 mL Intravenous PRN KBrunetta Genera MD        Allergies  Allergen Reactions  . Prochlorperazine Other (See Comments)    ARRHYTHMIAS  . Morphine And Related     Shuts bladder down.   Family History  Problem Relation Age of Onset  . Asthma Sister   . Cancer Daughter        carcinoid tumor   PE: BP 110/82 (BP Location: Left Arm, Patient Position: Sitting, Cuff Size: Normal)   Pulse 90   Wt 195 lb (88.5 kg)   SpO2 96%   BMI 29.65 kg/m  Wt Readings from Last 3 Encounters:  11/30/16 195 lb (88.5 kg)  11/07/16 193 lb (87.5 kg)  10/23/16 194 lb (88 kg)   Constitutional: overweight, in NAD Eyes: PERRLA, EOMI, no exophthalmos ENT: moist mucous membranes, no thyromegaly, no cervical lymphadenopathy Cardiovascular: tachycardia, RR, No RG, + 1/6 SEM Respiratory: CTA B Gastrointestinal: abdomen soft, NT, ND, BS+ Musculoskeletal: no deformities, strength intact in all 4 Skin: moist, warm, no rashes Neurological: no tremor with outstretched hands, DTR normal in all 4  ASSESSMENT: 1. DM2, insulin-dependent, uncontrolled, without Long term complications, but with hypoglycemia  2. HL  PLAN:  1. Patient with long-standing, uncontrolled diabetes, with higher sugars since last visit and a higher HbA1c. He is exercising 3x a week at the rehab center and walking his dog.  He stopped twinkies and poptarts but drinking CoolAid after dinner >> advised to stop. We will also increase Metformin ER to 1000 mg with dinner - I suggested to:  Patient Instructions  Please continue: - Basaglar 16 units at bedtime.  Please increase: - Metformin ER to 1000 mg with dinner  Stay off CoolAid.  Please return in 3 months with  your sugar log.   - we reviewed his HbA1c obtained last mo: higher, at 7.5%  - continue checking sugars at different times of the day - check 1-2x a day, rotating checks - advised for yearly eye exams >> he is not UTD - UTD with flu shot - Return to clinic in 3 mo with sugar log   2. HL - reviewed together latest Lipid panel >> at goal - continue Zocor, no SEs   Philemon Kingdom, MD PhD [facility] Endocrinology

## 2016-11-30 NOTE — Progress Notes (Signed)
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, NPM1 positive      09/17/2013 -  Chemotherapy    He was given induction chemotherapy at [facility] ([facility]+3)      10/01/2013 -  Chemotherapy    He had repeat induction chemotherapy due to persistent disease (5+2)      11/06/2013 Bone Marrow Biopsy    Bone marrow biopsy confirmed remission      11/13/2013 - 11/17/2013 Chemotherapy    He received cycle 1 of consolidation chemotherapy with HiDAC      12/18/2013 - 12/22/2013 Chemotherapy    He received cycle 2 of consolidation chemotherapy with [facility]      01/23/2014 - 01/28/2014 Chemotherapy    he received cycle 3 of consolidation chemotherapy with HiDAC      03/05/2014 - 03/10/2014 Hospital Admission    he received cycle 4 of consolidation chemotherapy with HiDAC       INTERVAL HISTORY: Please see below for problem oriented charting. He returns for further follow-up Since last time I saw him, he had surgery He is active with cardiac rehab He denies recent infection He is up-to-date with his vaccination program The patient denies any recent signs or symptoms of bleeding such as spontaneous epistaxis, hematuria or hematochezia.  REVIEW OF SYSTEMS:   Constitutional: Denies fevers, chills or abnormal weight loss Eyes: Denies blurriness of vision Ears, nose, mouth, throat, and face: Denies mucositis or sore throat Respiratory: Denies cough, dyspnea or wheezes Cardiovascular: Denies palpitation, chest discomfort or lower extremity swelling Gastrointestinal:  Denies nausea, heartburn or change in bowel habits Skin: Denies abnormal skin rashes Lymphatics: Denies new lymphadenopathy or easy bruising Neurological:Denies numbness, tingling or new  weaknesses Behavioral/Psych: Mood is stable, no new changes  All other systems were reviewed with the patient and are negative.  I have reviewed the past medical history, past surgical history, social history and family history with the patient and they are unchanged from previous note.  ALLERGIES:  is allergic to prochlorperazine and morphine and related.  MEDICATIONS:  Current Outpatient Prescriptions  Medication Sig Dispense Refill  . acetaminophen (TYLENOL) 500 MG tablet Take 2 tablets (1,000 mg total) by mouth every 6 (six) hours as needed. 30 tablet 0  . aspirin 81 MG tablet Take 81 mg by mouth daily with supper.     Marland Kitchen atenolol (TENORMIN) 25 MG tablet Take 0.5 tablets (12.5 mg total) by mouth daily. 100 tablet 4  . Insulin Glargine (BASAGLAR KWIKPEN) 100 UNIT/ML SOPN Inject 16 Units into the skin at bedtime.    . metFORMIN (GLUCOPHAGE-XR) 500 MG 24 hr tablet Take 2 tablets (1,000 mg total) by mouth daily with supper. 180 tablet 3  . simvastatin (ZOCOR) 20 MG tablet Take 1 tablet (20 mg total) by mouth at bedtime. 100 tablet 4   No current facility-administered medications for this visit.    Facility-Administered Medications Ordered in Other Visits  Medication Dose Route Frequency Provider Last Rate Last Dose  . heparin lock flush 100 unit/mL  500 Units Intravenous Once Brunetta Genera, MD      . sodium chloride 0.9 % injection 10 mL  10 mL Intravenous PRN Brunetta Genera, MD        PHYSICAL EXAMINATION: ECOG PERFORMANCE STATUS:  0 - Asymptomatic  Vitals:   11/30/16 1502  BP: 133/80  Pulse: 100  Resp: 18  Temp: 98.Carr F (37.1 C)  SpO2: 95%   Filed Weights   11/30/16 1502  Weight: 195 lb 1.6 oz (88.5 kg)    GENERAL:alert, no distress and comfortable SKIN: skin color, texture, turgor are normal, no rashes or significant lesions EYES: normal, Conjunctiva are pink and non-injected, sclera clear OROPHARYNX:no exudate, no erythema and lips, buccal mucosa, and  tongue normal  NECK: supple, thyroid normal size, non-tender, without nodularity LYMPH:  no palpable lymphadenopathy in the cervical, axillary or inguinal LUNGS: clear to auscultation and percussion with normal breathing effort HEART: regular rate & rhythm and no murmurs and no lower extremity edema ABDOMEN:abdomen soft, non-tender and normal bowel sounds Musculoskeletal:no cyanosis of digits and no clubbing  NEURO: alert & oriented x 3 with fluent speech, no focal motor/sensory deficits  LABORATORY DATA:  I have reviewed the data as listed    Component Value Date/Time   NA 142 11/07/2016 1109   NA 139 07/03/2016 1233   NA 138 12/02/2015 1335   K 5.2 (H) 11/07/2016 1109   K 4.1 12/02/2015 1335   CL 104 11/07/2016 1109   CO2 32 11/07/2016 1109   CO2 24 12/02/2015 1335   GLUCOSE 164 (H) 11/07/2016 1109   GLUCOSE 252 (H) 12/02/2015 1335   BUN 12 11/07/2016 1109   BUN 15 07/03/2016 1233   BUN 14.2 12/02/2015 1335   CREATININE 0.96 11/07/2016 1109   CREATININE 0.90 01/03/2016 1056   CREATININE 1.0 12/02/2015 1335   CALCIUM 9.8 11/07/2016 1109   CALCIUM 9.0 12/02/2015 1335   PROT 6.5 11/07/2016 1109   PROT 6.6 12/02/2015 1335   ALBUMIN 4.2 11/07/2016 1109   ALBUMIN 3.5 12/02/2015 1335   AST 20 11/07/2016 1109   AST 21 12/02/2015 1335   ALT 22 11/07/2016 1109   ALT 28 12/02/2015 1335   ALKPHOS 71 11/07/2016 1109   ALKPHOS 79 12/02/2015 1335   BILITOT 0.9 11/07/2016 1109   BILITOT 0.61 12/02/2015 1335   GFRNONAA >60 07/16/2016 1536   GFRAA >60 07/16/2016 1536    No results found for: SPEP, UPEP  Lab Results  Component Value Date   WBC 4.4 11/30/2016   NEUTROABS 2.9 11/30/2016   HGB 16.Carr 11/30/2016   HCT 46.2 11/30/2016   MCV 90.4 11/30/2016   PLT 131 (L) 11/30/2016      Chemistry      Component Value Date/Time   NA 142 11/07/2016 1109   NA 139 07/03/2016 1233   NA 138 12/02/2015 1335   K 5.2 (H) 11/07/2016 1109   K 4.1 12/02/2015 1335   CL 104 11/07/2016  1109   CO2 32 11/07/2016 1109   CO2 24 12/02/2015 1335   BUN 12 11/07/2016 1109   BUN 15 07/03/2016 1233   BUN 14.2 12/02/2015 1335   CREATININE 0.96 11/07/2016 1109   CREATININE 0.90 01/03/2016 1056   CREATININE 1.0 12/02/2015 1335      Component Value Date/Time   CALCIUM 9.8 11/07/2016 1109   CALCIUM 9.0 12/02/2015 1335   ALKPHOS 71 11/07/2016 1109   ALKPHOS 79 12/02/2015 1335   AST 20 11/07/2016 1109   AST 21 12/02/2015 1335   ALT 22 11/07/2016 1109   ALT 28 12/02/2015 1335   BILITOT 0.9 11/07/2016 1109   BILITOT 0.61 12/02/2015 1335     ASSESSMENT & PLAN:  AML (acute myeloid leukemia) in remission [facility]) He is not  symptomatic. His blood work confirmed the patient remained in remission Currently, the patient has been in remission for over 3 years I plan to see him back in 9 months   Thrombocytopenia ([place]) The cause could be related to his underlying medical condition. It is mild and there is little change compared from previous platelet count. The patient denies recent history of bleeding such as epistaxis, hematuria or hematochezia. He is asymptomatic from the thrombocytopenia. I will observe for now.  he does not require further evaluation or treatment for this    No orders of the defined types were placed in this encounter.  All questions were answered. The patient knows to call the clinic with any problems, questions or concerns. No barriers to learning was detected. I spent 10 minutes counseling the patient face to face. The total time spent in the appointment was 15 minutes and more than 50% was on counseling and review of test results     Heath Lark, MD 11/30/2016 3:17 PM

## 2016-12-01 ENCOUNTER — Encounter (HOSPITAL_COMMUNITY)
Admission: RE | Admit: 2016-12-01 | Discharge: 2016-12-01 | Disposition: A | Discharge status: Home / Self Care | Payer: 59 | Source: Ambulatory Visit | Attending: Cardiology | Admitting: Cardiology

## 2016-12-01 ENCOUNTER — Other Ambulatory Visit: Payer: Self-pay | Admitting: Cardiology

## 2016-12-01 DIAGNOSIS — Z9889 Other specified postprocedural states: Secondary | ICD-10-CM

## 2016-12-04 ENCOUNTER — Encounter (HOSPITAL_COMMUNITY): Payer: 59

## 2016-12-04 ENCOUNTER — Telehealth (HOSPITAL_COMMUNITY): Payer: Self-pay | Admitting: Family Medicine

## 2016-12-06 ENCOUNTER — Encounter (HOSPITAL_COMMUNITY)
Admission: RE | Admit: 2016-12-06 | Discharge: 2016-12-06 | Disposition: A | Discharge status: Home / Self Care | Payer: 59 | Source: Ambulatory Visit | Attending: Cardiology | Admitting: Cardiology

## 2016-12-06 DIAGNOSIS — Z9889 Other specified postprocedural states: Secondary | ICD-10-CM

## 2016-12-08 ENCOUNTER — Encounter (HOSPITAL_COMMUNITY)
Admission: RE | Admit: 2016-12-08 | Discharge: 2016-12-08 | Disposition: A | Discharge status: Home / Self Care | Payer: 59 | Source: Ambulatory Visit | Attending: Cardiology | Admitting: Cardiology

## 2016-12-08 DIAGNOSIS — Z9889 Other specified postprocedural states: Secondary | ICD-10-CM

## 2016-12-11 ENCOUNTER — Encounter (HOSPITAL_COMMUNITY)
Admission: RE | Admit: 2016-12-11 | Discharge: 2016-12-11 | Disposition: A | Discharge status: Home / Self Care | Payer: 59 | Source: Ambulatory Visit | Attending: Cardiology | Admitting: Cardiology

## 2016-12-11 DIAGNOSIS — Z9889 Other specified postprocedural states: Secondary | ICD-10-CM

## 2016-12-13 ENCOUNTER — Encounter (HOSPITAL_COMMUNITY)
Admission: RE | Admit: 2016-12-13 | Discharge: 2016-12-13 | Disposition: A | Discharge status: Home / Self Care | Payer: 59 | Source: Ambulatory Visit | Attending: Cardiology | Admitting: Cardiology

## 2016-12-13 DIAGNOSIS — Z9889 Other specified postprocedural states: Secondary | ICD-10-CM | POA: Diagnosis not present

## 2016-12-15 ENCOUNTER — Encounter (HOSPITAL_COMMUNITY)
Admission: RE | Admit: 2016-12-15 | Discharge: 2016-12-15 | Disposition: A | Discharge status: Home / Self Care | Payer: 59 | Source: Ambulatory Visit | Attending: Cardiology | Admitting: Cardiology

## 2016-12-15 VITALS — Ht 68.0 in

## 2016-12-15 DIAGNOSIS — Z9889 Other specified postprocedural states: Secondary | ICD-10-CM

## 2016-12-15 DIAGNOSIS — Z952 Presence of prosthetic heart valve: Secondary | ICD-10-CM | POA: Diagnosis present

## 2016-12-15 NOTE — Progress Notes (Signed)
Cardiac Individual Treatment Plan  Patient Details  Name: David Carr. MRN: [id number] Date of Birth: 1945/09/29 Referring Provider:     CARDIAC REHAB PHASE II ORIENTATION from 09/28/2016 in [facility]  Referring Provider  Marijo File MD      Initial Encounter Date:    CARDIAC REHAB PHASE II ORIENTATION from 09/28/2016 in [facility]  Date  09/28/16  Referring Provider  Marijo File MD      Visit Diagnosis: 06/21/16 S/P mitral valve repair  Patient's Home Medications on Admission:  Current Outpatient Prescriptions:  .  acetaminophen (TYLENOL) 500 MG tablet, Take 2 tablets (1,000 mg total) by mouth every 6 (six) hours as needed., Disp: 30 tablet, Rfl: 0 .  aspirin 81 MG tablet, Take 81 mg by mouth daily with supper. , Disp: , Rfl:  .  atenolol (TENORMIN) 25 MG tablet, Take 0.5 tablets (12.5 mg total) by mouth daily., Disp: 100 tablet, Rfl: 4 .  Insulin Glargine (BASAGLAR KWIKPEN) 100 UNIT/ML SOPN, Inject 16 Units into the skin at bedtime., Disp: , Rfl:  .  metFORMIN (GLUCOPHAGE-XR) 500 MG 24 hr tablet, Take 2 tablets (1,000 mg total) by mouth daily with supper., Disp: 180 tablet, Rfl: 3 .  simvastatin (ZOCOR) 20 MG tablet, Take 1 tablet (20 mg total) by mouth at bedtime., Disp: 100 tablet, Rfl: 4 .  warfarin (COUMADIN) 5 MG tablet, Take 1 tablet by mouth daily or as directed.  Must schedule INR check for further refills, Disp: 30 tablet, Rfl: 0 No current facility-administered medications for this encounter.   Facility-Administered Medications Ordered in Other Encounters:  .  heparin lock flush 100 unit/mL, 500 Units, Intravenous, Once, Kale, Cloria Spring, MD .  sodium chloride 0.9 % injection 10 mL, 10 mL, Intravenous, PRN, Irene Limbo Cloria Spring, MD  Past Medical History: Past Medical History:  Diagnosis Date  . AML (acute myeloid leukemia) in remission [facility]) oncologist-  dr Alvy Bimler-- per last note 10/  2017 in remission 2 years   dx 09-11-2013  via bone marrow bx , FLT3 negative (NP M1 +)/  chemotherapy started 09-17-2013,  remission via marrow bx 11-06-2013,  4 cycles consolidation chemo w/ HiDAC 11-13-2013 to 03-05-2014  . BPH with urinary obstruction   . Chronic thrombocytopenic purpura ([place])   . Dental caries    periodontitis  . GERD (gastroesophageal reflux disease)   . Heart murmur   . History of kidney stones   . Hyperlipidemia   . Hypertension   . MVP (mitral valve prolapse)   . Pancytopenia, acquired ([place])   . Right ureteral stone   . 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 Severe mitral regurgitation   . Type 2 diabetes mellitus with hypoglycemia, with long-term current use of insulin ([facility]    endocrinologist-  dr gCon Memos . Wears denture    upper    Tobacco Use: History  Smoking Status  . Former Smoker  . Years: 20.00  . Types: Cigarettes  . Quit date: 04/18/1974  Smokeless Tobacco  . Never Used    Labs: Recent Review Flowsheet Data    Labs for ITP Cardiac and Pulmonary Rehab Latest Ref Rng & Units 06/21/2016 06/21/2016 06/22/2016 08/29/2016 11/07/2016   Cholestrol 0 - 200 mg/dL - - - - 156  LDLCALC 0 - 99 mg/dL - - - - 87   LDLDIRECT mg/dL - - - - -   HDL >39.00 mg/dL - - - - 47.90   Trlycerides 0.0 - 149.0 mg/dL - - - - 103.0   Hemoglobin A1c 4.6 - 6.5 % - - - 6.6 7.5(H)   PHART 7.350 - 7.450 - 7.404 - - -   PCO2ART 32.0 - 48.0 mmHg - 40.9 - - -   HCO3 20.0 - 28.0 mmol/L - 25.4 - - -   TCO2 0 - 100 mmol/L _0 - -   O2SAT % - 98.0 63.3 - -      Capillary Blood Glucose: Lab Results  Component Value Date   GLUCAP 127 (H) 10/25/2016   GLUCAP 134 (H) 10/06/2016   GLUCAP 160 (H) 10/06/2016   GLUCAP 117 (H) 10/04/2016   GLUCAP 174  (H) 10/04/2016     Exercise Target Goals:    Exercise Program Goal: Individual exercise prescription set with THRR, safety & activity barriers. Participant demonstrates ability to understand and report RPE using BORG scale, to self-measure pulse accurately, and to acknowledge the importance of the exercise prescription.  Exercise Prescription Goal: Starting with aerobic activity 30 plus minutes a day, 3 days per week for initial exercise prescription. Provide home exercise prescription and guidelines that participant acknowledges understanding prior to discharge.  Activity Barriers & Risk Stratification:     Activity Barriers & Cardiac Risk Stratification - 09/28/16 0852      Activity Barriers & Cardiac Risk Stratification   Activity Barriers Muscular Weakness;Deconditioning;Other (comment)   Comments R RTCx2 8 yrs ago; R elbow surgery and hernia surgery 20 years ago   Cardiac Risk Stratification High      6 Minute Walk:     [place] Name 09/28/16 1038 12/15/16 1029       6 Minute Walk   Phase Initial Discharge    Distance 1948 feet 1921 feet    Distance % Change  - 1.39 %    Walk Time 6 minutes 6 minutes    # of Rest Breaks 0 0    MPH 3.67 3.64    METS 3.92 4.06    RPE 13 11    VO2 Peak 13.73 14.21    Symptoms No No    Resting HR 96 bpm 98 bpm    Resting BP 108/70 118/80    Max Ex. HR 108 bpm 124 bpm    Max Ex. BP 140/80 142/82    2 Minute Post BP 120/84  -       Oxygen Initial Assessment:   Oxygen Re-Evaluation:   Oxygen Discharge (Final Oxygen Re-Evaluation):   Initial Exercise Prescription:     Initial Exercise Prescription - 09/28/16 1000      Date of Initial Exercise RX and Referring Provider   Date 09/28/16   Referring Provider Marijo File MD     Treadmill   MPH 3   Grade 1   Minutes 10   METs 3.71     Bike   Level 1   Minutes 10   METs 3.22     NuStep   Level 3   SPM 80   Minutes 10   METs 2.5      Prescription Details   Frequency (times per week) 3   Duration Progress to 30 minutes of continuous aerobic without signs/symptoms of physical distress     Intensity   THRR  40-80% of Max Heartrate 59-118   Ratings of Perceived Exertion 11-13   Perceived Dyspnea 0-4     Progression   Progression Continue to progress workloads to maintain intensity without signs/symptoms of physical distress.     Resistance Training   Training Prescription Yes   Weight 3lbs   Reps 10-15      Perform Capillary Blood Glucose checks as needed.  Exercise Prescription Changes:      Exercise Prescription Changes    Row Name 10/09/16 1700 10/24/16 1100 11/06/16 1800 11/20/16 1500 12/06/16 0947     Response to Exercise   Blood Pressure (Admit) 120/60 114/64 124/62 104/66 120/82   Blood Pressure (Exercise) 116/54 128/72 136/70 142/80 132/80   Blood Pressure (Exit) 102/78 108/64 104/60 104/60 98/60   Heart Rate (Admit) 90 bpm 84 bpm 92 bpm 91 bpm 97 bpm   Heart Rate (Exercise) 113 bpm 117 bpm 116 bpm 116 bpm 99 bpm   Heart Rate (Exit) 100 bpm 89 bpm 94 bpm 102 bpm 95 bpm   Rating of Perceived Exertion (Exercise) _0 Symptoms  - none none none none   Duration Continue with 30 min of aerobic exercise without signs/symptoms of physical distress. Continue with 30 min of aerobic exercise without signs/symptoms of physical distress. Continue with 30 min of aerobic exercise without signs/symptoms of physical distress. Continue with 30 min of aerobic exercise without signs/symptoms of physical distress. Continue with 30 min of aerobic exercise without signs/symptoms of physical distress.   Intensity _1      Progression   Progression Continue to progress workloads to maintain intensity without signs/symptoms of physical distress. Continue to progress workloads to maintain intensity without signs/symptoms of physical distress.  Continue to progress workloads to maintain intensity without signs/symptoms of physical distress. Continue to progress workloads to maintain intensity without signs/symptoms of physical distress. Continue to progress workloads to maintain intensity without signs/symptoms of physical distress.   Average METs 3.4 4 4.1 4.1 3.8     Resistance Training   Training Prescription _2    Weight 3lbs 4lbs 4lbs 4lbs 5lbs   Reps 10-15 10-15 10-15 10-15 10-15   Time  - 10 Minutes 10 Minutes 10 Minutes 10 Minutes     Interval Training   Interval Training _3      Treadmill   MPH 3.2 3.2 3.2 3.4 -   Grade _4 -   Minutes _5 -   METs 4.33 4.33 4.33 4.54 -     Bike   Level 1 1.5 1.5  -  -   Minutes _6 -  -   METs 3.13 4.23 4.21  -  -     NuStep   Level _7 SPM 80 80 80 85 85   Minutes _8 METs 2.7 3.3 3.8 4.3 4.1     Arm Ergometer   Level  -  -  - 7 7   Minutes  -  -  - 10 10   METs  -  -  - 3.5 3.5     Home Exercise Plan   Plans to continue exercise at  - Home (comment) Home (comment) Home (comment) Home (comment)   Frequency  - Add 4 additional days to program exercise sessions. Add 4 additional days  to program exercise sessions. Add 4 additional days to program exercise sessions. Add 4 additional days to program exercise sessions.   Initial Home Exercises Provided  - 10/18/16 10/18/16 10/18/16 10/18/16      Exercise Comments:      Exercise Comments    Row Name 10/18/16 1819 10/25/16 1201 11/08/16 1111 11/22/16 0945 12/06/16 0945   Exercise Comments Reviewed home exercise guidelines and METs. Reviewed METs and goals with patient. Reviewed METs with patient. Reviewed METs and goals with patient. Patient is highly motivated and doing well with exercise. Oriented patient to weight machines. Reviewed METs and goals.      Exercise Goals and Review:      Exercise Goals    Row Name 09/28/16 0857              Exercise Goals   Increase Physical Activity Yes       Intervention Provide advice, education, support and counseling about physical activity/exercise needs.;Develop an individualized exercise prescription for aerobic and resistive training based on initial evaluation findings, risk stratification, comorbidities and participant's personal goals.       Expected Outcomes Achievement of increased cardiorespiratory fitness and enhanced flexibility, muscular endurance and strength shown through measurements of functional capacity and personal statement of participant.       Increase Strength and Stamina Yes       Intervention Provide advice, education, support and counseling about physical activity/exercise needs.;Develop an individualized exercise prescription for aerobic and resistive training based on initial evaluation findings, risk stratification, comorbidities and participant's personal goals.       Expected Outcomes Achievement of increased cardiorespiratory fitness and enhanced flexibility, muscular endurance and strength shown through measurements of functional capacity and personal statement of participant.          Exercise Goals Re-Evaluation :     Exercise Goals Re-Evaluation    Row Name 10/25/16 1201 11/22/16 0945 12/06/16 0945         Exercise Goal Re-Evaluation   Exercise Goals Review Increase Physical Activity;Able to understand and use rate of perceived exertion (RPE) scale;Increase Strength and Stamina;Knowledge and understanding of Target Heart Rate Range (THRR) Increase Physical Activity;Knowledge and understanding of Target Heart Rate Range (THRR) Increase Strength and Stamina     Comments Pt is doing well with exercise. Pt feels strength and stamina overall is improving but would like to work on upper body strength. Pt requested to switch from stationary bike to arm ergometer. Pt will increase hand weigths from 4lbs to 5 lbs. Patient is making excellent progress with exercise,  increasing workloads consistently. Reviewed THRR with patient. Pt is walking 1.5 miles daily. Patient has been doing well on the arm ergometer. Oriented patient to weight machines today.     Expected Outcomes Continue walking daily at home. Move to arm ergometer to improve upper body strength. Increase workloads as tolerated to achieve fitness and weight loss goals. Patient will continue daily walking routine at home in addition to progressing workloads at cardiac rehab to help increase strength and overall fitness. Begin weight training to help achieve goal of increases upper body strength.         Discharge Exercise Prescription (Final Exercise Prescription Changes):     Exercise Prescription Changes - 12/06/16 0947      Response to Exercise   Blood Pressure (Admit) 120/82   Blood Pressure (Exercise) 132/80   Blood Pressure (Exit) 98/60   Heart Rate (Admit) 97 bpm   Heart Rate (Exercise) 99 bpm  Heart Rate (Exit) 95 bpm   Rating of Perceived Exertion (Exercise) 11   Symptoms none   Duration Continue with 30 min of aerobic exercise without signs/symptoms of physical distress.   Intensity THRR unchanged     Progression   Progression Continue to progress workloads to maintain intensity without signs/symptoms of physical distress.   Average METs 3.8     Resistance Training   Training Prescription Yes   Weight 5lbs   Reps 10-15   Time 10 Minutes     Interval Training   Interval Training No     Treadmill   MPH --   Grade --   Minutes --   METs --     NuStep   Level 7   SPM 85   Minutes 10   METs 4.1     Arm Ergometer   Level 7   Minutes 10   METs 3.5     Home Exercise Plan   Plans to continue exercise at Home (comment)   Frequency Add 4 additional days to program exercise sessions.   Initial Home Exercises Provided 10/18/16      Nutrition:  Target Goals: Understanding of nutrition guidelines, daily intake of sodium <1555m, cholesterol <2059m calories 30%  from fat and 7% or less from saturated fats, daily to have 5 or more servings of fruits and vegetables.  Biometrics:     Pre Biometrics - 09/28/16 1526      Pre Biometrics   Height _0  (1.727 m)   Weight 192 lb 14.4 oz (87.5 kg)   Waist Circumference 43 inches   Hip Circumference 39.5 inches   Waist to Hip Ratio 1.09 %   BMI (Calculated) 29.4   Triceps Skinfold 22 mm   % Body Fat 31.3 %   Grip Strength 39 kg   Flexibility 9 in   Single Leg Stand 30 seconds         Post Biometrics - 12/15/16 1029       Post  Biometrics   Height _1  (1.727 m)   Waist Circumference 40.5 inches   Hip Circumference 40.75 inches   Waist to Hip Ratio 0.99 %   Triceps Skinfold 20 mm   Grip Strength 43.5 kg   Flexibility 14 in   Single Leg Stand 30 seconds      Nutrition Therapy Plan and Nutrition Goals:     Nutrition Therapy & Goals - 10/04/16 1044      Nutrition Therapy   Diet Carb Modified, Therapeutic Lifestyle Changes   Drug/Food Interactions Coumadin/Vit K     Personal Nutrition Goals   Nutrition Goal Pt to identify and limit food sources of saturated fat, trans fat, and sodium   Personal Goal #2 Pt to identify food quantities necessary to achieve weight loss of 6-20 lb at graduation from cardiac rehab. Goal wt of 175 lb desired.    Personal Goal #3 Pt able to name foods rich in vitamin K. (Pt taking Coumadin/Warfarin).     Intervention Plan   Intervention Prescribe, educate and counsel regarding individualized specific dietary modifications aiming towards targeted core components such as weight, hypertension, lipid management, diabetes, heart failure and other comorbidities.   Expected Outcomes Short Term Goal: Understand basic principles of dietary content, such as calories, fat, sodium, cholesterol and nutrients.;Long Term Goal: Adherence to prescribed nutrition plan.      Nutrition Discharge: Nutrition Scores:     Nutrition Assessments - 10/04/16 1044      MEDFICTS  Scores   Pre Score 59      Nutrition Goals Re-Evaluation:   Nutrition Goals Re-Evaluation:   Nutrition Goals Discharge (Final Nutrition Goals Re-Evaluation):   Psychosocial: Target Goals: Acknowledge presence or absence of significant depression and/or stress, maximize coping skills, provide positive support system. Participant is able to verbalize types and ability to use techniques and skills needed for reducing stress and depression.  Initial Review & Psychosocial Screening:     Initial Psych Review & Screening - 09/28/16 1523      Initial Review   Current issues with None Identified     Family Dynamics   Good Support System? Yes     Barriers   Psychosocial barriers to participate in program There are no identifiable barriers or psychosocial needs.     Screening Interventions   Interventions Encouraged to exercise      Quality of Life Scores:     Quality of Life - 09/28/16 1034      Quality of Life Scores   Health/Function Pre 23.17 %   Socioeconomic Pre 25 %   Psych/Spiritual Pre 21.71 %   Family Pre 27 %   GLOBAL Pre 23.81 %      PHQ-9: Recent Review Flowsheet Data    Depression screen [facility] 2/9 10/02/2016 10/26/2015 10/20/2014   Decreased Interest 0 0 0   Down, Depressed, Hopeless 0 0 0   PHQ - 2 Score 0 0 0     Interpretation of Total Score  Total Score Depression Severity:  1-4 = Minimal depression, 5-9 = Mild depression, 10-14 = Moderate depression, 15-19 = Moderately severe depression, 20-27 = Severe depression   Psychosocial Evaluation and Intervention:   Psychosocial Re-Evaluation:     Psychosocial Re-Evaluation    Row Name 10/26/16 1001 11/22/16 1620 12/15/16 1219         Psychosocial Re-Evaluation   Current issues with None Identified None Identified None Identified     Interventions Encouraged to attend Cardiac Rehabilitation for the exercise Encouraged to attend Cardiac Rehabilitation for the exercise Encouraged to attend Cardiac  Rehabilitation for the exercise     Continue Psychosocial Services  No Follow up required No Follow up required No Follow up required        Psychosocial Discharge (Final Psychosocial Re-Evaluation):     Psychosocial Re-Evaluation - 12/15/16 1219      Psychosocial Re-Evaluation   Current issues with None Identified   Interventions Encouraged to attend Cardiac Rehabilitation for the exercise   Continue Psychosocial Services  No Follow up required      Vocational Rehabilitation: Provide vocational rehab assistance to qualifying candidates.   Vocational Rehab Evaluation & Intervention:     Vocational Rehab - 09/28/16 1522      Initial Vocational Rehab Evaluation & Intervention   Assessment shows need for Vocational Rehabilitation No      Education: Education Goals: Education classes will be provided on a weekly basis, covering required topics. Participant will state understanding/return demonstration of topics presented.  Learning Barriers/Preferences:     Learning Barriers/Preferences - 09/28/16 0851      Learning Barriers/Preferences   Learning Barriers Sight   Learning Preferences Video;Skilled Demonstration;Pictoral      Education Topics: Count Your Pulse:  -Group instruction provided by verbal instruction, demonstration, patient participation and written materials to support subject.  Instructors address importance of being able to find your pulse and how to count your pulse when at home without a heart monitor.  Patients get hands on experience  counting their pulse with staff help and individually.   Heart Attack, Angina, and Risk Factor Modification:  -Group instruction provided by verbal instruction, video, and written materials to support subject.  Instructors address signs and symptoms of angina and heart attacks.    Also discuss risk factors for heart disease and how to make changes to improve heart health risk factors.   Functional Fitness:  -Group  instruction provided by verbal instruction, demonstration, patient participation, and written materials to support subject.  Instructors address safety measures for doing things around the house.  Discuss how to get up and down off the floor, how to pick things up properly, how to safely get out of a chair without assistance, and balance training.   Meditation and Mindfulness:  -Group instruction provided by verbal instruction, patient participation, and written materials to support subject.  Instructor addresses importance of mindfulness and meditation practice to help reduce stress and improve awareness.  Instructor also leads participants through a meditation exercise.    Stretching for Flexibility and Mobility:  -Group instruction provided by verbal instruction, patient participation, and written materials to support subject.  Instructors lead participants through series of stretches that are designed to increase flexibility thus improving mobility.  These stretches are additional exercise for major muscle groups that are typically performed during regular warm up and cool down.   Hands Only CPR:  -Group verbal, video, and participation provides a basic overview of AHA guidelines for community CPR. Role-play of emergencies allow participants the opportunity to practice calling for help and chest compression technique with discussion of AED use.   Hypertension: -Group verbal and written instruction that provides a basic overview of hypertension including the most recent diagnostic guidelines, risk factor reduction with self-care instructions and medication management.    Nutrition I class: Heart Healthy Eating:  -Group instruction provided by PowerPoint slides, verbal discussion, and written materials to support subject matter. The instructor gives an explanation and review of the Therapeutic Lifestyle Changes diet recommendations, which includes a discussion on lipid goals, dietary fat,  sodium, fiber, plant stanol/sterol esters, sugar, and the components of a well-balanced, healthy diet.   Nutrition II class: Lifestyle Skills:  -Group instruction provided by PowerPoint slides, verbal discussion, and written materials to support subject matter. The instructor gives an explanation and review of label reading, grocery shopping for heart health, heart healthy recipe modifications, and ways to make healthier choices when eating out.   Diabetes Question & Answer:  -Group instruction provided by PowerPoint slides, verbal discussion, and written materials to support subject matter. The instructor gives an explanation and review of diabetes co-morbidities, pre- and post-prandial blood glucose goals, pre-exercise blood glucose goals, signs, symptoms, and treatment of hypoglycemia and hyperglycemia, and foot care basics.   Diabetes Blitz:  -Group instruction provided by PowerPoint slides, verbal discussion, and written materials to support subject matter. The instructor gives an explanation and review of the physiology behind type 1 and type 2 diabetes, diabetes medications and rational behind using different medications, pre- and post-prandial blood glucose recommendations and Hemoglobin A1c goals, diabetes diet, and exercise including blood glucose guidelines for exercising safely.    Portion Distortion:  -Group instruction provided by PowerPoint slides, verbal discussion, written materials, and food models to support subject matter. The instructor gives an explanation of serving size versus portion size, changes in portions sizes over the last 20 years, and what consists of a serving from each food group.   Stress Management:  -Group instruction provided by verbal  instruction, video, and written materials to support subject matter.  Instructors review role of stress in heart disease and how to cope with stress positively.     Exercising on Your Own:  -Group instruction provided by  verbal instruction, power point, and written materials to support subject.  Instructors discuss benefits of exercise, components of exercise, frequency and intensity of exercise, and end points for exercise.  Also discuss use of nitroglycerin and activating EMS.  Review options of places to exercise outside of rehab.  Review guidelines for sex with heart disease.   Cardiac Drugs I:  -Group instruction provided by verbal instruction and written materials to support subject.  Instructor reviews cardiac drug classes: antiplatelets, anticoagulants, beta blockers, and statins.  Instructor discusses reasons, side effects, and lifestyle considerations for each drug class.   Cardiac Drugs II:  -Group instruction provided by verbal instruction and written materials to support subject.  Instructor reviews cardiac drug classes: angiotensin converting enzyme inhibitors (ACE-I), angiotensin II receptor blockers (ARBs), nitrates, and calcium channel blockers.  Instructor discusses reasons, side effects, and lifestyle considerations for each drug class.   Anatomy and Physiology of the Circulatory System:  Group verbal and written instruction and models provide basic cardiac anatomy and physiology, with the coronary electrical and arterial systems. Review of: AMI, Angina, Valve disease, Heart Failure, Peripheral Artery Disease, Cardiac Arrhythmia, Pacemakers, and the ICD.   Other Education:  -Group or individual verbal, written, or video instructions that support the educational goals of the cardiac rehab program.   Knowledge Questionnaire Score:     Knowledge Questionnaire Score - 09/28/16 1031      Knowledge Questionnaire Score   Pre Score 17/24      Core Components/Risk Factors/Patient Goals at Admission:     Personal Goals and Risk Factors at Admission - 09/28/16 1035      Core Components/Risk Factors/Patient Goals on Admission    Weight Management Yes;Weight Maintenance;Weight Loss    Intervention Weight Management: Develop a combined nutrition and exercise program designed to reach desired caloric intake, while maintaining appropriate intake of nutrient and fiber, sodium and fats, and appropriate energy expenditure required for the weight goal.;Weight Management: Provide education and appropriate resources to help participant work on and attain dietary goals.;Weight Management/Obesity: Establish reasonable short term and long term weight goals.   Expected Outcomes Short Term: Continue to assess and modify interventions until short term weight is achieved;Long Term: Adherence to nutrition and physical activity/exercise program aimed toward attainment of established weight goal;Weight Maintenance: Understanding of the daily nutrition guidelines, which includes 25-35% calories from fat, 7% or less cal from saturated fats, less than 273m cholesterol, less than 1.5gm of sodium, & 5 or more servings of fruits and vegetables daily;Weight Loss: Understanding of general recommendations for a balanced deficit meal plan, which promotes 1-2 lb weight loss per week and includes a negative energy balance of [phone] kcal/d;Understanding recommendations for meals to include 15-35% energy as protein, 25-35% energy from fat, 35-60% energy from carbohydrates, less than 2026mof dietary cholesterol, 20-35 gm of total fiber daily;Understanding of distribution of calorie intake throughout the day with the consumption of 4-5 meals/snacks   Diabetes Yes   Intervention Provide education about signs/symptoms and action to take for hypo/hyperglycemia.;Provide education about proper nutrition, including hydration, and aerobic/resistive exercise prescription along with prescribed medications to achieve blood glucose in normal ranges: Fasting glucose 65-99 mg/dL   Expected Outcomes Long Term: Attainment of HbA1C < 7%.;Short Term: Participant verbalizes understanding of the signs/symptoms and  immediate care of  hyper/hypoglycemia, proper foot care and importance of medication, aerobic/resistive exercise and nutrition plan for blood glucose control.   Hypertension Yes   Intervention Provide education on lifestyle modifcations including regular physical activity/exercise, weight management, moderate sodium restriction and increased consumption of fresh fruit, vegetables, and low fat dairy, alcohol moderation, and smoking cessation.;Monitor prescription use compliance.   Expected Outcomes Short Term: Continued assessment and intervention until BP is < 140/28m HG in hypertensive participants. < 130/821mHG in hypertensive participants with diabetes, heart failure or chronic kidney disease.;Long Term: Maintenance of blood pressure at goal levels.   Lipids Yes   Intervention Provide education and support for participant on nutrition & aerobic/resistive exercise along with prescribed medications to achieve LDL <7066mHDL >33m52m Expected Outcomes Short Term: Participant states understanding of desired cholesterol values and is compliant with medications prescribed. Participant is following exercise prescription and nutrition guidelines.;Long Term: Cholesterol controlled with medications as prescribed, with individualized exercise RX and with personalized nutrition plan. Value goals: LDL < 70mg67mL > 40 mg.   Stress Yes   Intervention Offer individual and/or small group education and counseling on adjustment to heart disease, stress management and health-related lifestyle change. Teach and support self-help strategies.;Refer participants experiencing significant psychosocial distress to appropriate mental health specialists for further evaluation and treatment. When possible, include family members and significant others in education/counseling sessions.   Expected Outcomes Short Term: Participant demonstrates changes in health-related behavior, relaxation and other stress management skills, ability to obtain effective  social support, and compliance with psychotropic medications if prescribed.;Long Term: Emotional wellbeing is indicated by absence of clinically significant psychosocial distress or social isolation.      Core Components/Risk Factors/Patient Goals Review:      Goals and Risk Factor Review    Row Name 10/26/16 0956 10/26/16 1000 11/22/16 1620 12/15/16 1219       Core Components/Risk Factors/Patient Goals Review   Personal Goals Review Weight Management/Obesity;Lipids;Hypertension;Diabetes  - Weight Management/Obesity;Lipids;Hypertension;Diabetes Weight Management/Obesity;Lipids;Hypertension;Diabetes    Review Damarian's vital signs and home CBG's have been stalbe. WalteJaxoing well with exercise  - Leron's vital signs and home CBG's have been stalbe. WalteMarquizeoing well with exercise Aizen's vital signs and home CBG's have been stalbe. WalteTurneroing well with exercise    Expected Outcomes WalteChandan continue to exercise at cardiac rehab, take his presribed medications for  WalteZaahir continue to exercise at cardiac rehab, take his presribed medications for HTN,Hyperlipidemia and diabetes WalteBently continue to exercise at cardiac rehab, take his presribed medications for HTN,Hyperlipidemia and diabetes WalteLyfe continue to exercise at cardiac rehab, take his presribed medications for HTN,Hyperlipidemia and diabetes       Core Components/Risk Factors/Patient Goals at Discharge (Final Review):      Goals and Risk Factor Review - 12/15/16 1219      Core Components/Risk Factors/Patient Goals Review   Personal Goals Review Weight Management/Obesity;Lipids;Hypertension;Diabetes   Review Antowan's vital signs and home CBG's have been stalbe. WalteCornelloing well with exercise   Expected Outcomes WalteAaden continue to exercise at cardiac rehab, take his presribed medications for HTN,Hyperlipidemia and diabetes      ITP Comments:     ITP Comments    Row Name 09/28/16 0846            ITP Comments Dr. TraciFransico Himical Director          Comments: WalteNikolusaking expected progress toward personal goals after  completing 32 sessions. Recommend continued exercise and life style modification education including  stress management and relaxation techniques to decrease cardiac risk profile. Keonta continues to do well with exercise.Barnet Pall, RN,BSN 12/15/2016 4:50 PM

## 2016-12-18 ENCOUNTER — Encounter (HOSPITAL_COMMUNITY)
Admission: RE | Admit: 2016-12-18 | Discharge: 2016-12-18 | Disposition: A | Discharge status: Home / Self Care | Payer: 59 | Source: Ambulatory Visit | Attending: Cardiology | Admitting: Cardiology

## 2016-12-18 DIAGNOSIS — Z9889 Other specified postprocedural states: Secondary | ICD-10-CM

## 2016-12-18 DIAGNOSIS — Z952 Presence of prosthetic heart valve: Secondary | ICD-10-CM | POA: Diagnosis not present

## 2016-12-20 ENCOUNTER — Encounter (HOSPITAL_COMMUNITY)
Admission: RE | Admit: 2016-12-20 | Discharge: 2016-12-20 | Disposition: A | Discharge status: Home / Self Care | Payer: 59 | Source: Ambulatory Visit | Attending: Cardiology | Admitting: Cardiology

## 2016-12-20 DIAGNOSIS — Z9889 Other specified postprocedural states: Secondary | ICD-10-CM

## 2016-12-20 DIAGNOSIS — Z952 Presence of prosthetic heart valve: Secondary | ICD-10-CM | POA: Diagnosis not present

## 2016-12-22 ENCOUNTER — Encounter (HOSPITAL_COMMUNITY)
Admission: RE | Admit: 2016-12-22 | Discharge: 2016-12-22 | Disposition: A | Discharge status: Home / Self Care | Payer: 59 | Source: Ambulatory Visit | Attending: Cardiology | Admitting: Cardiology

## 2016-12-22 DIAGNOSIS — Z952 Presence of prosthetic heart valve: Secondary | ICD-10-CM | POA: Diagnosis not present

## 2016-12-22 DIAGNOSIS — Z9889 Other specified postprocedural states: Secondary | ICD-10-CM

## 2016-12-25 ENCOUNTER — Encounter (HOSPITAL_COMMUNITY)
Admission: RE | Admit: 2016-12-25 | Discharge: 2016-12-25 | Disposition: A | Discharge status: Home / Self Care | Payer: 59 | Source: Ambulatory Visit | Attending: Cardiology | Admitting: Cardiology

## 2016-12-25 DIAGNOSIS — Z952 Presence of prosthetic heart valve: Secondary | ICD-10-CM | POA: Diagnosis not present

## 2016-12-25 DIAGNOSIS — Z9889 Other specified postprocedural states: Secondary | ICD-10-CM

## 2016-12-27 ENCOUNTER — Encounter (HOSPITAL_COMMUNITY)
Admission: RE | Admit: 2016-12-27 | Discharge: 2016-12-27 | Disposition: A | Discharge status: Home / Self Care | Payer: 59 | Source: Ambulatory Visit | Attending: Cardiology | Admitting: Cardiology

## 2016-12-27 VITALS — BP 118/78 | HR 89 | Ht 68.0 in | Wt 192.9 lb

## 2016-12-27 DIAGNOSIS — Z9889 Other specified postprocedural states: Secondary | ICD-10-CM

## 2016-12-27 DIAGNOSIS — Z952 Presence of prosthetic heart valve: Secondary | ICD-10-CM | POA: Diagnosis not present

## 2016-12-27 NOTE — Progress Notes (Signed)
Discharge Progress Report  Patient Details  Name: David Carr. MRN: [id number] Date of Birth: 1945/12/30 Referring Provider:     CARDIAC REHAB PHASE II ORIENTATION from 09/28/2016 in [place]  Referring Provider  Marijo File MD       Number of Visits: 36  Reason for Discharge:  Patient independent in their exercise.  Smoking History:  Social History   Tobacco Use  Smoking Status Former Smoker  . Years: 20.00  . Types: Cigarettes  . Last attempt to quit: 04/18/1974  . 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/16 S/P mitral valve repair  ADL [facility]:   Initial Exercise Prescription: Initial Exercise Prescription - 09/28/16 1000      Date of Initial Exercise RX and Referring Provider   Date  09/28/16    Referring Provider  Marijo File MD      Treadmill   MPH  3    Grade  1    Minutes  10    METs  3.71      Bike   Level  1    Minutes  10    METs  3.22      NuStep   Level  3    SPM  80    Minutes  10    METs  2.5      Prescription Details   Frequency (times per week)  3    Duration  Progress to 30 minutes of continuous aerobic without signs/symptoms of physical distress      Intensity   THRR 40-80% of Max Heartrate  59-118    Ratings of Perceived Exertion  11-13    Perceived Dyspnea  0-4      Progression   Progression  Continue to progress workloads to maintain intensity without signs/symptoms of physical distress.      Resistance Training   Training Prescription  Yes    Weight  3lbs    Reps  10-15       Discharge Exercise Prescription (Final Exercise Prescription Changes): Exercise Prescription Changes - 12/06/16 0947      Response to Exercise   Blood Pressure (Admit)  120/82    Blood Pressure (Exercise)  132/80    Blood Pressure (Exit)  98/60    Heart Rate (Admit)  97 bpm    Heart Rate (Exercise)  99 bpm    Heart Rate (Exit)  95 bpm    Rating of Perceived  Exertion (Exercise)  11    Symptoms  none    Duration  Continue with 30 min of aerobic exercise without signs/symptoms of physical distress.    Intensity  THRR unchanged      Progression   Progression  Continue to progress workloads to maintain intensity without signs/symptoms of physical distress.    Average METs  3.8      Resistance Training   Training Prescription  Yes    Weight  5lbs    Reps  10-15    Time  10 Minutes      Interval Training   Interval Training  No      Treadmill   MPH  --    Grade  --    Minutes  --    METs  --      NuStep   Level  7    SPM  85    Minutes  10    METs  4.1  Arm Ergometer   Level  7    Minutes  10    METs  3.5      Home Exercise Plan   Plans to continue exercise at  Home (comment)    Frequency  Add 4 additional days to program exercise sessions.    Initial Home Exercises Provided  10/18/16       Functional Capacity: 6 Minute Walk    Row Name 09/28/16 1038 12/15/16 1029       6 Minute Walk   Phase  Initial  Discharge    Distance  1948 feet  1921 feet    Distance % Change  -  1.39 %    Walk Time  6 minutes  6 minutes    # of Rest Breaks  0  0    MPH  3.67  3.64    METS  3.92  4.06    RPE  13  11    VO2 Peak  13.73  14.21    Symptoms  No  No    Resting HR  96 bpm  98 bpm    Resting BP  108/70  118/80    Max Ex. HR  108 bpm  124 bpm    Max Ex. BP  140/80  142/82    2 Minute Post BP  120/84  -       Psychological, QOL, Others - Outcomes: PHQ 2/9: Depression screen [facility] 2/9 12/27/2016 10/02/2016 10/26/2015 10/20/2014  Decreased Interest 0 0 0 0  Down, Depressed, Hopeless 0 0 0 0  PHQ - 2 Score 0 0 0 0  Some recent data might be hidden    Quality of Life: Quality of Life - 12/27/16 0942      Quality of Life Scores   Health/Function Pre  23.17 %    Health/Function Post  25.27 %    Health/Function % Change  9.06 %    Socioeconomic Pre  25 %    Socioeconomic Post  24.29 %    Socioeconomic % Change   -2.84 %     Psych/Spiritual Pre  21.71 %    Psych/Spiritual Post  24.29 %    Psych/Spiritual % Change  11.88 %    Family Pre  27 %    Family Post  26.1 %    Family % Change  -3.33 %    GLOBAL Pre  23.81 %    GLOBAL Post  24.99 %    GLOBAL % Change  4.96 %       Personal Goals: Goals established at orientation with interventions provided to work toward goal. Personal Goals and Risk Factors at Admission - 09/28/16 1035      Core Components/Risk Factors/Patient Goals on Admission    Weight Management  Yes;Weight Maintenance;Weight Loss    Intervention  Weight Management: Develop a combined nutrition and exercise program designed to reach desired caloric intake, while maintaining appropriate intake of nutrient and fiber, sodium and fats, and appropriate energy expenditure required for the weight goal.;Weight Management: Provide education and appropriate resources to help participant work on and attain dietary goals.;Weight Management/Obesity: Establish reasonable short term and long term weight goals.    Expected Outcomes  Short Term: Continue to assess and modify interventions until short term weight is achieved;Long Term: Adherence to nutrition and physical activity/exercise program aimed toward attainment of established weight goal;Weight Maintenance: Understanding of the daily nutrition guidelines, which includes 25-35% calories from fat, 7% or less cal from saturated fats,  less than '200mg'$  cholesterol, less than 1.5gm of sodium, & 5 or more servings of fruits and vegetables daily;Weight Loss: Understanding of general recommendations for a balanced deficit meal plan, which promotes 1-2 lb weight loss per week and includes a negative energy balance of [phone] kcal/d;Understanding recommendations for meals to include 15-35% energy as protein, 25-35% energy from fat, 35-60% energy from carbohydrates, less than '200mg'$  of dietary cholesterol, 20-35 gm of total fiber daily;Understanding of distribution of calorie  intake throughout the day with the consumption of 4-5 meals/snacks    Diabetes  Yes    Intervention  Provide education about signs/symptoms and action to take for hypo/hyperglycemia.;Provide education about proper nutrition, including hydration, and aerobic/resistive exercise prescription along with prescribed medications to achieve blood glucose in normal ranges: Fasting glucose 65-99 mg/dL    Expected Outcomes  Long Term: Attainment of HbA1C < 7%.;Short Term: Participant verbalizes understanding of the signs/symptoms and immediate care of hyper/hypoglycemia, proper foot care and importance of medication, aerobic/resistive exercise and nutrition plan for blood glucose control.    Hypertension  Yes    Intervention  Provide education on lifestyle modifcations including regular physical activity/exercise, weight management, moderate sodium restriction and increased consumption of fresh fruit, vegetables, and low fat dairy, alcohol moderation, and smoking cessation.;Monitor prescription use compliance.    Expected Outcomes  Short Term: Continued assessment and intervention until BP is < 140/49m HG in hypertensive participants. < 130/877mHG in hypertensive participants with diabetes, heart failure or chronic kidney disease.;Long Term: Maintenance of blood pressure at goal levels.    Lipids  Yes    Intervention  Provide education and support for participant on nutrition & aerobic/resistive exercise along with prescribed medications to achieve LDL '70mg'$ , HDL >'40mg'$ .    Expected Outcomes  Short Term: Participant states understanding of desired cholesterol values and is compliant with medications prescribed. Participant is following exercise prescription and nutrition guidelines.;Long Term: Cholesterol controlled with medications as prescribed, with individualized exercise RX and with personalized nutrition plan. Value goals: LDL < '70mg'$ , HDL > 40 mg.    Stress  Yes    Intervention  Offer individual and/or small  group education and counseling on adjustment to heart disease, stress management and health-related lifestyle change. Teach and support self-help strategies.;Refer participants experiencing significant psychosocial distress to appropriate mental health specialists for further evaluation and treatment. When possible, include family members and significant others in education/counseling sessions.    Expected Outcomes  Short Term: Participant demonstrates changes in health-related behavior, relaxation and other stress management skills, ability to obtain effective social support, and compliance with psychotropic medications if prescribed.;Long Term: Emotional wellbeing is indicated by absence of clinically significant psychosocial distress or social isolation.        Personal Goals Discharge: Goals and Risk Factor Review    Row Name 10/26/16 0956 10/26/16 1000 11/22/16 1620 12/15/16 1219       Core Components/Risk Factors/Patient Goals Review   Personal Goals Review  Weight Management/Obesity;Lipids;Hypertension;Diabetes  -  Weight Management/Obesity;Lipids;Hypertension;Diabetes  Weight Management/Obesity;Lipids;Hypertension;Diabetes    Review  Jaymie's vital signs and home CBG's have been stalbe. WaPoseidons doing well with exercise  -  Trevaughn's vital signs and home CBG's have been stalbe. WaDamins doing well with exercise  Jaideep's vital signs and home CBG's have been stalbe. WaRaiquans doing well with exercise    Expected Outcomes  WaSoulill continue to exercise at cardiac rehab, take his presribed medications for   WaBernisill continue to exercise at cardiac rehab, take  his presribed medications for HTN,Hyperlipidemia and diabetes  Tannar will continue to exercise at cardiac rehab, take his presribed medications for HTN,Hyperlipidemia and diabetes  Quantrell will continue to exercise at cardiac rehab, take his presribed medications for HTN,Hyperlipidemia and diabetes       Exercise Goals and  Review: Exercise Goals    Row Name 09/28/16 0857             Exercise Goals   Increase Physical Activity  Yes       Intervention  Provide advice, education, support and counseling about physical activity/exercise needs.;Develop an individualized exercise prescription for aerobic and resistive training based on initial evaluation findings, risk stratification, comorbidities and participant's personal goals.       Expected Outcomes  Achievement of increased cardiorespiratory fitness and enhanced flexibility, muscular endurance and strength shown through measurements of functional capacity and personal statement of participant.       Increase Strength and Stamina  Yes       Intervention  Provide advice, education, support and counseling about physical activity/exercise needs.;Develop an individualized exercise prescription for aerobic and resistive training based on initial evaluation findings, risk stratification, comorbidities and participant's personal goals.       Expected Outcomes  Achievement of increased cardiorespiratory fitness and enhanced flexibility, muscular endurance and strength shown through measurements of functional capacity and personal statement of participant.          Nutrition & Weight - Outcomes: Pre Biometrics - 09/28/16 1526      Pre Biometrics   Height  '5\' 8"'$  (1.727 m)    Weight  192 lb 14.4 oz (87.5 kg)    Waist Circumference  43 inches    Hip Circumference  39.5 inches    Waist to Hip Ratio  1.09 %    BMI (Calculated)  29.4    Triceps Skinfold  22 mm    % Body Fat  31.3 %    Grip Strength  39 kg    Flexibility  9 in    Single Leg Stand  30 seconds      Post Biometrics - 12/15/16 1029       Post  Biometrics   Height  '5\' 8"'$  (1.727 m)    Waist Circumference  40.5 inches    Hip Circumference  40.75 inches    Waist to Hip Ratio  0.99 %    Triceps Skinfold  20 mm    Grip Strength  43.5 kg    Flexibility  14 in    Single Leg Stand  30 seconds        Nutrition: Nutrition Therapy & Goals - 10/04/16 1044      Nutrition Therapy   Diet  Carb Modified, Therapeutic Lifestyle Changes    Drug/Food Interactions  Coumadin/Vit K      Personal Nutrition Goals   Nutrition Goal  Pt to identify and limit food sources of saturated fat, trans fat, and sodium    Personal Goal #2  Pt to identify food quantities necessary to achieve weight loss of 6-20 lb at graduation from cardiac rehab. Goal wt of 175 lb desired.     Personal Goal #3  Pt able to name foods rich in vitamin K. (Pt taking Coumadin/Warfarin).      Intervention Plan   Intervention  Prescribe, educate and counsel regarding individualized specific dietary modifications aiming towards targeted core components such as weight, hypertension, lipid management, diabetes, heart failure and other comorbidities.    Expected Outcomes  Short Term Goal: Understand basic principles of dietary content, such as calories, fat, sodium, cholesterol and nutrients.;Long Term Goal: Adherence to prescribed nutrition plan.       Nutrition Discharge: Nutrition Assessments - 10/04/16 1044      MEDFICTS Scores   Pre Score  59       Education Questionnaire Score: Knowledge Questionnaire Score - 12/27/16 0943      Knowledge Questionnaire Score   Pre Score  17/24    Post Score  23/24       Goals reviewed with patient; copy given to patient.Dicky graduated from cardiac rehab program today with completion of 36 exercise sessions in Phase II. Pt maintained good attendance and progressed nicely during his participation in rehab as evidenced by increased MET level.   Medication list reconciled. Repeat  PHQ score- 0 .  Pt has made significant lifestyle changes and should be commended for his success. Pt feels he has achieved his goals duringcardiac rehab.   Pt plans to continue exercise by walking his dog twice a day and doing yardwork.Barnet Pall, RN,BSN 12/27/2016 11:33 AM

## 2016-12-29 ENCOUNTER — Encounter (HOSPITAL_COMMUNITY): Payer: 59

## 2017-01-01 ENCOUNTER — Encounter (HOSPITAL_COMMUNITY): Payer: 59

## 2017-01-03 ENCOUNTER — Other Ambulatory Visit: Payer: Self-pay

## 2017-01-03 ENCOUNTER — Encounter (HOSPITAL_COMMUNITY): Payer: 59

## 2017-01-03 ENCOUNTER — Telehealth: Payer: Self-pay | Admitting: Family Medicine

## 2017-01-03 DIAGNOSIS — M21371 Foot drop, right foot: Secondary | ICD-10-CM

## 2017-01-03 NOTE — Telephone Encounter (Signed)
Copied from [place]. Topic: Referral - Request >> Jan 03, 2017 10:16 AM Carolyn Stare wrote: Reason for CRM:     pt has a drop foot and his chiropractor suggest that he see a neurologist . Pt call today to ask for a referral to a neurologist

## 2017-01-03 NOTE — Telephone Encounter (Signed)
Referral to a Neurology has been placed. Pt is aware.

## 2017-01-17 ENCOUNTER — Encounter (INDEPENDENT_AMBULATORY_CARE_PROVIDER_SITE_OTHER): Payer: Self-pay

## 2017-01-17 ENCOUNTER — Ambulatory Visit (INDEPENDENT_AMBULATORY_CARE_PROVIDER_SITE_OTHER): Payer: 59 | Admitting: Diagnostic Neuroimaging

## 2017-01-17 ENCOUNTER — Encounter: Payer: Self-pay | Admitting: Diagnostic Neuroimaging

## 2017-01-17 VITALS — BP 124/80 | HR 85 | Ht 68.5 in | Wt 196.2 lb

## 2017-01-17 DIAGNOSIS — M21371 Foot drop, right foot: Secondary | ICD-10-CM | POA: Diagnosis not present

## 2017-01-17 DIAGNOSIS — R29898 Other symptoms and signs involving the musculoskeletal system: Secondary | ICD-10-CM | POA: Diagnosis not present

## 2017-01-17 DIAGNOSIS — M79604 Pain in right leg: Secondary | ICD-10-CM

## 2017-01-17 NOTE — Progress Notes (Signed)
[facility]  PATIENT: David Carr. DOB: November 12, 1945  REFERRING CLINICIAN: Rhunette Croft, MD HISTORY FROM: patient and chart review REASON FOR VISIT: new consult    HISTORICAL  CHIEF COMPLAINT:  Chief Complaint  Patient presents with  . Right foot drop    rm 6, New Pt, "1 week before Thanksgiving I developed right leg numbness, pain and R foot drop; medication didn't help pain"    HISTORY OF PRESENT ILLNESS:   72 year old male here for evaluation of right foot drop weakness.  Patient has history of AML in 2015 now in remission.  Also history of diabetes, mitral valve repair 2018.  More than 40 years ago patient had low back pain rating to the right leg with right foot drop weakness, was offered to have surgery, but patient monitored and symptoms resolved on their own.  December 28, 2016 patient developed right buttock pain, radiating to the right lateral leg and thigh into the right foot.  He noted some twitching in his right leg.  Patient had follow-up with PCP and was noted to have right foot drop weakness.  Symptoms are stable to slightly improving.   REVIEW OF SYSTEMS: Full 14 system review of systems performed and negative with exception of: Numbness.  ALLERGIES: Allergies  Allergen Reactions  . Prochlorperazine Other (See Comments)    ARRHYTHMIAS  . Morphine And Related     Shuts bladder down.    HOME MEDICATIONS: Outpatient Medications Prior to Visit  Medication Sig Dispense Refill  . acetaminophen (TYLENOL) 500 MG tablet Take 2 tablets (1,000 mg total) by mouth every 6 (six) hours as needed. 30 tablet 0  . aspirin 81 MG tablet Take 81 mg by mouth daily with supper.     Marland Kitchen atenolol (TENORMIN) 25 MG tablet Take 0.5 tablets (12.5 mg total) by mouth daily. 100 tablet 4  . Insulin Glargine (BASAGLAR KWIKPEN) 100 UNIT/ML SOPN Inject 16 Units into the skin at bedtime.    . metFORMIN (GLUCOPHAGE-XR) 500 MG 24 hr tablet Take 2 tablets (1,000 mg  total) by mouth daily with supper. 180 tablet 3  . simvastatin (ZOCOR) 20 MG tablet Take 1 tablet (20 mg total) by mouth at bedtime. 100 tablet 4  . warfarin (COUMADIN) 5 MG tablet Take 1 tablet by mouth daily or as directed.  Must schedule INR check for further refills (Patient not taking: Reported on 12/27/2016) 30 tablet 0   Facility-Administered Medications Prior to Visit  Medication Dose Route Frequency Provider Last Rate Last Dose  . heparin lock flush 100 unit/mL  500 Units Intravenous Once Brunetta Genera, MD      . sodium chloride 0.9 % injection 10 mL  10 mL Intravenous PRN Brunetta Genera, MD        PAST MEDICAL HISTORY: Past Medical History:  Diagnosis Date  . AML (acute myeloid leukemia) in remission [facility]) oncologist-  dr Alvy Bimler-- per last note 10/ 2017 in remission 2 years   dx 09-11-2013  via bone marrow bx , FLT3 negative (NP M1 +)/  chemotherapy started 09-17-2013,  remission via marrow bx 11-06-2013,  4 cycles consolidation chemo w/ HiDAC 11-13-2013 to 03-05-2014  . BPH with urinary obstruction   . Chronic thrombocytopenic purpura ([place])   . Dental caries    periodontitis  . GERD (gastroesophageal reflux disease)   . Heart murmur   . History of kidney stones   . Hyperlipidemia   . Hypertension   . MVP (mitral valve prolapse)   .  Pancytopenia, acquired ([place])   . Right ureteral stone   . 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 Severe mitral regurgitation   . Type 2 diabetes mellitus with hypoglycemia, with long-term current use of insulin ([facility]    endocrinologist-  dr gCon Memos . Wears denture    upper    PAST SURGICAL HISTORY: Past Surgical History:  Procedure Laterality Date  . CARDIAC CATHETERIZATION    . CARDIOVASCULAR  STRESS TEST  07/23/2013   Low risk nuclear study w/ mild inferior ischemia/  normal LV function and wall motion, ef 69%  . CYSTOSCOPY WITH RETROGRADE PYELOGRAM, URETEROSCOPY AND STENT PLACEMENT Right 04/20/2016   Procedure: CYSTOSCOPY WITH RETROGRADE PYELOGRAM, URETEROSCOPY , STONE BASKETRY AND STENT PLACEMENT;  Surgeon: SFranchot Gallo MD;  [facility]  Service: Urology;  Laterality: Right;  . EXTRACORPOREAL SHOCK WAVE LITHOTRIPSY  yrs ago  . HOLMIUM LASER APPLICATION Right 35/05/2704  Procedure: HOLMIUM LASER APPLICATION;  Surgeon: SFranchot Gallo MD;  [facility]  Service: Urology;  Laterality: Right;  . INGUINAL HERNIA REPAIR Left 1990's  . MITRAL VALVE REPAIR N/A 06/21/2016   Procedure: MINIMALLY INVASIVE MITRAL VALVE REPAIR (MVR) USING SORIN MEMO 3D SIZE 32 SEMIRIGID ANNULOPLASTY RING;  Surgeon: ORexene Alberts MD;  [facility]  Service: Open Heart Surgery;  Laterality: N/A;  . MULTIPLE EXTRACTIONS WITH ALVEOLOPLASTY N/A 06/08/2016   Procedure: MULTIPLE EXTRACTION WITH ALVEOLOPLASTY AND PRE-PROSTHETIC SURGERY AS NEEDED;  Surgeon: RLenn Cal DDS;  [facility]  Service: Oral Surgery;  Laterality: N/A;  . PATENT FORAMEN OVALE(PFO) CLOSURE N/A 06/21/2016   Procedure: PATENT FORAMEN OVALE (PFO) CLOSURE;  Surgeon: ORexene Alberts MD;  [facility]  Service: Open Heart Surgery;  Laterality: N/A;  . RIGHT/LEFT HEART CATH AND CORONARY ANGIOGRAPHY N/A 06/07/2016   Procedure: Right/Left Heart Cath and Coronary Angiography;  Surgeon: MSherren Mocha MD;  [facility];  Service: Cardiovascular;  Laterality: N/A;  . ROTATOR CUFF REPAIR Right [id number] . TEE WITHOUT CARDIOVERSION N/A 01/11/2016   Procedure: TRANSESOPHAGEAL ECHOCARDIOGRAM (TEE);  Surgeon: KPixie Casino MD;  [facility]  Service: Cardiovascular;  Laterality: N/A; Severe MR associated w/ flail P2 segment of MV;  no LAA thromus;  small PFO  by saline microbubble contrast after valsalva; LVEF 55-60%  . TEE WITHOUT CARDIOVERSION N/A 06/21/2016   Procedure: TRANSESOPHAGEAL ECHOCARDIOGRAM (TEE);  Surgeon: ORexene Alberts MD;  [facility]: [facility]  Service: Open Heart Surgery;  Laterality: N/A;  . TENNIS ELBOW RELEASE/NIRSCHEL PROCEDURE Right 1990's  . TRANSTHORACIC ECHOCARDIOGRAM  12/22/2015   grade 1 diastolic dysfunction,  ef 55-60%/  moderate MVP involving posterior leaflet w/ partial flail and severe MR via CW doppler (peak gradient 347mg)/  trivial TR    FAMILY HISTORY: Family History  Problem Relation Age of Onset  . Asthma Sister   . Cancer Daughter        carcinoid tumor    SOCIAL HISTORY:  Social History   Socioeconomic History  . Marital status: Married    Spouse name: Not on file  . Number of children: 2  . Years of education: college grad  . Highest education level: Not on file  Social Needs  . Financial resource strain: Not on file  . Food insecurity - worry:  Not on file  . Food insecurity - inability: Not on file  . Transportation needs - medical: Not on file  . Transportation needs - non-medical: Not on file  Occupational History    Comment: retired, NA  Tobacco Use  . Smoking status: Former Smoker    Packs/day: 1.50    Years: 20.00    Pack years: 30.00    Types: Cigarettes    Last attempt to quit: 04/18/1974    Years since quitting: 42.7  . Smokeless tobacco: Never Used  Substance and Sexual Activity  . Alcohol use: No  . Drug use: No  . Sexual activity: Not Currently  Other Topics Concern  . Not on file  Social History Narrative   Lives wife,  Son   Coffee, 1 cup  daily     PHYSICAL EXAM  GENERAL EXAM/CONSTITUTIONAL: Vitals:  Vitals:   01/17/17 0845  BP: 124/80  Pulse: 85  Weight: 196 lb 3.2 oz (89 kg)  Height: 5' 8.5" (1.74 m)    Wt Readings from Last 3 Encounters:  01/17/17 196 lb 3.2 oz (89 kg)  12/27/16 192 lb 14.4 oz (87.5 kg)  11/30/16 195 lb 1.6 oz (88.5 kg)    Body  mass index is 29.4 kg/m.  Visual Acuity Screening   Right eye Left eye Both eyes  Without correction: 20/20 20/40   With correction:        Patient is in no distress; well developed, nourished and groomed; neck is supple  CARDIOVASCULAR:  Examination of carotid arteries is normal; no carotid bruits  Regular rate and rhythm, no murmurs  Examination of peripheral vascular system by observation and palpation is normal  EYES:  Ophthalmoscopic exam of optic discs and posterior segments is normal; no papilledema or hemorrhages  MUSCULOSKELETAL:  Gait, strength, tone, movements noted in Neurologic exam below  NEUROLOGIC: MENTAL STATUS:  No flowsheet data found.  awake, alert, oriented to person, place and time  recent and remote memory intact  normal attention and concentration  language fluent, comprehension intact, naming intact,   fund of knowledge appropriate  CRANIAL NERVE:   2nd - no papilledema on fundoscopic exam  2nd, 3rd, 4th, 6th - pupils equal and reactive to light, visual fields full to confrontation, extraocular muscles intact, no nystagmus  5th - facial sensation symmetric  7th - facial strength symmetric  8th - hearing intact  9th - palate elevates symmetrically, uvula midline  11th - shoulder shrug symmetric  12th - tongue protrusion midline  MOTOR:   normal bulk and tone, full strength in the BUE  BLE --> RIGHT HIP FLEX 3; LEFT HIP FLEX 4; KE / KF 5; RIGHT DF 3+; RIGHT FOOT EVERSION 4+; RIGHT FOOT INVERSION 5; LEFT DF 5  SENSORY:   normal and symmetric to light touch, pinprick, temperature, vibration; EXCEPT DECR PP IN BILATERAL FEET; DECR PP IN RIGHT FOOT BETWEEN TOE 1 AND 2, RIGHT LATERAL FOOT, RIGHT LATERAL ANKLE, AND BOTTOM OF FOOT; DECR PP IN LEFT FOOT LATERALLY AND ON BOTTOM OF FOOT  FABER / FADIR TEST NEG  STRAIGHT LEG RAISE ON RIGHT --> SLIGHT TINGLING IN RIGHT FOOT  COORDINATION:   finger-nose-finger, fine finger movements  normal  REFLEXES:   deep tendon reflexes --> BUE TRACE; BLE TRACE  GAIT/STATION:   narrow based gait; able to walk on toes; DIFF WITH RIGHT HEEL WALKING; TANDEM UNSTEADY; romberg is negative    DIAGNOSTIC DATA (LABS, IMAGING, TESTING) - I reviewed patient records, labs, notes, testing  and imaging myself where available.  Lab Results  Component Value Date   WBC 4.4 11/30/2016   HGB 16.7 11/30/2016   HCT 46.2 11/30/2016   MCV 90.4 11/30/2016   PLT 131 (L) 11/30/2016      Component Value Date/Time   NA 142 11/07/2016 1109   NA 139 07/03/2016 1233   NA 138 12/02/2015 1335   K 5.2 (H) 11/07/2016 1109   K 4.1 12/02/2015 1335   CL 104 11/07/2016 1109   CO2 32 11/07/2016 1109   CO2 24 12/02/2015 1335   GLUCOSE 164 (H) 11/07/2016 1109   GLUCOSE 252 (H) 12/02/2015 1335   BUN 12 11/07/2016 1109   BUN 15 07/03/2016 1233   BUN 14.2 12/02/2015 1335   CREATININE 0.96 11/07/2016 1109   CREATININE 0.90 01/03/2016 1056   CREATININE 1.0 12/02/2015 1335   CALCIUM 9.8 11/07/2016 1109   CALCIUM 9.0 12/02/2015 1335   PROT 6.5 11/07/2016 1109   PROT 6.6 12/02/2015 1335   ALBUMIN 4.2 11/07/2016 1109   ALBUMIN 3.5 12/02/2015 1335   AST 20 11/07/2016 1109   AST 21 12/02/2015 1335   ALT 22 11/07/2016 1109   ALT 28 12/02/2015 1335   ALKPHOS 71 11/07/2016 1109   ALKPHOS 79 12/02/2015 1335   BILITOT 0.9 11/07/2016 1109   BILITOT 0.61 12/02/2015 1335   GFRNONAA >60 07/16/2016 1536   GFRAA >60 07/16/2016 1536   Lab Results  Component Value Date   CHOL 156 11/07/2016   HDL 47.90 11/07/2016   LDLCALC 87 11/07/2016   LDLDIRECT 74.4 06/19/2006   TRIG 103.0 11/07/2016   CHOLHDL 3 11/07/2016   Lab Results  Component Value Date   HGBA1C 7.5 (H) 11/07/2016   Lab Results  Component Value Date   VITAMINB12 336 08/11/2014   Lab Results  Component Value Date   TSH 2.66 11/07/2016        ASSESSMENT AND PLAN  71 y.o. year old male here with new onset right leg numbness and  weakness in November 2018, associated with sudden onset pain, now stable to slightly improving.  Could represent diabetic plexopathy, right lumbar radiculopathy, or right piriformis syndrome.  Will proceed with further workup.   Ddx: diabetic plexopathy vs right L5-S1 radiculopathy vs right sciatica (piriformis syndrome)  1. Right leg weakness   2. Right leg pain   3. Right foot drop      PLAN:  - MRI lumbar spine; then may check EMG/NCS if necessary  - consider PT evaluation   Orders Placed This Encounter  Procedures  . MR LUMBAR SPINE WO CONTRAST   Return in about 4 months (around 05/18/2017).    Penni Bombard, MD 04/17/7423, 9:56 AM Certified in Neurology, Neurophysiology and Neuroimaging  [facility] [address], [place], [place] [address] [phone]

## 2017-01-17 NOTE — Patient Instructions (Signed)

## 2017-01-23 NOTE — Addendum Note (Signed)
Encounter addended by: Jewel Baize, RD on: 01/23/2017 10:04 AM  Actions taken: Flowsheet data copied forward, Visit Navigator Flowsheet section accepted

## 2017-01-26 ENCOUNTER — Ambulatory Visit
Admission: RE | Admit: 2017-01-26 | Discharge: 2017-01-26 | Disposition: A | Discharge status: Home / Self Care | Payer: 59 | Source: Ambulatory Visit | Attending: Diagnostic Neuroimaging | Admitting: Diagnostic Neuroimaging

## 2017-01-26 DIAGNOSIS — M21371 Foot drop, right foot: Secondary | ICD-10-CM

## 2017-01-26 DIAGNOSIS — M79604 Pain in right leg: Secondary | ICD-10-CM

## 2017-01-26 DIAGNOSIS — R29898 Other symptoms and signs involving the musculoskeletal system: Secondary | ICD-10-CM

## 2017-01-26 NOTE — Addendum Note (Signed)
Encounter addended by: Jewel Baize, RD on: 01/26/2017 1:58 PM  Actions taken: Flowsheet data copied forward, Visit Navigator Flowsheet section accepted

## 2017-01-29 ENCOUNTER — Encounter: Payer: Self-pay | Admitting: Family Medicine

## 2017-02-02 ENCOUNTER — Other Ambulatory Visit: Payer: Self-pay | Admitting: Diagnostic Neuroimaging

## 2017-02-02 ENCOUNTER — Ambulatory Visit (HOSPITAL_COMMUNITY)
Admission: RE | Admit: 2017-02-02 | Discharge: 2017-02-02 | Disposition: A | Discharge status: Home / Self Care | Payer: 59 | Source: Ambulatory Visit | Attending: Diagnostic Neuroimaging | Admitting: Diagnostic Neuroimaging

## 2017-02-02 DIAGNOSIS — M79604 Pain in right leg: Secondary | ICD-10-CM | POA: Diagnosis not present

## 2017-02-02 DIAGNOSIS — M48061 Spinal stenosis, lumbar region without neurogenic claudication: Secondary | ICD-10-CM | POA: Diagnosis not present

## 2017-02-02 DIAGNOSIS — M5416 Radiculopathy, lumbar region: Secondary | ICD-10-CM | POA: Diagnosis not present

## 2017-02-02 DIAGNOSIS — R29898 Other symptoms and signs involving the musculoskeletal system: Secondary | ICD-10-CM | POA: Diagnosis present

## 2017-02-02 DIAGNOSIS — M21371 Foot drop, right foot: Secondary | ICD-10-CM | POA: Insufficient documentation

## 2017-02-02 DIAGNOSIS — M4807 Spinal stenosis, lumbosacral region: Secondary | ICD-10-CM | POA: Diagnosis not present

## 2017-02-14 ENCOUNTER — Telehealth: Payer: Self-pay | Admitting: Diagnostic Neuroimaging

## 2017-02-14 NOTE — Telephone Encounter (Signed)
Spoke with pt. and per RAS, advised that MRI L-spine showed degen. changes in lower back, worse on the left (but his sx. are right foot drop, right  buttock pain), and bone marrow changes, likely assoc. with hx. of leukemia.  Possible tx. option would be Right L5-S1 esi. Pt. verbalized understanding of MRI report, sts. Dr. Mamie Nick advised he avoid crossing his legs, and try to write the alphabet with his foot. Pt. sts. these interventions have helped some.  Pain is gone. Some numbness remains. Still has some right foot drop. FYI, He was not able to have MRI at [facility]--sts. their magnet was too strong (hx. of heart valve repair, with metal implant.)Will pass this update along to Dr. Marijo File

## 2017-02-14 NOTE — Telephone Encounter (Signed)
Patient calling to get MRI results. I advised Dr. Leta Baptist is out of the office but he would like a returned call.

## 2017-02-22 ENCOUNTER — Telehealth: Payer: Self-pay | Admitting: Internal Medicine

## 2017-02-22 NOTE — Telephone Encounter (Signed)
And was he actually having a high or a low in those situations? It would be unusual for Basaglar to cause this, but but not impossible.  I need to know his sugars and those situations first, though.

## 2017-02-22 NOTE — Telephone Encounter (Signed)
Pt has been taking 14 units at bedtime with basaglar and is taking XR metformin. He has been feeling light headed, sob, and feeling like he is "high" or having a "sugar crash." Unsure if it is related to those medications and if he could discontinue the [employer]

## 2017-02-22 NOTE — Telephone Encounter (Signed)
He has been on Basaglar in the past, so I doubt that this is the problem.  However, we increased her metformin ER to 1000 mg with dinner at last visit, please asked him to back off to just 1 tablet metformin ER 500 mg with dinner until I see him in 1 week and will address this further.

## 2017-02-22 NOTE — Telephone Encounter (Signed)
Pt stated he needs to know what doseage of Insulin Glargine (BASAGLAR KWIKPEN) 100 UNIT/ML SOPN is being scribed for him.  And if it could be causing light headedness     Please advise

## 2017-02-22 NOTE — Telephone Encounter (Signed)
Pt is aware.  

## 2017-02-22 NOTE — Telephone Encounter (Signed)
He said it was "normal" but could not give me specfic numbers

## 2017-03-02 ENCOUNTER — Ambulatory Visit: Payer: 59 | Admitting: Internal Medicine

## 2017-03-02 ENCOUNTER — Encounter: Payer: Self-pay | Admitting: Internal Medicine

## 2017-03-02 VITALS — BP 130/80 | HR 82 | Ht 68.0 in | Wt 195.8 lb

## 2017-03-02 DIAGNOSIS — E785 Hyperlipidemia, unspecified: Secondary | ICD-10-CM

## 2017-03-02 DIAGNOSIS — Z794 Long term (current) use of insulin: Secondary | ICD-10-CM | POA: Diagnosis not present

## 2017-03-02 DIAGNOSIS — E663 Overweight: Secondary | ICD-10-CM

## 2017-03-02 DIAGNOSIS — E11649 Type 2 diabetes mellitus with hypoglycemia without coma: Secondary | ICD-10-CM

## 2017-03-02 LAB — POCT GLYCOSYLATED HEMOGLOBIN (HGB A1C): HEMOGLOBIN A1C: 6.9

## 2017-03-02 MED ORDER — BASAGLAR KWIKPEN 100 UNIT/ML ~~LOC~~ SOPN: 16.0000 [IU] | PEN_INJECTOR | Freq: Every day | SUBCUTANEOUS | 3 refills | Status: DC

## 2017-03-02 NOTE — Progress Notes (Signed)
Patient ID: [name], male   DOB: January 21, 1946, 72 y.o.   MRN: [id number]   HPI: David Carr. is a 72 y.o.-year-old male, returning for f/u for DM2, dx in end of the 1990s, insulin-dependent since ~2014, uncontrolled, without long term complications. Last visit 4 months ago.  He continues to go to cardiac rehab MWF at 9:30 AM.  At last visits, we discussed about improving his diet and he was able to cut out concentrated, processed, sweets and also CoolAid.  Last hemoglobin A1c was: Lab Results  Component Value Date   HGBA1C 7.5 (H) 11/07/2016   HGBA1C 6.6 08/29/2016   HGBA1C 6.4 (H) 06/19/2016   Pt was on a regimen of: - Humalog 75/25 35 >> increased to 45 units at bedtime! - Metformin 500 mg 2x a day, with meals >> now 500 mg at dinnertime  Now on: - Metformin ER 500 mg with dinner (decreased 2/2 dizziness + SOB) - Basaglar 30 >> 20  >> 16 units at bedtime.  Pt checks his sugars 0-3 times a day - am: 90s-110 >> 100-115 >> 106-134 >> 117-143 >> 106-130, 154 - 2h after b'fast: n/c >> 117-144, 275 (poptarts) >> 138-171 >> n/c - before lunch: n/c >> 122, 177 (after 275) >> 117-146 >> 134-144 - 2h after lunch: n/c >> 150-206 >> 127 >> 130, 173, 205 (soft drink) - before dinner: n/c >> 102-130 >> 93, 126 >> 96-146 - 2h after dinner: n/c >> 153-189 >> 153-199 - coolaid after dinner >> 143-164, 194 - bedtime: n/c >> 140-180s >> 148-193 >> n/c >> 134-204 - nighttime: n/c He has hypoglycemia awareness -but unclear at what glucose level. He had an episode of loss of consciousness when sugars reached 60 in 07/17/2016. He decreased his insulin doses then to 20 units daily, and subsequently, on 07/27/2016, to 16 units daily.  No lows since. Highest sugar was 199 >> 205 (soda).  Glucometer: Freestyle Lite  Pt's meals are: - Breakfast: coffee + nab or slice pound cake + pop tarts - Lunch: fast food or sandwich or skips - Dinner: meat + veggies  - Snacks: 1, before  bedtime  he only has occasional regular sodas now.  He walks 2x day: 1.5 mi - with his black lab.  -No CKD, last BUN/creatinine:  Lab Results  Component Value Date   BUN 12 11/07/2016   BUN 8 07/16/2016   CREATININE 0.96 11/07/2016   CREATININE 0.92 07/16/2016   -+ HL last set of lipids: Lab Results  Component Value Date   CHOL 156 11/07/2016   HDL 47.90 11/07/2016   LDLCALC 87 11/07/2016   LDLDIRECT 74.4 06/19/2006   TRIG 103.0 11/07/2016   CHOLHDL 3 11/07/2016  On Zocor. - last eye exam was in 2013, no DR. He still did not have another eye exam despite multiple promptings.  - Denies numbness and tingling in his feet.  He has AML in remission.  Now off Coumadin.  ROS: Constitutional: no weight gain/no weight loss, + fatigue, no subjective hyperthermia, no subjective hypothermia Eyes: no blurry vision, no xerophthalmia ENT: no sore throat, no nodules palpated in throat, no dysphagia, no odynophagia, no hoarseness Cardiovascular: no CP/+ SOB/no palpitations/no leg swelling Respiratory: no cough/+ SOB/no wheezing Gastrointestinal: no N/no V/no D/no C/no acid reflux Musculoskeletal: no muscle aches/+ joint aches Skin: no rashes, no hair loss Neurological: no tremors/no numbness/no tingling/no dizziness  I reviewed pt's medications, allergies, PMH, social hx, family hx, and changes were documented  in the history of present illness. Otherwise, unchanged from my initial visit note.  Past Medical History:  Diagnosis Date  . AML (acute myeloid leukemia) in remission [facility]) oncologist-  dr Alvy Bimler-- per last note 10/ 2017 in remission 2 years   dx 09-11-2013  via bone marrow bx , FLT3 negative (NP M1 +)/  chemotherapy started 09-17-2013,  remission via marrow bx 11-06-2013,  4 cycles consolidation chemo w/ HiDAC 11-13-2013 to 03-05-2014  . BPH with urinary obstruction   . Chronic thrombocytopenic purpura ([place])   . Dental caries    periodontitis  . GERD (gastroesophageal  reflux disease)   . Heart murmur   . History of kidney stones   . Hyperlipidemia   . Hypertension   . MVP (mitral valve prolapse)   . Pancytopenia, acquired ([place])   . Right ureteral stone   . 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 Severe mitral regurgitation   . Type 2 diabetes mellitus with hypoglycemia, with long-term current use of insulin ([facility]    endocrinologist-  dr gCon Memos . Wears denture    upper   Past Surgical History:  Procedure Laterality Date  . CARDIAC CATHETERIZATION    . CARDIOVASCULAR STRESS TEST  07/23/2013   Low risk nuclear study w/ mild inferior ischemia/  normal LV function and wall motion, ef 69%  . CYSTOSCOPY WITH RETROGRADE PYELOGRAM, URETEROSCOPY AND STENT PLACEMENT Right 04/20/2016   Procedure: CYSTOSCOPY WITH RETROGRADE PYELOGRAM, URETEROSCOPY , STONE BASKETRY AND STENT PLACEMENT;  Surgeon: SFranchot Gallo MD;  [facility]  Service: Urology;  Laterality: Right;  . EXTRACORPOREAL SHOCK WAVE LITHOTRIPSY  yrs ago  . HOLMIUM LASER APPLICATION Right 31/0/1751  Procedure: HOLMIUM LASER APPLICATION;  Surgeon: SFranchot Gallo MD;  [facility]  Service: Urology;  Laterality: Right;  . INGUINAL HERNIA REPAIR Left 1990's  . MITRAL VALVE REPAIR N/A 06/21/2016   Procedure: MINIMALLY INVASIVE MITRAL VALVE REPAIR (MVR) USING SORIN MEMO 3D SIZE 32 SEMIRIGID ANNULOPLASTY RING;  Surgeon: ORexene Alberts MD;  [place]  Service: Open Heart Surgery;  Laterality: N/A;  . MULTIPLE EXTRACTIONS WITH ALVEOLOPLASTY N/A 06/08/2016   Procedure: MULTIPLE EXTRACTION WITH ALVEOLOPLASTY AND PRE-PROSTHETIC SURGERY AS NEEDED;  Surgeon: RLenn Cal DDS;  [place] [facility]  Service: Oral Surgery;   Laterality: N/A;  . PATENT FORAMEN OVALE(PFO) CLOSURE N/A 06/21/2016   Procedure: PATENT FORAMEN OVALE (PFO) CLOSURE;  Surgeon: ORexene Alberts MD;  [place]  Service: Open Heart Surgery;  Laterality: N/A;  . RIGHT/LEFT HEART CATH AND CORONARY ANGIOGRAPHY N/A 06/07/2016   Procedure: Right/Left Heart Cath and Coronary Angiography;  Surgeon: MSherren Mocha MD;  [facility];  Service: Cardiovascular;  Laterality: N/A;  . ROTATOR CUFF REPAIR Right [id number] . TEE WITHOUT CARDIOVERSION N/A 01/11/2016   Procedure: TRANSESOPHAGEAL ECHOCARDIOGRAM (TEE);  Surgeon: KPixie Casino MD;  [facility];  Service: Cardiovascular;  Laterality: N/A; Severe MR associated w/ flail P2 segment of MV;  no LAA thromus;  small PFO by saline microbubble contrast after valsalva; LVEF 55-60%  . TEE WITHOUT CARDIOVERSION N/A 06/21/2016   Procedure: TRANSESOPHAGEAL ECHOCARDIOGRAM (TEE);  Surgeon: ORexene Alberts MD;  [place]  Service: Open Heart Surgery;  Laterality: N/A;  . TENNIS ELBOW RELEASE/NIRSCHEL PROCEDURE Right  1990's  . TRANSTHORACIC ECHOCARDIOGRAM  12/22/2015   grade 1 diastolic dysfunction,  ef 55-60%/  moderate MVP involving posterior leaflet w/ partial flail and severe MR via CW doppler (peak gradient 19mHg)/  trivial TR   Social History   Social History  . Marital status: Married    Spouse name: N/A  . Number of children: 2   Occupational History  . accountant   Social History Main Topics  . Smoking status: Former SResearch scientist (life sciences) . Smokeless tobacco: Never Used  . Alcohol use No  . Drug use: No   Current Outpatient Medications on File Prior to Visit  Medication Sig Dispense Refill  . acetaminophen (TYLENOL) 500 MG tablet Take 2 tablets (1,000 mg total) by mouth every 6 (six) hours as needed. 30 tablet 0  . aspirin 81 MG tablet Take 81 mg by mouth daily with supper.     .Marland Kitchenatenolol (TENORMIN) 25 MG tablet Take 0.5 tablets (12.5 mg total) by mouth daily. 100  tablet 4  . Insulin Glargine (BASAGLAR KWIKPEN) 100 UNIT/ML SOPN Inject 16 Units into the skin at bedtime.    . metFORMIN (GLUCOPHAGE-XR) 500 MG 24 hr tablet Take 2 tablets (1,000 mg total) by mouth daily with supper. 180 tablet 3  . simvastatin (ZOCOR) 20 MG tablet Take 1 tablet (20 mg total) by mouth at bedtime. 100 tablet 4   Current Facility-Administered Medications on File Prior to Visit  Medication Dose Route Frequency Provider Last Rate Last Dose  . heparin lock flush 100 unit/mL  500 Units Intravenous Once KBrunetta Genera MD      . sodium chloride 0.9 % injection 10 mL  10 mL Intravenous PRN KBrunetta Genera MD        Allergies  Allergen Reactions  . Prochlorperazine Other (See Comments)    ARRHYTHMIAS  . Morphine And Related     Shuts bladder down.   Family History  Problem Relation Age of Onset  . Asthma Sister   . Cancer Daughter        carcinoid tumor   PE: BP 130/80   Pulse 82   Ht '5\' 8"'$  (1.727 m)   Wt 195 lb 12.8 oz (88.8 kg)   SpO2 96%   BMI 29.77 kg/m  Wt Readings from Last 3 Encounters:  03/02/17 195 lb 12.8 oz (88.8 kg)  01/17/17 196 lb 3.2 oz (89 kg)  12/27/16 192 lb 14.4 oz (87.5 kg)   Constitutional: overweight, in NAD Eyes: PERRLA, EOMI, no exophthalmos ENT: moist mucous membranes, no thyromegaly, no cervical lymphadenopathy Cardiovascular: RRR, No RG, no M Respiratory: CTA B Gastrointestinal: abdomen soft, NT, ND, BS+ Musculoskeletal: no deformities, strength intact in all 4 Skin: moist, warm, no rashes Neurological: no tremor with outstretched hands, DTR normal in all 4  ASSESSMENT: 1. DM2, insulin-dependent, uncontrolled, without Long term complications, but with hypoglycemia  2. HL  3.  Overweight  PLAN:  1. Patient with long-standing, uncontrolled, diabetes, with improved diet since last visit by cutting out CoolAid.  He is also not drinking regular sodas regularly, only occasionally.  He does notice that his sugars are  higher after she drinks them.  Again advised him to stop them completely. - since last visit, he developed dizziness which improved after decreasing the dose of metformin ER from 1000 mg to 500 mg.  For now, we can continue with 500 mg, but he is interested in retrying the 1000 mg dose if tolerated.  This is ok, and we did  discuss about the possibility of taking 500 mg twice a day.  We will continue the current dose of Basaglar, since sugars are at or close to goal. - I suggested to:  Patient Instructions  Please continue: - Basaglar  16 units at bedtime. - Metformin ER [phone] mg with dinner   Please return in 3 months with your sugar log.   - today, HbA1c is 6.9 % (improved) - continue checking sugars at different times of the day - check 1x a day, rotating checks - advised for yearly eye exams >> he is UTD - Return to clinic in 3 mo with sugar log   2. HL -Reviewed together his latest lipid panel: LDL at goal -Continue Zocor, no side effects  3.  Overweight - He gained a few pounds since last visit - Discussed dietary changes   Philemon Kingdom, MD PhD [facility] Endocrinology

## 2017-03-02 NOTE — Patient Instructions (Addendum)
Please continue: - Basaglar  16 units at bedtime. - Metformin ER [phone] mg with dinner   Please return in 3 months with your sugar log.

## 2017-03-02 NOTE — Addendum Note (Signed)
Addended by: Drucilla Schmidt on: 03/02/2017 04:50 PM   Modules accepted: Orders

## 2017-03-14 ENCOUNTER — Telehealth: Payer: Self-pay | Admitting: Diagnostic Neuroimaging

## 2017-03-14 NOTE — Telephone Encounter (Signed)
I called patient to review symptoms and MRI results. Right foot drop weakness has improved significantly. Pain has improved. No further testing or treatment advised. -VRP

## 2017-03-30 ENCOUNTER — Encounter: Payer: Self-pay | Admitting: *Deleted

## 2017-04-05 ENCOUNTER — Other Ambulatory Visit: Payer: Self-pay | Admitting: Internal Medicine

## 2017-04-07 ENCOUNTER — Encounter: Payer: Self-pay | Admitting: Cardiology

## 2017-04-07 NOTE — Progress Notes (Signed)
HPI The patient presents for followup of MR.  He is status post minimally invasive MV repair with ring annuloplasty and placement of Neo Chord on 06/30/16.  Since I last saw him he is done well.  He walks the dog twice a day every day.  He has had no new cardiovascular symptoms. The patient denies any new symptoms such as chest discomfort, neck or arm discomfort. There has been no new shortness of breath, PND or orthopnea. There have been no reported palpitations, presyncope or syncope.  Allergies  Allergen Reactions  . Prochlorperazine Other (See Comments)    ARRHYTHMIAS  . Morphine And Related     Shuts bladder down.    Current Outpatient Medications  Medication Sig Dispense Refill  . acetaminophen (TYLENOL) 500 MG tablet Take 2 tablets (1,000 mg total) by mouth every 6 (six) hours as needed. 30 tablet 0  . aspirin 81 MG tablet Take 81 mg by mouth daily with supper.     Marland Kitchen atenolol (TENORMIN) 25 MG tablet Take 0.5 tablets (12.5 mg total) by mouth daily. 100 tablet 4  . Insulin Glargine (BASAGLAR KWIKPEN) 100 UNIT/ML SOPN Inject 0.16 mLs (16 Units total) into the skin at bedtime. 10 pen 3  . Insulin Pen Needle (BD PEN NEEDLE NANO U/F) 32G X 4 MM MISC USE 1X A DAY 100 each 3  . metFORMIN (GLUCOPHAGE) 500 MG tablet Take 500 mg by mouth daily.    . simvastatin (ZOCOR) 20 MG tablet Take 1 tablet (20 mg total) by mouth at bedtime. 100 tablet 4   No current facility-administered medications for this visit.    Facility-Administered Medications Ordered in Other Visits  Medication Dose Route Frequency Provider Last Rate Last Dose  . heparin lock flush 100 unit/mL  500 Units Intravenous Once Brunetta Genera, MD      . sodium chloride 0.9 % injection 10 mL  10 mL Intravenous PRN Brunetta Genera, MD        Past Medical History:  Diagnosis Date  . AML (acute myeloid leukemia) in remission [facility]) oncologist-  dr Alvy Bimler-- per last note 10/ 2017 in remission 2 years   dx 09-11-2013   via bone marrow bx , FLT3 negative (NP M1 +)/  chemotherapy started 09-17-2013,  remission via marrow bx 11-06-2013,  4 cycles consolidation chemo w/ HiDAC 11-13-2013 to 03-05-2014  . BPH with urinary obstruction   . Chronic thrombocytopenic purpura ([place])   . Dental caries    periodontitis  . GERD (gastroesophageal reflux disease)   . History of kidney stones   . Hyperlipidemia   . Hypertension   . MVP (mitral valve prolapse)   . Pancytopenia, acquired ([place])   . 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   Complex valvuloplasty including triangular resection of flail segment of posterior leaflet, artificial Gore-tex neochord placement x6 and 59m Sorin Memo 3D ring annuloplasty via right mini thoracotomy approach  . Severe mitral regurgitation   . Type 2 diabetes mellitus with hypoglycemia, with long-term current use of insulin ([facility]    endocrinologist-  dr gCon Memos . Wears denture    upper    Past Surgical History:  Procedure Laterality Date  . CARDIAC CATHETERIZATION    . CARDIOVASCULAR STRESS TEST  07/23/2013   Low risk nuclear study w/ mild inferior ischemia/  normal LV function and wall motion, ef 69%  . CYSTOSCOPY WITH RETROGRADE PYELOGRAM, URETEROSCOPY AND STENT PLACEMENT Right 04/20/2016   Procedure: CYSTOSCOPY WITH RETROGRADE PYELOGRAM, URETEROSCOPY ,  STONE BASKETRY AND STENT PLACEMENT;  Surgeon: Franchot Gallo, MD;  [facility];  Service: Urology;  Laterality: Right;  . EXTRACORPOREAL SHOCK WAVE LITHOTRIPSY  yrs ago  . HOLMIUM LASER APPLICATION Right 8/0/9983   Procedure: HOLMIUM LASER APPLICATION;  Surgeon: Franchot Gallo, MD;  Location: [facility];  Service: Urology;  Laterality: Right;  . INGUINAL HERNIA REPAIR Left 1990's  . MITRAL VALVE REPAIR N/A 06/21/2016   Procedure: MINIMALLY INVASIVE MITRAL VALVE REPAIR (MVR) USING SORIN MEMO 3D SIZE 32 SEMIRIGID ANNULOPLASTY RING;  Surgeon: Rexene Alberts, MD;  Location: [place];  Service: Open Heart Surgery;  Laterality: N/A;  . MULTIPLE EXTRACTIONS WITH ALVEOLOPLASTY N/A 06/08/2016   Procedure: MULTIPLE EXTRACTION WITH ALVEOLOPLASTY AND PRE-PROSTHETIC SURGERY AS NEEDED;  Surgeon: Lenn Cal, DDS;  Location: [place];  Service: Oral Surgery;  Laterality: N/A;  . PATENT FORAMEN OVALE(PFO) CLOSURE N/A 06/21/2016   Procedure: PATENT FORAMEN OVALE (PFO) CLOSURE;  Surgeon: Rexene Alberts, MD;  Location: [place];  Service: Open Heart Surgery;  Laterality: N/A;  . RIGHT/LEFT HEART CATH AND CORONARY ANGIOGRAPHY N/A 06/07/2016   Procedure: Right/Left Heart Cath and Coronary Angiography;  Surgeon: Sherren Mocha, MD;  Location: [place] [facility];  Service: Cardiovascular;  Laterality: N/A;  . ROTATOR CUFF REPAIR Right [id number]  . TEE WITHOUT CARDIOVERSION N/A 01/11/2016   Procedure: TRANSESOPHAGEAL ECHOCARDIOGRAM (TEE);  Surgeon: Pixie Casino, MD;  [facility];  Service: Cardiovascular;  Laterality: N/A; Severe MR associated w/ flail P2 segment of MV;  no LAA thromus;  small PFO by saline microbubble contrast after valsalva; LVEF 55-60%  . TEE WITHOUT CARDIOVERSION N/A 06/21/2016   Procedure: TRANSESOPHAGEAL ECHOCARDIOGRAM (TEE);  Surgeon: Rexene Alberts, MD;  Location: [place];  Service: Open Heart Surgery;  Laterality: N/A;  . TENNIS ELBOW RELEASE/NIRSCHEL PROCEDURE Right 1990's  . TRANSTHORACIC ECHOCARDIOGRAM  12/22/2015   grade 1 diastolic dysfunction,  ef 55-60%/  moderate MVP involving posterior leaflet w/ partial flail and severe MR via CW doppler (peak gradient 24mHg)/  trivial TR    ROS: Mild incisional soreness.  Otherwise as stated in the HPI and negative for all other systems.  PHYSICAL EXAM BP 122/68   Pulse 89   Ht _0  (1.727 m)   Wt 198 lb 9.6 oz (90.1 kg)   BMI 30.20 kg/m   GENERAL:  Well appearing NECK:  No jugular venous distention, waveform within normal limits, carotid upstroke brisk and symmetric, no bruits, no thyromegaly LUNGS:   Clear to auscultation bilaterally CHEST:  Well healed surgical scars. HEART:  PMI not displaced or sustained,S1 and S2 within normal limits, no S3, no S4, no clicks, no rubs, no murmurs ABD:  Flat, positive bowel sounds normal in frequency in pitch, no bruits, no rebound, no guarding, no midline pulsatile mass, no hepatomegaly, no splenomegaly, umbilical hernia EXT:  2 plus pulses throughout, no edema, no cyanosis no clubbing   EKG: Sinus rhythm, rate 89, axis within normal limits, intervals within normal limits, premature ventricular contractions.  Lab Results  Component Value Date   CHOL 156 11/07/2016   HDL 47.90 11/07/2016   LDLCALC 87 11/07/2016   LDLDIRECT 74.4 06/19/2006   TRIG 103.0 11/07/2016   CHOLHDL 3 11/07/2016    ASSESSMENT AND PLAN   MITRAL REGURGITATION -  He is status post repair.  He had a stable repair on echo in 2018  .  I will schedule an echocardiogram in June prior to his follow-up with  Dr. Roxy Manns.  He understands and agrees prophylaxis.  No change in therapy.    HYPERTENSION - The blood pressure is at target.  No change in therapy.   DYSLIPIDEMIA - LDL was as target as above.   No change in therapy.  DM - A1C was 6.9 and he has had his med reduced per his diabetes doctor and Dorena Cookey, MD

## 2017-04-09 ENCOUNTER — Encounter: Payer: Self-pay | Admitting: Cardiology

## 2017-04-09 ENCOUNTER — Ambulatory Visit: Payer: 59 | Admitting: Cardiology

## 2017-04-09 VITALS — BP 122/68 | HR 89 | Ht 68.0 in | Wt 198.6 lb

## 2017-04-09 DIAGNOSIS — E785 Hyperlipidemia, unspecified: Secondary | ICD-10-CM

## 2017-04-09 DIAGNOSIS — I10 Essential (primary) hypertension: Secondary | ICD-10-CM

## 2017-04-09 DIAGNOSIS — Z9889 Other specified postprocedural states: Secondary | ICD-10-CM | POA: Diagnosis not present

## 2017-04-09 NOTE — Patient Instructions (Signed)
Medication Instructions:  Continue current medications  If you need a refill on your cardiac medications before your next appointment, please call your pharmacy.  Labwork: None Ordered    Testing/Procedures: Your physician has requested that you have an echocardiogram early June. Echocardiography is a painless test that uses sound waves to create images of your heart. It provides your doctor with information about the size and shape of your heart and how well your heart's chambers and valves are working. This procedure takes approximately one hour. There are no restrictions for this procedure.   Follow-Up: Your physician wants you to follow-up in: 1 Year. You should receive a reminder letter in the mail two months in advance. If you do not receive a letter, please call our office [phone].     Thank you for choosing [facility] at [facility]!!

## 2017-04-17 ENCOUNTER — Other Ambulatory Visit (HOSPITAL_COMMUNITY): Payer: Self-pay

## 2017-06-05 ENCOUNTER — Ambulatory Visit: Payer: 59 | Admitting: Internal Medicine

## 2017-06-05 ENCOUNTER — Encounter: Payer: Self-pay | Admitting: Internal Medicine

## 2017-06-05 VITALS — BP 128/84 | HR 71 | Ht 68.0 in | Wt 194.4 lb

## 2017-06-05 DIAGNOSIS — E11649 Type 2 diabetes mellitus with hypoglycemia without coma: Secondary | ICD-10-CM | POA: Diagnosis not present

## 2017-06-05 DIAGNOSIS — Z794 Long term (current) use of insulin: Secondary | ICD-10-CM

## 2017-06-05 DIAGNOSIS — E785 Hyperlipidemia, unspecified: Secondary | ICD-10-CM

## 2017-06-05 DIAGNOSIS — E663 Overweight: Secondary | ICD-10-CM | POA: Diagnosis not present

## 2017-06-05 LAB — POCT GLYCOSYLATED HEMOGLOBIN (HGB A1C): Hemoglobin A1C: 7.2

## 2017-06-05 MED ORDER — EMPAGLIFLOZIN 10 MG PO TABS: 10.0000 mg | ORAL_TABLET | Freq: Every day | ORAL | 5 refills | Status: DC

## 2017-06-05 NOTE — Patient Instructions (Addendum)
Please  continue: - Basaglar 16 units at bedtime. - Metformin ER [phone] mg with dinner   Please start: - Jardiance 10 mg before b'fast  Please return in 3 months with your sugar log.

## 2017-06-05 NOTE — Progress Notes (Signed)
Patient ID: David Paolillo [name], male   DOB: 1945/07/20, 72 y.o.   MRN: [id number]   HPI: David Carr. is a 72 y.o.-year-old male, returning for f/u for DM2, dx at the end of the 69s, insulin-dependent since ~2014, uncontrolled, without long term complications. Last visit 3 months ago.  He continues to go to cardiac rehab  MWF at 9:30 AM.    Last hemoglobin A1c was: Lab Results  Component Value Date   HGBA1C 6.9 03/02/2017   HGBA1C 7.5 (H) 11/07/2016   HGBA1C 6.6 08/29/2016   Pt was on a regimen of: - Humalog 75/25 35 >> increased to 45 units at bedtime! - Metformin 500 mg 2x a day, with meals >> now 500 mg at dinnertime  Now on: - Metformin ER 500(-1000 )mg with dinner (decreased 2/2 dizziness + SOB) - Basaglar 30 >> 20  >> 16 units at bedtime.  Pt checks his sugars 0-3 times a day - am: 117-143 >> 106-130, 154 >> 100-141 - 2h after b'fast: 117-144, 275 >> 138-171 >> n/c - before lunch:117-146 >> 134-144 >> 128-143 - 2h after lunch: 127 >> 130, 173, 205 >> 150-195 - before dinner: 93, 126 >> 96-146 >> 108-151 - 2h after dinner: 153-199>> 143-164, 194 >> 119-206 - bedtime: 148-193 >> n/c >> 134-204 >> 125 - nighttime: n/c He has hypoglycemia awareness at 100. He had an episode of loss of consciousness when sugars reached 60 in 07/17/2016. He decreased his insulin doses then to 20 units daily, and subsequently, on 07/27/2016, to 16 units daily.  No lows since. Highest sugar was 199 >> 205 (soda) >> 336 (trailmix)  Glucometer: Freestyle Lite  Pt's meals are: - Breakfast: coffee + nab or slice pound cake + pop tarts - Lunch: fast food or sandwich or skips - Dinner: meat + veggies  - Snacks: 1, before bedtime  he only has occasional regular sodas now.  He walks 2x day: 1.5 miles, with his black lab  -No CKD, last BUN/creatinine:  Lab Results  Component Value Date   BUN 12 11/07/2016   BUN 8 07/16/2016   CREATININE 0.96 11/07/2016   CREATININE 0.92  07/16/2016   -+ HL last set of lipids: Lab Results  Component Value Date   CHOL 156 11/07/2016   HDL 47.90 11/07/2016   LDLCALC 87 11/07/2016   LDLDIRECT 74.4 06/19/2006   TRIG 103.0 11/07/2016   CHOLHDL 3 11/07/2016  On  Zocor. - last eye exam was in 2013: No DR-  he did not schedule another appointment despite multiple promptings   -Denies numbness and tingling in his feet.  He has David Carr in remission.  Now off Coumadin.  ROS: Constitutional: no weight gain/no weight loss, no fatigue, no subjective hyperthermia, no subjective hypothermia Eyes: no blurry David, no xerophthalmia ENT: no sore throat, no nodules palpated in throat, no dysphagia, no odynophagia, no hoarseness Cardiovascular: no CP/no SOB/no palpitations/no leg swelling Respiratory: no cough/no SOB/no wheezing Gastrointestinal: no N/no V/no D/no C/no acid reflux Musculoskeletal: no muscle aches/no joint aches Skin: no rashes, no hair loss Neurological: no tremors/no numbness/no tingling/no dizziness  I reviewed pt's medications, allergies, PMH, social hx, family hx, and changes were documented in the history of present illness. Otherwise, unchanged from my initial visit note.  Past Medical History:  Diagnosis Date  . David Carr (acute myeloid leukemia) in remission David Carr) oncologist-  dr Alvy Bimler-- per last note 10/ 2017 in remission 2 years   dx 09-11-2013  via bone marrow  bx , David Carr negative (NP M1 +)/  chemotherapy started 09-17-2013,  remission via marrow bx 11-06-2013,  4 cycles consolidation chemo w/ HiDAC 11-13-2013 to 03-05-2014  . BPH with urinary obstruction   . Chronic thrombocytopenic purpura (David Carr)   . Dental caries    periodontitis  . GERD (gastroesophageal reflux disease)   . History of kidney stones   . Hyperlipidemia   . Hypertension   . MVP (mitral valve prolapse)   . Pancytopenia, acquired (David Carr)   . 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 Severe mitral regurgitation   . Type 2 diabetes mellitus with hypoglycemia, with long-term current use of insulin (David Carr    endocrinologist-  dr gCon Memos . Wears denture    upper   Past Surgical History:  Procedure Laterality Date  . CARDIAC CATHETERIZATION    . CARDIOVASCULAR STRESS TEST  07/23/2013   Low risk nuclear study w/ mild inferior ischemia/  normal LV function and wall motion, ef 69%  . CYSTOSCOPY WITH RETROGRADE PYELOGRAM, URETEROSCOPY AND STENT PLACEMENT Right 04/20/2016   Procedure: CYSTOSCOPY WITH RETROGRADE PYELOGRAM, URETEROSCOPY , STONE BASKETRY AND STENT PLACEMENT;  Surgeon: SFranchot Gallo MD;  [facility]  Service: Urology;  Laterality: Right;  . EXTRACORPOREAL SHOCK WAVE LITHOTRIPSY  yrs ago  . HOLMIUM LASER APPLICATION Right 32/10/5619  Procedure: HOLMIUM LASER APPLICATION;  Surgeon: SFranchot Gallo MD;  [facility]  Service: Urology;  Laterality: Right;  . INGUINAL HERNIA REPAIR Left 1990's  . MITRAL VALVE REPAIR N/A 06/21/2016   Procedure: MINIMALLY INVASIVE MITRAL VALVE REPAIR (MVR) USING SORIN MEMO 3D SIZE 32 SEMIRIGID ANNULOPLASTY RING;  Surgeon: ORexene Alberts MD;  [facility]  Service: Open Heart Surgery;  Laterality: N/A;  . MULTIPLE EXTRACTIONS WITH ALVEOLOPLASTY N/A 06/08/2016   Procedure: MULTIPLE EXTRACTION WITH ALVEOLOPLASTY AND PRE-PROSTHETIC SURGERY AS NEEDED;  Surgeon: RLenn Cal DDS;  [place] [facility]  Service: Oral Surgery;  Laterality: N/A;  . PATENT FORAMEN OVALE(PFO) CLOSURE N/A 06/21/2016   Procedure: PATENT FORAMEN OVALE (PFO) CLOSURE;  Surgeon: ORexene Alberts MD;  [facility] [facility]  Service: Open Heart Surgery;  Laterality: N/A;  . RIGHT/LEFT HEART CATH AND CORONARY ANGIOGRAPHY N/A 06/07/2016   Procedure:  Right/Left Heart Cath and Coronary Angiography;  Surgeon: MSherren Mocha MD;  [facility];  Service: Cardiovascular;  Laterality: N/A;  . ROTATOR CUFF REPAIR Right [id number] . TEE WITHOUT CARDIOVERSION N/A 01/11/2016   Procedure: TRANSESOPHAGEAL ECHOCARDIOGRAM (TEE);  Surgeon: KPixie Casino MD;  [facility];  Service: Cardiovascular;  Laterality: N/A; Severe MR associated w/ flail P2 segment of MV;  no LAA thromus;  small PFO by saline microbubble contrast after valsalva; LVEF 55-60%  . TEE WITHOUT CARDIOVERSION N/A 06/21/2016   Procedure: TRANSESOPHAGEAL ECHOCARDIOGRAM (TEE);  Surgeon: ORexene Alberts MD;  [facility]  Service: Open Heart Surgery;  Laterality: N/A;  . TENNIS ELBOW RELEASE/NIRSCHEL PROCEDURE Right 1990's  . TRANSTHORACIC ECHOCARDIOGRAM  12/22/2015   grade 1 diastolic dysfunction,  ef 55-60%/  moderate MVP involving posterior leaflet w/ partial flail and severe MR via CW doppler (peak gradient 385mg)/  trivial TR   Social History   Social History  . Marital status: Married    Spouse name: N/A  . Number of children:  2   Occupational History  . accountant   Social History Main Topics  . Smoking status: Former Research scientist (life sciences)  . Smokeless tobacco: Never Used  . Alcohol use No  . Drug use: No   Current Outpatient Medications on File Prior to Visit  Medication Sig Dispense Refill  . acetaminophen (TYLENOL) 500 MG tablet Take 2 tablets (1,000 mg total) by mouth every 6 (six) hours as needed. 30 tablet 0  . aspirin 81 MG tablet Take 81 mg by mouth daily with supper.     Marland Kitchen atenolol (TENORMIN) 25 MG tablet Take 0.5 tablets (12.5 mg total) by mouth daily. 100 tablet 4  . Insulin Glargine (BASAGLAR KWIKPEN) 100 UNIT/ML SOPN Inject 0.16 mLs (16 Units total) into the skin at bedtime. 10 pen 3  . Insulin Pen Needle (BD PEN NEEDLE NANO U/F) 32G X 4 MM MISC USE 1X A DAY 100 each 3  . metFORMIN (GLUCOPHAGE) 500 MG tablet Take 500 mg by mouth daily.    .  simvastatin (ZOCOR) 20 MG tablet Take 1 tablet (20 mg total) by mouth at bedtime. 100 tablet 4   Current Facility-Administered Medications on File Prior to Visit  Medication Dose Route Frequency Provider Last Rate Last Dose  . heparin lock flush 100 unit/mL  500 Units Intravenous Once Brunetta Genera, MD      . sodium chloride 0.9 % injection 10 mL  10 mL Intravenous PRN Brunetta Genera, MD        Allergies  Allergen Reactions  . Prochlorperazine Other (See Comments)    ARRHYTHMIAS  . Morphine And Related     Shuts bladder down.   Family History  Problem Relation Age of Onset  . Heart attack Father   . Alcoholism Father   . Obesity Father   . Asthma Sister   . Cancer Daughter        carcinoid tumor   PE: BP 128/84   Pulse 71   Ht 5' 8" (1.727 m)   Wt 194 lb 6.4 oz (88.2 kg)   SpO2 97%   BMI 29.56 kg/m  Wt Readings from Last 3 Encounters:  06/05/17 194 lb 6.4 oz (88.2 kg)  04/09/17 198 lb 9.6 oz (90.1 kg)  03/02/17 195 lb 12.8 oz (88.8 kg)   Constitutional: overweight, in NAD Eyes: PERRLA, EOMI, no exophthalmos ENT: moist mucous membranes, no thyromegaly, no cervical lymphadenopathy Cardiovascular: RRR, No MRG Respiratory: CTA B Gastrointestinal: abdomen soft, NT, ND, BS+ Musculoskeletal: no deformities, strength intact in all 4 Skin: moist, warm, no rashes Neurological: no tremor with outstretched hands, DTR normal in all 4  ASSESSMENT: 1. DM2, insulin-dependent, uncontrolled, without Long term complications, but with hypoglycemia  2. HL  3.  Overweight  PLAN:  1. Patient with long-standing, uncontrolled, type 2 diabetes, with improved diet at last visit and cutting out Kool-Aid and regular sodas.  As a consequence, his HbA1c was better, at 6.9%.  He had dizziness with metformin, so he is rotating doses between [phone] mg with dinner, depending on his symptoms. - At this visit, sugars are slightly higher, as he tells me that he is still drinking  Kool-Aid.  Again strongly advised him to stop. - We also discussed about the possibility of starting an SGL T2 inhibitor and I explained the benefits and possible side effects - He agrees to start this.  We will start a low dose and check a BMP when he comes back. - I suggested to:  Patient Instructions  Please  continue: - Basaglar 16 units at bedtime. - Metformin ER [phone] mg with dinner   Please start: - Jardiance 10 mg before b'fast  Please return in 3 months with your sugar log.   - today, HbA1c is 7.2% (higher) - continue checking sugars at different times of the day - check 1x a day, rotating checks - advised for yearly eye exams >> he is not UTD - Return to clinic in 3 mo with sugar log    2. HL - Reviewed latest lipid panel from 10/2016: LDL at goal - Continues Zocor without side effects.  3.  Overweight -Lost 4 pounds since last visit -We will start Jardiance, which would also help   Philemon Kingdom, MD PhD [facility]

## 2017-06-18 ENCOUNTER — Encounter: Payer: Self-pay | Admitting: Family Medicine

## 2017-06-18 ENCOUNTER — Ambulatory Visit (INDEPENDENT_AMBULATORY_CARE_PROVIDER_SITE_OTHER): Payer: 59 | Admitting: Family Medicine

## 2017-06-18 VITALS — BP 100/78 | HR 88 | Temp 98.2°F | Ht 68.0 in | Wt 193.0 lb

## 2017-06-18 DIAGNOSIS — Z0000 Encounter for general adult medical examination without abnormal findings: Secondary | ICD-10-CM

## 2017-06-18 DIAGNOSIS — R351 Nocturia: Secondary | ICD-10-CM | POA: Diagnosis not present

## 2017-06-18 DIAGNOSIS — E785 Hyperlipidemia, unspecified: Secondary | ICD-10-CM | POA: Diagnosis not present

## 2017-06-18 DIAGNOSIS — E11649 Type 2 diabetes mellitus with hypoglycemia without coma: Secondary | ICD-10-CM | POA: Diagnosis not present

## 2017-06-18 DIAGNOSIS — Z794 Long term (current) use of insulin: Secondary | ICD-10-CM

## 2017-06-18 DIAGNOSIS — N401 Enlarged prostate with lower urinary tract symptoms: Secondary | ICD-10-CM

## 2017-06-18 DIAGNOSIS — I10 Essential (primary) hypertension: Secondary | ICD-10-CM

## 2017-06-18 LAB — CBC WITH DIFFERENTIAL/PLATELET
BASOS PCT: 0.4 % (ref 0.0–3.0)
Basophils Absolute: 0 10*3/uL (ref 0.0–0.1)
EOS ABS: 0 10*3/uL (ref 0.0–0.7)
Eosinophils Relative: 0.5 % (ref 0.0–5.0)
HEMATOCRIT: 48.5 % (ref 39.0–52.0)
Hemoglobin: 16.9 g/dL (ref 13.0–17.0)
LYMPHS PCT: 18.3 % (ref 12.0–46.0)
Lymphs Abs: 0.9 10*3/uL (ref 0.7–4.0)
MCHC: 34.9 g/dL (ref 30.0–36.0)
MCV: 94.8 fl (ref 78.0–100.0)
Monocytes Absolute: 0.4 10*3/uL (ref 0.1–1.0)
Monocytes Relative: 8 % (ref 3.0–12.0)
Neutro Abs: 3.7 10*3/uL (ref 1.4–7.7)
Neutrophils Relative %: 72.8 % (ref 43.0–77.0)
Platelets: 132 10*3/uL — ABNORMAL LOW (ref 150.0–400.0)
RBC: 5.11 Mil/uL (ref 4.22–5.81)
RDW: 14.9 % (ref 11.5–15.5)
WBC: 5.1 10*3/uL (ref 4.0–10.5)

## 2017-06-18 LAB — MICROALBUMIN / CREATININE URINE RATIO
Creatinine,U: 93.2 mg/dL
Microalb Creat Ratio: 1.7 mg/g (ref 0.0–30.0)
Microalb, Ur: 1.6 mg/dL (ref 0.0–1.9)

## 2017-06-18 LAB — BASIC METABOLIC PANEL
BUN: 15 mg/dL (ref 6–23)
CHLORIDE: 103 meq/L (ref 96–112)
CO2: 29 meq/L (ref 19–32)
Calcium: 9.4 mg/dL (ref 8.4–10.5)
Creatinine, Ser: 1 mg/dL (ref 0.40–1.50)
GFR: 77.67 mL/min (ref >60.00)
GLUCOSE: 138 mg/dL — AB (ref 70–99)
POTASSIUM: 4.6 meq/L (ref 3.5–5.1)
SODIUM: 141 meq/L (ref 135–145)

## 2017-06-18 LAB — HEPATIC FUNCTION PANEL
ALT: 23 U/L (ref 0–53)
AST: 23 U/L (ref 0–37)
Albumin: 4.3 g/dL (ref 3.5–5.2)
Alkaline Phosphatase: 64 U/L (ref 39–117)
BILIRUBIN DIRECT: 0.2 mg/dL (ref 0.0–0.3)
BILIRUBIN TOTAL: 1 mg/dL (ref 0.2–1.2)
TOTAL PROTEIN: 6.7 g/dL (ref 6.0–8.3)

## 2017-06-18 LAB — PSA: PSA: 3.41 ng/mL (ref 0.10–4.00)

## 2017-06-18 LAB — LIPID PANEL
Cholesterol: 143 mg/dL (ref 0–200)
HDL: 51.5 mg/dL (ref >39.00)
LDL CALC: 72 mg/dL (ref 0–99)
NonHDL: 91.28
Total CHOL/HDL Ratio: 3
Triglycerides: 97 mg/dL (ref 0.0–149.0)
VLDL: 19.4 mg/dL (ref 0.0–40.0)

## 2017-06-18 LAB — TSH: TSH: 3 u[IU]/mL (ref 0.35–4.50)

## 2017-06-18 NOTE — Progress Notes (Signed)
David Carr is a 72 year old married male nonsmoker who comes in today for annual physical examination the cousin of the following problems  He has type 1 diabetes that Dr. Lorie Carr is now helping manage. His last A1c 2 weeks ago was 7.2%  He takes Tenormin 6.25 mg daily for hypertension. BP at home is running 120/80 did today it's 100/78 and he was lightheaded when he stood up. Advised if this continues to stop the Tenormin  He takes 20 mg Zocor daily and an aspirin tablet because of a history of hyperlipidemia. We'll check labs today  His AML was [facility] and now is followed here. He still well no complications  Last year we noticed his mitral valve murmur got worse. His subsequent had surgical valve replacement and PFO closure by Dr. Ricard Carr. He's done well has had no complications  He gets routine eye care, dental,,,,,,,, teeth removed,,,,,, upper and lower dentures by Dr. Gloriann Carr,,,,, doing well no complications  Colonoscopy 2009 August. He's due for follow-up colonoscopy this year  Vaccinations up-to-date he's had his first shingles vaccine. Second due in a couple weeks.  Social history.... Married lives here in [place] retired walks his [place] puppy twice a day now.  14 point review of systems you know otherwise negative  Cognitive function normal he walks daily home health safety reviewed no issues identified, no guns in the house, he does have a healthcare power of attorney and living well.  Vs BP 100/78 (BP Location: Left Arm, Patient Position: Sitting, Cuff Size: Large)   Pulse 88   Temp 98.2 F (36.8 C) (Oral)   Ht 5\' 8"  (1.727 m)   Wt 193 lb (87.5 kg)   BMI 29.35 kg/m  Well-developed well-nourished no acute distress vital signs stable he is afebrile. HEENT were negative except for some very juvenile cataracts. Neck was supple thyroid not enlarged no carotid bruits. Cardiopulmonary exam normal except for scars anterior chest wall from previous surgery.  Abdominal exam negative except for slight umbilical hernia. Genitalia normal circumcised male. Rectal normal stool guaiac-negative prostate smooth nonnodular 1+ BPH  Extremities normal skin normal peripheral pulses normal  #1 diabetes type 1 at goal,,,,,,,, continue current therapy followed by endocrinology  #2 hypertension,,,,,,, continue Tenormin 6.25 mg,,,,, monitor BP at home  #3 hyperlipidemia,,,,,,, continue Zocor and aspirin check labs  #4 status post treatment for AML........ followed here but at the oncology center by Dr. Cletus Carr  #5 status post mitral valve repair and closure of PFO by Dr. Ricard Carr.......... asymptomatic February Lysol 819.Marland Kitchen

## 2017-06-18 NOTE — Patient Instructions (Signed)
Labs today............Marland Kitchen we will call you if is anything abnormal  While on gone this summer if you have any healthcare needs please see Dr. Grier Mitts here at the office  Follow-up in one year for general physical exam sooner if any problems  Continue walking the dog !!!!!!!!!!!!!!!!!

## 2017-06-19 ENCOUNTER — Ambulatory Visit: Payer: 59 | Admitting: Diagnostic Neuroimaging

## 2017-07-20 ENCOUNTER — Other Ambulatory Visit (HOSPITAL_COMMUNITY): Payer: Self-pay

## 2017-07-23 ENCOUNTER — Encounter: Payer: Self-pay | Admitting: Thoracic Surgery (Cardiothoracic Vascular Surgery)

## 2017-07-30 ENCOUNTER — Ambulatory Visit: Payer: 59 | Admitting: Thoracic Surgery (Cardiothoracic Vascular Surgery)

## 2017-07-30 ENCOUNTER — Ambulatory Visit: Payer: 59 | Admitting: Family Medicine

## 2017-07-30 ENCOUNTER — Encounter: Payer: Self-pay | Admitting: Family Medicine

## 2017-07-30 ENCOUNTER — Encounter: Payer: Self-pay | Admitting: Thoracic Surgery (Cardiothoracic Vascular Surgery)

## 2017-07-30 VITALS — BP 100/62 | HR 52 | Temp 98.6°F | Ht 68.0 in | Wt 185.2 lb

## 2017-07-30 VITALS — BP 90/54 | HR 66 | Resp 20 | Ht 68.0 in | Wt 185.0 lb

## 2017-07-30 DIAGNOSIS — J069 Acute upper respiratory infection, unspecified: Secondary | ICD-10-CM | POA: Diagnosis not present

## 2017-07-30 DIAGNOSIS — Z9889 Other specified postprocedural states: Secondary | ICD-10-CM

## 2017-07-30 DIAGNOSIS — I080 Rheumatic disorders of both mitral and aortic valves: Secondary | ICD-10-CM | POA: Diagnosis not present

## 2017-07-30 DIAGNOSIS — I059 Rheumatic mitral valve disease, unspecified: Secondary | ICD-10-CM

## 2017-07-30 DIAGNOSIS — E11649 Type 2 diabetes mellitus with hypoglycemia without coma: Secondary | ICD-10-CM | POA: Diagnosis not present

## 2017-07-30 DIAGNOSIS — H6982 Other specified disorders of Eustachian tube, left ear: Secondary | ICD-10-CM

## 2017-07-30 DIAGNOSIS — I7101 Dissection of thoracic aorta: Secondary | ICD-10-CM

## 2017-07-30 DIAGNOSIS — I10 Essential (primary) hypertension: Secondary | ICD-10-CM | POA: Diagnosis not present

## 2017-07-30 DIAGNOSIS — Z794 Long term (current) use of insulin: Secondary | ICD-10-CM | POA: Diagnosis not present

## 2017-07-30 DIAGNOSIS — I71019 Dissection of thoracic aorta, unspecified: Secondary | ICD-10-CM

## 2017-07-30 NOTE — Patient Instructions (Addendum)

## 2017-07-30 NOTE — Progress Notes (Signed)
   Subjective:    Patient ID: David Carr., male    DOB: 18-Dec-1945, 72 y.o.   MRN: [id number]  HPI Here for one week of stuffy head, pressure in the ears, PND, and a dry cough. Now for 2 days he has trouble hearing out of the left ear. No ear pain. No fever. He has been using Mucinex DM. He also asks if his BP is too low. He was seen at Dr. Guy Sandifer office this morning and it was 90/54. Here today it has come up a little. He feels fine as far as that goes, no chest pain or SOB or lightheadedness. He also was started oin Jardiance a few weeks ago by Dr. Cruzita Lederer for his diabetes, and he asks if this could be affecting the BP.    Review of Systems  Constitutional: Negative.   HENT: Positive for congestion, hearing loss, postnasal drip, sinus pressure and sinus pain. Negative for ear pain and sore throat.   Eyes: Negative.   Respiratory: Positive for cough.   Cardiovascular: Negative.        Objective:   Physical Exam  Constitutional: He is oriented to person, place, and time. He appears well-developed and well-nourished.  HENT:  Right Ear: External ear normal.  Left Ear: External ear normal.  Nose: Nose normal.  Mouth/Throat: Oropharynx is clear and moist.  Eyes: Conjunctivae are normal.  Neck: No thyromegaly present.  Cardiovascular: Normal rate, regular rhythm, normal heart sounds and intact distal pulses.  Pulmonary/Chest: Effort normal and breath sounds normal. No stridor. No respiratory distress. He has no wheezes. He has no rales.  Lymphadenopathy:    He has no cervical adenopathy.  Neurological: He is alert and oriented to person, place, and time.          Assessment & Plan:  He has a viral URI with some eustachian tube dysfunction. Try Claritin D for a few days for the ear problems and Delsym for the cough. His BP seems to be doing fine but he will let us know if he develops any hypotensive symptoms. I reassured him that the Jardiance would not affect the BP either  way and I encouraged him to take it as prescribed.  Alysia Penna, MD

## 2017-07-30 NOTE — Progress Notes (Signed)
[address]       [place] [address]             [phone]     CARDIOTHORACIC SURGERY OFFICE NOTE  Referring Provider is Minus Breeding, MD PCP is Dorena Cookey, MD   HPI:  Patient is a 72 year old male with history of mitral valve prolapse, hypertension, type 2 diabetes mellitus, acute myeloid leukemia in remission, hyperlipidemia, and kidney stones who returns to our office today for routine follow-up more than 1 year status post minimally invasive mitral valve repair on Jun 21, 2016 for severe symptomatic primary mitral regurgitation. Routine follow-up echocardiogram performed September 05, 2016 revealed intact mitral valve repair with no residual mitral regurgitation.  Left ventricular systolic function was normal with ejection fraction estimated 55 to 60%.  Mean transvalvular gradient across the mitral valve was estimated only 4 mmHg.   He was last seen here in our office on October 23, 2016 at which time he was doing well.  Since then he has done very well from a cardiac standpoint.  He completed outpatient cardiac rehab program.  He was last seen in follow-up by Dr. Percival Spanish in February 2019 he returns to our office today for routine follow-up.  He reports that he is doing very well although he states that approximately 2 weeks ago he came down with upper respiratory tract infection.  Prior to that he has had no problems.  He reports that his exercise tolerance is "much improved" in comparison with prior to surgery.  He reports that he gets short of breath only with strenuous physical exertion and he has no shortness of breath with ordinary activities.  He reports no other significant physical limitations and overall he is very pleased with his long-term result.   Current Outpatient Medications  Medication Sig Dispense Refill  . acetaminophen (TYLENOL) 500 MG tablet Take 2 tablets (1,000 mg total) by mouth every 6 (six) hours as needed. 30 tablet 0  . aspirin 81  MG tablet Take 81 mg by mouth as needed.     Marland Kitchen atenolol (TENORMIN) 25 MG tablet Take 0.5 tablets (12.5 mg total) by mouth daily. 100 tablet 4  . empagliflozin (JARDIANCE) 10 MG TABS tablet Take 10 mg by mouth daily. 30 tablet 5  . Insulin Glargine (BASAGLAR KWIKPEN) 100 UNIT/ML SOPN Inject 0.16 mLs (16 Units total) into the skin at bedtime. 10 pen 3  . Insulin Pen Needle (BD PEN NEEDLE NANO U/F) 32G X 4 MM MISC USE 1X A DAY 100 each 3  . metFORMIN (GLUCOPHAGE) 500 MG tablet Take 500 mg by mouth daily.    . simvastatin (ZOCOR) 20 MG tablet Take 1 tablet (20 mg total) by mouth at bedtime. 100 tablet 4   No current facility-administered medications for this visit.    Facility-Administered Medications Ordered in Other Visits  Medication Dose Route Frequency Provider Last Rate Last Dose  . heparin lock flush 100 unit/mL  500 Units Intravenous Once Brunetta Genera, MD      . sodium chloride 0.9 % injection 10 mL  10 mL Intravenous PRN Brunetta Genera, MD          Physical Exam:   BP (!) 90/54   Pulse 66   Resp 20   Ht 5\' 8"  (1.727 m)   Wt 185 lb (83.9 kg)   SpO2 97% Comment: RA  BMI 28.13 kg/m   General:  Well-appearing  Chest:   Clear to  auscultation  CV:   Regular rate and rhythm without murmur  Incisions:  Completely healed  Abdomen:  Soft nontender  Extremities:  Warm and well-perfused, no edema  Diagnostic Tests:  Transthoracic Echocardiography  Patient:    Mihailo, Sage MR #:       [id number] Study Date: 09/05/2016 Gender:     M Age:        36 Height:     172.7 cm Weight:     86.2 kg BSA:        2.05 m^2 Pt. Status: Room:   ATTENDING    Fransico Him, MD  [facility]  REFERRING    Kerin Ransom K  SONOGRAPHER  Cindy Hazy, RDCS  PERFORMING   [facility] Outpatient  cc:  ------------------------------------------------------------------- LV EF: 55% -    60%  ------------------------------------------------------------------- Indications:      R06.00 Dyspnea.  ------------------------------------------------------------------- History:   PMH:  Acquired from the patient and from the patient&'s chart.  PMH:  Murmur. Dyspnea. History of Mitral Valve Prolapse.   ------------------------------------------------------------------- Study Conclusions  - Left ventricle: The cavity size was normal. There was severe   focal basal and mild concentric hypertrophy. Systolic function   was normal. The estimated ejection fraction was in the range of   55% to 60%. Wall motion was normal; there were no regional wall   motion abnormalities. There was an increased relative   contribution of atrial contraction to ventricular filling.   Doppler parameters are consistent with abnormal left ventricular   relaxation (grade 1 diastolic dysfunction). Doppler parameters   are consistent with high ventricular filling pressure. - Aortic valve: Trileaflet; normal thickness, mildly calcified   leaflets. - Aorta: Aortic root dimension: 38 mm (ED). - Aortic root: The aortic root was mildly dilated. - Mitral valve: S/P Mitral Valve repair. Mean gradient (D): 4 mm   Hg. Valve area by pressure half-time: 1.82 cm^2. - Atrial septum: There was increased thickness of the septum,   consistent with lipomatous hypertrophy. - Pulmonary arteries: Systolic pressure could not be accurately   estimated.  ------------------------------------------------------------------- Labs, prior tests, procedures, and surgery: Status post Mitral Valve Repair.  ------------------------------------------------------------------- Study data:   Study status:  Routine.  Procedure:  The patient reported no pain pre or post test. Transthoracic echocardiography for left ventricular function evaluation, for right ventricular function evaluation, and for assessment of valvular function.  Image quality was adequate.  Study completion:  There were no complications.          Transthoracic echocardiography.  M-mode, complete 2D, spectral Doppler, and color Doppler.  Birthdate: Patient birthdate: Oct 15, 1945.  Age:  Patient is 72 yr old.  Sex: Gender: male.    BMI: 28.9 kg/m^2.  Blood pressure:     138/74 Patient status:  Outpatient.  Study date:  Study date: 09/05/2016. Study time: 11:34 AM.  Location:  [facility]  -------------------------------------------------------------------  ------------------------------------------------------------------- Left ventricle:  The cavity size was normal. There was severe focal basal and mild concentric hypertrophy. Systolic function was normal. The estimated ejection fraction was in the range of 55% to 60%. Wall motion was normal; there were no regional wall motion abnormalities. There was an increased relative contribution of atrial contraction to ventricular filling. Doppler parameters are consistent with abnormal left ventricular relaxation (grade 1 diastolic dysfunction). Doppler parameters are consistent with high ventricular filling pressure.  ------------------------------------------------------------------- Aortic valve:   Trileaflet; normal thickness, mildly calcified leaflets. Mobility was not restricted.  Doppler:  Transvalvular velocity was within the normal range. There was no stenosis. There was no regurgitation.  ------------------------------------------------------------------- Aorta:  Aortic root: The aortic root was mildly dilated.  ------------------------------------------------------------------- Mitral valve:  S/P Mitral Valve repair. Leaflet separation was normal. Mobility was not restricted.  Doppler:  Transvalvular velocity was within the normal range. There was no evidence for stenosis. There was no regurgitation.    Valve area by pressure half-time: 1.82 cm^2. Indexed valve area by  pressure half-time: 0.89 cm^2/m^2. Valve area by continuity equation (using LVOT flow): 1.86 cm^2. Indexed valve area by continuity equation (using LVOT flow): 0.91 cm^2/m^2.    Mean gradient (D): 4 mm Hg. Peak gradient (D): 4 mm Hg.  ------------------------------------------------------------------- Left atrium:  The atrium was normal in size.  ------------------------------------------------------------------- Atrial septum:  There was increased thickness of the septum, consistent with lipomatous hypertrophy.  ------------------------------------------------------------------- Right ventricle:  The cavity size was normal. Wall thickness was normal. Systolic function was normal.  ------------------------------------------------------------------- Pulmonic valve:    Structurally normal valve.   Cusp separation was normal.  Doppler:  Transvalvular velocity was within the normal range. There was no evidence for stenosis. There was no regurgitation.  ------------------------------------------------------------------- Tricuspid valve:   Structurally normal valve.    Doppler: Transvalvular velocity was within the normal range. There was no regurgitation.  ------------------------------------------------------------------- Pulmonary artery:   The main pulmonary artery was normal-sized. Systolic pressure could not be accurately estimated.  ------------------------------------------------------------------- Right atrium:  The atrium was normal in size.  ------------------------------------------------------------------- Pericardium:  There was no pericardial effusion.  ------------------------------------------------------------------- Systemic veins: Inferior vena cava: The vessel was normal in size.  ------------------------------------------------------------------- Measurements   Left ventricle                            Value          Reference  LV ID, ED, PLAX  chordal           (L)     40.5  mm       43 - 52  LV ID, ES, PLAX chordal                   30    mm       23 - 38  LV fx shortening, PLAX chordal    (L)     26    %        >=29  LV PW thickness, ED                       11.3  mm       ---------  IVS/LV PW ratio, ED               (H)     1.31           <=1.3  Stroke volume, 2D                         58    ml       ---------  Stroke volume/bsa, 2D                     28    ml/m^2   ---------  LV e&', lateral                            5.87  cm/s     ---------  LV E/e&', lateral                          16.06          ---------  LV e&', medial                             4.81  cm/s     ---------  LV E/e&', medial                           19.6           ---------  LV e&', average                            5.34  cm/s     ---------  LV E/e&', average                          17.66          ---------    Ventricular septum                        Value          Reference  IVS thickness, ED                         14.8  mm       ---------    LVOT                                      Value          Reference  LVOT ID, S                                24    mm       ---------  LVOT area                                 4.52  cm^2     ---------  LVOT ID                                   24    mm       ---------  LVOT peak velocity, S                     65.3  cm/s     ---------  LVOT mean velocity, S                     50.5  cm/s     ---------  LVOT VTI, S                               12.9  cm       ---------  LVOT peak gradient, S  2     mm Hg    ---------  Stroke volume (SV), LVOT DP               58.4  ml       ---------  Stroke index (SV/bsa), LVOT DP            28.4  ml/m^2   ---------    Aorta                                     Value          Reference  Aortic root ID, ED                        38    mm       ---------  Ascending aorta ID, A-P, S                33    mm       ---------    Left atrium                                Value          Reference  LA ID, A-P, ES                            35    mm       ---------  LA ID/bsa, A-P                            1.7   cm/m^2   <=2.2  LA volume, S                              49    ml       ---------  LA volume/bsa, S                          23.8  ml/m^2   ---------  LA volume, ES, 1-p A4C                    55    ml       ---------  LA volume/bsa, ES, 1-p A4C                26.8  ml/m^2   ---------  LA volume, ES, 1-p A2C                    41    ml       ---------  LA volume/bsa, ES, 1-p A2C                20    ml/m^2   ---------    Mitral valve                              Value          Reference  Mitral E-wave peak velocity               94.3  cm/s     ---------  Mitral A-wave peak velocity               130   cm/s     ---------  Mitral mean velocity, D                   90.8  cm/s     ---------  Mitral deceleration time          (H)     296   ms       150 - 230  Mitral pressure half-time                 110   ms       ---------  Mitral mean gradient, D                   4     mm Hg    ---------  Mitral peak gradient, D                   4     mm Hg    ---------  Mitral E/A ratio, peak                    0.7            ---------  Mitral valve area, PHT, DP                1.82  cm^2     ---------  Mitral valve area/bsa, PHT, DP            0.89  cm^2/m^2 ---------  Mitral valve area, LVOT                   1.86  cm^2     ---------  continuity  Mitral valve area/bsa, LVOT               0.91  cm^2/m^2 ---------  continuity  Mitral annulus VTI, D                     31.3  cm       ---------    Right ventricle                           Value          Reference  RV s&', lateral, S                         8.39  cm/s     ---------  Legend: (L)  and  (H)  mark values outside specified reference range.  ------------------------------------------------------------------- Prepared and Electronically Authenticated by  Fransico Him,  MD 2018-07-24T14:23:44   Impression:  Patient is doing very well more than 1 year status post minimally invasive mitral valve repair  Plan:  We have not recommended any change the patient's current medications.  Patient may resume unrestricted physical activity.  The patient has been reminded regarding the importance of dental hygiene and the lifelong need for antibiotic prophylaxis for all dental cleanings and other related invasive procedures.  Intermittently with Dr. Percival Spanish and return to our office in the future only should specific problems or questions arise.  I spent in excess of 15 minutes during the conduct of this office consultation and >50% of this time involved direct face-to-face encounter with the patient for counseling and/or coordination of their care.    Braulio Conte  Keturah Barre, MD 07/30/2017 2:04 PM

## 2017-08-03 ENCOUNTER — Telehealth: Payer: Self-pay | Admitting: Hematology and Oncology

## 2017-08-03 NOTE — Telephone Encounter (Signed)
Spoke to patient regarding upcoming July appointments updates per 6/21 sch message

## 2017-08-07 ENCOUNTER — Encounter (HOSPITAL_COMMUNITY): Payer: Self-pay | Admitting: Radiology

## 2017-08-08 ENCOUNTER — Telehealth (HOSPITAL_COMMUNITY): Payer: Self-pay | Admitting: Cardiology

## 2017-08-08 ENCOUNTER — Telehealth: Payer: Self-pay | Admitting: *Deleted

## 2017-08-08 ENCOUNTER — Encounter (HOSPITAL_COMMUNITY): Payer: Self-pay | Admitting: Cardiology

## 2017-08-08 NOTE — Telephone Encounter (Signed)
Copied from Rule [id number]. Topic: General - Other >> Aug 08, 2017  3:25 PM Ajooni Karam Stare wrote:  Pt call to say he has been using over the counter for his ear issue and Dr Sarajane Jews told him if if does not work he will issue him a Careers information officer. He has been using over the counter for 10 days and still can not hear   Pharmacy [facility]

## 2017-08-08 NOTE — Telephone Encounter (Signed)
06/21/17-----(06/20/17 Cancel Rsn: Patient (pt will cal back to r/s) 07/16/17 [email or web address] 07/30/17 Called pt on *[phone]*number and lmsg for him to CB to r/s echo..RG  07/06/16 @ 2:40PM -- PATIENT REQUESTED HIS PHONE NUMBER BE REMOVED FROM HIS RECORDT KF pt phone [phone]. pt states he does not want reminder calls.PATIENT REFUSES TO GIVE OUT TELEPHONE NUMBER 01/14/2015  Will send patient a letter.

## 2017-08-08 NOTE — Telephone Encounter (Signed)
Call in a Medrol dose pack  

## 2017-08-09 ENCOUNTER — Telehealth: Payer: Self-pay | Admitting: Cardiology

## 2017-08-09 MED ORDER — METHYLPREDNISOLONE 4 MG PO TBPK: ORAL_TABLET | ORAL | 0 refills | Status: DC

## 2017-08-09 NOTE — Telephone Encounter (Signed)
New Message   Pt states that Dr. Ricard Dillon told him that he does not need an echo and he does not want to be contacted anymore about getting schedule. States he refused multiple times but is still receiving phone calls

## 2017-08-09 NOTE — Telephone Encounter (Signed)
Please make sure this is removed from the work queue.

## 2017-08-09 NOTE — Telephone Encounter (Signed)
Medication filled to pharmacy as requested. Unable to call patient as our phone lines are down and patient does not have MyChart. Sent message to pharmacy w/ script asking them to call patient when rx is ready for pick up.

## 2017-08-09 NOTE — Telephone Encounter (Signed)
Will forward to Dr Percival Spanish to let him know pt is refusing to schedule f/u echo ./cy

## 2017-08-13 NOTE — Telephone Encounter (Signed)
Pt was removed from call back work queue

## 2017-08-28 ENCOUNTER — Telehealth: Payer: Self-pay | Admitting: Hematology and Oncology

## 2017-08-28 NOTE — Telephone Encounter (Signed)
Tried to reach regarding voicemail °

## 2017-08-30 ENCOUNTER — Inpatient Hospital Stay: Payer: 59 | Admitting: Hematology and Oncology

## 2017-08-30 ENCOUNTER — Telehealth: Payer: Self-pay | Admitting: Internal Medicine

## 2017-08-30 ENCOUNTER — Other Ambulatory Visit: Payer: Self-pay | Admitting: Hematology and Oncology

## 2017-08-30 ENCOUNTER — Inpatient Hospital Stay: Payer: 59 | Attending: Hematology and Oncology

## 2017-08-30 ENCOUNTER — Ambulatory Visit: Payer: Self-pay | Admitting: Hematology and Oncology

## 2017-08-30 ENCOUNTER — Other Ambulatory Visit: Payer: Self-pay

## 2017-08-30 ENCOUNTER — Encounter: Payer: Self-pay | Admitting: Hematology and Oncology

## 2017-08-30 DIAGNOSIS — Z794 Long term (current) use of insulin: Secondary | ICD-10-CM

## 2017-08-30 DIAGNOSIS — Z7982 Long term (current) use of aspirin: Secondary | ICD-10-CM | POA: Diagnosis not present

## 2017-08-30 DIAGNOSIS — H9192 Unspecified hearing loss, left ear: Secondary | ICD-10-CM | POA: Insufficient documentation

## 2017-08-30 DIAGNOSIS — Z79899 Other long term (current) drug therapy: Secondary | ICD-10-CM

## 2017-08-30 DIAGNOSIS — E11649 Type 2 diabetes mellitus with hypoglycemia without coma: Secondary | ICD-10-CM | POA: Insufficient documentation

## 2017-08-30 DIAGNOSIS — Z9221 Personal history of antineoplastic chemotherapy: Secondary | ICD-10-CM | POA: Insufficient documentation

## 2017-08-30 DIAGNOSIS — C9201 Acute myeloblastic leukemia, in remission: Secondary | ICD-10-CM

## 2017-08-30 DIAGNOSIS — D696 Thrombocytopenia, unspecified: Secondary | ICD-10-CM | POA: Diagnosis not present

## 2017-08-30 DIAGNOSIS — H918X2 Other specified hearing loss, left ear: Secondary | ICD-10-CM | POA: Diagnosis not present

## 2017-08-30 LAB — CBC WITH DIFFERENTIAL/PLATELET
Basophils Absolute: 0 10*3/uL (ref 0.0–0.1)
Basophils Relative: 1 %
EOS ABS: 0 10*3/uL (ref 0.0–0.5)
EOS PCT: 1 %
HCT: 49.4 % (ref 38.4–49.9)
Hemoglobin: 16.7 g/dL (ref 13.0–17.1)
Lymphocytes Relative: 18 %
Lymphs Abs: 0.8 10*3/uL — ABNORMAL LOW (ref 0.9–3.3)
MCH: 31.6 pg (ref 27.2–33.4)
MCHC: 33.7 g/dL (ref 32.0–36.0)
MCV: 93.7 fL (ref 79.3–98.0)
MONO ABS: 0.3 10*3/uL (ref 0.1–0.9)
Monocytes Relative: 6 %
Neutro Abs: 3.2 10*3/uL (ref 1.5–6.5)
Neutrophils Relative %: 74 %
PLATELETS: 130 10*3/uL — AB (ref 140–400)
RBC: 5.27 MIL/uL (ref 4.20–5.82)
RDW: 14.2 % (ref 11.0–14.6)
WBC: 4.4 10*3/uL (ref 4.0–10.3)

## 2017-08-30 LAB — COMPREHENSIVE METABOLIC PANEL
ALT: 30 U/L (ref 0–44)
ANION GAP: 8 (ref 5–15)
AST: 25 U/L (ref 15–41)
Albumin: 3.8 g/dL (ref 3.5–5.0)
Alkaline Phosphatase: 77 U/L (ref 38–126)
BUN: 14 mg/dL (ref 8–23)
CALCIUM: 9.6 mg/dL (ref 8.9–10.3)
CO2: 26 mmol/L (ref 22–32)
Chloride: 105 mmol/L (ref 98–111)
Creatinine, Ser: 1 mg/dL (ref 0.61–1.24)
GFR calc Af Amer: >60 mL/min (ref >60)
GLUCOSE: 184 mg/dL — AB (ref 70–99)
Potassium: 4 mmol/L (ref 3.5–5.1)
SODIUM: 139 mmol/L (ref 135–145)
TOTAL PROTEIN: 6.9 g/dL (ref 6.5–8.1)
Total Bilirubin: 0.7 mg/dL (ref 0.3–1.2)

## 2017-08-30 NOTE — Telephone Encounter (Signed)
Pt is aware.  

## 2017-08-30 NOTE — Assessment & Plan Note (Signed)
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

## 2017-08-30 NOTE — Assessment & Plan Note (Signed)
He is doing very well He has lost a lot of weight since last time I saw him I congratulated his efforts I recommend consultation with his endocrinologist to see if the dosage of his insulin needs to be adjusted

## 2017-08-30 NOTE — Telephone Encounter (Signed)
Please advise on below  

## 2017-08-30 NOTE — Assessment & Plan Note (Signed)
He is not symptomatic. His blood work confirmed the patient remained in remission Currently, the patient has been in remission for over 3 years I plan to see him back in 12 months

## 2017-08-30 NOTE — Progress Notes (Signed)
[facility] OFFICE PROGRESS NOTE  Patient Care Team: Dorena Cookey, MD as PCP - General  ASSESSMENT & PLAN:  AML (acute myeloid leukemia) in remission [facility]) He is not symptomatic. His blood work confirmed the patient remained in remission Currently, the patient has been in remission for over 3 years I plan to see him back in 12 months   Thrombocytopenia ([place]) The cause could be related to his underlying medical condition. It is mild and there is little change compared from previous platelet count. The patient denies recent history of bleeding such as epistaxis, hematuria or hematochezia. He is asymptomatic from the thrombocytopenia. I will observe for now.  he does not require further evaluation or treatment for this   Type 2 diabetes mellitus with hypoglycemia without coma, with long-term current use of insulin ([place]) He is doing very well He has lost a lot of weight since last time I saw him I congratulated his efforts I recommend consultation with his endocrinologist to see if the dosage of his insulin needs to be adjusted  Left ear hearing loss He has gradual improvement of his left-sided hearing since he was started on methylprednisolone Dosepak I recommend conservative management with observation only   No orders of the defined types were placed in this encounter.   INTERVAL HISTORY: Please see below for problem oriented charting. He returns for further follow-up for history of AML He feels well He was prescribed treatment for possible left eustachian tube dysfunction by his primary care doctor with steroid Dosepak His hearing has improved Denies recent infection, fever or chills He is doing very well from cardiac standpoint He is exercising He is also modifying his diet and lifestyle and managed to lost almost 15 pounds of weight since the last time I saw him The patient denies any recent signs or symptoms of bleeding such as spontaneous epistaxis,  hematuria or hematochezia.   SUMMARY OF ONCOLOGIC HISTORY:   AML (acute myeloid leukemia) in remission ([place])   09/11/2013 Bone Marrow Biopsy    Bone marrow biopsy showed AML, FLT3 negative, NPM1 positive      09/17/2013 -  Chemotherapy    He was given induction chemotherapy at [facility] (7+3)      10/01/2013 -  Chemotherapy    He had repeat induction chemotherapy due to persistent disease (5+2)      11/06/2013 Bone Marrow Biopsy    Bone marrow biopsy confirmed remission      11/13/2013 - 11/17/2013 Chemotherapy    He received cycle 1 of consolidation chemotherapy with HiDAC      12/18/2013 - 12/22/2013 Chemotherapy    He received cycle 2 of consolidation chemotherapy with HiDAC      01/23/2014 - 01/28/2014 Chemotherapy    he received cycle 3 of consolidation chemotherapy with HiDAC      03/05/2014 - 03/10/2014 Hospital Admission    he received cycle 4 of consolidation chemotherapy with HiDAC       REVIEW OF SYSTEMS:   Constitutional: Denies fevers, chills or abnormal weight loss Eyes: Denies blurriness of vision Ears, nose, mouth, throat, and face: Denies mucositis or sore throat Respiratory: Denies cough, dyspnea or wheezes Cardiovascular: Denies palpitation, chest discomfort or lower extremity swelling Gastrointestinal:  Denies nausea, heartburn or change in bowel habits Skin: Denies abnormal skin rashes Lymphatics: Denies new lymphadenopathy or easy bruising Neurological:Denies numbness, tingling or new weaknesses Behavioral/Psych: Mood is stable, no new changes  All other systems were reviewed with the patient and  are negative.  I have reviewed the past medical history, past surgical history, social history and family history with the patient and they are unchanged from previous note.  ALLERGIES:  is allergic to prochlorperazine and morphine and related.  MEDICATIONS:  Current Outpatient Medications  Medication Sig Dispense Refill  . acetaminophen (TYLENOL) 500  MG tablet Take 2 tablets (1,000 mg total) by mouth every 6 (six) hours as needed. 30 tablet 0  . aspirin 81 MG tablet Take 81 mg by mouth as needed.     Marland Kitchen atenolol (TENORMIN) 25 MG tablet Take 0.5 tablets (12.5 mg total) by mouth daily. 100 tablet 4  . empagliflozin (JARDIANCE) 10 MG TABS tablet Take 10 mg by mouth daily. 30 tablet 5  . Insulin Glargine (BASAGLAR KWIKPEN) 100 UNIT/ML SOPN Inject 0.16 mLs (16 Units total) into the skin at bedtime. 10 pen 3  . Insulin Pen Needle (BD PEN NEEDLE NANO U/F) 32G X 4 MM MISC USE 1X A DAY 100 each 3  . metFORMIN (GLUCOPHAGE) 500 MG tablet Take 500 mg by mouth daily.    . simvastatin (ZOCOR) 20 MG tablet Take 1 tablet (20 mg total) by mouth at bedtime. 100 tablet 4   No current facility-administered medications for this visit.    Facility-Administered Medications Ordered in Other Visits  Medication Dose Route Frequency Provider Last Rate Last Dose  . heparin lock flush 100 unit/mL  500 Units Intravenous Once Brunetta Genera, MD      . sodium chloride 0.9 % injection 10 mL  10 mL Intravenous PRN Brunetta Genera, MD        PHYSICAL EXAMINATION: ECOG PERFORMANCE STATUS: 0 - Asymptomatic  Vitals:   08/30/17 1226  BP: 122/79  Pulse: 90  Resp: 18  Temp: 98.3 F (36.8 C)  SpO2: 97%   Filed Weights   08/30/17 1226  Weight: 181 lb 3.2 oz (82.2 kg)    GENERAL:alert, no distress and comfortable SKIN: skin color, texture, turgor are normal, no rashes or significant lesions EYES: normal, Conjunctiva are pink and non-injected, sclera clear OROPHARYNX:no exudate, no erythema and lips, buccal mucosa, and tongue normal  NECK: supple, thyroid normal size, non-tender, without nodularity LYMPH:  no palpable lymphadenopathy in the cervical, axillary or inguinal LUNGS: clear to auscultation and percussion with normal breathing effort HEART: regular rate & rhythm and no murmurs and no lower extremity edema ABDOMEN:abdomen soft, non-tender and  normal bowel sounds Musculoskeletal:no cyanosis of digits and no clubbing  NEURO: alert & oriented x 3 with fluent speech, no focal motor/sensory deficits  LABORATORY DATA:  I have reviewed the data as listed    Component Value Date/Time   NA 139 08/30/2017 1149   NA 139 07/03/2016 1233   NA 138 12/02/2015 1335   K 4.0 08/30/2017 1149   K 4.1 12/02/2015 1335   CL 105 08/30/2017 1149   CO2 26 08/30/2017 1149   CO2 24 12/02/2015 1335   GLUCOSE 184 (H) 08/30/2017 1149   GLUCOSE 252 (H) 12/02/2015 1335   BUN 14 08/30/2017 1149   BUN 15 07/03/2016 1233   BUN 14.2 12/02/2015 1335   CREATININE 1.00 08/30/2017 1149   CREATININE 0.90 01/03/2016 1056   CREATININE 1.0 12/02/2015 1335   CALCIUM 9.6 08/30/2017 1149   CALCIUM 9.0 12/02/2015 1335   PROT 6.9 08/30/2017 1149   PROT 6.6 12/02/2015 1335   ALBUMIN 3.8 08/30/2017 1149   ALBUMIN 3.5 12/02/2015 1335   AST 25 08/30/2017 1149   AST  21 12/02/2015 1335   ALT 30 08/30/2017 1149   ALT 28 12/02/2015 1335   ALKPHOS 77 08/30/2017 1149   ALKPHOS 79 12/02/2015 1335   BILITOT 0.7 08/30/2017 1149   BILITOT 0.61 12/02/2015 1335   GFRNONAA >60 08/30/2017 1149   GFRAA >60 08/30/2017 1149    No results found for: SPEP, UPEP  Lab Results  Component Value Date   WBC 4.4 08/30/2017   NEUTROABS 3.2 08/30/2017   HGB 16.7 08/30/2017   HCT 49.4 08/30/2017   MCV 93.7 08/30/2017   PLT 130 (L) 08/30/2017      Chemistry      Component Value Date/Time   NA 139 08/30/2017 1149   NA 139 07/03/2016 1233   NA 138 12/02/2015 1335   K 4.0 08/30/2017 1149   K 4.1 12/02/2015 1335   CL 105 08/30/2017 1149   CO2 26 08/30/2017 1149   CO2 24 12/02/2015 1335   BUN 14 08/30/2017 1149   BUN 15 07/03/2016 1233   BUN 14.2 12/02/2015 1335   CREATININE 1.00 08/30/2017 1149   CREATININE 0.90 01/03/2016 1056   CREATININE 1.0 12/02/2015 1335      Component Value Date/Time   CALCIUM 9.6 08/30/2017 1149   CALCIUM 9.0 12/02/2015 1335   ALKPHOS 77  08/30/2017 1149   ALKPHOS 79 12/02/2015 1335   AST 25 08/30/2017 1149   AST 21 12/02/2015 1335   ALT 30 08/30/2017 1149   ALT 28 12/02/2015 1335   BILITOT 0.7 08/30/2017 1149   BILITOT 0.61 12/02/2015 1335      All questions were answered. The patient knows to call the clinic with any problems, questions or concerns. No barriers to learning was detected.  I spent 15 minutes counseling the patient face to face. The total time spent in the appointment was 20 minutes and more than 50% was on counseling and review of test results  Heath Lark, MD 08/30/2017 1:09 PM

## 2017-08-30 NOTE — Assessment & Plan Note (Signed)
The cause could be related to his underlying medical condition. It is mild and there is little change compared from previous platelet count. The patient denies recent history of bleeding such as epistaxis, hematuria or hematochezia. He is asymptomatic from the thrombocytopenia. I will observe for now.  he does not require further evaluation or treatment for this

## 2017-08-30 NOTE — Telephone Encounter (Signed)
Patient stated that his oncologist wanted him to call and let Dr Cruzita Lederer know that he has lost a very good amount of weight. He went from 195 in march to 181.2 today. They would like to know if he should modify his insulin, metformin, or jardiance.   Please advise [phone]

## 2017-08-30 NOTE — Telephone Encounter (Signed)
I would like to first evaluate his sugars.  He has an appointment with me in a week.  Let us wait until then.

## 2017-09-06 ENCOUNTER — Encounter: Payer: Self-pay | Admitting: Internal Medicine

## 2017-09-06 ENCOUNTER — Ambulatory Visit: Payer: 59 | Admitting: Internal Medicine

## 2017-09-06 VITALS — BP 118/70 | HR 85 | Ht 68.0 in | Wt 183.0 lb

## 2017-09-06 DIAGNOSIS — E663 Overweight: Secondary | ICD-10-CM | POA: Diagnosis not present

## 2017-09-06 DIAGNOSIS — E785 Hyperlipidemia, unspecified: Secondary | ICD-10-CM | POA: Diagnosis not present

## 2017-09-06 DIAGNOSIS — E11649 Type 2 diabetes mellitus with hypoglycemia without coma: Secondary | ICD-10-CM | POA: Diagnosis not present

## 2017-09-06 DIAGNOSIS — Z794 Long term (current) use of insulin: Secondary | ICD-10-CM

## 2017-09-06 LAB — POCT GLYCOSYLATED HEMOGLOBIN (HGB A1C): Hemoglobin A1C: 6.5 % — AB (ref 4.0–5.6)

## 2017-09-06 MED ORDER — BASAGLAR KWIKPEN 100 UNIT/ML ~~LOC~~ SOPN: 14.0000 [IU] | PEN_INJECTOR | Freq: Every day | SUBCUTANEOUS | 3 refills | Status: DC

## 2017-09-06 NOTE — Patient Instructions (Addendum)
Please continue: - Metformin ER [phone] mg with dinner  - Jardiance 10 mg before b'fast  Please decrease: - Basaglar to 14 units at bedtime.  Please return in 4 months with your sugar log.

## 2017-09-06 NOTE — Addendum Note (Signed)
Addended by: Drucilla Schmidt on: 09/06/2017 04:40 PM   Modules accepted: Orders

## 2017-09-06 NOTE — Progress Notes (Signed)
Patient ID: David Brannen [name], male   DOB: 01-25-1946, 72 y.o.   MRN: [id number]   HPI: David Caesar. is a 72 y.o.-year-old male, returning for f/u for DM2, dx at the end of the 1990s, insulin-dependent since ~2014, uncontrolled, without long term complications. Last visit 3 months ago.  Last hemoglobin A1c was: Lab Results  Component Value Date   HGBA1C 7.2 06/05/2017   HGBA1C 6.9 03/02/2017   HGBA1C 7.5 (H) 11/07/2016   Pt was on a regimen of: - Humalog 75/25 35 >> increased to 45 units at bedtime! - Metformin 500 mg 2x a day, with meals >> now 500 mg at dinnertime  Now on: - Basaglar 16 units at bedtime. - Metformin ER [phone] mg with dinner (dizziness + SOB with higher doses) - Jardiance 10 mg before b'fast  Pt checks his sugars up to 3x a day: - am: 117-143 >> 106-130, 154 >> 100-141 >> 115 - 2h after b'fast: 117-144, 275 >> 138-171 >> n/c >> 107-148 - before lunch:117-146 >> 134-144 >> 128-143 >> 107-136 - 2h after lunch: 127 >> 130, 173, 205 >> 150-195 >> 152 - before dinner: 93, 126 >> 96-146 >> 108-151 >> 78-115 - 2h after dinner: 143-164, 194 >> 119-206 >> 139-175, 182, 188 - bedtime: 148-193 >> n/c >> 134-204 >> 125 >> n/c - nighttime: n/c He has hypoglycemia awareness at 80. He had an episode of loss of consciousness when sugars reached 60 in 07/17/2016. He decreased his insulin doses then to 20 units daily, and subsequently, on 07/27/2016, to 16 units daily.  He did not document any lows since last visit, however, he feels low in the morning and has to eat something sweet. Highest sugar was 336 (trailmix) >> 188  Glucometer: Freestyle Lite  Pt's meals are: - Breakfast: coffee + nab or slice pound cake + pop tarts - Lunch: fast food or sandwich or skips - Dinner: meat + veggies  - Snacks: 1, before bedtime  he only has occasional regular sodas now.  He walks twice a day, 1.5 miles, with his black lab  -no CKD, last BUN/creatinine:  Lab Results   Component Value Date   BUN 14 08/30/2017   BUN 15 06/18/2017   CREATININE 1.00 08/30/2017   CREATININE 1.00 06/18/2017   -+ HL; last set of lipids: Lab Results  Component Value Date   CHOL 143 06/18/2017   HDL 51.50 06/18/2017   LDLCALC 72 06/18/2017   LDLDIRECT 74.4 06/19/2006   TRIG 97.0 06/18/2017   CHOLHDL 3 06/18/2017  On Zocor. - last eye exam was in 2013: No DR-  he did not schedule another appointment despite multiple promptings   - no numbness and tingling in his feet.  He has AML - in remission  Now off Coumadin.  ROS: Constitutional: no weight gain/no weight loss, no fatigue, no subjective hyperthermia, no subjective hypothermia Eyes: no blurry vision, no xerophthalmia ENT: no sore throat, no nodules palpated in throat, no dysphagia, no odynophagia, no hoarseness Cardiovascular: no CP/no SOB/no palpitations/no leg swelling Respiratory: no cough/no SOB/no wheezing Gastrointestinal: no N/no V/no D/no C/no acid reflux Musculoskeletal: no muscle aches/no joint aches Skin: no rashes, no hair loss Neurological: no tremors/no numbness/no tingling/no dizziness  I reviewed pt's medications, allergies, PMH, social hx, family hx, and changes were documented in the history of present illness. Otherwise, unchanged from my initial visit note.  Past Medical History:  Diagnosis Date  . AML (acute myeloid leukemia) in remission ([place])  oncologist-  dr Alvy Bimler-- per last note 10/ 2017 in remission 2 years   dx 09-11-2013  via bone marrow bx , FLT3 negative (NP M1 +)/  chemotherapy started 09-17-2013,  remission via marrow bx 11-06-2013,  4 cycles consolidation chemo w/ HiDAC 11-13-2013 to 03-05-2014  . BPH with urinary obstruction   . Chronic thrombocytopenic purpura ([place])   . Dental caries    periodontitis  . GERD (gastroesophageal reflux disease)   . History of kidney stones   . Hyperlipidemia   . Hypertension   . MVP (mitral valve prolapse)   . Pancytopenia, acquired  ([place])   . 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 Severe mitral regurgitation   . Type 2 diabetes mellitus with hypoglycemia, with long-term current use of insulin ([facility]    endocrinologist-  dr gCon Memos . Wears denture    upper   Past Surgical History:  Procedure Laterality Date  . CARDIAC CATHETERIZATION    . CARDIOVASCULAR STRESS TEST  07/23/2013   Low risk nuclear study w/ mild inferior ischemia/  normal LV function and wall motion, ef 69%  . CYSTOSCOPY WITH RETROGRADE PYELOGRAM, URETEROSCOPY AND STENT PLACEMENT Right 04/20/2016   Procedure: CYSTOSCOPY WITH RETROGRADE PYELOGRAM, URETEROSCOPY , STONE BASKETRY AND STENT PLACEMENT;  Surgeon: SFranchot Gallo MD;  [facility]  Service: Urology;  Laterality: Right;  . EXTRACORPOREAL SHOCK WAVE LITHOTRIPSY  yrs ago  . HOLMIUM LASER APPLICATION Right 30/02/930  Procedure: HOLMIUM LASER APPLICATION;  Surgeon: SFranchot Gallo MD;  [facility]  Service: Urology;  Laterality: Right;  . INGUINAL HERNIA REPAIR Left 1990's  . MITRAL VALVE REPAIR N/A 06/21/2016   Procedure: MINIMALLY INVASIVE MITRAL VALVE REPAIR (MVR) USING SORIN MEMO 3D SIZE 32 SEMIRIGID ANNULOPLASTY RING;  Surgeon: ORexene Alberts MD;  [place] [facility]  Service: Open Heart Surgery;  Laterality: N/A;  . MULTIPLE EXTRACTIONS WITH ALVEOLOPLASTY N/A 06/08/2016   Procedure: MULTIPLE EXTRACTION WITH ALVEOLOPLASTY AND PRE-PROSTHETIC SURGERY AS NEEDED;  Surgeon: RLenn Cal DDS;  [place] [facility]  Service: Oral Surgery;  Laterality: N/A;  . PATENT FORAMEN OVALE(PFO) CLOSURE N/A 06/21/2016   Procedure: PATENT FORAMEN OVALE (PFO) CLOSURE;  Surgeon: ORexene Alberts MD;  [place] [facility]  Service: Open Heart  Surgery;  Laterality: N/A;  . RIGHT/LEFT HEART CATH AND CORONARY ANGIOGRAPHY N/A 06/07/2016   Procedure: Right/Left Heart Cath and Coronary Angiography;  Surgeon: MSherren Mocha MD;  [facility];  Service: Cardiovascular;  Laterality: N/A;  . ROTATOR CUFF REPAIR Right [id number] . TEE WITHOUT CARDIOVERSION N/A 01/11/2016   Procedure: TRANSESOPHAGEAL ECHOCARDIOGRAM (TEE);  Surgeon: KPixie Casino MD;  [facility];  Service: Cardiovascular;  Laterality: N/A; Severe MR associated w/ flail P2 segment of MV;  no LAA thromus;  small PFO by saline microbubble contrast after valsalva; LVEF 55-60%  . TEE WITHOUT CARDIOVERSION N/A 06/21/2016   Procedure: TRANSESOPHAGEAL ECHOCARDIOGRAM (TEE);  Surgeon: ORexene Alberts MD;  [place] [facility]  Service: Open Heart Surgery;  Laterality: N/A;  . TENNIS ELBOW RELEASE/NIRSCHEL PROCEDURE Right 1990's  . TRANSTHORACIC ECHOCARDIOGRAM  12/22/2015   grade 1 diastolic dysfunction,  ef 55-60%/  moderate MVP involving posterior leaflet w/ partial flail and severe MR via CW doppler (peak gradient 360mg)/  trivial TR   Social  History   Social History  . Marital status: Married    Spouse name: N/A  . Number of children: 2   Occupational History  . accountant   Social History Main Topics  . Smoking status: Former Research scientist (life sciences)  . Smokeless tobacco: Never Used  . Alcohol use No  . Drug use: No   Current Outpatient Medications on File Prior to Visit  Medication Sig Dispense Refill  . acetaminophen (TYLENOL) 500 MG tablet Take 2 tablets (1,000 mg total) by mouth every 6 (six) hours as needed. 30 tablet 0  . aspirin 81 MG tablet Take 81 mg by mouth as needed.     Marland Kitchen atenolol (TENORMIN) 25 MG tablet Take 0.5 tablets (12.5 mg total) by mouth daily. 100 tablet 4  . empagliflozin (JARDIANCE) 10 MG TABS tablet Take 10 mg by mouth daily. 30 tablet 5  . Insulin Glargine (BASAGLAR KWIKPEN) 100 UNIT/ML SOPN Inject 0.16 mLs (16 Units total) into the skin  at bedtime. 10 pen 3  . Insulin Pen Needle (BD PEN NEEDLE NANO U/F) 32G X 4 MM MISC USE 1X A DAY 100 each 3  . metFORMIN (GLUCOPHAGE) 500 MG tablet Take 500 mg by mouth daily.    . simvastatin (ZOCOR) 20 MG tablet Take 1 tablet (20 mg total) by mouth at bedtime. 100 tablet 4   Current Facility-Administered Medications on File Prior to Visit  Medication Dose Route Frequency Provider Last Rate Last Dose  . heparin lock flush 100 unit/mL  500 Units Intravenous Once Brunetta Genera, MD      . sodium chloride 0.9 % injection 10 mL  10 mL Intravenous PRN Brunetta Genera, MD        Allergies  Allergen Reactions  . Prochlorperazine Other (See Comments)    ARRHYTHMIAS  . Morphine And Related     Shuts bladder down.   Family History  Problem Relation Age of Onset  . Heart attack Father   . Alcoholism Father   . Obesity Father   . Asthma Sister   . Cancer Daughter        carcinoid tumor   PE: BP 118/70   Pulse 85   Ht _0  (1.727 m)   Wt 183 lb (83 kg)   SpO2 96%   BMI 27.83 kg/m  Wt Readings from Last 3 Encounters:  09/06/17 183 lb (83 kg)  08/30/17 181 lb 3.2 oz (82.2 kg)  07/30/17 185 lb 3.2 oz (84 kg)   Constitutional: overweight, in NAD Eyes: PERRLA, EOMI, no exophthalmos ENT: moist mucous membranes, no thyromegaly, no cervical lymphadenopathy Cardiovascular: RRR, No MRG Respiratory: CTA B Gastrointestinal: abdomen soft, NT, ND, BS+ Musculoskeletal: no deformities, strength intact in all 4 Skin: moist, warm, no rashes Neurological: no tremor with outstretched hands, DTR normal in all 4  ASSESSMENT: 1. DM2, insulin-dependent, uncontrolled, without Long term complications, but with hypoglycemia  2. HL  3.  Overweight  PLAN:  1. Patient with long-standing, uncontrolled, type 2 diabetes, with slightly higher sugars at last visit as he was drinking Kool-Aid.  I strongly advised him to stop.  We also added Jardiance low-dose.  He had dizziness with metformin  so he is rotating doses between [phone] mg with dinner, depending on his symptoms. - Potassium and kidney function were normal after starting Jardiance - At this visit, sugars are improved at all times of the day.  However, he tells me that he feels low in the morning and he has to get something  sweet (pop tart or Oreos).  He did not check sugars fasting, in a.m. since last visit, so I do not have any evidence of hypoglycemia in a.m.  I strongly advised him to start checking whenever he feels low, especially since he is at home at that time.  To avoid further hypoglycemia, I advised him to decrease the dose of Basaglar slightly, to 14 units.  We will also continue Jardiance, which he tolerates well. - I suggested to:  Patient Instructions  Please continue: - Metformin ER [phone] mg with dinner  - Jardiance 10 mg before b'fast  Please decrease: - Basaglar to 14 units at bedtime.  Please return in 4 months with your sugar log.   - today, HbA1c is 6.5% (better) - continue checking sugars at different times of the day - check 1x a day, rotating checks - advised for yearly eye exams >> he is not UTD - again advised to schedule -  - Return to clinic in 4 mo with sugar log     2. HL - Reviewed latest lipid panel from 06/2017: all fractions at goal Lab Results  Component Value Date   CHOL 143 06/18/2017   HDL 51.50 06/18/2017   LDLCALC 72 06/18/2017   LDLDIRECT 74.4 06/19/2006   TRIG 97.0 06/18/2017   CHOLHDL 3 06/18/2017  - Continues Zocor without side effects.  3.  Overweight - he lost > 12-14 pounds since last visit, but he recently gained 2 back.  He is happy with the weight loss and would like to get to the 170s. - continue Jardiance, which should also help    Philemon Kingdom, MD PhD [facility] Endocrinology

## 2017-10-27 ENCOUNTER — Other Ambulatory Visit: Payer: Self-pay | Admitting: Internal Medicine

## 2017-11-14 ENCOUNTER — Telehealth: Payer: Self-pay | Admitting: Internal Medicine

## 2017-11-14 NOTE — Telephone Encounter (Signed)
Please advise 

## 2017-11-14 NOTE — Telephone Encounter (Signed)
Pt c/o medication issue:  1. Name of Medication: Jardiance   2. How are you currently taking this medication (dosage and times per day)? 1 pill daily  3. Are you having a reaction (difficulty breathing--STAT)? No  4. What is your medication issue? Pt is urinating every 2 hours

## 2017-11-14 NOTE — Telephone Encounter (Signed)
We will need to stop the medication if he feels this is due to [place]. Does he have any burning with urination or unintentional urine loss? If so, he may need a urinalysis to check for a UTI.

## 2017-11-15 NOTE — Telephone Encounter (Signed)
Left message for patient to return our call at [phone].  

## 2017-11-15 NOTE — Telephone Encounter (Signed)
Spoke to patient regarding his message, he states that he had significantly increased his water intake since taking this medication which led to increase urine output. He has since cut back on how much water he is drinking and his sx have resolved. Patient will continue the medication for now and review at appointment next month.

## 2017-12-17 ENCOUNTER — Ambulatory Visit (INDEPENDENT_AMBULATORY_CARE_PROVIDER_SITE_OTHER): Payer: 59 | Admitting: Family Medicine

## 2017-12-17 ENCOUNTER — Encounter: Payer: Self-pay | Admitting: Family Medicine

## 2017-12-17 VITALS — BP 108/72 | HR 87 | Temp 98.0°F | Wt 180.0 lb

## 2017-12-17 DIAGNOSIS — C9201 Acute myeloblastic leukemia, in remission: Secondary | ICD-10-CM

## 2017-12-17 DIAGNOSIS — Z9889 Other specified postprocedural states: Secondary | ICD-10-CM

## 2017-12-17 DIAGNOSIS — I10 Essential (primary) hypertension: Secondary | ICD-10-CM

## 2017-12-17 DIAGNOSIS — Z794 Long term (current) use of insulin: Secondary | ICD-10-CM

## 2017-12-17 DIAGNOSIS — E11649 Type 2 diabetes mellitus with hypoglycemia without coma: Secondary | ICD-10-CM

## 2017-12-17 NOTE — Patient Instructions (Signed)
Continue current meds follow-up with Dr. Betty Martinique

## 2017-12-17 NOTE — Progress Notes (Signed)
David Carr is a delightful 72 year old male who comes in to say goodbye  He has a history of mitral valve disease and had a valve replacement in 2018.  He is done well and has had no complications.  He is also had a history of leukemia.  Been followed in oncology and treated there is done well has had no complications.  His blood sugars under excellent control by Dr. Lorie Apley.  His A1c in July was 6.7.  Because of his profound weight loss the Jardiance was discontinued.  He currently is on low-dose insulin along with metformin 500 mg daily.  He wants to discuss the new physician.  I recommend Dr. Betty Martinique  BP 108/72 (BP Location: Right Arm, Patient Position: Sitting, Cuff Size: Large)   Pulse 87   Temp 98 F (36.7 C) (Oral)   Wt 180 lb (81.6 kg)   SpO2 97%   BMI 27.37 kg/m  Well-developed well-nourished male no acute distress vital signs stable he is afebrile no exam done today  1.  Mitral valve disease status post repair.....Marland Kitchen continue follow-up by cardiology #2 history of leukemia..... Currently remission..... Followed up in oncology  3.  Diabetes at goal,,,,,,,,,, continue current medicine follow-up.

## 2018-01-08 ENCOUNTER — Ambulatory Visit: Payer: 59 | Admitting: Internal Medicine

## 2018-01-08 ENCOUNTER — Encounter: Payer: Self-pay | Admitting: Internal Medicine

## 2018-01-08 VITALS — BP 130/80 | HR 75 | Ht 68.0 in | Wt 178.0 lb

## 2018-01-08 DIAGNOSIS — E663 Overweight: Secondary | ICD-10-CM

## 2018-01-08 DIAGNOSIS — Z794 Long term (current) use of insulin: Secondary | ICD-10-CM | POA: Diagnosis not present

## 2018-01-08 DIAGNOSIS — E11649 Type 2 diabetes mellitus with hypoglycemia without coma: Secondary | ICD-10-CM

## 2018-01-08 DIAGNOSIS — E785 Hyperlipidemia, unspecified: Secondary | ICD-10-CM | POA: Diagnosis not present

## 2018-01-08 LAB — POCT GLYCOSYLATED HEMOGLOBIN (HGB A1C): Hemoglobin A1C: 6.7 % — AB (ref 4.0–5.6)

## 2018-01-08 NOTE — Progress Notes (Signed)
Patient ID: David Kelleher [name], male   DOB: September 07, 1945, 72 y.o.   MRN: [id number]   HPI: David Carr. is a 72 y.o.-year-old male, returning for f/u for DM2, dx at the end of the 1990s, insulin-dependent since ~2014, uncontrolled, without long term complications. Last visit 4 months ago.  Last hemoglobin A1c was: Lab Results  Component Value Date   HGBA1C 6.5 (A) 09/06/2017   HGBA1C 7.2 06/05/2017   HGBA1C 6.9 03/02/2017   Pt was on a regimen of: - Humalog 75/25 35 >> increased to 45 units at bedtime! - Metformin 500 mg 2x a day, with meals >> now 500 mg at dinnertime  Now on: - Metformin ER [phone] mg with dinner (dizziness and shortness of breath with higher doses) - Basaglar 14 units at bedtime He stopped Jardiance 10 mg b/c increased urination and dehydration (felt like being drunk).  Pt checks his sugars up to 3 times a day: - am:106-130, 154 >> 100-141 >> 115 >> 88-120, 131, 148 - 2h after b'fast:138-171 >> n/c >> 107-148 >> 126, 128 - before lunch:134-144 >> 128-143 >> 107-136 >> 96-116, 139 - 2h after lunch:130, 173, 205 >> 150-195 >> 152 >> 193 - before dinner: 96-146 >> 108-151 >> 78-115 >> 76, 86-140, 151 - 2h after dinner:119-206 >> 139-175, 182, 188 >> 99-190 - bedtime: n/c >> 134-204 >> 125 >> n/c >> 144-168, 223 - nighttime: n/c Lowest sugar was: 76. He has hypoglycemia awareness in the 80s. He had an episode of loss of consciousness when sugars reached 60 in 07/17/2016.  Highest sugar was 336 (trailmix) >> 188 >> 223.  Glucometer: Freestyle Lite  Pt's meals are: - Breakfast: coffee + nab or slice pound cake + pop tarts - Lunch: fast food or sandwich or skips - Dinner: meat + veggies  - Snacks: 1, before bedtime  He only has occasional regular sodas now  He continues to walk twice a day, 1.5 miles, with his black lab.  -No CKD, last BUN/creatinine:  Lab Results  Component Value Date   BUN 14 08/30/2017   BUN 15 06/18/2017   CREATININE 1.00  08/30/2017   CREATININE 1.00 06/18/2017   -+ HL; last set of lipids: Lab Results  Component Value Date   CHOL 143 06/18/2017   HDL 51.50 06/18/2017   LDLCALC 72 06/18/2017   LDLDIRECT 74.4 06/19/2006   TRIG 97.0 06/18/2017   CHOLHDL 3 06/18/2017  On Zocor. - last eye exam was in 11/2017: No DR - no numbness and tingling in his feet.  He has AML in remission.  Now off Coumadin.  ROS: Constitutional: no weight gain/+ weight loss, no fatigue, no subjective hyperthermia, no subjective hypothermia Eyes: no blurry vision, no xerophthalmia ENT: no sore throat, no nodules palpated in neck, no dysphagia, no odynophagia, no hoarseness Cardiovascular: no CP/no SOB/no palpitations/no leg swelling Respiratory: no cough/no SOB/no wheezing Gastrointestinal: no N/no V/no D/no C/no acid reflux Musculoskeletal: no muscle aches/no joint aches Skin: no rashes, no hair loss Neurological: no tremors/no numbness/no tingling/no dizziness  I reviewed pt's medications, allergies, PMH, social hx, family hx, and changes were documented in the history of present illness. Otherwise, unchanged from my initial visit note.   Past Medical History:  Diagnosis Date  . AML (acute myeloid leukemia) in remission [facility]) oncologist-  dr Alvy Bimler-- per last note 10/ 2017 in remission 2 years   dx 09-11-2013  via bone marrow bx , FLT3 negative (NP M1 +)/  chemotherapy started 09-17-2013,  remission via marrow bx 11-06-2013,  4 cycles consolidation chemo w/ HiDAC 11-13-2013 to 03-05-2014  . BPH with urinary obstruction   . Chronic thrombocytopenic purpura ([place])   . Dental caries    periodontitis  . GERD (gastroesophageal reflux disease)   . History of kidney stones   . Hyperlipidemia   . Hypertension   . MVP (mitral valve prolapse)   . Pancytopenia, acquired ([place])   . 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 Severe mitral regurgitation   . Type 2 diabetes mellitus with hypoglycemia, with long-term current use of insulin ([facility]    endocrinologist-  dr gCon Memos . Wears denture    upper   Past Surgical History:  Procedure Laterality Date  . CARDIAC CATHETERIZATION    . CARDIOVASCULAR STRESS TEST  07/23/2013   Low risk nuclear study w/ mild inferior ischemia/  normal LV function and wall motion, ef 69%  . CYSTOSCOPY WITH RETROGRADE PYELOGRAM, URETEROSCOPY AND STENT PLACEMENT Right 04/20/2016   Procedure: CYSTOSCOPY WITH RETROGRADE PYELOGRAM, URETEROSCOPY , STONE BASKETRY AND STENT PLACEMENT;  Surgeon: SFranchot Gallo MD;  [facility]  Service: Urology;  Laterality: Right;  . EXTRACORPOREAL SHOCK WAVE LITHOTRIPSY  yrs ago  . HOLMIUM LASER APPLICATION Right 39/02/6943  Procedure: HOLMIUM LASER APPLICATION;  Surgeon: SFranchot Gallo MD;  [facility]  Service: Urology;  Laterality: Right;  . INGUINAL HERNIA REPAIR Left 1990's  . MITRAL VALVE REPAIR N/A 06/21/2016   Procedure: MINIMALLY INVASIVE MITRAL VALVE REPAIR (MVR) USING SORIN MEMO 3D SIZE 32 SEMIRIGID ANNULOPLASTY RING;  Surgeon: ORexene Alberts MD;  [place] [facility]  Service: Open Heart Surgery;  Laterality: N/A;  . MULTIPLE EXTRACTIONS WITH ALVEOLOPLASTY N/A 06/08/2016   Procedure: MULTIPLE EXTRACTION WITH ALVEOLOPLASTY AND PRE-PROSTHETIC SURGERY AS NEEDED;  Surgeon: RLenn Cal DDS;  [place] [facility]  Service: Oral Surgery;  Laterality: N/A;  . PATENT FORAMEN OVALE(PFO) CLOSURE N/A 06/21/2016   Procedure: PATENT FORAMEN OVALE (PFO) CLOSURE;  Surgeon: ORexene Alberts MD;  [place] [facility]  Service: Open Heart Surgery;  Laterality: N/A;  . RIGHT/LEFT HEART CATH AND CORONARY ANGIOGRAPHY N/A 06/07/2016   Procedure: Right/Left Heart Cath and Coronary Angiography;   Surgeon: MSherren Mocha MD;  [facility];  Service: Cardiovascular;  Laterality: N/A;  . ROTATOR CUFF REPAIR Right [id number] . TEE WITHOUT CARDIOVERSION N/A 01/11/2016   Procedure: TRANSESOPHAGEAL ECHOCARDIOGRAM (TEE);  Surgeon: KPixie Casino MD;  [facility];  Service: Cardiovascular;  Laterality: N/A; Severe MR associated w/ flail P2 segment of MV;  no LAA thromus;  small PFO by saline microbubble contrast after valsalva; LVEF 55-60%  . TEE WITHOUT CARDIOVERSION N/A 06/21/2016   Procedure: TRANSESOPHAGEAL ECHOCARDIOGRAM (TEE);  Surgeon: ORexene Alberts MD;  [place] [facility]  Service: Open Heart Surgery;  Laterality: N/A;  . TENNIS ELBOW RELEASE/NIRSCHEL PROCEDURE Right 1990's  . TRANSTHORACIC ECHOCARDIOGRAM  12/22/2015   grade 1 diastolic dysfunction,  ef 55-60%/  moderate MVP involving posterior leaflet w/ partial flail and severe MR via CW doppler (peak gradient 367mg)/  trivial TR   Social History   Social History  . Marital status: Married    Spouse name: N/A  . Number of children: 2   Occupational History  . accountant   Social History  Main Topics  . Smoking status: Former Research scientist (life sciences)  . Smokeless tobacco: Never Used  . Alcohol use No  . Drug use: No   Current Outpatient Medications on File Prior to Visit  Medication Sig Dispense Refill  . acetaminophen (TYLENOL) 500 MG tablet Take 2 tablets (1,000 mg total) by mouth every 6 (six) hours as needed. 30 tablet 0  . aspirin 81 MG tablet Take 81 mg by mouth as needed.     Marland Kitchen atenolol (TENORMIN) 25 MG tablet Take 0.5 tablets (12.5 mg total) by mouth daily. 100 tablet 4  . glucose blood (FREESTYLE LITE) test strip USE AS INSTRUCTED TO CHECK SUGAR 2 TIMES DAILY 300 each 3  . Insulin Glargine (BASAGLAR KWIKPEN) 100 UNIT/ML SOPN Inject 0.14 mLs (14 Units total) into the skin at bedtime. 10 pen 3  . Insulin Pen Needle (BD PEN NEEDLE NANO U/F) 32G X 4 MM MISC USE 1X A DAY 100 each 3  . JARDIANCE 10 MG TABS  tablet TAKE 1 TABLET BY MOUTH EVERY DAY (Patient not taking: Reported on 12/17/2017) 90 tablet 1  . metFORMIN (GLUCOPHAGE) 500 MG tablet Take 500 mg by mouth daily.    . simvastatin (ZOCOR) 20 MG tablet Take 1 tablet (20 mg total) by mouth at bedtime. 100 tablet 4   Current Facility-Administered Medications on File Prior to Visit  Medication Dose Route Frequency Provider Last Rate Last Dose  . heparin lock flush 100 unit/mL  500 Units Intravenous Once Brunetta Genera, MD      . sodium chloride 0.9 % injection 10 mL  10 mL Intravenous PRN Brunetta Genera, MD        Allergies  Allergen Reactions  . Prochlorperazine Other (See Comments)    ARRHYTHMIAS  . Morphine And Related     Shuts bladder down.   Family History  Problem Relation Age of Onset  . Heart attack Father   . Alcoholism Father   . Obesity Father   . Asthma Sister   . Cancer Daughter        carcinoid tumor   PE: BP 130/80   Pulse 75   Ht _0  (1.727 m)   Wt 178 lb (80.7 kg)   SpO2 97%   BMI 27.06 kg/m  Wt Readings from Last 3 Encounters:  01/08/18 178 lb (80.7 kg)  12/17/17 180 lb (81.6 kg)  09/06/17 183 lb (83 kg)   Constitutional: overweight, in NAD Eyes: PERRLA, EOMI, no exophthalmos ENT: moist mucous membranes, no thyromegaly, no cervical lymphadenopathy Cardiovascular: RRR, No MRG Respiratory: CTA B Gastrointestinal: abdomen soft, NT, ND, BS+ Musculoskeletal: no deformities, strength intact in all 4 Skin: moist, warm, no rashes Neurological: no tremor with outstretched hands, DTR normal in all 4  ASSESSMENT: 1. DM2, insulin-dependent, uncontrolled, without Long term complications, but with hypoglycemia  2. HL  3.  Overweight  PLAN:  1. Patient with long-standing, previously uncontrolled, type 2 diabetes, currently on oral medications and long-acting insulin.  She had dizziness with metformin so he is alternating 500 with 1000 mg with dinner, depending on his symptoms.  He had a UTI from  [facility] since last visit.  This resolved.  At last visit, sugars were improved at all times of the day but he was feeling hypoglycemic in the morning so we decreased the dose of Basaglar slightly.  I advised him to also check sugars whenever he feels hypoglycemic, as he was not doing so.  At that time, HbA1c was better, at 6.5%. -  At this visit, sugars are still at goal most of the day, with occasional high blood sugars after he eats out at night.  It does not appear that stopping Jardiance worsened the sugars significantly, so we do not need to replace this with another medication.  I advised him to continue the current dose of Basaglar since he does not have any more lows.  We will also continue metformin low dose. - I suggested to:  Patient Instructions  Please continue: - Metformin ER [phone] mg with dinner  - Basaglar 14 units at bedtime  Please return in 4 months with your sugar log.   - today, HbA1c is 6.7% (a little higher) - continue checking sugars at different times of the day - check 1x a day, rotating checks - advised for yearly eye exams >> he is UTD - UTD with flu shot - Return to clinic in 4 mo with sugar log      2. HL - Reviewed latest lipid panel: All fractions at goal Lab Results  Component Value Date   CHOL 143 06/18/2017   HDL 51.50 06/18/2017   LDLCALC 72 06/18/2017   LDLDIRECT 74.4 06/19/2006   TRIG 97.0 06/18/2017   CHOLHDL 3 06/18/2017  - Continues Zocor without side effects.  3.  Overweight - He lost approximately 10 to 12 pounds before last visit.  Target weight 170's. - lost another 5 lbs since last OV! - unfortunately we had to stop Jardiance 2/2 SEs, but he is doing well even without it.   Philemon Kingdom, MD PhD [facility]

## 2018-01-08 NOTE — Patient Instructions (Signed)
Please continue: - Metformin ER [phone] mg with dinner  - Basaglar 14 units at bedtime  Please return in 4 months with your sugar log.

## 2018-01-17 ENCOUNTER — Encounter: Payer: Self-pay | Admitting: Internal Medicine

## 2018-01-28 ENCOUNTER — Other Ambulatory Visit: Payer: Self-pay | Admitting: Internal Medicine

## 2018-01-29 ENCOUNTER — Other Ambulatory Visit: Payer: Self-pay | Admitting: *Deleted

## 2018-01-29 DIAGNOSIS — E7800 Pure hypercholesterolemia, unspecified: Secondary | ICD-10-CM

## 2018-01-29 MED ORDER — ATENOLOL 25 MG PO TABS: 12.5000 mg | ORAL_TABLET | Freq: Every day | ORAL | 1 refills | Status: DC

## 2018-01-29 MED ORDER — SIMVASTATIN 20 MG PO TABS: 20.0000 mg | ORAL_TABLET | Freq: Every day | ORAL | 1 refills | Status: DC

## 2018-02-14 ENCOUNTER — Encounter: Payer: Self-pay | Admitting: Family Medicine

## 2018-02-14 LAB — HM DIABETES EYE EXAM

## 2018-03-12 ENCOUNTER — Other Ambulatory Visit: Payer: Self-pay | Admitting: Internal Medicine

## 2018-03-27 ENCOUNTER — Encounter: Payer: Self-pay | Admitting: Cardiology

## 2018-04-10 NOTE — Progress Notes (Signed)
HPI The patient presents for followup of MR.  He is status post minimally invasive MV repair with ring annuloplasty and placement of Neo Chord on 06/30/16.  At the last visit he was to have an echocardiogram.  He did not have this.  He feels well.  The patient denies any new symptoms such as chest discomfort, neck or arm discomfort. There has been no new shortness of breath, PND or orthopnea. There have been no reported palpitations, presyncope or syncope.    Allergies  Allergen Reactions  . Prochlorperazine Other (See Comments)    ARRHYTHMIAS  . Morphine And Related     Shuts bladder down.    Current Outpatient Medications  Medication Sig Dispense Refill  . acetaminophen (TYLENOL) 500 MG tablet Take 2 tablets (1,000 mg total) by mouth every 6 (six) hours as needed. 30 tablet 0  . aspirin 81 MG tablet Take 81 mg by mouth as needed.     Marland Kitchen atenolol (TENORMIN) 25 MG tablet Take 0.5 tablets (12.5 mg total) by mouth daily. 90 tablet 1  . glucose blood (FREESTYLE LITE) test strip USE AS INSTRUCTED TO CHECK SUGAR 2 TIMES DAILY 300 each 3  . Insulin Glargine (BASAGLAR KWIKPEN) 100 UNIT/ML SOPN INJECT 16 UNITS INTO THE SKIN AT BEDTIME 15 pen 7  . Insulin Pen Needle (BD PEN NEEDLE NANO U/F) 32G X 4 MM MISC USE 1X A DAY 100 each 3  . metFORMIN (GLUCOPHAGE-XR) 500 MG 24 hr tablet TAKE 1 TABLET BY MOUTH DAILY WITH SUPPER 90 tablet 1  . simvastatin (ZOCOR) 20 MG tablet Take 1 tablet (20 mg total) by mouth at bedtime. 90 tablet 1   No current facility-administered medications for this visit.    Facility-Administered Medications Ordered in Other Visits  Medication Dose Route Frequency Provider Last Rate Last Dose  . heparin lock flush 100 unit/mL  500 Units Intravenous Once Brunetta Genera, MD      . sodium chloride 0.9 % injection 10 mL  10 mL Intravenous PRN Brunetta Genera, MD        Past Medical History:  Diagnosis Date  . AML (acute myeloid leukemia) in remission [facility])  oncologist-  dr Alvy Bimler-- per last note 10/ 2017 in remission 2 years   dx 09-11-2013  via bone marrow bx , FLT3 negative (NP M1 +)/  chemotherapy started 09-17-2013,  remission via marrow bx 11-06-2013,  4 cycles consolidation chemo w/ HiDAC 11-13-2013 to 03-05-2014  . BPH with urinary obstruction   . Chronic thrombocytopenic purpura ([place])   . Dental caries    periodontitis  . GERD (gastroesophageal reflux disease)   . History of kidney stones   . Hyperlipidemia   . Hypertension   . MVP (mitral valve prolapse)   . Pancytopenia, acquired ([place])   . 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   Complex valvuloplasty including triangular resection of flail segment of posterior leaflet, artificial Gore-tex neochord placement x6 and 29m Sorin Memo 3D ring annuloplasty via right mini thoracotomy approach  . Severe mitral regurgitation   . Type 2 diabetes mellitus with hypoglycemia, with long-term current use of insulin ([facility]    endocrinologist-  dr gCon Memos . Wears denture    upper    Past Surgical History:  Procedure Laterality Date  . CARDIAC CATHETERIZATION    . CARDIOVASCULAR STRESS TEST  07/23/2013   Low risk nuclear study w/ mild inferior ischemia/  normal LV function and wall motion, ef 69%  . CYSTOSCOPY  WITH RETROGRADE PYELOGRAM, URETEROSCOPY AND STENT PLACEMENT Right 04/20/2016   Procedure: CYSTOSCOPY WITH RETROGRADE PYELOGRAM, URETEROSCOPY , STONE BASKETRY AND STENT PLACEMENT;  Surgeon: Franchot Gallo, MD;  [facility];  Service: Urology;  Laterality: Right;  . EXTRACORPOREAL SHOCK WAVE LITHOTRIPSY  yrs ago  . HOLMIUM LASER APPLICATION Right 08/16/4191   Procedure: HOLMIUM LASER APPLICATION;  Surgeon: Franchot Gallo, MD;  [facility];  Service: Urology;  Laterality: Right;  . INGUINAL HERNIA REPAIR Left 1990's  . MITRAL VALVE REPAIR N/A 06/21/2016   Procedure: MINIMALLY INVASIVE MITRAL VALVE REPAIR (MVR) USING SORIN MEMO  3D SIZE 32 SEMIRIGID ANNULOPLASTY RING;  Surgeon: Rexene Alberts, MD;  Location: [place];  Service: Open Heart Surgery;  Laterality: N/A;  . MULTIPLE EXTRACTIONS WITH ALVEOLOPLASTY N/A 06/08/2016   Procedure: MULTIPLE EXTRACTION WITH ALVEOLOPLASTY AND PRE-PROSTHETIC SURGERY AS NEEDED;  Surgeon: Lenn Cal, DDS;  Location: [place];  Service: Oral Surgery;  Laterality: N/A;  . PATENT FORAMEN OVALE(PFO) CLOSURE N/A 06/21/2016   Procedure: PATENT FORAMEN OVALE (PFO) CLOSURE;  Surgeon: Rexene Alberts, MD;  Location: [place];  Service: Open Heart Surgery;  Laterality: N/A;  . RIGHT/LEFT HEART CATH AND CORONARY ANGIOGRAPHY N/A 06/07/2016   Procedure: Right/Left Heart Cath and Coronary Angiography;  Surgeon: Sherren Mocha, MD;  Location: [place] CV LAB;  Service: Cardiovascular;  Laterality: N/A;  . ROTATOR CUFF REPAIR Right [id number]  . TEE WITHOUT CARDIOVERSION N/A 01/11/2016   Procedure: TRANSESOPHAGEAL ECHOCARDIOGRAM (TEE);  Surgeon: Pixie Casino, MD;  Location: [facility];  Service: Cardiovascular;  Laterality: N/A; Severe MR associated w/ flail P2 segment of MV;  no LAA thromus;  small PFO by saline microbubble contrast after valsalva; LVEF 55-60%  . TEE WITHOUT CARDIOVERSION N/A 06/21/2016   Procedure: TRANSESOPHAGEAL ECHOCARDIOGRAM (TEE);  Surgeon: Rexene Alberts, MD;  Location: [place];  Service: Open Heart Surgery;  Laterality: N/A;  . TENNIS ELBOW RELEASE/NIRSCHEL PROCEDURE Right 1990's  . TRANSTHORACIC ECHOCARDIOGRAM  12/22/2015   grade 1 diastolic dysfunction,  ef 55-60%/  moderate MVP involving posterior leaflet w/ partial flail and severe MR via CW doppler (peak gradient 44mHg)/  trivial TR    ROS: As stated in the HPI and negative for all other systems.   PHYSICAL EXAM BP 92/64   Pulse 82   Ht '5\' 8"'$  (1.727 m)   Wt 179 lb 6.4 oz (81.4 kg)   BMI 27.28 kg/m   GENERAL:  Well appearing NECK:  No jugular venous distention, waveform within normal limits, carotid upstroke brisk  and symmetric, no bruits, no thyromegaly LUNGS:  Clear to auscultation bilaterally CHEST:  Well healed thoracotomy scar. HEART:  PMI not displaced or sustained,S1 and S2 within normal limits, no S3, no S4, no clicks, no rubs, no murmurs ABD:  Flat, positive bowel sounds normal in frequency in pitch, no bruits, no rebound, no guarding, no midline pulsatile mass, no hepatomegaly, no splenomegaly, umbilical hernia EXT:  2 plus pulses throughout, no edema, no cyanosis no clubbing   EKG: Sinus rhythm, rate 82, axis within normal limits, intervals within normal limits, premature atrial contractions.  Lab Results  Component Value Date   CHOL 143 06/18/2017   HDL 51.50 06/18/2017   LDLCALC 72 06/18/2017   LDLDIRECT 74.4 06/19/2006   TRIG 97.0 06/18/2017   CHOLHDL 3 06/18/2017   Lab Results  Component Value Date   HGBA1C 6.7 (A) 01/08/2018     ASSESSMENT AND PLAN    MITRAL REGURGITATION -  He is status post repair.  He had a stable repair on echo in 2018  I will follow with an echo.  No change in therapy.  He is aware of SBE prophylaxis.   HYPERTENSION - The blood pressure is at target. No change in therapy.   DYSLIPIDEMIA - LDL was was at target as above.  No change in therapy   DM - A1C was 6.7.

## 2018-04-11 ENCOUNTER — Ambulatory Visit: Payer: 59 | Admitting: Cardiology

## 2018-04-11 ENCOUNTER — Encounter: Payer: Self-pay | Admitting: Cardiology

## 2018-04-11 VITALS — BP 92/64 | HR 82 | Ht 68.0 in | Wt 179.4 lb

## 2018-04-11 DIAGNOSIS — I10 Essential (primary) hypertension: Secondary | ICD-10-CM | POA: Diagnosis not present

## 2018-04-11 DIAGNOSIS — E785 Hyperlipidemia, unspecified: Secondary | ICD-10-CM

## 2018-04-11 DIAGNOSIS — Z9889 Other specified postprocedural states: Secondary | ICD-10-CM | POA: Diagnosis not present

## 2018-04-11 NOTE — Patient Instructions (Signed)
Medication Instructions:  Continue current medications  If you need a refill on your cardiac medications before your next appointment, please call your pharmacy.  Labwork: None Ordered   Testing/Procedures: Your physician has requested that you have an echocardiogram. Echocardiography is a painless test that uses sound waves to create images of your heart. It provides your doctor with information about the size and shape of your heart and how well your heart's chambers and valves are working. This procedure takes approximately one hour. There are no restrictions for this procedure.   Follow-Up: You will need a follow up appointment in 1 Year.  Please call our office 2 months in advance to schedule this appointment.  You may see Dr Percival Spanish or one of the following Advanced Practice Providers on your designated Care Team:   Rosaria Ferries, PA-C . Jory Sims, DNP, ANP   At [facility], you and your health needs are our priority.  As part of our continuing mission to provide you with exceptional heart care, we have created designated Provider Care Teams.  These Care Teams include your primary Cardiologist (physician) and Advanced Practice Providers (APPs -  Physician Assistants and Nurse Practitioners) who all work together to provide you with the care you need, when you need it.  Thank you for choosing [facility] at [facility]!!

## 2018-04-22 ENCOUNTER — Ambulatory Visit (HOSPITAL_COMMUNITY): Payer: 59 | Attending: Cardiology

## 2018-04-22 DIAGNOSIS — Z9889 Other specified postprocedural states: Secondary | ICD-10-CM | POA: Diagnosis present

## 2018-04-25 ENCOUNTER — Encounter: Payer: Self-pay | Admitting: *Deleted

## 2018-04-25 ENCOUNTER — Telehealth: Payer: Self-pay | Admitting: Cardiology

## 2018-04-25 NOTE — Telephone Encounter (Signed)
Patient returned call for Echo results.  ?

## 2018-04-25 NOTE — Telephone Encounter (Signed)
NO ANSWER; LMTCB 3/12

## 2018-04-29 NOTE — Telephone Encounter (Signed)
Call rejected. According to results on echo, letter with results mailed to the patient.

## 2018-05-09 ENCOUNTER — Other Ambulatory Visit: Payer: Self-pay

## 2018-05-09 ENCOUNTER — Ambulatory Visit: Payer: 59 | Admitting: Internal Medicine

## 2018-05-09 ENCOUNTER — Encounter: Payer: Self-pay | Admitting: Internal Medicine

## 2018-05-09 VITALS — BP 118/68 | HR 90 | Temp 98.5°F | Ht 68.0 in | Wt 180.0 lb

## 2018-05-09 DIAGNOSIS — E11649 Type 2 diabetes mellitus with hypoglycemia without coma: Secondary | ICD-10-CM | POA: Diagnosis not present

## 2018-05-09 DIAGNOSIS — Z794 Long term (current) use of insulin: Secondary | ICD-10-CM | POA: Diagnosis not present

## 2018-05-09 DIAGNOSIS — E663 Overweight: Secondary | ICD-10-CM

## 2018-05-09 DIAGNOSIS — E785 Hyperlipidemia, unspecified: Secondary | ICD-10-CM | POA: Diagnosis not present

## 2018-05-09 LAB — POCT GLYCOSYLATED HEMOGLOBIN (HGB A1C): Hemoglobin A1C: 6.8 % — AB (ref 4.0–5.6)

## 2018-05-09 MED ORDER — METFORMIN HCL ER 500 MG PO TB24: ORAL_TABLET | ORAL | 3 refills | Status: DC

## 2018-05-09 MED ORDER — ACCU-CHEK MULTICLIX LANCETS MISC: 12 refills | Status: DC

## 2018-05-09 MED ORDER — GLUCOSE BLOOD VI STRP: ORAL_STRIP | 12 refills | Status: DC

## 2018-05-09 MED ORDER — INSULIN PEN NEEDLE 32G X 4 MM MISC: 3 refills | Status: DC

## 2018-05-09 NOTE — Progress Notes (Signed)
Patient ID: David Carr., male   DOB: 1945/07/13, 73 y.o.   MRN: [id number]   HPI: David Carr. is a 73 y.o.-year-old male, returning for f/u for DM2, dx at the end of the 1990s, insulin-dependent since ~2014, uncontrolled, without long term complications. Last visit 4 months ago.  Last hemoglobin A1c was: Lab Results  Component Value Date   HGBA1C 6.7 (A) 01/08/2018   HGBA1C 6.5 (A) 09/06/2017   HGBA1C 7.2 06/05/2017   He is on: - Metformin ER [phone] mg with dinner (dizziness and SOB with higher doses) - Basaglar 14 units at bedtime He stopped Jardiance 10 mg b/c increased urination and dehydration (felt like being drunk). He was on 45 units of Humalog 75/25 at bedtime in the past.  Pt checks his sugars 2-3 times a day - forgot log: - am:  115 >> 88-120, 131, 148 >> 90s-115 - 2h after b'fast: 107-148 >> 126, 128 >> n/c  - before lunch: 107-136 >> 96-116, 139 >> 115-130 - 2h after lunch: 150-195 >> 152 >> 193 >> n/c - before dinner: 78-115 >> 76, 86-140, 151 >> 140-220 (snack, sweets) - 2h after dinner: 139-175, 182, 188 >> 99-190 >> 170-180, occasionally >200 - bedtime: 125 >> n/c >> 144-168, 223 >> see above - nighttime: n/c Lowest sugar was: 76 >> 70s x2 He has hypoglycemia awareness in the 80s. He had an episode of loss of consciousness when sugars reached 60 in 07/17/2016.  Highest sugar was 336 (trailmix) >> 188 >> 223 >> 200s. .  Glucometer: Freestyle Lite  Pt's meals are: - Breakfast: coffee + nab or slice pound cake + pop tarts - Lunch: fast food or sandwich or skips - Dinner: meat + veggies  - Snacks: 1, before bedtime  No regular sodas.  He continues to walk 2x a day, 1.5 miles, with his black lab.  - No CKD, last BUN/creatinine:  Lab Results  Component Value Date   BUN 14 08/30/2017   BUN 15 06/18/2017   CREATININE 1.00 08/30/2017   CREATININE 1.00 06/18/2017   - + HL; last set of lipids: Lab Results  Component Value Date   CHOL 143  06/18/2017   HDL 51.50 06/18/2017   LDLCALC 72 06/18/2017   LDLDIRECT 74.4 06/19/2006   TRIG 97.0 06/18/2017   CHOLHDL 3 06/18/2017  On Zocor. - last eye exam was in 11/2017: No DR - no numbness and tingling in his feet.  He has AML in remission.  Now off Coumadin.  ROS: Constitutional: no weight gain/no weight loss, no fatigue, no subjective hyperthermia, no subjective hypothermia Eyes: no blurry vision, no xerophthalmia ENT: no sore throat, no nodules palpated in neck, no dysphagia, no odynophagia, no hoarseness Cardiovascular: no CP/no SOB/no palpitations/no leg swelling Respiratory: no cough/no SOB/no wheezing Gastrointestinal: no N/no V/no D/no C/no acid reflux Musculoskeletal: no muscle aches/no joint aches Skin: no rashes, no hair loss Neurological: no tremors/no numbness/no tingling/no dizziness  I reviewed pt's medications, allergies, PMH, social hx, family hx, and changes were documented in the history of present illness. Otherwise, unchanged from my initial visit note.  Past Medical History:  Diagnosis Date  . AML (acute myeloid leukemia) in remission [facility]) oncologist-  dr Alvy Bimler-- per last note 10/ 2017 in remission 2 years   dx 09-11-2013  via bone marrow bx , FLT3 negative (NP M1 +)/  chemotherapy started 09-17-2013,  remission via marrow bx 11-06-2013,  4 cycles consolidation chemo w/ HiDAC 11-13-2013 to 03-05-2014  .  BPH with urinary obstruction   . Chronic thrombocytopenic purpura ([place])   . Dental caries    periodontitis  . GERD (gastroesophageal reflux disease)   . History of kidney stones   . Hyperlipidemia   . Hypertension   . MVP (mitral valve prolapse)   . Pancytopenia, acquired ([place])   . 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  Severe mitral regurgitation   . Type 2 diabetes mellitus with hypoglycemia, with long-term current use of insulin ([facility]    endocrinologist-  dr gCon Memos . Wears denture    upper   Past Surgical History:  Procedure Laterality Date  . CARDIAC CATHETERIZATION    . CARDIOVASCULAR STRESS TEST  07/23/2013   Low risk nuclear study w/ mild inferior ischemia/  normal LV function and wall motion, ef 69%  . CYSTOSCOPY WITH RETROGRADE PYELOGRAM, URETEROSCOPY AND STENT PLACEMENT Right 04/20/2016   Procedure: CYSTOSCOPY WITH RETROGRADE PYELOGRAM, URETEROSCOPY , STONE BASKETRY AND STENT PLACEMENT;  Surgeon: SFranchot Gallo MD;  [facility]  Service: Urology;  Laterality: Right;  . EXTRACORPOREAL SHOCK WAVE LITHOTRIPSY  yrs ago  . HOLMIUM LASER APPLICATION Right 37/04/5327  Procedure: HOLMIUM LASER APPLICATION;  Surgeon: SFranchot Gallo MD;  [facility]  Service: Urology;  Laterality: Right;  . INGUINAL HERNIA REPAIR Left 1990's  . MITRAL VALVE REPAIR N/A 06/21/2016   Procedure: MINIMALLY INVASIVE MITRAL VALVE REPAIR (MVR) USING SORIN MEMO 3D SIZE 32 SEMIRIGID ANNULOPLASTY RING;  Surgeon: ORexene Alberts MD;  [place]  Service: Open Heart Surgery;  Laterality: N/A;  . MULTIPLE EXTRACTIONS WITH ALVEOLOPLASTY N/A 06/08/2016   Procedure: MULTIPLE EXTRACTION WITH ALVEOLOPLASTY AND PRE-PROSTHETIC SURGERY AS NEEDED;  Surgeon: RLenn Cal DDS;  [place]  Service: Oral Surgery;  Laterality: N/A;  . PATENT FORAMEN OVALE(PFO) CLOSURE N/A 06/21/2016   Procedure: PATENT FORAMEN OVALE (PFO) CLOSURE;  Surgeon: ORexene Alberts MD;  [place]  Service: Open Heart Surgery;  Laterality: N/A;  . RIGHT/LEFT HEART CATH AND CORONARY ANGIOGRAPHY N/A 06/07/2016   Procedure: Right/Left Heart Cath and Coronary Angiography;  Surgeon: MSherren Mocha MD;  [place];  Service: Cardiovascular;  Laterality: N/A;  . ROTATOR CUFF REPAIR  Right [id number] . TEE WITHOUT CARDIOVERSION N/A 01/11/2016   Procedure: TRANSESOPHAGEAL ECHOCARDIOGRAM (TEE);  Surgeon: KPixie Casino MD;  [facility];  Service: Cardiovascular;  Laterality: N/A; Severe MR associated w/ flail P2 segment of MV;  no LAA thromus;  small PFO by saline microbubble contrast after valsalva; LVEF 55-60%  . TEE WITHOUT CARDIOVERSION N/A 06/21/2016   Procedure: TRANSESOPHAGEAL ECHOCARDIOGRAM (TEE);  Surgeon: ORexene Alberts MD;  [place]  Service: Open Heart Surgery;  Laterality: N/A;  . TENNIS ELBOW RELEASE/NIRSCHEL PROCEDURE Right 1990's  . TRANSTHORACIC ECHOCARDIOGRAM  12/22/2015   grade 1 diastolic dysfunction,  ef 55-60%/  moderate MVP involving posterior leaflet w/ partial flail and severe MR via CW doppler (peak gradient 336mg)/  trivial TR   Social History   Social History  . Marital status: Married    Spouse name: N/A  . Number of children: 2   Occupational History  . accountant   Social History Main Topics  . Smoking status: Former SmResearch scientist (life sciences). Smokeless tobacco: Never Used  . Alcohol  use No  . Drug use: No   Current Outpatient Medications on File Prior to Visit  Medication Sig Dispense Refill  . acetaminophen (TYLENOL) 500 MG tablet Take 2 tablets (1,000 mg total) by mouth every 6 (six) hours as needed. 30 tablet 0  . aspirin 81 MG tablet Take 81 mg by mouth as needed.     Marland Kitchen atenolol (TENORMIN) 25 MG tablet Take 0.5 tablets (12.5 mg total) by mouth daily. 90 tablet 1  . glucose blood (FREESTYLE LITE) test strip USE AS INSTRUCTED TO CHECK SUGAR 2 TIMES DAILY 300 each 3  . Insulin Glargine (BASAGLAR KWIKPEN) 100 UNIT/ML SOPN INJECT 16 UNITS INTO THE SKIN AT BEDTIME 15 pen 7  . Insulin Pen Needle (BD PEN NEEDLE NANO U/F) 32G X 4 MM MISC USE 1X A DAY 100 each 3  . metFORMIN (GLUCOPHAGE-XR) 500 MG 24 hr tablet TAKE 1 TABLET BY MOUTH DAILY WITH SUPPER 90 tablet 1  . simvastatin (ZOCOR) 20 MG tablet Take 1 tablet (20 mg total) by mouth  at bedtime. 90 tablet 1   Current Facility-Administered Medications on File Prior to Visit  Medication Dose Route Frequency Provider Last Rate Last Dose  . heparin lock flush 100 unit/mL  500 Units Intravenous Once Brunetta Genera, MD      . sodium chloride 0.9 % injection 10 mL  10 mL Intravenous PRN Brunetta Genera, MD        Allergies  Allergen Reactions  . Prochlorperazine Other (See Comments)    ARRHYTHMIAS  . Morphine And Related     Shuts bladder down.   Family History  Problem Relation Age of Onset  . Heart attack Father   . Alcoholism Father   . Obesity Father   . Asthma Sister   . Cancer Daughter        carcinoid tumor   PE: BP 118/68   Pulse 90   Temp 98.5 F (36.9 C)   Ht '5\' 8"'$  (1.727 m)   Wt 180 lb (81.6 kg)   SpO2 97%   BMI 27.37 kg/m  Wt Readings from Last 3 Encounters:  05/09/18 180 lb (81.6 kg)  04/11/18 179 lb 6.4 oz (81.4 kg)  01/08/18 178 lb (80.7 kg)   Constitutional: overweight, in NAD Eyes: PERRLA, EOMI, no exophthalmos ENT: moist mucous membranes, no thyromegaly, no cervical lymphadenopathy Cardiovascular: RRR, No MRG Respiratory: CTA B Gastrointestinal: abdomen soft, NT, ND, BS+ Musculoskeletal: no deformities, strength intact in all 4 Skin: moist, warm, no rashes Neurological: no tremor with outstretched hands, DTR normal in all 4  ASSESSMENT: 1. DM2, insulin-dependent, uncontrolled, without Long term complications, but with hypoglycemia  2. HL  3.  Overweight  PLAN:  1. Patient with longstanding, uncontrolled, type 2 diabetes, currently on metformin and long-acting insulin.  He has a history of dizziness with metformin with 1000 mg dose, so he is alternating between 500 with occasional 1000 mg with dinner, depending on his symptoms.  Unfortunately, we could not continue Jardiance since this caused increased urination and UTI.  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 -At last visit, sugars were at goal most  of the day with occasional high blood sugars after he was eating out at night.  We did not change the regimen then.  The HbA1c was a little higher, at 6.7%.  However, at this visit, sugars in the morning are at goal, but they increase significantly before dinner due to lunch being very close to dinner (  lunch at 1 PM, dinner at 4:30 PM).  I advised him to try to move the meals further apart but also to reduce snacking between the meals are reducing desserts after lunch.  I do not feel that he absolutely needs a change in regimen for now, but if his sugars later in the day are still high at next visit, he may need to add an additional medication. - I suggested to:  Patient Instructions  Please continue: - Metformin ER [phone] mg with dinner  - Basaglar 14 units at bedtime  Please return in 4 months with your sugar log.   - today, HbA1c is 6.8% (slightly higher) - continue checking sugars at different times of the day - check 1x a day, rotating checks - advised for yearly eye exams >> he is UTD - Return to clinic in 4 mo with sugar log       2. HL - Reviewed latest lipid panel from 06/2017: fractions at goal Lab Results  Component Value Date   CHOL 143 06/18/2017   HDL 51.50 06/18/2017   LDLCALC 72 06/18/2017   LDLDIRECT 74.4 06/19/2006   TRIG 97.0 06/18/2017   CHOLHDL 3 06/18/2017  - Continues Zocor without side effects.  3.  Overweight - lost 15-17 lbs in ~6 mo before last OV, and gained 2 pounds since last visit - unfortunaely, we had to stop Jardiance which was helping with wt loss, also - We discussed about improving diet   Philemon Kingdom, MD PhD [facility] Endocrinology

## 2018-05-09 NOTE — Patient Instructions (Addendum)
Please continue: - Metformin ER [phone] mg with dinner  - Basaglar 14 units at bedtime  Please return in 4 months with your sugar log.

## 2018-05-25 IMAGING — CT CT ANGIO CHEST
3 of 7 series · 18 of 36 positions shown · IV contrast (75CC ISOVUE 370)
Comparison: CT abdomen and pelvis 03/29/2016

CLINICAL DATA: Follow-up dissection. Mitral valve disorder, preop
testing.

EXAM:
CT ANGIOGRAPHY CHEST, ABDOMEN AND PELVIS
TECHNIQUE: Multidetector CT imaging through the chest, abdomen and pelvis was
performed using the standard protocol during bolus administration of
intravenous contrast. Multiplanar reconstructed images and MIPs were
obtained and reviewed to evaluate the vascular anatomy.
CONTRAST:  75 cc Isovue 370 IV

[Series 4: angio · axial · 0.94mm/px · z∈[-612,-70]mm · 9 of 273 slices shown]
[im 28/273  lung]
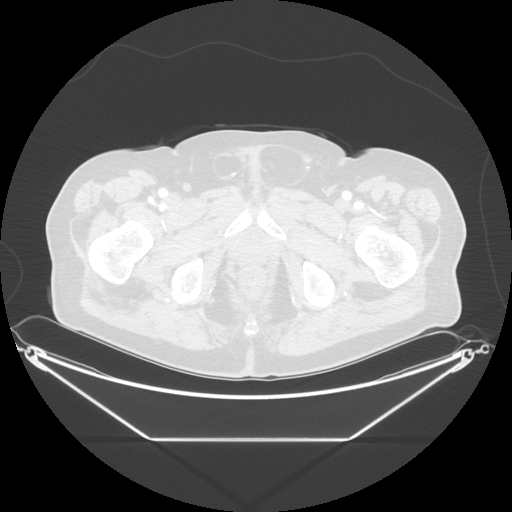
[im 55/273  mediastinal]
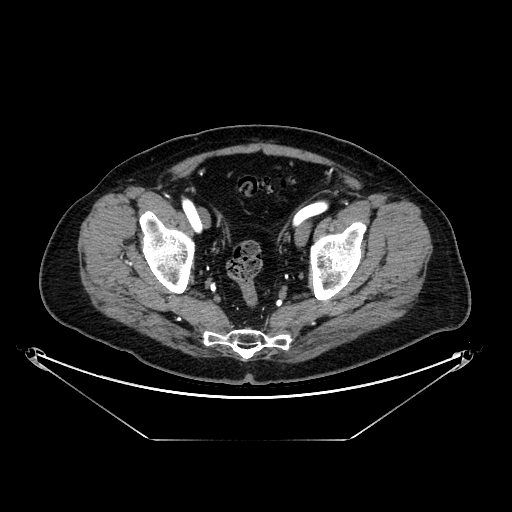
[im 82/273  lung]
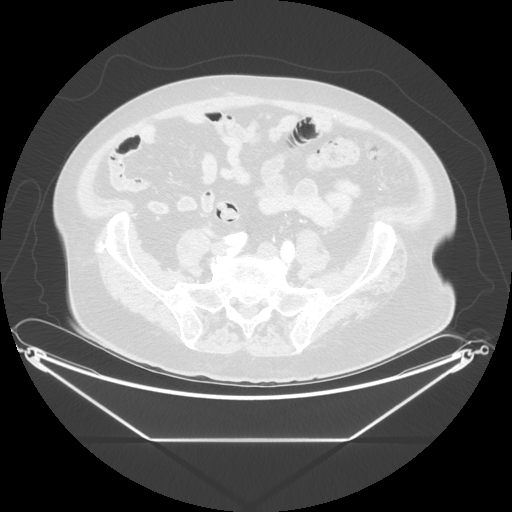
[im 109/273  mediastinal]
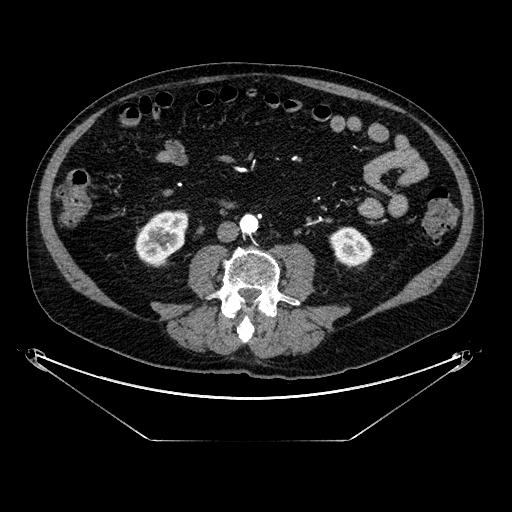
[im 137/273  lung]
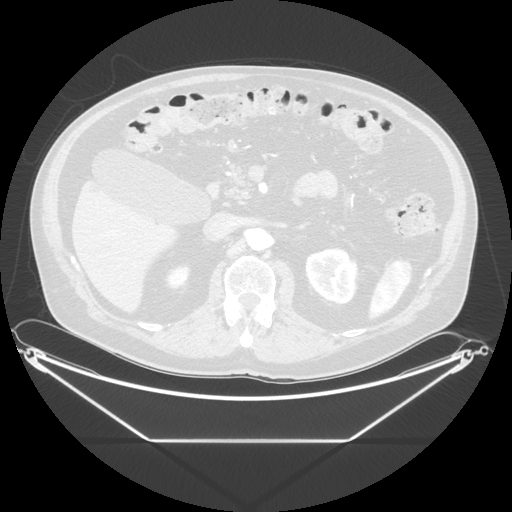
[im 164/273  mediastinal]
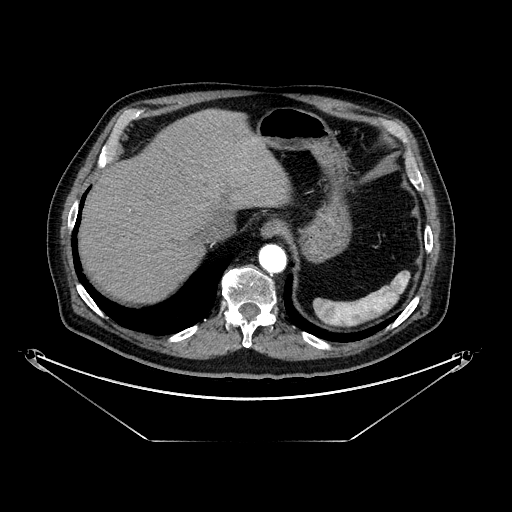
[im 191/273  lung]
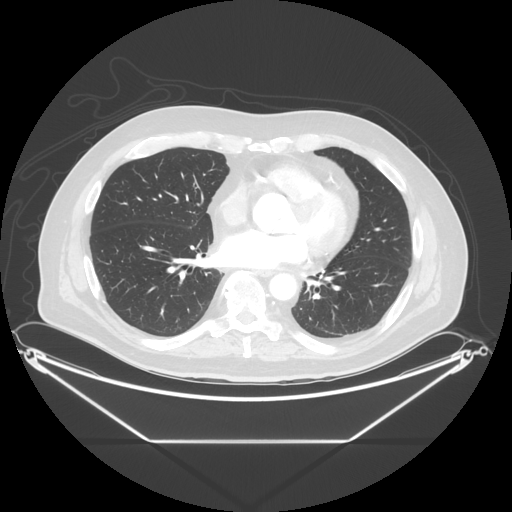
[im 218/273  mediastinal]
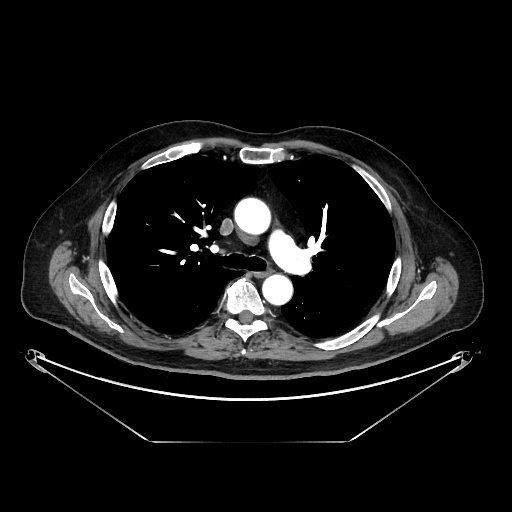
[im 245/273  lung]
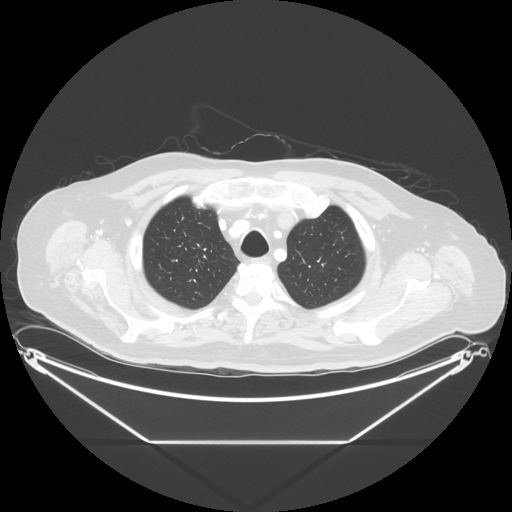

[Series 5: lung windows · axial · 0.88mm/px · z∈[-272,-138]mm · 3 of 137 slices shown]
[im 28/137  mediastinal]
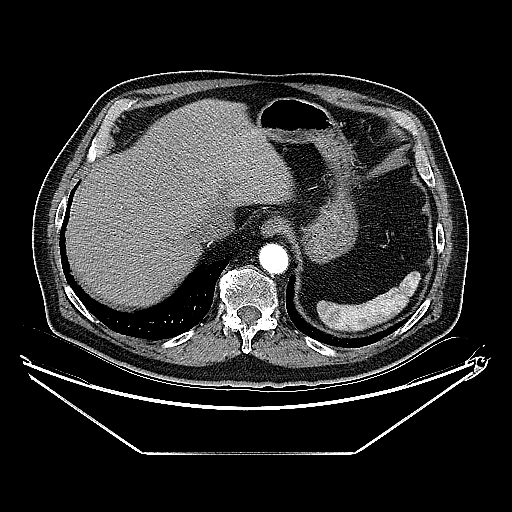
[im 55/137  mediastinal]
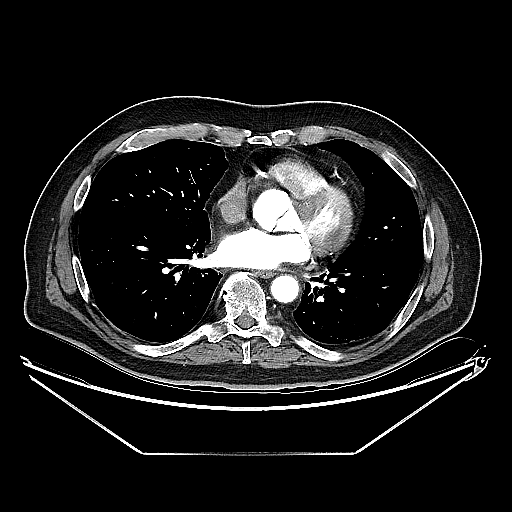
[im 82/137  mediastinal]
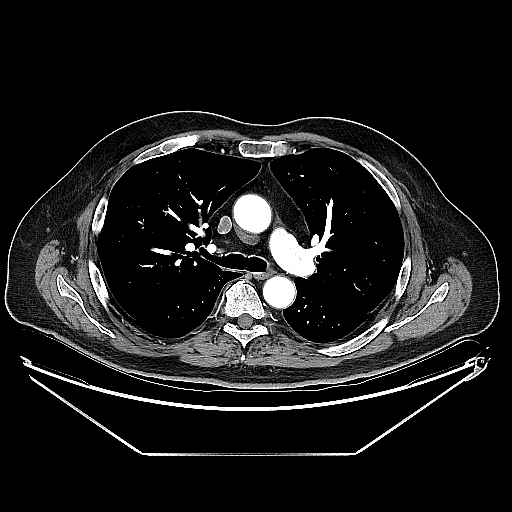

[Series 602: sagittal body · sagittal · 1.33mm/px · 6 of 190 slices shown]
[im 28/190  lung]
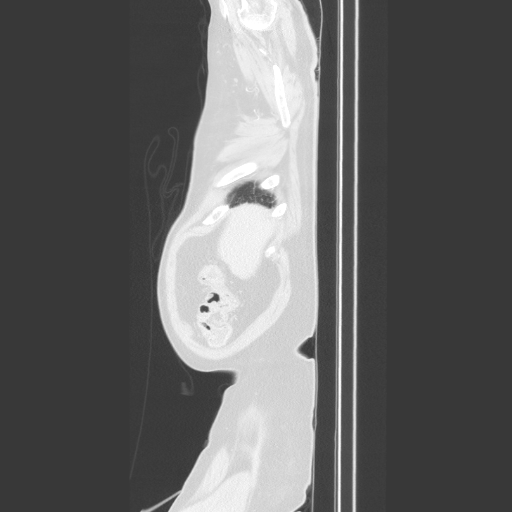
[im 55/190  lung]
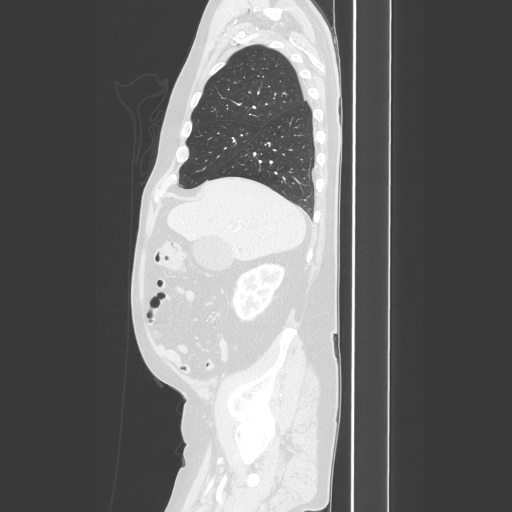
[im 82/190  lung]
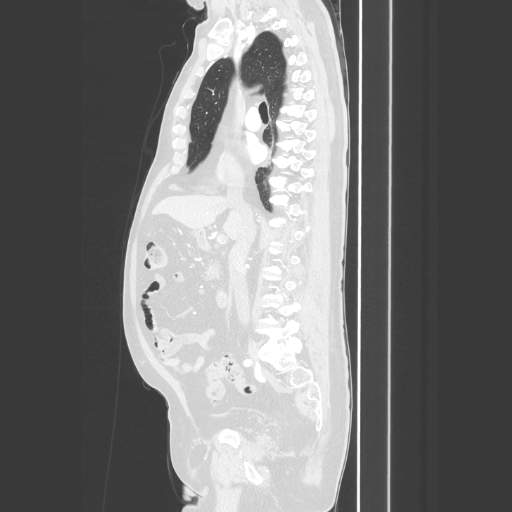
[im 109/190  lung]
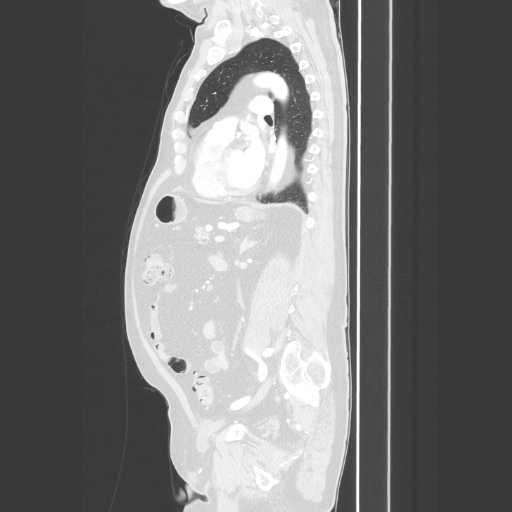
[im 136/190  lung]
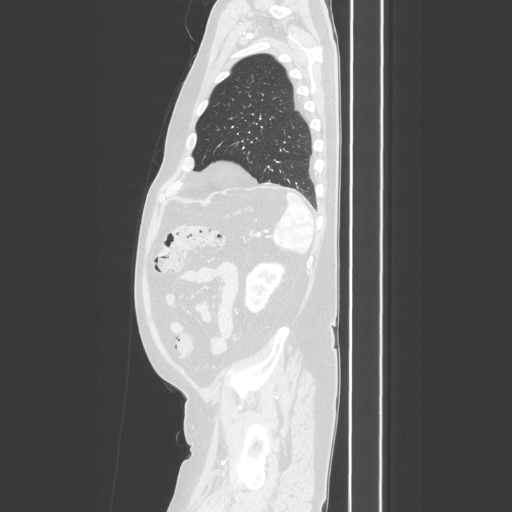
[im 163/190  lung]
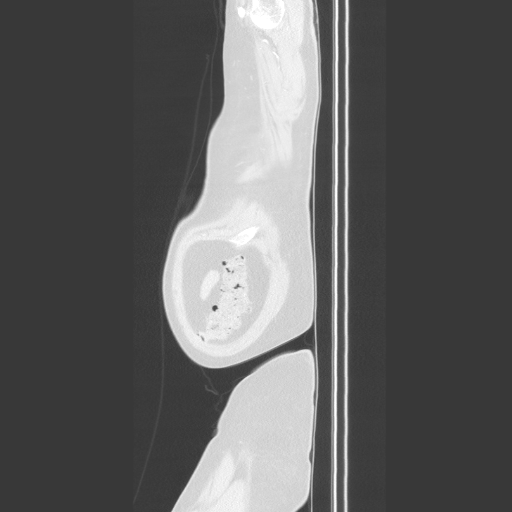

[18 of 36 positions shown; findings below may reference images not displayed]

FINDINGS: CTA CHEST FINDINGS

Cardiovascular: Heart is upper limits normal in size. Scattered
coronary artery calcifications in the left anterior descending and
right coronary arteries. Aorta is normal caliber. Maximum aortic
diameter in the ascending thoracic aorta 3.2 cm. No dissection. No
filling defects in the pulmonary arteries to suggest pulmonary
emboli.

Mediastinum/Nodes: No mediastinal, hilar, or axillary adenopathy.
Trachea and esophagus are unremarkable.

Lungs/Pleura: Lungs are clear. No focal airspace opacities or
suspicious nodules. No effusions.

Musculoskeletal: Chest wall soft tissues are unremarkable. No acute
bony abnormality.

Review of the MIP images confirms the above findings.

CTA ABDOMEN AND PELVIS FINDINGS

VASCULAR

Aorta: Normal caliber. No dissection or aneurysm. Mild
atherosclerotic calcifications in the infrarenal aorta.

Celiac: Widely patent

SMA: Widely patent

Renals: Widely patent, single bilaterally.

IMA: Widely patent

Inflow: Widely patent

Veins: Grossly unremarkable.

Review of the MIP images confirms the above findings.

NON-VASCULAR

Hepatobiliary: Small scattered hypodensities in the liver, likely
small cysts. Gallbladder unremarkable.

Pancreas: Mild fatty replacement. No focal abnormality or ductal
dilatation.

Spleen: No focal abnormality.  Normal size.

Adrenals/Urinary Tract: No adrenal abnormality. No focal renal
abnormality. No stones or hydronephrosis. Urinary bladder is
unremarkable. A portion of the anterior bladder wall is located
within a left inguinal hernia.

Stomach/Bowel: Scattered colonic diverticula. No active
diverticulitis. Appendix is normal. Stomach and small bowel
decompressed, unremarkable.

Lymphatic: No adenopathy.

Reproductive: No visible focal abnormality. Mild prostate
enlargement.

Other: No free fluid or free air. Bilateral inguinal hernias,
containing fat on the right and a portion of the anterior bladder
wall on the left.

Musculoskeletal: No acute bony abnormality.

Review of the MIP images confirms the above findings.
IMPRESSION: No evidence of aortic aneurysm or dissection.

No acute cardiopulmonary disease.

No acute findings in the abdomen or pelvis.

Coronary artery disease.

Bilateral inguinal hernias. The anterior bladder wall is located
within the left inguinal hernia.

## 2018-05-27 ENCOUNTER — Ambulatory Visit (INDEPENDENT_AMBULATORY_CARE_PROVIDER_SITE_OTHER): Payer: 59 | Admitting: Family Medicine

## 2018-05-27 ENCOUNTER — Encounter: Payer: Self-pay | Admitting: Family Medicine

## 2018-05-27 ENCOUNTER — Other Ambulatory Visit: Payer: Self-pay

## 2018-05-27 DIAGNOSIS — E11649 Type 2 diabetes mellitus with hypoglycemia without coma: Secondary | ICD-10-CM | POA: Diagnosis not present

## 2018-05-27 DIAGNOSIS — E785 Hyperlipidemia, unspecified: Secondary | ICD-10-CM

## 2018-05-27 DIAGNOSIS — I10 Essential (primary) hypertension: Secondary | ICD-10-CM

## 2018-05-27 DIAGNOSIS — Z794 Long term (current) use of insulin: Secondary | ICD-10-CM | POA: Diagnosis not present

## 2018-05-27 NOTE — Assessment & Plan Note (Signed)
No changes in simvastatin 40 mg daily. We will check lipid panel in 06/2018. Continue low-fat diet.

## 2018-05-27 NOTE — Assessment & Plan Note (Signed)
Adequately controlled. No changes today metformin or Basaglar. Continue following with endocrinologist.

## 2018-05-27 NOTE — Progress Notes (Signed)
Virtual Visit via Video Note   I connected with David Carr on 05/27/18 at  2:00 PM EDT by a video enabled telemedicine application and verified that I am speaking with the correct person using two identifiers.  Location patient: home Location provider:home office Persons participating in the virtual visit: patient, provider  I discussed the limitations of evaluation and management by telemedicine and the availability of in person appointments. The patient expressed understanding and agreed to proceed.   HPI: David Carr is a 73 years old male with history of DM 2, AML, hyperlipidemia, hypertension, thrombocytopenia, and vertigo among some; who I am seeing today to establish care. He follows with endocrinologist, Dr. Cruzita Lederer. Oncologist, Dr. Alvy Bimler.   Former PCP Dr Sherren Mocha. Last CPE 06/18/17.  DM II: Dx in the last 1990's. Currently he is on metformin XR 500 mg daily and Basaglar 16 units daily. Lab Results  Component Value Date   HGBA1C 6.8 (A) 05/09/2018  Denies nausea,vomiting, polydipsia,polyuria, or polyphagia.  He denies numbness, tingling, or burning feet sensation.  Hypertension: S/P mitral valve repair. He follows with Dr. Percival Spanish.  Currently he is on atenolol 12.5 mg daily. He checks BP regularly,low 100's/70's and HR in the 90's.  He denies headache, visual changes, chest pain, dyspnea, abdominal pain, decreased urine output, blood in the urine, edema, or neurologic deficit.  Lab Results  Component Value Date   CREATININE 1.00 08/30/2017   BUN 14 08/30/2017   NA 139 08/30/2017   K 4.0 08/30/2017   CL 105 08/30/2017   CO2 26 08/30/2017   Hyperlipidemia: Currently he is on simvastatin 40 mg daily. Tolerating medication well, no side effects reported. Negative for myalgias or rash.  Lab Results  Component Value Date   CHOL 143 06/18/2017   HDL 51.50 06/18/2017   LDLCALC 72 06/18/2017   LDLDIRECT 74.4 06/19/2006   TRIG 97.0 06/18/2017   CHOLHDL 3  06/18/2017     ROS: See pertinent positives and negatives per HPI.  Past Medical History:  Diagnosis Date  . AML (acute myeloid leukemia) in remission [facility]) oncologist-  dr Alvy Bimler-- per last note 10/ 2017 in remission 2 years   dx 09-11-2013  via bone marrow bx , FLT3 negative (NP M1 +)/  chemotherapy started 09-17-2013,  remission via marrow bx 11-06-2013,  4 cycles consolidation chemo w/ HiDAC 11-13-2013 to 03-05-2014  . BPH with urinary obstruction   . Chronic thrombocytopenic purpura ([place])   . Dental caries    periodontitis  . GERD (gastroesophageal reflux disease)   . History of kidney stones   . Hyperlipidemia   . Hypertension   . MVP (mitral valve prolapse)   . Pancytopenia, acquired ([place])   . 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 Severe mitral regurgitation   . Type 2 diabetes mellitus with hypoglycemia, with long-term current use of insulin ([facility]    endocrinologist-  dr gCon Memos . Wears denture    upper    Past Surgical History:  Procedure Laterality Date  . CARDIAC CATHETERIZATION    . CARDIOVASCULAR STRESS TEST  07/23/2013   Low risk nuclear study w/ mild inferior ischemia/  normal LV function and wall motion, ef 69%  . CYSTOSCOPY WITH RETROGRADE PYELOGRAM, URETEROSCOPY AND STENT PLACEMENT Right 04/20/2016   Procedure: CYSTOSCOPY WITH RETROGRADE PYELOGRAM, URETEROSCOPY , STONE  BASKETRY AND STENT PLACEMENT;  Surgeon: Franchot Gallo, MD;  Location: [facility];  Service: Urology;  Laterality: Right;  . EXTRACORPOREAL SHOCK WAVE LITHOTRIPSY  yrs ago  . HOLMIUM LASER APPLICATION Right 0/05/8887   Procedure: HOLMIUM LASER APPLICATION;  Surgeon: Franchot Gallo, MD;  Location: [facility];  Service: Urology;   Laterality: Right;  . INGUINAL HERNIA REPAIR Left 1990's  . MITRAL VALVE REPAIR N/A 06/21/2016   Procedure: MINIMALLY INVASIVE MITRAL VALVE REPAIR (MVR) USING SORIN MEMO 3D SIZE 32 SEMIRIGID ANNULOPLASTY RING;  Surgeon: Rexene Alberts, MD;  Location: [place];  Service: Open Heart Surgery;  Laterality: N/A;  . MULTIPLE EXTRACTIONS WITH ALVEOLOPLASTY N/A 06/08/2016   Procedure: MULTIPLE EXTRACTION WITH ALVEOLOPLASTY AND PRE-PROSTHETIC SURGERY AS NEEDED;  Surgeon: Lenn Cal, DDS;  Location: [place];  Service: Oral Surgery;  Laterality: N/A;  . PATENT FORAMEN OVALE(PFO) CLOSURE N/A 06/21/2016   Procedure: PATENT FORAMEN OVALE (PFO) CLOSURE;  Surgeon: Rexene Alberts, MD;  Location: [place];  Service: Open Heart Surgery;  Laterality: N/A;  . RIGHT/LEFT HEART CATH AND CORONARY ANGIOGRAPHY N/A 06/07/2016   Procedure: Right/Left Heart Cath and Coronary Angiography;  Surgeon: Sherren Mocha, MD;  Location: [place] CV LAB;  Service: Cardiovascular;  Laterality: N/A;  . ROTATOR CUFF REPAIR Right [id number]  . TEE WITHOUT CARDIOVERSION N/A 01/11/2016   Procedure: TRANSESOPHAGEAL ECHOCARDIOGRAM (TEE);  Surgeon: Pixie Casino, MD;  Location[facility] ENDOSCOPY;  Service: Cardiovascular;  Laterality: N/A; Severe David associated w/ flail P2 segment of MV;  no LAA thromus;  small PFO by saline microbubble contrast after valsalva; LVEF 55-60%  . TEE WITHOUT CARDIOVERSION N/A 06/21/2016   Procedure: TRANSESOPHAGEAL ECHOCARDIOGRAM (TEE);  Surgeon: Rexene Alberts, MD;  Location: [place];  Service: Open Heart Surgery;  Laterality: N/A;  . TENNIS ELBOW RELEASE/NIRSCHEL PROCEDURE Right 1990's  . TRANSTHORACIC ECHOCARDIOGRAM  12/22/2015   grade 1 diastolic dysfunction,  ef 55-60%/  moderate MVP involving posterior leaflet w/ partial flail and severe David via CW doppler (peak gradient 44mHg)/  trivial TR    Family History  Problem Relation Age of Onset  . Heart attack Father   . Alcoholism Father   . Obesity Father   .  Asthma Sister   . Cancer Daughter        carcinoid tumor    Social History   Socioeconomic History  . Marital status: Married    Spouse name: Not on file  . Number of children: 2  . Years of education: college grad  . Highest education level: Not on file  Occupational History    Comment: retired, NA  Social Needs  . Financial resource strain: Not on file  . Food insecurity:    Worry: Not on file    Inability: Not on file  . Transportation needs:    Medical: Not on file    Non-medical: Not on file  Tobacco Use  . Smoking status: Former Smoker    Packs/day: 1.50    Years: 20.00    Pack years: 30.00    Types: Cigarettes    Last attempt to quit: 04/18/1974    Years since quitting: 44.1  . Smokeless tobacco: Never Used  Substance and Sexual Activity  . Alcohol use: No  . Drug use: No  . Sexual activity: Not Currently  Lifestyle  . Physical activity:    Days per week: Not on file    Minutes per session: Not on file  . Stress: Not on file  Relationships  . Social connections:    Talks on phone: Not on file    Gets together: Not on file    Attends religious service: Not on file    Active member of club or organization: Not on file    Attends meetings of clubs or organizations: Not on file    Relationship status: Not on file  . Intimate partner violence:    Fear of current or ex partner: Not on file    Emotionally abused: Not on file    Physically abused: Not on file    Forced sexual activity: Not on file  Other Topics Concern  . Not on file  Social History Narrative   Lives wife,  Son   Coffee, 1 cup  daily      Current Outpatient Medications:  .  acetaminophen (TYLENOL) 500 MG tablet, Take 2 tablets (1,000 mg total) by mouth every 6 (six) hours as needed., Disp: 30 tablet, Rfl: 0 .  aspirin 81 MG tablet, Take 81 mg by mouth as needed. , Disp: , Rfl:  .  atenolol (TENORMIN) 25 MG tablet, Take 0.5 tablets (12.5 mg total) by mouth daily., Disp: 90 tablet, Rfl:  1 .  glucose blood (ACCU-CHEK GUIDE) test strip, Use to check blood sugar 3 times a day, Disp: 300 each, Rfl: 12 .  Insulin Glargine (BASAGLAR KWIKPEN) 100 UNIT/ML SOPN, INJECT 16 UNITS INTO THE SKIN AT BEDTIME, Disp: 15 pen, Rfl: 7 .  Insulin Pen Needle (BD PEN NEEDLE NANO U/F) 32G X 4 MM MISC, USE 4 times  A DAY, Disp: 300 each, Rfl: 3 .  Lancets (ACCU-CHEK MULTICLIX) lancets, Use as instructed to check blood sugar 3 times a day., Disp: 300 each, Rfl: 12 .  metFORMIN (GLUCOPHAGE-XR) 500 MG 24 hr tablet, TAKE 1 TABLET BY MOUTH DAILY WITH SUPPER, Disp: 90 tablet, Rfl: 3 .  simvastatin (ZOCOR) 20 MG tablet, Take 1 tablet (20 mg total) by mouth at bedtime., Disp: 90 tablet, Rfl: 1 No current facility-administered medications for this visit.   Facility-Administered Medications Ordered in Other Visits:  .  heparin lock flush 100 unit/mL, 500 Units, Intravenous, Once, Kale, Cloria Spring, MD .  sodium chloride 0.9 % injection 10 mL, 10 mL, Intravenous, PRN, Irene Limbo, Cloria Spring, MD  EXAMTonette Bihari per patient if applicable:BP 809/98   Pulse 90   Resp 12   GENERAL: alert, oriented, appears well and in no acute distress  HEENT: atraumatic, conjunttiva clear, no obvious abnormalities on inspection of face.  LUNGS: on inspection no signs of respiratory distress, breathing rate appears normal, no obvious gross SOB, gasping or wheezing  CV: no obvious cyanosis  MS: moves all visible extremities without noticeable abnormality  PSYCH/NEURO: pleasant and cooperative, no obvious depression or anxiety, speech and thought processing grossly intact  ASSESSMENT AND PLAN:  Discussed the following assessment and plan:  Essential hypertension  Hyperlipidemia, unspecified hyperlipidemia type  Type 2 diabetes mellitus with hypoglycemia without coma, with long-term current use of insulin (HCC)  Essential hypertension Problem is adequately controlled. No changes in atenolol 12.5 mg daily, side  effect discussed. Continue monitoring BP regularly. Continue low-salt diet.  Hyperlipidemia No changes in simvastatin 40 mg daily. We will check lipid panel in 06/2018. Continue low-fat diet.  Type 2 diabetes mellitus with hypoglycemia without coma, with long-term current use of insulin (HCC) Adequately controlled. No changes today metformin or Basaglar. Continue following with endocrinologist.     I discussed the assessment and treatment plan with  the patient. He was provided an opportunity to ask questions and all were answered. The patient agreed with the plan and demonstrated an understanding of the instructions.  He does not need refills at this time. He already has an appointment for CPE on 06/24/2018.   Return in about 4 weeks (around 06/24/2018) for already scheduled for CPE.    Lameeka Schleifer [place], MD

## 2018-05-27 NOTE — Assessment & Plan Note (Signed)
Problem is adequately controlled. No changes in atenolol 12.5 mg daily, side effect discussed. Continue monitoring BP regularly. Continue low-salt diet.

## 2018-05-28 IMAGING — CR DG CHEST 2V
2 series · 2 of 2 positions shown · non-contrast
Comparison: Chest CT 06/16/2016

CLINICAL DATA: Preop mitral valve repair

EXAM:
CHEST  2 VIEW

[w chest pa]
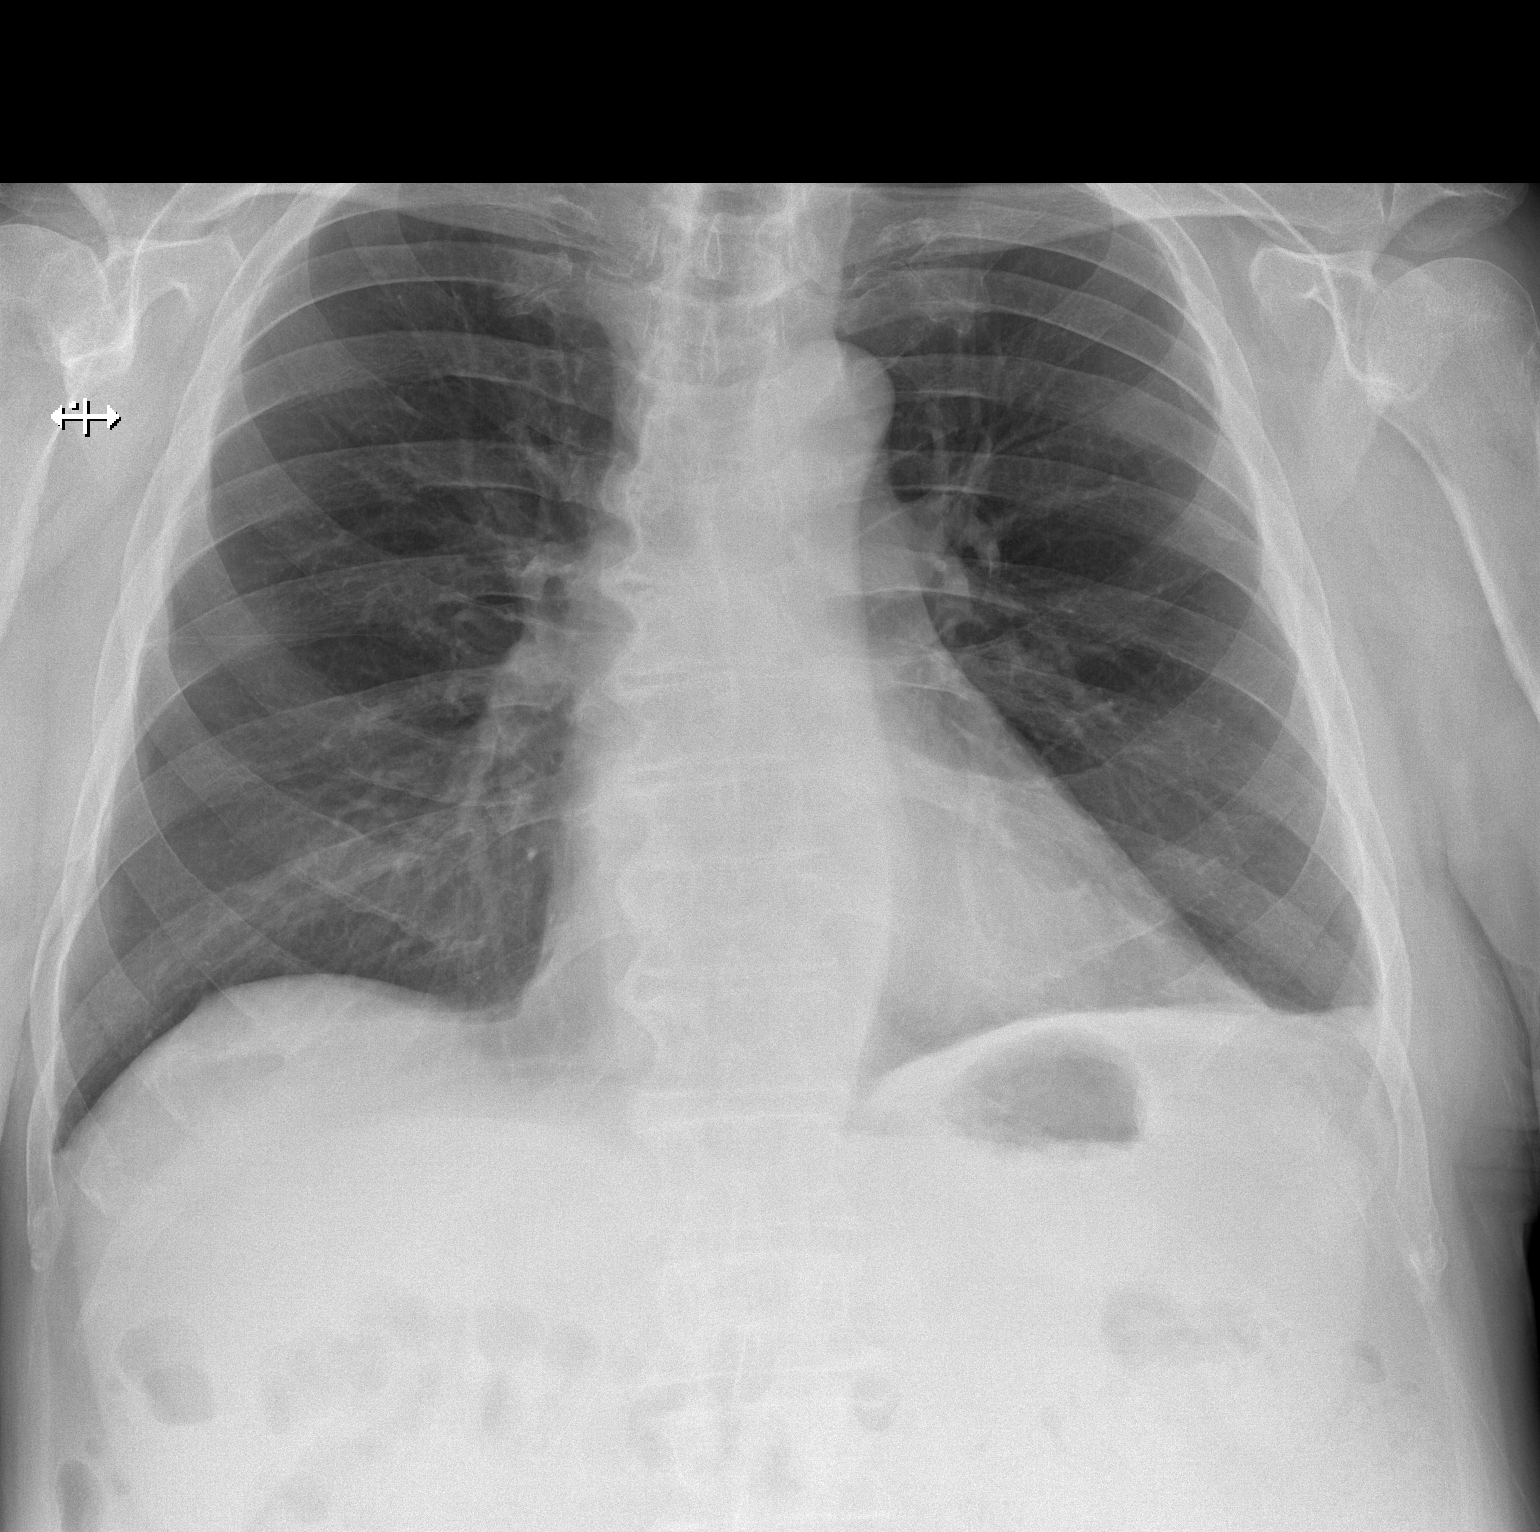

[w chest lat]
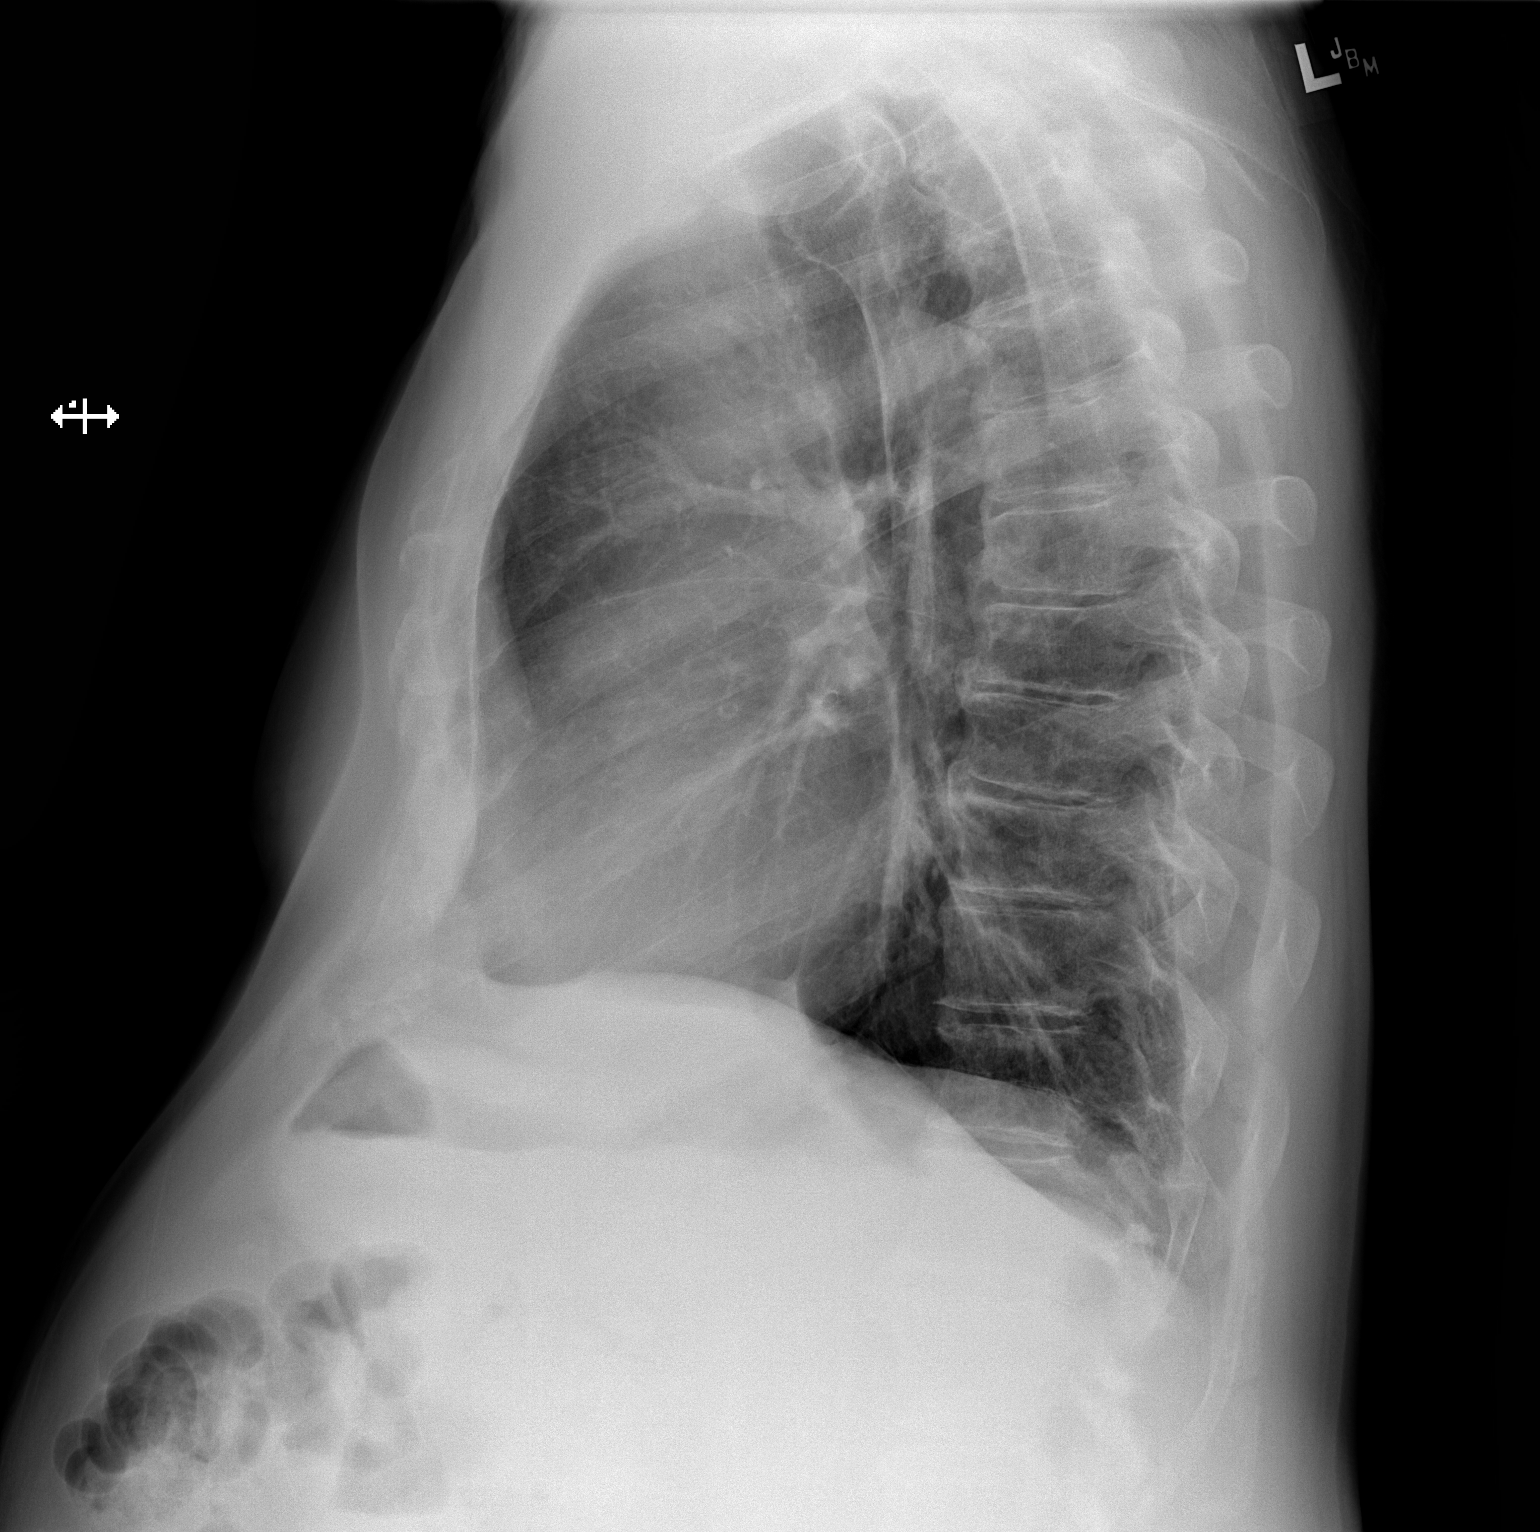

[2 of 2 positions shown; findings below may reference images not displayed]

FINDINGS: Heart and mediastinal contours are within normal limits. No focal
opacities or effusions. No acute bony abnormality.
IMPRESSION: No active cardiopulmonary disease.

## 2018-05-30 IMAGING — DX DG CHEST 1V PORT
1 series · 1 of 1 positions shown · non-contrast
Comparison: 06/19/2016

CLINICAL DATA: Atelectasis.  Postop

EXAM:
PORTABLE CHEST 1 VIEW

[chest ap]
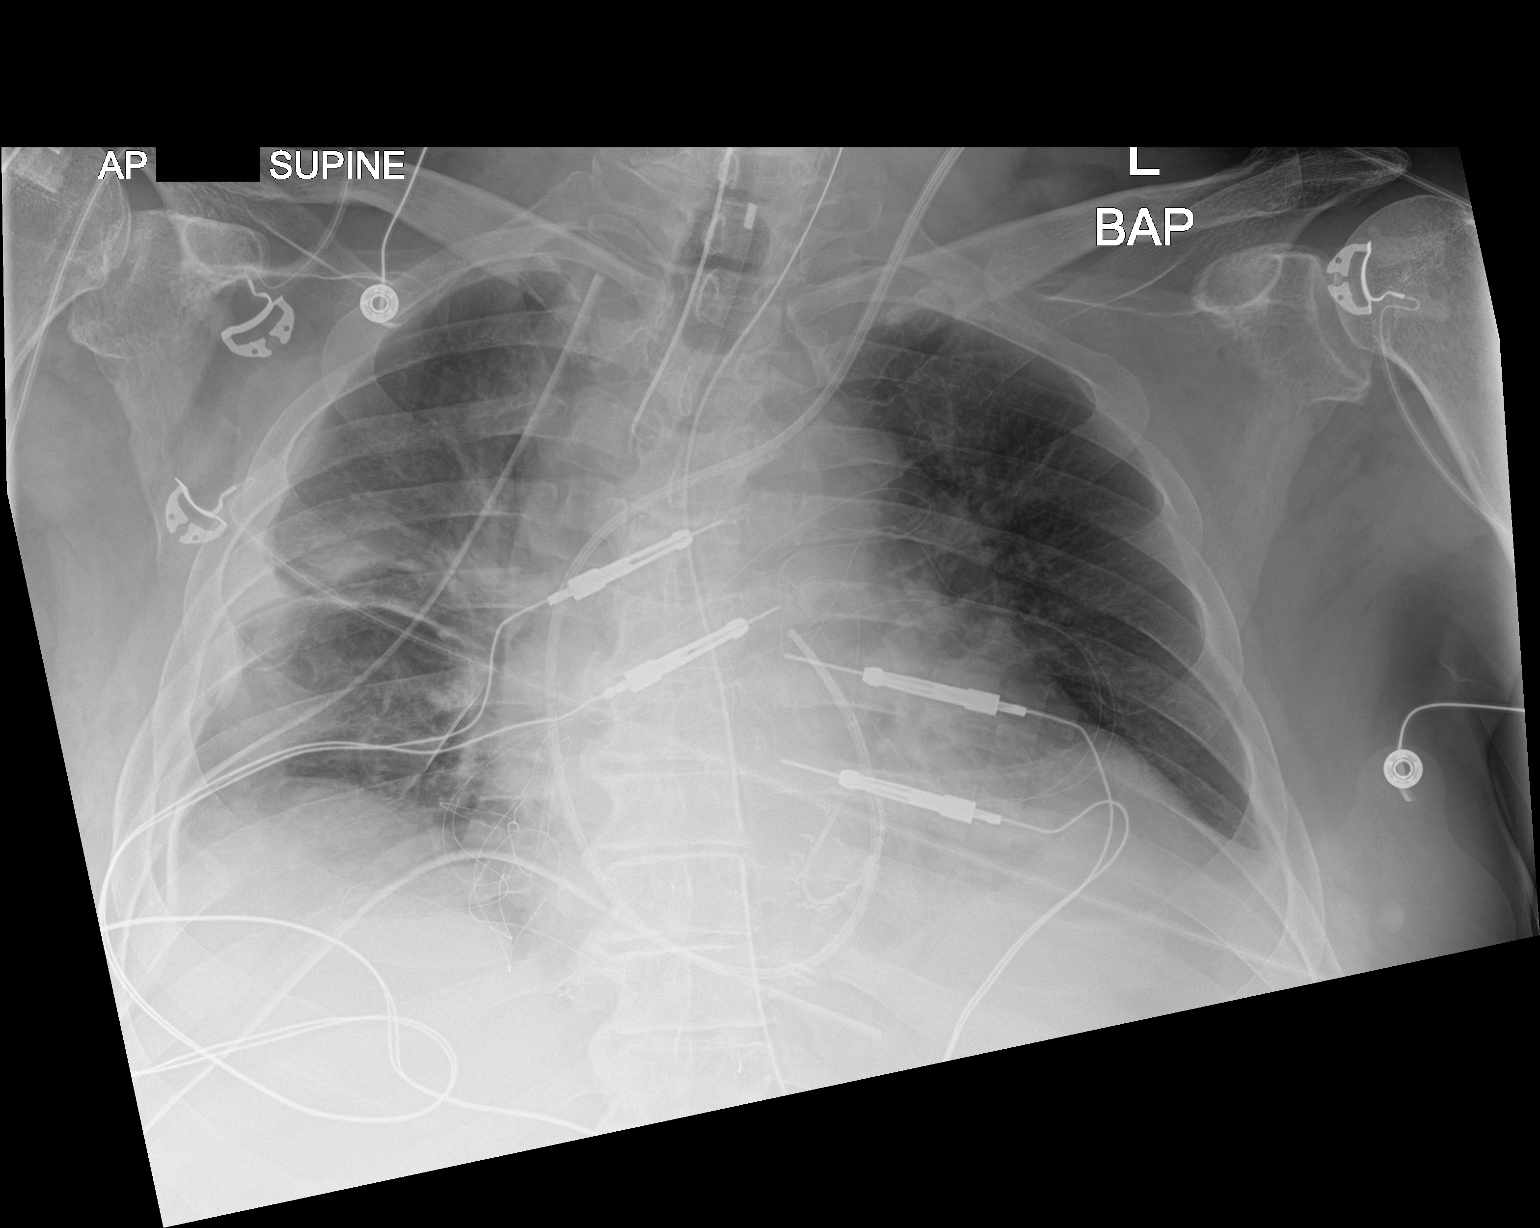

[1 of 1 positions shown; findings below may reference images not displayed]

FINDINGS: Postop valve replacement. Endotracheal tube in good position.
Swan-Ganz catheter in the main pulmonary artery. Right chest tube in
place. NG tube in the distal esophagus.

Negative for pneumothorax. Small bilateral effusions.
Hypoventilation with bibasilar atelectasis.
IMPRESSION: Hypoventilation with bibasilar atelectasis and small effusions

Side hole of the NG tube is in the distal esophagus.

## 2018-06-24 ENCOUNTER — Encounter: Payer: 59 | Admitting: Family Medicine

## 2018-07-26 ENCOUNTER — Telehealth: Payer: Self-pay | Admitting: Family Medicine

## 2018-07-26 ENCOUNTER — Other Ambulatory Visit: Payer: Self-pay | Admitting: *Deleted

## 2018-07-26 DIAGNOSIS — E7800 Pure hypercholesterolemia, unspecified: Secondary | ICD-10-CM

## 2018-07-26 MED ORDER — SIMVASTATIN 20 MG PO TABS: 20.0000 mg | ORAL_TABLET | Freq: Every day | ORAL | 1 refills | Status: DC

## 2018-07-26 NOTE — Telephone Encounter (Signed)
Copied from [place] [phone]. Topic: Quick Communication - Rx Refill/Question >> Jul 26, 2018 12:09 PM [place], [place] D wrote: Medication: simvastatin (ZOCOR) 20 MG tablet / Pt stated that he reached out to his pharmacy on 07/23/18 for a refill and they have been faxing over refill requests with no result. Pt is now out of medication and requesting a refill today. He would like a call when it is sent in but does not want this number saved to his chart. CB# [phone]  Has the patient contacted their pharmacy? Yes (Agent: If no, request that the patient contact the pharmacy for the refill.) (Agent: If yes, when and what did the pharmacy advise?)  Preferred Pharmacy (with phone number or street name):   Agent: Please be advised that RX refills may take up to 3 business days. We ask that you follow-up with your pharmacy.

## 2018-07-26 NOTE — Telephone Encounter (Signed)
Patient informed that Rx was sent to the pharmacy.

## 2018-08-28 ENCOUNTER — Other Ambulatory Visit: Payer: Self-pay | Admitting: Hematology and Oncology

## 2018-08-28 DIAGNOSIS — C9201 Acute myeloblastic leukemia, in remission: Secondary | ICD-10-CM

## 2018-08-29 ENCOUNTER — Inpatient Hospital Stay: Payer: 59 | Attending: Hematology and Oncology

## 2018-08-29 ENCOUNTER — Encounter: Payer: Self-pay | Admitting: Hematology and Oncology

## 2018-08-29 ENCOUNTER — Other Ambulatory Visit: Payer: Self-pay

## 2018-08-29 ENCOUNTER — Inpatient Hospital Stay: Payer: 59 | Admitting: Hematology and Oncology

## 2018-08-29 DIAGNOSIS — Z794 Long term (current) use of insulin: Secondary | ICD-10-CM | POA: Insufficient documentation

## 2018-08-29 DIAGNOSIS — Z9221 Personal history of antineoplastic chemotherapy: Secondary | ICD-10-CM

## 2018-08-29 DIAGNOSIS — Z79899 Other long term (current) drug therapy: Secondary | ICD-10-CM

## 2018-08-29 DIAGNOSIS — D696 Thrombocytopenia, unspecified: Secondary | ICD-10-CM | POA: Diagnosis not present

## 2018-08-29 DIAGNOSIS — Z7982 Long term (current) use of aspirin: Secondary | ICD-10-CM | POA: Insufficient documentation

## 2018-08-29 DIAGNOSIS — C9201 Acute myeloblastic leukemia, in remission: Secondary | ICD-10-CM | POA: Diagnosis not present

## 2018-08-29 LAB — CBC WITH DIFFERENTIAL/PLATELET
Abs Immature Granulocytes: 0.03 10*3/uL (ref 0.00–0.07)
Basophils Absolute: 0 10*3/uL (ref 0.0–0.1)
Basophils Relative: 1 %
Eosinophils Absolute: 0 10*3/uL (ref 0.0–0.5)
Eosinophils Relative: 1 %
HCT: 49.3 % (ref 39.0–52.0)
Hemoglobin: 17 g/dL (ref 13.0–17.0)
Immature Granulocytes: 1 %
Lymphocytes Relative: 26 %
Lymphs Abs: 1.1 10*3/uL (ref 0.7–4.0)
MCH: 31.3 pg (ref 26.0–34.0)
MCHC: 34.5 g/dL (ref 30.0–36.0)
MCV: 90.8 fL (ref 80.0–100.0)
Monocytes Absolute: 0.4 10*3/uL (ref 0.1–1.0)
Monocytes Relative: 8 %
Neutro Abs: 2.8 10*3/uL (ref 1.7–7.7)
Neutrophils Relative %: 63 %
Platelets: 122 10*3/uL — ABNORMAL LOW (ref 150–400)
RBC: 5.43 MIL/uL (ref 4.22–5.81)
RDW: 12.8 % (ref 11.5–15.5)
WBC: 4.3 10*3/uL (ref 4.0–10.5)
nRBC: 0 % (ref 0.0–0.2)

## 2018-08-29 MED ORDER — BASAGLAR KWIKPEN 100 UNIT/ML ~~LOC~~ SOPN: 14.0000 [IU] | PEN_INJECTOR | Freq: Every day | SUBCUTANEOUS | 7 refills | Status: DC

## 2018-08-29 NOTE — Progress Notes (Signed)
[facility] OFFICE PROGRESS NOTE  Patient Care Team: Martinique, Betty G, MD as PCP - General (Family Medicine)  ASSESSMENT & PLAN:  AML (acute myeloid leukemia) in remission [facility]) The patient is currently almost 5 years out from documentation of remission status for AML He remain cancer free He is a long-term cancer survivor I recommend discontinuation of follow-up. He will continue close monitoring by his primary care doctor He has my number to call if he have concerns about cancer recurrence We discussed the importance of annual influenza vaccination  Thrombocytopenia ([place]) The cause could be related to his underlying medical condition. It is mild and there is little change compared from previous platelet count. The patient denies recent history of bleeding such as epistaxis, hematuria or hematochezia. He is asymptomatic from the thrombocytopenia. I will observe for now.  he does not require further evaluation or treatment for this    No orders of the defined types were placed in this encounter.   INTERVAL HISTORY: Please see below for problem oriented charting. He returns for further follow-up He is doing well Denies recent infection, fever or chills No recent bleeding No recent heart issues  SUMMARY OF ONCOLOGIC HISTORY: Oncology History  AML (acute myeloid leukemia) in remission ([place])  09/11/2013 Bone Marrow Biopsy   Bone marrow biopsy showed AML, FLT3 negative, NPM1 positive   09/17/2013 -  Chemotherapy   He was given induction chemotherapy at [facility] (7+3)   10/01/2013 -  Chemotherapy   He had repeat induction chemotherapy due to persistent disease (5+2)   11/06/2013 Bone Marrow Biopsy   Bone marrow biopsy confirmed remission   11/13/2013 - 11/17/2013 Chemotherapy   He received cycle 1 of consolidation chemotherapy with HiDAC   12/18/2013 - 12/22/2013 Chemotherapy   He received cycle 2 of consolidation chemotherapy with HiDAC   01/23/2014 -  01/28/2014 Chemotherapy   he received cycle 3 of consolidation chemotherapy with HiDAC   03/05/2014 - 03/10/2014 Hospital Admission   he received cycle 4 of consolidation chemotherapy with HiDAC     REVIEW OF SYSTEMS:   Constitutional: Denies fevers, chills or abnormal weight loss Eyes: Denies blurriness of vision Ears, nose, mouth, throat, and face: Denies mucositis or sore throat Respiratory: Denies cough, dyspnea or wheezes Cardiovascular: Denies palpitation, chest discomfort or lower extremity swelling Gastrointestinal:  Denies nausea, heartburn or change in bowel habits Skin: Denies abnormal skin rashes Lymphatics: Denies new lymphadenopathy or easy bruising Neurological:Denies numbness, tingling or new weaknesses Behavioral/Psych: Mood is stable, no new changes  All other systems were reviewed with the patient and are negative.  I have reviewed the past medical history, past surgical history, social history and family history with the patient and they are unchanged from previous note.  ALLERGIES:  is allergic to prochlorperazine and morphine and related.  MEDICATIONS:  Current Outpatient Medications  Medication Sig Dispense Refill  . acetaminophen (TYLENOL) 500 MG tablet Take 2 tablets (1,000 mg total) by mouth every 6 (six) hours as needed. 30 tablet 0  . aspirin 81 MG tablet Take 81 mg by mouth as needed.     Marland Kitchen atenolol (TENORMIN) 25 MG tablet Take 0.5 tablets (12.5 mg total) by mouth daily. 90 tablet 1  . glucose blood (ACCU-CHEK GUIDE) test strip Use to check blood sugar 3 times a day 300 each 12  . Insulin Glargine (BASAGLAR KWIKPEN) 100 UNIT/ML SOPN Inject 0.14 mLs (14 Units total) into the skin at bedtime. 15 pen 7  . Insulin Pen  Needle (BD PEN NEEDLE NANO U/F) 32G X 4 MM MISC USE 4 times  A DAY 300 each 3  . Lancets (ACCU-CHEK MULTICLIX) lancets Use as instructed to check blood sugar 3 times a day. 300 each 12  . metFORMIN (GLUCOPHAGE-XR) 500 MG 24 hr tablet TAKE 1  TABLET BY MOUTH DAILY WITH SUPPER 90 tablet 3  . simvastatin (ZOCOR) 20 MG tablet Take 1 tablet (20 mg total) by mouth at bedtime. 90 tablet 1   No current facility-administered medications for this visit.    Facility-Administered Medications Ordered in Other Visits  Medication Dose Route Frequency Provider Last Rate Last Dose  . heparin lock flush 100 unit/mL  500 Units Intravenous Once Brunetta Genera, MD      . sodium chloride 0.9 % injection 10 mL  10 mL Intravenous PRN Brunetta Genera, MD        PHYSICAL EXAMINATION: ECOG PERFORMANCE STATUS: 0 - Asymptomatic  Vitals:   08/29/18 0840  BP: 133/81  Pulse: 75  Resp: 18  Temp: 98 F (36.7 C)  SpO2: 98%   Filed Weights   08/29/18 0840  Weight: 182 lb 12.8 oz (82.9 kg)    GENERAL:alert, no distress and comfortable SKIN: skin color, texture, turgor are normal, no rashes or significant lesions EYES: normal, Conjunctiva are pink and non-injected, sclera clear OROPHARYNX:no exudate, no erythema and lips, buccal mucosa, and tongue normal  NECK: supple, thyroid normal size, non-tender, without nodularity LYMPH:  no palpable lymphadenopathy in the cervical, axillary or inguinal LUNGS: clear to auscultation and percussion with normal breathing effort HEART: regular rate & rhythm and no murmurs and no lower extremity edema ABDOMEN:abdomen soft, non-tender and normal bowel sounds Musculoskeletal:no cyanosis of digits and no clubbing  NEURO: alert & oriented x 3 with fluent speech, no focal motor/sensory deficits  LABORATORY DATA:  I have reviewed the data as listed    Component Value Date/Time   NA 139 08/30/2017 1149   NA 139 07/03/2016 1233   NA 138 12/02/2015 1335   K 4.0 08/30/2017 1149   K 4.1 12/02/2015 1335   CL 105 08/30/2017 1149   CO2 26 08/30/2017 1149   CO2 24 12/02/2015 1335   GLUCOSE 184 (H) 08/30/2017 1149   GLUCOSE 252 (H) 12/02/2015 1335   BUN 14 08/30/2017 1149   BUN 15 07/03/2016 1233   BUN  14.2 12/02/2015 1335   CREATININE 1.00 08/30/2017 1149   CREATININE 0.90 01/03/2016 1056   CREATININE 1.0 12/02/2015 1335   CALCIUM 9.6 08/30/2017 1149   CALCIUM 9.0 12/02/2015 1335   PROT 6.9 08/30/2017 1149   PROT 6.6 12/02/2015 1335   ALBUMIN 3.8 08/30/2017 1149   ALBUMIN 3.5 12/02/2015 1335   AST 25 08/30/2017 1149   AST 21 12/02/2015 1335   ALT 30 08/30/2017 1149   ALT 28 12/02/2015 1335   ALKPHOS 77 08/30/2017 1149   ALKPHOS 79 12/02/2015 1335   BILITOT 0.7 08/30/2017 1149   BILITOT 0.61 12/02/2015 1335   GFRNONAA >60 08/30/2017 1149   GFRAA >60 08/30/2017 1149    No results found for: SPEP, UPEP  Lab Results  Component Value Date   WBC 4.3 08/29/2018   NEUTROABS 2.8 08/29/2018   HGB 17.0 08/29/2018   HCT 49.3 08/29/2018   MCV 90.8 08/29/2018   PLT 122 (L) 08/29/2018      Chemistry      Component Value Date/Time   NA 139 08/30/2017 1149   NA 139 07/03/2016 1233  NA 138 12/02/2015 1335   K 4.0 08/30/2017 1149   K 4.1 12/02/2015 1335   CL 105 08/30/2017 1149   CO2 26 08/30/2017 1149   CO2 24 12/02/2015 1335   BUN 14 08/30/2017 1149   BUN 15 07/03/2016 1233   BUN 14.2 12/02/2015 1335   CREATININE 1.00 08/30/2017 1149   CREATININE 0.90 01/03/2016 1056   CREATININE 1.0 12/02/2015 1335      Component Value Date/Time   CALCIUM 9.6 08/30/2017 1149   CALCIUM 9.0 12/02/2015 1335   ALKPHOS 77 08/30/2017 1149   ALKPHOS 79 12/02/2015 1335   AST 25 08/30/2017 1149   AST 21 12/02/2015 1335   ALT 30 08/30/2017 1149   ALT 28 12/02/2015 1335   BILITOT 0.7 08/30/2017 1149   BILITOT 0.61 12/02/2015 1335       All questions were answered. The patient knows to call the clinic with any problems, questions or concerns. No barriers to learning was detected.  I spent 10 minutes counseling the patient face to face. The total time spent in the appointment was 15 minutes and more than 50% was on counseling and review of test results  Heath Lark, MD 08/29/2018 3:29  PM

## 2018-08-29 NOTE — Assessment & Plan Note (Signed)
The cause could be related to his underlying medical condition. It is mild and there is little change compared from previous platelet count. The patient denies recent history of bleeding such as epistaxis, hematuria or hematochezia. He is asymptomatic from the thrombocytopenia. I will observe for now.  he does not require further evaluation or treatment for this

## 2018-08-29 NOTE — Assessment & Plan Note (Signed)
The patient is currently almost 5 years out from documentation of remission status for AML He remain cancer free He is a long-term cancer survivor I recommend discontinuation of follow-up. He will continue close monitoring by his primary care doctor He has my number to call if he have concerns about cancer recurrence We discussed the importance of annual influenza vaccination

## 2018-09-25 ENCOUNTER — Encounter: Payer: Self-pay | Admitting: Family Medicine

## 2018-09-25 ENCOUNTER — Other Ambulatory Visit: Payer: Self-pay

## 2018-09-25 ENCOUNTER — Ambulatory Visit (INDEPENDENT_AMBULATORY_CARE_PROVIDER_SITE_OTHER): Payer: 59 | Admitting: Family Medicine

## 2018-09-25 VITALS — BP 118/78 | HR 89 | Temp 97.9°F | Resp 12 | Ht 68.0 in | Wt 182.0 lb

## 2018-09-25 DIAGNOSIS — E11649 Type 2 diabetes mellitus with hypoglycemia without coma: Secondary | ICD-10-CM

## 2018-09-25 DIAGNOSIS — L989 Disorder of the skin and subcutaneous tissue, unspecified: Secondary | ICD-10-CM

## 2018-09-25 DIAGNOSIS — I10 Essential (primary) hypertension: Secondary | ICD-10-CM | POA: Diagnosis not present

## 2018-09-25 DIAGNOSIS — Z794 Long term (current) use of insulin: Secondary | ICD-10-CM

## 2018-09-25 DIAGNOSIS — E785 Hyperlipidemia, unspecified: Secondary | ICD-10-CM | POA: Diagnosis not present

## 2018-09-25 DIAGNOSIS — N401 Enlarged prostate with lower urinary tract symptoms: Secondary | ICD-10-CM

## 2018-09-25 DIAGNOSIS — Z0000 Encounter for general adult medical examination without abnormal findings: Secondary | ICD-10-CM

## 2018-09-25 DIAGNOSIS — R351 Nocturia: Secondary | ICD-10-CM | POA: Diagnosis not present

## 2018-09-25 LAB — LIPID PANEL
Cholesterol: 134 mg/dL (ref 0–200)
HDL: 48.2 mg/dL (ref >39.00)
LDL Cholesterol: 70 mg/dL (ref 0–99)
NonHDL: 86.14
Total CHOL/HDL Ratio: 3
Triglycerides: 79 mg/dL (ref 0.0–149.0)
VLDL: 15.8 mg/dL (ref 0.0–40.0)

## 2018-09-25 LAB — COMPREHENSIVE METABOLIC PANEL
ALT: 17 U/L (ref 0–53)
AST: 18 U/L (ref 0–37)
Albumin: 4.3 g/dL (ref 3.5–5.2)
Alkaline Phosphatase: 60 U/L (ref 39–117)
BUN: 14 mg/dL (ref 6–23)
CO2: 28 mEq/L (ref 19–32)
Calcium: 9.2 mg/dL (ref 8.4–10.5)
Chloride: 103 mEq/L (ref 96–112)
Creatinine, Ser: 0.97 mg/dL (ref 0.40–1.50)
GFR: 75.43 mL/min (ref >60.00)
Glucose, Bld: 152 mg/dL — ABNORMAL HIGH (ref 70–99)
Potassium: 4.7 mEq/L (ref 3.5–5.1)
Sodium: 139 mEq/L (ref 135–145)
Total Bilirubin: 1 mg/dL (ref 0.2–1.2)
Total Protein: 6.4 g/dL (ref 6.0–8.3)

## 2018-09-25 LAB — PSA: PSA: 2.91 ng/mL (ref 0.10–4.00)

## 2018-09-25 NOTE — Progress Notes (Signed)
HPI:  David Carr. is a 73 y.o.male here today for his routine physical examination.  Last CPE: 06/18/2017. He lives with his wife and son (70 yo).  Regular exercise 3 or more times per week: Walks his dog daily. Following a healthy diet: Doing better.  Chronic medical problems: Hx of AML, hyperlipidemia, hypertension, DM II,thrombocytopenia.   Immunization History  Administered Date(s) Administered  . Influenza Split 02/09/2011, 11/10/2011  . Influenza Whole 01/29/2009, 02/01/2010  . Influenza, High Dose Seasonal PF 12/09/2013, 10/20/2014, 10/26/2015, 11/07/2016, 12/07/2017  . Influenza,inj,Quad PF,6+ Mos 10/28/2012  . Pneumococcal Conjugate-13 08/28/2013  . Pneumococcal Polysaccharide-23 08/09/2010  . Td 02/13/1998, 08/03/2009  . Zoster 08/09/2010  . Zoster Recombinat (Shingrix) 04/15/2017, 10/27/2017   -Hep C screening: Done in 2015 (per pt report)  Last colon cancer screening: Colonoscopy is due but cancelled because COVID 19 concerns, he is planning on re-scheduling when he feels safe going to GI office. Last prostate ca screening: 3.4 in 06/2017. Denies dysuria,increased urinary frequency, gross hematuria,or decreased urine output. Nocturia x seldom.  -Denies high alcohol intake or Hx of illicit drug use. Former smoker.  -Concerns and/or follow up today:   No concerns. DM II,follows with endocrinologist.  Lab Results  Component Value Date   HGBA1C 6.8 (A) 05/09/2018   Lab Results  Component Value Date   MICROALBUR 1.6 06/18/2017   HTN, he is on Atenolol 25 mg 1/2 tab bid.  S/P minimally invasive mitral valve repair, ring annuloplasty on 06/21/2016.  He follows with Dr Percival Spanish.   Lab Results  Component Value Date   CREATININE 1.00 08/30/2017   BUN 14 08/30/2017   NA 139 08/30/2017   K 4.0 08/30/2017   CL 105 08/30/2017   CO2 26 08/30/2017   Lab Results  Component Value Date   ALT 30 08/30/2017   AST 25 08/30/2017   ALKPHOS 77  08/30/2017   BILITOT 0.7 08/30/2017   Hx of AML. Followed with oncologist until recently,according to pt, he was released . CBC annually was recommended.  Lab Results  Component Value Date   WBC 4.3 08/29/2018   HGB 17.0 08/29/2018   HCT 49.3 08/29/2018   MCV 90.8 08/29/2018   PLT 122 (L) 08/29/2018   Concerned about lesion on right temporal area, not tender or easy bleeding. Irritates lesion with his eye glasses, noted 2-3 weeks ago.Marland Kitchen Another lesion on right elbow noted today. He squeezed lesion today am and caused some bleeding.  Would like dermatology referral.   Review of Systems  Constitutional: Negative for activity change, appetite change, fatigue, fever and unexpected weight change.  HENT: Negative for dental problem, nosebleeds and sore throat.   Eyes: Negative for redness and visual disturbance.  Respiratory: Negative for cough, shortness of breath and wheezing.   Cardiovascular: Negative for chest pain, palpitations and leg swelling.  Gastrointestinal: Negative for abdominal pain, blood in stool, nausea and vomiting.  Endocrine: Negative for cold intolerance, heat intolerance, polydipsia, polyphagia and polyuria.  Genitourinary: Negative for decreased urine volume, dysuria, genital sores, hematuria and testicular pain.  Musculoskeletal: Negative for gait problem and myalgias.  Skin: Negative for rash and wound.  Neurological: Negative for syncope, weakness and headaches.  Hematological: Negative for adenopathy. Does not bruise/bleed easily.  Psychiatric/Behavioral: Negative for confusion and sleep disturbance. The patient is not nervous/anxious.   All other systems reviewed and are negative.   Current Outpatient Medications on File Prior to Visit  Medication Sig Dispense Refill  .  acetaminophen (TYLENOL) 500 MG tablet Take 2 tablets (1,000 mg total) by mouth every 6 (six) hours as needed. 30 tablet 0  . aspirin 81 MG tablet Take 81 mg by mouth as needed.     Marland Kitchen  atenolol (TENORMIN) 25 MG tablet Take 0.5 tablets (12.5 mg total) by mouth daily. 90 tablet 1  . glucose blood (ACCU-CHEK GUIDE) test strip Use to check blood sugar 3 times a day 300 each 12  . Insulin Glargine (BASAGLAR KWIKPEN) 100 UNIT/ML SOPN Inject 0.14 mLs (14 Units total) into the skin at bedtime. 15 pen 7  . Insulin Pen Needle (BD PEN NEEDLE NANO U/F) 32G X 4 MM MISC USE 4 times  A DAY 300 each 3  . Lancets (ACCU-CHEK MULTICLIX) lancets Use as instructed to check blood sugar 3 times a day. 300 each 12  . metFORMIN (GLUCOPHAGE-XR) 500 MG 24 hr tablet TAKE 1 TABLET BY MOUTH DAILY WITH SUPPER 90 tablet 3  . simvastatin (ZOCOR) 20 MG tablet Take 1 tablet (20 mg total) by mouth at bedtime. 90 tablet 1   Current Facility-Administered Medications on File Prior to Visit  Medication Dose Route Frequency Provider Last Rate Last Dose  . heparin lock flush 100 unit/mL  500 Units Intravenous Once Brunetta Genera, MD      . sodium chloride 0.9 % injection 10 mL  10 mL Intravenous PRN Brunetta Genera, MD         Past Medical History:  Diagnosis Date  . AML (acute myeloid leukemia) in remission [facility]) oncologist-  dr Alvy Bimler-- per last note 10/ 2017 in remission 2 years   dx 09-11-2013  via bone marrow bx , FLT3 negative (NP M1 +)/  chemotherapy started 09-17-2013,  remission via marrow bx 11-06-2013,  4 cycles consolidation chemo w/ HiDAC 11-13-2013 to 03-05-2014  . BPH with urinary obstruction   . Chronic thrombocytopenic purpura ([place])   . Dental caries    periodontitis  . GERD (gastroesophageal reflux disease)   . History of kidney stones   . Hyperlipidemia   . Hypertension   . MVP (mitral valve prolapse)   . Pancytopenia, acquired ([place])   . 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 Severe mitral regurgitation   . Type 2 diabetes mellitus with hypoglycemia, with long-term current use of insulin ([facility]    endocrinologist-  dr gCon Memos . Wears denture    upper    Past Surgical History:  Procedure Laterality Date  . CARDIAC CATHETERIZATION    . CARDIOVASCULAR STRESS TEST  07/23/2013   Low risk nuclear study w/ mild inferior ischemia/  normal LV function and wall motion, ef 69%  . CYSTOSCOPY WITH RETROGRADE PYELOGRAM, URETEROSCOPY AND STENT PLACEMENT Right 04/20/2016   Procedure: CYSTOSCOPY WITH RETROGRADE PYELOGRAM, URETEROSCOPY , STONE BASKETRY AND STENT PLACEMENT;  Surgeon: SFranchot Gallo MD;  [facility]  Service: Urology;  Laterality: Right;  . EXTRACORPOREAL SHOCK WAVE LITHOTRIPSY  yrs ago  . HOLMIUM LASER APPLICATION Right 39/02/4780  Procedure: HOLMIUM LASER APPLICATION;  Surgeon: SFranchot Gallo MD;  [facility]  Service: Urology;  Laterality: Right;  . INGUINAL HERNIA REPAIR Left 1990's  . MITRAL VALVE REPAIR N/A 06/21/2016   Procedure: MINIMALLY INVASIVE MITRAL VALVE REPAIR (MVR) USING SORIN MEMO 3D SIZE 32  SEMIRIGID ANNULOPLASTY RING;  Surgeon: Rexene Alberts, MD;  Location: [place];  Service: Open Heart Surgery;  Laterality: N/A;  . MULTIPLE EXTRACTIONS WITH ALVEOLOPLASTY N/A 06/08/2016   Procedure: MULTIPLE EXTRACTION WITH ALVEOLOPLASTY AND PRE-PROSTHETIC SURGERY AS NEEDED;  Surgeon: Lenn Cal, DDS;  Location: [place];  Service: Oral Surgery;  Laterality: N/A;  . PATENT FORAMEN OVALE(PFO) CLOSURE N/A 06/21/2016   Procedure: PATENT FORAMEN OVALE (PFO) CLOSURE;  Surgeon: Rexene Alberts, MD;  Location: [place];  Service: Open Heart Surgery;  Laterality: N/A;  . RIGHT/LEFT HEART CATH AND CORONARY ANGIOGRAPHY N/A 06/07/2016   Procedure: Right/Left Heart Cath and Coronary Angiography;  Surgeon: Sherren Mocha, MD;  Location: [place] CV LAB;  Service: Cardiovascular;  Laterality: N/A;   . ROTATOR CUFF REPAIR Right [id number]  . TEE WITHOUT CARDIOVERSION N/A 01/11/2016   Procedure: TRANSESOPHAGEAL ECHOCARDIOGRAM (TEE);  Surgeon: Pixie Casino, MD;  [facility] ENDOSCOPY;  Service: Cardiovascular;  Laterality: N/A; Severe MR associated w/ flail P2 segment of MV;  no LAA thromus;  small PFO by saline microbubble contrast after valsalva; LVEF 55-60%  . TEE WITHOUT CARDIOVERSION N/A 06/21/2016   Procedure: TRANSESOPHAGEAL ECHOCARDIOGRAM (TEE);  Surgeon: Rexene Alberts, MD;  Location: [place];  Service: Open Heart Surgery;  Laterality: N/A;  . TENNIS ELBOW RELEASE/NIRSCHEL PROCEDURE Right 1990's  . TRANSTHORACIC ECHOCARDIOGRAM  12/22/2015   grade 1 diastolic dysfunction,  ef 55-60%/  moderate MVP involving posterior leaflet w/ partial flail and severe MR via CW doppler (peak gradient 69mHg)/  trivial TR    Allergies  Allergen Reactions  . Prochlorperazine Other (See Comments)    ARRHYTHMIAS  . Morphine And Related     Shuts bladder down.    Family History  Problem Relation Age of Onset  . Heart attack Father   . Alcoholism Father   . Obesity Father   . Asthma Sister   . Cancer Daughter        carcinoid tumor    Social History   Socioeconomic History  . Marital status: Married    Spouse name: Not on file  . Number of children: 2  . Years of education: college grad  . Highest education level: Not on file  Occupational History    Comment: retired, NA  Social Needs  . Financial resource strain: Not on file  . Food insecurity    Worry: Not on file    Inability: Not on file  . Transportation needs    Medical: Not on file    Non-medical: Not on file  Tobacco Use  . Smoking status: Former Smoker    Packs/day: 1.50    Years: 20.00    Pack years: 30.00    Types: Cigarettes    Quit date: 04/18/1974    Years since quitting: 44.4  . Smokeless tobacco: Never Used  Substance and Sexual Activity  . Alcohol use: No  . Drug use: No  . Sexual activity: Not Currently   Lifestyle  . Physical activity    Days per week: Not on file    Minutes per session: Not on file  . Stress: Not on file  Relationships  . Social [place] phone: Not on file    Gets together: Not on file    Attends religious service: Not on file    Active member of club or organization: Not on file    Attends meetings of clubs or organizations: Not on file    Relationship status: Not  on file  Other Topics Concern  . Not on file  Social History Narrative   Lives wife,  Son   Coffee, 1 cup  daily    Vitals:   09/25/18 0802  BP: 118/78  Pulse: 89  Resp: 12  Temp: 97.9 F (36.6 C)  SpO2: 98%   Body mass index is 27.67 kg/m.   Wt Readings from Last 3 Encounters:  09/25/18 182 lb (82.6 kg)  08/29/18 182 lb 12.8 oz (82.9 kg)  05/09/18 180 lb (81.6 kg)    Physical Exam  Nursing note and vitals reviewed. Constitutional: He is oriented to person, place, and time. He appears well-developed and well-nourished. No distress.  HENT:  Head: Atraumatic.  Right Ear: Tympanic membrane, external ear and ear canal normal.  Left Ear: Tympanic membrane, external ear and ear canal normal.  Mouth/Throat: Oropharynx is clear and moist and mucous membranes are normal.  Eyes: Pupils are equal, round, and reactive to light. Conjunctivae and EOM are normal.  Neck: Normal range of motion. No tracheal deviation present. No thyromegaly present.  Cardiovascular: Normal rate and regular rhythm.  No murmur heard. Pulses:      Dorsalis pedis pulses are 2+ on the right side and 2+ on the left side.  Respiratory: Effort normal and breath sounds normal. No respiratory distress.  GI: Soft. He exhibits no mass. There is no hepatomegaly. There is no abdominal tenderness.  Genitourinary:    Genitourinary Comments: Refused,no concerns.   Musculoskeletal:        General: No tenderness or edema.     Comments: No major deformities appreciated and no signs of synovitis.  Lymphadenopathy:     He has no cervical adenopathy.       Right: No supraclavicular adenopathy present.       Left: No supraclavicular adenopathy present.  Neurological: He is alert and oriented to person, place, and time. He has normal strength. No cranial nerve deficit or sensory deficit. Coordination and gait normal.  Reflex Scores:      Bicep reflexes are 2+ on the right side and 2+ on the left side.      Patellar reflexes are 2+ on the right side and 2+ on the left side. Skin: Skin is warm. Lesion noted. No rash noted. No erythema.  Right temporal area 2-3 mm, minimally hyperpigmented. Right elbow 2 mm rounded lesion, rest of bleeding. No erythema and no tenderness.  Psychiatric: He has a normal mood and affect. Cognition and memory are normal.  Well groomed,good eye contact.    ASSESSMENT AND PLAN:  David Carr was seen today for annual exam.  Diagnoses and all orders for this visit:  Orders Placed This Encounter  Procedures  . Comprehensive metabolic panel  . Lipid panel  . PSA(Must document that pt has been informed of limitations of PSA testing.)  . Microalbumin / creatinine urine ratio  . Ambulatory referral to Dermatology   Lab Results  Component Value Date   CREATININE 0.97 09/25/2018   BUN 14 09/25/2018   NA 139 09/25/2018   K 4.7 09/25/2018   CL 103 09/25/2018   CO2 28 09/25/2018   Lab Results  Component Value Date   PSA 2.91 09/25/2018   Lab Results  Component Value Date   ALT 17 09/25/2018   AST 18 09/25/2018   ALKPHOS 60 09/25/2018   BILITOT 1.0 09/25/2018   Lab Results  Component Value Date   MICROALBUR 1.0 09/25/2018   Lab Results  Component Value Date  CHOL 134 09/25/2018   HDL 48.20 09/25/2018   LDLCALC 70 09/25/2018   LDLDIRECT 74.4 06/19/2006   TRIG 79.0 09/25/2018   CHOLHDL 3 09/25/2018    Routine general medical examination at a health care facility We discussed the importance of regular physical activity and healthy diet for prevention of  chronic illness and/or complications. Preventive guidelines reviewed. Vaccination up to date.  Next CPE in a year.  Essential hypertension BP adequately controlled. No changes in current management., Monitor BP regularly recommended.  -     Comprehensive metabolic panel  Hyperlipidemia, unspecified hyperlipidemia type No changes in current management, will follow labs done today and will give further recommendations accordingly.  -     Comprehensive metabolic panel -     Lipid panel  BPH associated with nocturia Minimal symptoms. Further recommendations will be given according to PSA results.  -     PSA(Must document that pt has been informed of limitations of PSA testing.)  Type 2 diabetes mellitus with hypoglycemia without coma, with long-term current use of insulin (HCC) Continue following with Dr Milas Hock.  Leisa Lenz / creatinine urine ratio  Scalp lesion Benign characteristics. Frequent irritation when combing.  Derma referral placed.  -     Ambulatory referral to Dermatology   Return in 1 year (on 09/25/2019) for cpe.    Betty G. Martinique, MD  [facility]. [place] office.

## 2018-09-25 NOTE — Patient Instructions (Addendum)
  A few things to remember from today's visit:   Routine general medical examination at a health care facility  Essential hypertension - Plan: Comprehensive metabolic panel  Hyperlipidemia, unspecified hyperlipidemia type - Plan: Comprehensive metabolic panel, Lipid panel  BPH associated with nocturia - Plan: PSA(Must document that pt has been informed of limitations of PSA testing.)  Type 2 diabetes mellitus with hypoglycemia without coma, with long-term current use of insulin ([place]) - Plan: Microalbumin / creatinine urine ratio  Scalp lesion   Please be sure medication list is accurate. If a new problem present, please set up appointment sooner than planned today.         A few tips:  -As we age balance is not as good as it was, so there is a higher risks for falls. Please remove small rugs and furniture that is "in your way" and could increase the risk of falls. Stretching exercises may help with fall prevention: Yoga and Tai Chi are some examples. Low impact exercise is better, so you are not very achy the next day.  -Sun screen and avoidance of direct sun light recommended. Caution with dehydration, if working outdoors be sure to drink enough fluids.  - Some medications are not safe as we age, increases the risk of side effects and can potentially interact with other medication you are also taken;  including some of over the counter medications. Be sure to let me know when you start a new medication even if it is a dietary/vitamin supplement.   -Healthy diet low in red meet/animal fat and sugar + regular physical activity is recommended.

## 2018-09-26 LAB — MICROALBUMIN / CREATININE URINE RATIO
Creatinine,U: 113.1 mg/dL
Microalb Creat Ratio: 0.9 mg/g (ref 0.0–30.0)
Microalb, Ur: 1 mg/dL (ref 0.0–1.9)

## 2018-09-29 DIAGNOSIS — I71019 Dissection of thoracic aorta, unspecified: Secondary | ICD-10-CM | POA: Insufficient documentation

## 2018-09-29 DIAGNOSIS — I7101 Dissection of thoracic aorta: Secondary | ICD-10-CM | POA: Insufficient documentation

## 2018-09-29 MED ORDER — ATENOLOL 25 MG PO TABS: 12.5000 mg | ORAL_TABLET | Freq: Every day | ORAL | 2 refills | Status: DC

## 2018-10-03 ENCOUNTER — Other Ambulatory Visit: Payer: Self-pay

## 2018-10-07 ENCOUNTER — Encounter: Payer: Self-pay | Admitting: Internal Medicine

## 2018-10-07 ENCOUNTER — Ambulatory Visit (INDEPENDENT_AMBULATORY_CARE_PROVIDER_SITE_OTHER): Payer: 59 | Admitting: Internal Medicine

## 2018-10-07 ENCOUNTER — Other Ambulatory Visit: Payer: Self-pay

## 2018-10-07 VITALS — BP 118/70 | HR 80 | Ht 68.0 in | Wt 182.0 lb

## 2018-10-07 DIAGNOSIS — E663 Overweight: Secondary | ICD-10-CM

## 2018-10-07 DIAGNOSIS — Z794 Long term (current) use of insulin: Secondary | ICD-10-CM

## 2018-10-07 DIAGNOSIS — E785 Hyperlipidemia, unspecified: Secondary | ICD-10-CM | POA: Diagnosis not present

## 2018-10-07 DIAGNOSIS — E11649 Type 2 diabetes mellitus with hypoglycemia without coma: Secondary | ICD-10-CM | POA: Diagnosis not present

## 2018-10-07 LAB — POCT GLYCOSYLATED HEMOGLOBIN (HGB A1C): Hemoglobin A1C: 6.8 % — AB (ref 4.0–5.6)

## 2018-10-07 MED ORDER — BD PEN NEEDLE NANO U/F 32G X 4 MM MISC: 3 refills | Status: DC

## 2018-10-07 NOTE — Progress Notes (Signed)
Patient ID: David Carr., male   DOB: 1945/09/10, 73 y.o.   MRN: [id number]   HPI: David Carr. is a 73 y.o.-year-old male, returning for f/u for DM2, dx at the end of the 69s, insulin-dependent since ~2014, uncontrolled, without long term complications. Last visit 5 months ago.  Last hemoglobin A1c was: Lab Results  Component Value Date   HGBA1C 6.8 (A) 05/09/2018   HGBA1C 6.7 (A) 01/08/2018   HGBA1C 6.5 (A) 09/06/2017   He is on: - Metformin ER [phone] mg with dinner >> 500 mg with dinner (dizziness and shortness of breath with higher doses) - Basaglar 14 units at bedtime He stopped Jardiance 10 mg b/c increased urination and dehydration (felt like being drunk). He was on 45 units of Humalog 75/25 at bedtime in the past.  Pt checks his sugars 2-3 times a day:: - am:  115 >> 88-120, 131, 148 >> 90s-115 >> 116-140, 150 - 2h after b'fast: 107-148 >> 126, 128 >> n/c  >> 120, 181,  254 - before lunch: 107-136 >> 96-116, 139 >> 115-130 >> 98-125 - 2h after lunch: 150-195 >> 152 >> 193 >> n/c >> 117-181 - before dinner: 78-115 >> 76, 86-140, 151 >> 140-220 >> 94-124, 174, 182 (grapes) - 2h after dinner: 139-175, 182, 188 >> 99-190 >> 170-180, occasionally >200 >> 120-192 - bedtime: 125 >> n/c >> 144-168, 223 >> see above - nighttime: n/c Lowest sugar was: 70s x2 >> 92.  He has hypoglycemia awareness in the 80s He had an episode of loss of consciousness when sugars reached 60 in 07/17/2016.  Highest sugar was 200s >> 254 (dehydration).  Glucometer: Freestyle Lite  Pt's meals are: - Breakfast: coffee + nab or slice pound cake + pop tarts - Lunch: fast food or sandwich or skips - Dinner: meat + veggies  - Snacks: 1, before bedtime  No regular sodas.  He continues to walk twice a day which is slightly  -No CKD, last BUN/creatinine:  Lab Results  Component Value Date   BUN 14 09/25/2018   BUN 14 08/30/2017   CREATININE 0.97 09/25/2018   CREATININE 1.00  08/30/2017   -+ HL; last set of lipids: Lab Results  Component Value Date   CHOL 134 09/25/2018   HDL 48.20 09/25/2018   LDLCALC 70 09/25/2018   LDLDIRECT 74.4 06/19/2006   TRIG 79.0 09/25/2018   CHOLHDL 3 09/25/2018  On Zocor. - last eye exam was in 11/2017: No DR - no numbness and tingling in his feet.  He has AML in remission.  He was recently discharged by Dr. Alvy Bimler from the oncology clinic due to persistent remission.  ROS: Constitutional: no weight gain/no weight loss, no fatigue, no subjective hyperthermia, no subjective hypothermia Eyes: no blurry vision, no xerophthalmia ENT: no sore throat, no nodules palpated in neck, no dysphagia, no odynophagia, no hoarseness Cardiovascular: no CP/no SOB/no palpitations/no leg swelling Respiratory: no cough/no SOB/no wheezing Gastrointestinal: no N/no V/no D/no C/no acid reflux Musculoskeletal: no muscle aches/no joint aches Skin: no rashes, no hair loss Neurological: no tremors/no numbness/no tingling/no dizziness  I reviewed pt's medications, allergies, PMH, social hx, family hx, and changes were documented in the history of present illness. Otherwise, unchanged from my initial visit note.  Past Medical History:  Diagnosis Date  . AML (acute myeloid leukemia) in remission [facility]) oncologist-  dr Alvy Bimler-- per last note 10/ 2017 in remission 2 years   dx 09-11-2013  via bone marrow bx , FLT3  negative (NP M1 +)/  chemotherapy started 09-17-2013,  remission via marrow bx 11-06-2013,  4 cycles consolidation chemo w/ HiDAC 11-13-2013 to 03-05-2014  . BPH with urinary obstruction   . Chronic thrombocytopenic purpura ([place])   . Dental caries    periodontitis  . GERD (gastroesophageal reflux disease)   . History of kidney stones   . Hyperlipidemia   . Hypertension   . MVP (mitral valve prolapse)   . Pancytopenia, acquired ([place])   . 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 Severe mitral regurgitation   . Type 2 diabetes mellitus with hypoglycemia, with long-term current use of insulin ([facility]    endocrinologist-  dr gCon Memos . Wears denture    upper   Past Surgical History:  Procedure Laterality Date  . CARDIAC CATHETERIZATION    . CARDIOVASCULAR STRESS TEST  07/23/2013   Low risk nuclear study w/ mild inferior ischemia/  normal LV function and wall motion, ef 69%  . CYSTOSCOPY WITH RETROGRADE PYELOGRAM, URETEROSCOPY AND STENT PLACEMENT Right 04/20/2016   Procedure: CYSTOSCOPY WITH RETROGRADE PYELOGRAM, URETEROSCOPY , STONE BASKETRY AND STENT PLACEMENT;  Surgeon: SFranchot Gallo MD;  [facility]  Service: Urology;  Laterality: Right;  . EXTRACORPOREAL SHOCK WAVE LITHOTRIPSY  yrs ago  . HOLMIUM LASER APPLICATION Right 31/02/209  Procedure: HOLMIUM LASER APPLICATION;  Surgeon: SFranchot Gallo MD;  [facility]  Service: Urology;  Laterality: Right;  . INGUINAL HERNIA REPAIR Left 1990's  . MITRAL VALVE REPAIR N/A 06/21/2016   Procedure: MINIMALLY INVASIVE MITRAL VALVE REPAIR (MVR) USING SORIN MEMO 3D SIZE 32 SEMIRIGID ANNULOPLASTY RING;  Surgeon: ORexene Alberts MD;  [place] [facility]  Service: Open Heart Surgery;  Laterality: N/A;  . MULTIPLE EXTRACTIONS WITH ALVEOLOPLASTY N/A 06/08/2016   Procedure: MULTIPLE EXTRACTION WITH ALVEOLOPLASTY AND PRE-PROSTHETIC SURGERY AS NEEDED;  Surgeon: RLenn Cal DDS;  [place]  Service: Oral Surgery;  Laterality: N/A;  . PATENT FORAMEN OVALE(PFO) CLOSURE N/A 06/21/2016   Procedure: PATENT FORAMEN OVALE (PFO) CLOSURE;  Surgeon: ORexene Alberts MD;  [place]  Service: Open Heart Surgery;  Laterality: N/A;  . RIGHT/LEFT HEART CATH AND CORONARY ANGIOGRAPHY N/A 06/07/2016   Procedure:  Right/Left Heart Cath and Coronary Angiography;  Surgeon: MSherren Mocha MD;  [place] [facility];  Service: Cardiovascular;  Laterality: N/A;  . ROTATOR CUFF REPAIR Right [id number] . TEE WITHOUT CARDIOVERSION N/A 01/11/2016   Procedure: TRANSESOPHAGEAL ECHOCARDIOGRAM (TEE);  Surgeon: KPixie Casino MD;  [facility];  Service: Cardiovascular;  Laterality: N/A; Severe MR associated w/ flail P2 segment of MV;  no LAA thromus;  small PFO by saline microbubble contrast after valsalva; LVEF 55-60%  . TEE WITHOUT CARDIOVERSION N/A 06/21/2016   Procedure: TRANSESOPHAGEAL ECHOCARDIOGRAM (TEE);  Surgeon: ORexene Alberts MD;  [place]  Service: Open Heart Surgery;  Laterality: N/A;  . TENNIS ELBOW RELEASE/NIRSCHEL PROCEDURE Right 1990's  . TRANSTHORACIC ECHOCARDIOGRAM  12/22/2015   grade 1 diastolic dysfunction,  ef 55-60%/  moderate MVP involving posterior leaflet w/ partial flail and severe MR via CW doppler (peak gradient 343mg)/  trivial TR   Social History   Social History  . Marital status: Married    Spouse name: N/A  . Number of children: 2  Occupational History  . accountant   Social History Main Topics  . Smoking status: Former Research scientist (life sciences)  . Smokeless tobacco: Never Used  . Alcohol use No  . Drug use: No   Current Outpatient Medications on File Prior to Visit  Medication Sig Dispense Refill  . acetaminophen (TYLENOL) 500 MG tablet Take 2 tablets (1,000 mg total) by mouth every 6 (six) hours as needed. 30 tablet 0  . aspirin 81 MG tablet Take 81 mg by mouth as needed.     Marland Kitchen atenolol (TENORMIN) 25 MG tablet Take 0.5 tablets (12.5 mg total) by mouth daily. 90 tablet 2  . glucose blood (ACCU-CHEK GUIDE) test strip Use to check blood sugar 3 times a day 300 each 12  . Insulin Glargine (BASAGLAR KWIKPEN) 100 UNIT/ML SOPN Inject 0.14 mLs (14 Units total) into the skin at bedtime. 15 pen 7  . Insulin Pen Needle (BD PEN NEEDLE NANO U/F) 32G X 4 MM MISC USE 4 times   A DAY 300 each 3  . Lancets (ACCU-CHEK MULTICLIX) lancets Use as instructed to check blood sugar 3 times a day. 300 each 12  . metFORMIN (GLUCOPHAGE-XR) 500 MG 24 hr tablet TAKE 1 TABLET BY MOUTH DAILY WITH SUPPER 90 tablet 3  . simvastatin (ZOCOR) 20 MG tablet Take 1 tablet (20 mg total) by mouth at bedtime. 90 tablet 1   Current Facility-Administered Medications on File Prior to Visit  Medication Dose Route Frequency Provider Last Rate Last Dose  . heparin lock flush 100 unit/mL  500 Units Intravenous Once Brunetta Genera, MD      . sodium chloride 0.9 % injection 10 mL  10 mL Intravenous PRN Brunetta Genera, MD        Allergies  Allergen Reactions  . Prochlorperazine Other (See Comments)    ARRHYTHMIAS  . Morphine And Related     Shuts bladder down.   Family History  Problem Relation Age of Onset  . Heart attack Father   . Alcoholism Father   . Obesity Father   . Asthma Sister   . Cancer Daughter        carcinoid tumor   PE: BP 118/70   Pulse 80   Ht _0  (1.727 m)   Wt 182 lb (82.6 kg)   SpO2 96%   BMI 27.67 kg/m  Wt Readings from Last 3 Encounters:  10/07/18 182 lb (82.6 kg)  09/25/18 182 lb (82.6 kg)  08/29/18 182 lb 12.8 oz (82.9 kg)   Constitutional: overweight, in NAD Eyes: PERRLA, EOMI, no exophthalmos ENT: moist mucous membranes, no thyromegaly, no cervical lymphadenopathy Cardiovascular: RRR, No MRG Respiratory: CTA B Gastrointestinal: abdomen soft, NT, ND, BS+ Musculoskeletal: no deformities, strength intact in all 4 Skin: moist, warm, no rashes Neurological: no tremor with outstretched hands, DTR normal in all 4  ASSESSMENT: 1. DM2, insulin-dependent, uncontrolled, without Long term complications, but with hypoglycemia  2. HL  3.  Overweight  PLAN:  1. Patient with longstanding, previously uncontrolled, type 2 diabetes, currently on low-dose metformin and long-acting insulin.  In the past he was on regular metformin that caused him  dizziness.  He tolerates well the metformin ER but is only taking 500 mg daily.  He was previously on Jardiance but we had to stop since this caused increased urination and UTI.  He tries to lift so he cannot stop to urinate frequently. -At this visit, reviewing his sugar logs (he brings good records), his sugars are mostly at goal  with few exceptions.  He noticed that grapes greatly increase his blood sugars.  We discussed about reducing these and moving them at the end of a meal, rather than in the middle of the morning or afternoon.  I do not feel he absolutely needs a change in the metformin dose, although he is open to increase the dose if absolutely needed in the future.  We will also continue the Basaglar at the current dose.  Patient Instructions  Please continue: - Metformin ER 500 mg with dinner  - Basaglar 14 units at bedtime  Please return in 4 months with your sugar log.   - we checked his HbA1c: 6.8% (stable) - advised to check sugars at different times of the day - 1x a day, rotating check times - advised for yearly eye exams >> he is UTD - return to clinic in 3-4 months      2. HL - Reviewed latest lipid panel from earlier this month, all fractions at goal Lab Results  Component Value Date   CHOL 134 09/25/2018   HDL 48.20 09/25/2018   LDLCALC 70 09/25/2018   LDLDIRECT 74.4 06/19/2006   TRIG 79.0 09/25/2018   CHOLHDL 3 09/25/2018  - Continues Zocor without side effects.  3.  Overweight -Gained 2 pounds since last visit -Unfortunately, we had to start Jardiance which was helping with weight loss, also -Continue metformin at low dose, which also has a mild appetite suppressant effect.   Philemon Kingdom, MD PhD [facility] Endocrinology

## 2018-10-07 NOTE — Patient Instructions (Addendum)
  Patient Instructions  Please continue: - Metformin ER 500 mg with dinner  - Basaglar 14 units at bedtime  Please return in 4 months with your sugar log.

## 2018-10-07 NOTE — Addendum Note (Signed)
Addended by: Cardell Peach I on: 10/07/2018 04:15 PM   Modules accepted: Orders

## 2018-12-30 ENCOUNTER — Other Ambulatory Visit: Payer: Self-pay

## 2018-12-30 ENCOUNTER — Encounter: Payer: Self-pay | Admitting: Family Medicine

## 2018-12-30 ENCOUNTER — Telehealth (INDEPENDENT_AMBULATORY_CARE_PROVIDER_SITE_OTHER): Payer: 59 | Admitting: Family Medicine

## 2018-12-30 VITALS — Ht 68.0 in

## 2018-12-30 DIAGNOSIS — R0602 Shortness of breath: Secondary | ICD-10-CM | POA: Diagnosis not present

## 2018-12-30 DIAGNOSIS — E11649 Type 2 diabetes mellitus with hypoglycemia without coma: Secondary | ICD-10-CM

## 2018-12-30 DIAGNOSIS — R42 Dizziness and giddiness: Secondary | ICD-10-CM | POA: Diagnosis not present

## 2018-12-30 DIAGNOSIS — J3489 Other specified disorders of nose and nasal sinuses: Secondary | ICD-10-CM | POA: Diagnosis not present

## 2018-12-30 DIAGNOSIS — Z794 Long term (current) use of insulin: Secondary | ICD-10-CM

## 2018-12-30 MED ORDER — IPRATROPIUM BROMIDE 0.06 % NA SOLN: 2.0000 | Freq: Four times a day (QID) | NASAL | 4 refills | Status: DC

## 2018-12-30 NOTE — Progress Notes (Signed)
Virtual Visit via Video Note   I connected with David Carr on 12/30/18 by a video enabled telemedicine application and verified that I am speaking with the correct person using two identifiers.  Location patient: home Location provider:work office Persons participating in the virtual visit: patient, provider  I discussed the limitations of evaluation and management by telemedicine and the availability of in person appointments. The patient expressed understanding and agreed to proceed.   HPI: David Carr is a 73 yo male with history of DM 2, AML, hypertension complaining about intermittent rhinorrhea, postnasal drainage, and nonproductive cough.  It happens mainly when he eats or with temp changes. He needs to clear his throat frequently.  If he wears his mask all day,keeping same tempeture around his nose he has no problems.  He has symptoms for the past few days, it has been intermittently for about 20 years. It seems to be worse around fall. He denies sick contact. Negative for headache, fatigue, body aches, fever, chills, sore throat, wheezing, abdominal pain, nausea, vomiting, changes in bowel habits, or urinary symptoms.  States that for the past 2 weeks, sometimes when he walks in the morning he feels "little" SOB but resolves after a few minutes of walking; he does not need to stop doing so. Denies associated chest pain, diaphoresis, or palpitations.  He has taken "Cough and chest congestion" medication. Antiacid tabs , hx of GERD in the past,not symptomatic at this time. Aspirin x 4 tab daily in case he has a "cold.   Since summer occasional "lightheadedness", he seems to be admitted when he increases fluid intake. DM2, BS 105-130.  Lab Results  Component Value Date   HGBA1C 6.8 (A) 10/07/2018    Negative for hypoglycemic events. He follows with endocrinologist.  ROS: See pertinent positives and negatives per HPI.  Past Medical History:  Diagnosis Date  .  AML (acute myeloid leukemia) in remission [facility]) oncologist-  dr Alvy Bimler-- per last note 10/ 2017 in remission 2 years   dx 09-11-2013  via bone marrow bx , FLT3 negative (NP M1 +)/  chemotherapy started 09-17-2013,  remission via marrow bx 11-06-2013,  4 cycles consolidation chemo w/ HiDAC 11-13-2013 to 03-05-2014  . BPH with urinary obstruction   . Chronic thrombocytopenic purpura ([place])   . Dental caries    periodontitis  . GERD (gastroesophageal reflux disease)   . History of kidney stones   . Hyperlipidemia   . Hypertension   . MVP (mitral valve prolapse)   . Pancytopenia, acquired ([place])   . 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   Complex valvuloplasty including triangular resection of flail segment of posterior leaflet, artificial Gore-tex neochord placement x6 and 72m Sorin Memo 3D ring annuloplasty via right mini thoracotomy approach  . Severe mitral regurgitation   . Type 2 diabetes mellitus with hypoglycemia, with long-term current use of insulin ([facility]    endocrinologist-  dr gCon Memos . Wears denture    upper    Past Surgical History:  Procedure Laterality Date  . CARDIAC CATHETERIZATION    . CARDIOVASCULAR STRESS TEST  07/23/2013   Low risk nuclear study w/ mild inferior ischemia/  normal LV function and wall motion, ef 69%  . CYSTOSCOPY WITH RETROGRADE PYELOGRAM, URETEROSCOPY AND STENT PLACEMENT Right 04/20/2016   Procedure: CYSTOSCOPY WITH RETROGRADE PYELOGRAM, URETEROSCOPY , STONE BASKETRY AND STENT PLACEMENT;  Surgeon: SFranchot Gallo MD;  [facility]  Service: Urology;  Laterality: Right;  . EXTRACORPOREAL SHOCK WAVE LITHOTRIPSY  yrs ago  . HOLMIUM LASER APPLICATION Right 07/15/5636   Procedure: HOLMIUM LASER APPLICATION;  Surgeon: Franchot Gallo, MD;  Location[facility];  Service: Urology;  Laterality: Right;  . INGUINAL HERNIA REPAIR Left 1990's  . MITRAL VALVE REPAIR N/A 06/21/2016   Procedure: MINIMALLY  INVASIVE MITRAL VALVE REPAIR (MVR) USING SORIN MEMO 3D SIZE 32 SEMIRIGID ANNULOPLASTY RING;  Surgeon: Rexene Alberts, MD;  Location: [place];  Service: Open Heart Surgery;  Laterality: N/A;  . MULTIPLE EXTRACTIONS WITH ALVEOLOPLASTY N/A 06/08/2016   Procedure: MULTIPLE EXTRACTION WITH ALVEOLOPLASTY AND PRE-PROSTHETIC SURGERY AS NEEDED;  Surgeon: Lenn Cal, DDS;  Location: [place];  Service: Oral Surgery;  Laterality: N/A;  . PATENT FORAMEN OVALE(PFO) CLOSURE N/A 06/21/2016   Procedure: PATENT FORAMEN OVALE (PFO) CLOSURE;  Surgeon: Rexene Alberts, MD;  Location: [place];  Service: Open Heart Surgery;  Laterality: N/A;  . RIGHT/LEFT HEART CATH AND CORONARY ANGIOGRAPHY N/A 06/07/2016   Procedure: Right/Left Heart Cath and Coronary Angiography;  Surgeon: Sherren Mocha, MD;  Location: [place] [facility];  Service: Cardiovascular;  Laterality: N/A;  . ROTATOR CUFF REPAIR Right [id number]  . TEE WITHOUT CARDIOVERSION N/A 01/11/2016   Procedure: TRANSESOPHAGEAL ECHOCARDIOGRAM (TEE);  Surgeon: Pixie Casino, MD;  [facility];  Service: Cardiovascular;  Laterality: N/A; Severe David associated w/ flail P2 segment of MV;  no LAA thromus;  small PFO by saline microbubble contrast after valsalva; LVEF 55-60%  . TEE WITHOUT CARDIOVERSION N/A 06/21/2016   Procedure: TRANSESOPHAGEAL ECHOCARDIOGRAM (TEE);  Surgeon: Rexene Alberts, MD;  Location: [place];  Service: Open Heart Surgery;  Laterality: N/A;  . TENNIS ELBOW RELEASE/NIRSCHEL PROCEDURE Right 1990's  . TRANSTHORACIC ECHOCARDIOGRAM  12/22/2015   grade 1 diastolic dysfunction,  ef 55-60%/  moderate MVP involving posterior leaflet w/ partial flail and severe David via CW doppler (peak gradient 83mHg)/  trivial TR    Family History  Problem Relation Age of Onset  . Heart attack Father   . Alcoholism Father   . Obesity Father   . Asthma Sister   . Cancer Daughter        carcinoid tumor    Social History   Socioeconomic History  . Marital  status: Married    Spouse name: Not on file  . Number of children: 2  . Years of education: college grad  . Highest education level: Not on file  Occupational History    Comment: retired, NA  Social Needs  . Financial resource strain: Not on file  . Food insecurity    Worry: Not on file    Inability: Not on file  . Transportation needs    Medical: Not on file    Non-medical: Not on file  Tobacco Use  . Smoking status: Former Smoker    Packs/day: 1.50    Years: 20.00    Pack years: 30.00    Types: Cigarettes    Quit date: 04/18/1974    Years since quitting: 44.7  . Smokeless tobacco: Never Used  Substance and Sexual Activity  . Alcohol use: No  . Drug use: No  . Sexual activity: Not Currently  Lifestyle  . Physical activity    Days per week: Not on file    Minutes per session: Not on file  . Stress: Not on file  Relationships  . Social [place] phone: Not on file    Gets together: Not on file    Attends religious service: Not  on file    Active member of club or organization: Not on file    Attends meetings of clubs or organizations: Not on file    Relationship status: Not on file  . Intimate partner violence    Fear of current or ex partner: Not on file    Emotionally abused: Not on file    Physically abused: Not on file    Forced sexual activity: Not on file  Other Topics Concern  . Not on file  Social History Narrative   Lives wife,  Son   Coffee, 1 cup  daily    Current Outpatient Medications:  .  acetaminophen (TYLENOL) 500 MG tablet, Take 2 tablets (1,000 mg total) by mouth every 6 (six) hours as needed., Disp: 30 tablet, Rfl: 0 .  aspirin 81 MG tablet, Take 81 mg by mouth as needed. , Disp: , Rfl:  .  atenolol (TENORMIN) 25 MG tablet, Take 0.5 tablets (12.5 mg total) by mouth daily., Disp: 90 tablet, Rfl: 2 .  glucose blood (ACCU-CHEK GUIDE) test strip, Use to check blood sugar 3 times a day, Disp: 300 each, Rfl: 12 .  Insulin Glargine  (BASAGLAR KWIKPEN) 100 UNIT/ML SOPN, Inject 0.14 mLs (14 Units total) into the skin at bedtime., Disp: 15 pen, Rfl: 7 .  Insulin Pen Needle (BD PEN NEEDLE NANO U/F) 32G X 4 MM MISC, USE once  A DAY, Disp: 100 each, Rfl: 3 .  ipratropium (ATROVENT) 0.06 % nasal spray, Place 2 sprays into both nostrils 4 (four) times daily., Disp: 15 mL, Rfl: 4 .  Lancets (ACCU-CHEK MULTICLIX) lancets, Use as instructed to check blood sugar 3 times a day., Disp: 300 each, Rfl: 12 .  metFORMIN (GLUCOPHAGE-XR) 500 MG 24 hr tablet, TAKE 1 TABLET BY MOUTH DAILY WITH SUPPER, Disp: 90 tablet, Rfl: 3 .  simvastatin (ZOCOR) 20 MG tablet, Take 1 tablet (20 mg total) by mouth at bedtime., Disp: 90 tablet, Rfl: 1 No current facility-administered medications for this visit.   Facility-Administered Medications Ordered in Other Visits:  .  heparin lock flush 100 unit/mL, 500 Units, Intravenous, Once, Kale, Cloria Spring, MD .  sodium chloride 0.9 % injection 10 mL, 10 mL, Intravenous, PRN, Irene Limbo, Cloria Spring, MD  EXAMTonette Carr per patient if applicable:Ht '5\' 8"'$  (1.727 m)   BMI 27.67 kg/m   GENERAL: alert, oriented, appears well and in no acute distress  HEENT: atraumatic, conjunctiva clear, no obvious abnormalities on inspection of external nose and ears  NECK: normal movements of the head and neck  LUNGS: on inspection no signs of respiratory distress, breathing rate appears normal, no obvious gross SOB, gasping or wheezing.  CV: no obvious cyanosis  MS: moves all visible extremities without noticeable abnormality  PSYCH/NEURO: pleasant and cooperative, no obvious depression or anxiety, speech and thought processing grossly intact  ASSESSMENT AND PLAN:  Discussed the following assessment and plan:  Rhinorrhea - Plan: ipratropium (ATROVENT) 0.06 % nasal spray History does not suggest a serious process or infectious illness. ?  Vasomotor rhinitis, allergic rhinitis. He agrees with trying Atrovent nasal  spray. Clearly instructed about warning signs.  Advise caution with Aspirin, some side effect discussed. Recommend not to take more than one Aspirin 81 mg  Lightheadedness Probably has been going on for a few months. We discussed possible causes, including dehydration as well as allergies among some. Fall precautions discussed. Instructed about warning signs.  Type 2 diabetes mellitus with hypoglycemia without coma, with long-term current use of  insulin ([place]) Problem seems to be well controlled. Continue monitoring BS. Per patient report, he has an appointment with his endocrinologist in 01/2019.   SOB (shortness of breath) Hx does not suggest serious process. Nasal congestion could cause perception of shortness of breath. I do not think imaging or further work-up is needed at this time, he agrees with plan. Clearly instructed about warning signs.  I discussed the assessment and treatment plan with the patient. He was provided an opportunity to ask questions and all were answered. He agreed with the plan and demonstrated an understanding of the instructions.   The patient was advised to call back or seek an in-person evaluation if the symptoms worsen or if the condition fails to improve as anticipated.  Return if symptoms worsen or fail to improve.    Betty Martinique, MD

## 2019-01-14 ENCOUNTER — Other Ambulatory Visit: Payer: Self-pay | Admitting: Family Medicine

## 2019-01-14 DIAGNOSIS — E7800 Pure hypercholesterolemia, unspecified: Secondary | ICD-10-CM

## 2019-02-11 ENCOUNTER — Ambulatory Visit: Payer: 59 | Admitting: Internal Medicine

## 2019-02-11 ENCOUNTER — Other Ambulatory Visit: Payer: Self-pay

## 2019-02-11 ENCOUNTER — Encounter: Payer: Self-pay | Admitting: Adult Health

## 2019-02-11 ENCOUNTER — Encounter: Payer: Self-pay | Admitting: Internal Medicine

## 2019-02-11 ENCOUNTER — Ambulatory Visit (INDEPENDENT_AMBULATORY_CARE_PROVIDER_SITE_OTHER): Payer: No Typology Code available for payment source | Admitting: Adult Health

## 2019-02-11 VITALS — BP 128/70 | HR 82 | Ht 68.0 in | Wt 185.0 lb

## 2019-02-11 VITALS — BP 122/70 | HR 119 | Temp 97.0°F | Ht 68.0 in | Wt 185.6 lb

## 2019-02-11 DIAGNOSIS — I10 Essential (primary) hypertension: Secondary | ICD-10-CM

## 2019-02-11 DIAGNOSIS — Z794 Long term (current) use of insulin: Secondary | ICD-10-CM

## 2019-02-11 DIAGNOSIS — I498 Other specified cardiac arrhythmias: Secondary | ICD-10-CM

## 2019-02-11 DIAGNOSIS — E663 Overweight: Secondary | ICD-10-CM

## 2019-02-11 DIAGNOSIS — E11649 Type 2 diabetes mellitus with hypoglycemia without coma: Secondary | ICD-10-CM | POA: Diagnosis not present

## 2019-02-11 DIAGNOSIS — E7800 Pure hypercholesterolemia, unspecified: Secondary | ICD-10-CM

## 2019-02-11 DIAGNOSIS — E785 Hyperlipidemia, unspecified: Secondary | ICD-10-CM

## 2019-02-11 DIAGNOSIS — Z79899 Other long term (current) drug therapy: Secondary | ICD-10-CM | POA: Diagnosis not present

## 2019-02-11 LAB — POCT GLYCOSYLATED HEMOGLOBIN (HGB A1C): Hemoglobin A1C: 6.7 % — AB (ref 4.0–5.6)

## 2019-02-11 MED ORDER — METFORMIN HCL ER 500 MG PO TB24: ORAL_TABLET | ORAL | 3 refills | Status: DC

## 2019-02-11 MED ORDER — ATENOLOL 25 MG PO TABS: 25.0000 mg | ORAL_TABLET | Freq: Every day | ORAL | 2 refills | Status: DC

## 2019-02-11 NOTE — Addendum Note (Signed)
Addended by: Cardell Peach I on: 02/11/2019 02:34 PM   Modules accepted: Orders

## 2019-02-11 NOTE — Progress Notes (Signed)
Patient ID: David Carr., male   DOB: Jun 19, 1945, 73 y.o.   MRN: [id number]   This visit occurred during the SARS-CoV-2 public health emergency.  Safety protocols were in place, including screening questions prior to the visit, additional usage of staff PPE, and extensive cleaning of exam room while observing appropriate contact time as indicated for disinfecting solutions.   HPI: David Carr. is a 73 y.o.-year-old male, returning for f/u for DM2, dx at the end of the 90s, insulin-dependent since ~2014, uncontrolled, without long term complications. Last visit 4 mo ago.  Last hemoglobin A1c was: Lab Results  Component Value Date   HGBA1C 6.8 (A) 10/07/2018   HGBA1C 6.8 (A) 05/09/2018   HGBA1C 6.7 (A) 01/08/2018   He is on: - Metformin ER [phone] mg with dinner >> 500 mg with dinner (he has dizziness and shortness of breath with higher doses) - Basaglar 14 units at bedtime He stopped Jardiance 10 mg b/c increased urination and dehydration (felt like being drunk). He was on 45 units of Humalog 75/25 at bedtime in the past.  Pt checks his sugars 2-3 times a day - forgot log: - am:  88-120, 131, 148 >> 90s-115 >> 116-140, 150 >> 90's, 102-136 - 2h after b'fast: 126, 128 >> n/c  >> 120, 181,  254 >> n/c - before lunch: 96-116, 139 >> 115-130 >> 98-125 >> 120-150 - 2h after lunch: 152 >> 193 >> n/c >> 117-181 >> n/c - before dinner:  140-220 >> 94-124, 174, 182 (grapes) >> ? - 2h after dinner: 170-180, occasionally >200 >> 120-192 >> ?, 220 - bedtime: 125 >> n/c >> 144-168, 223 >> see above - nighttime: n/c Lowest sugar was: 70s x2 >> 92 >> 90s.  He has hypoglycemia awareness in the 80s. He had an episode of loss of consciousness when sugars reached 60 in 07/17/2016.  Highest sugar was 200s >> 254 (dehydration) >> 220.  Glucometer: Freestyle Lite  Pt's meals are: - Breakfast: coffee + nab or slice pound cake + pop tarts - Lunch: fast food or sandwich or skips -  Dinner: meat + veggies  - Snacks: 1, before bedtime  No regular sodas. He continues to walk twice a day.  -No CKD, last BUN/creatinine:  Lab Results  Component Value Date   BUN 14 09/25/2018   BUN 14 08/30/2017   CREATININE 0.97 09/25/2018   CREATININE 1.00 08/30/2017   -+ HL; last set of lipids: Lab Results  Component Value Date   CHOL 134 09/25/2018   HDL 48.20 09/25/2018   LDLCALC 70 09/25/2018   LDLDIRECT 74.4 06/19/2006   TRIG 79.0 09/25/2018   CHOLHDL 3 09/25/2018  On Zocor - last eye exam was in 02/2018: No DR -No numbness and tingling in his feet.  He has AML in remission.  He was discharged by Dr. Alvy Bimler from the oncology clinic due to persistent remission.  ROS: Constitutional: no weight gain/no weight loss, no fatigue, no subjective hyperthermia, no subjective hypothermia Eyes: no blurry vision, no xerophthalmia ENT: no sore throat, no nodules palpated in neck, no dysphagia, no odynophagia, no hoarseness Cardiovascular: no CP/no SOB/no palpitations/no leg swelling Respiratory: no cough/no SOB/no wheezing Gastrointestinal: no N/no V/no D/no C/no acid reflux Musculoskeletal: no muscle aches/no joint aches Skin: no rashes, no hair loss Neurological: no tremors/no numbness/no tingling/no dizziness  I reviewed pt's medications, allergies, PMH, social hx, family hx, and changes were documented in the history of present illness. Otherwise, unchanged from my  initial visit note.  Past Medical History:  Diagnosis Date  . AML (acute myeloid leukemia) in remission [facility]) oncologist-  dr Alvy Bimler-- per last note 10/ 2017 in remission 2 years   dx 09-11-2013  via bone marrow bx , FLT3 negative (NP M1 +)/  chemotherapy started 09-17-2013,  remission via marrow bx 11-06-2013,  4 cycles consolidation chemo w/ HiDAC 11-13-2013 to 03-05-2014  . BPH with urinary obstruction   . Chronic thrombocytopenic purpura ([place])   . Dental caries    periodontitis  . GERD (gastroesophageal  reflux disease)   . History of kidney stones   . Hyperlipidemia   . Hypertension   . MVP (mitral valve prolapse)   . Pancytopenia, acquired ([place])   . 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 Severe mitral regurgitation   . Type 2 diabetes mellitus with hypoglycemia, with long-term current use of insulin ([facility]    endocrinologist-  dr gCon Memos . Wears denture    upper   Past Surgical History:  Procedure Laterality Date  . CARDIAC CATHETERIZATION    . CARDIOVASCULAR STRESS TEST  07/23/2013   Low risk nuclear study w/ mild inferior ischemia/  normal LV function and wall motion, ef 69%  . CYSTOSCOPY WITH RETROGRADE PYELOGRAM, URETEROSCOPY AND STENT PLACEMENT Right 04/20/2016   Procedure: CYSTOSCOPY WITH RETROGRADE PYELOGRAM, URETEROSCOPY , STONE BASKETRY AND STENT PLACEMENT;  Surgeon: SFranchot Gallo MD;  [facility]  Service: Urology;  Laterality: Right;  . EXTRACORPOREAL SHOCK WAVE LITHOTRIPSY  yrs ago  . HOLMIUM LASER APPLICATION Right 31/08/4079  Procedure: HOLMIUM LASER APPLICATION;  Surgeon: SFranchot Gallo MD;  [facility]  Service: Urology;  Laterality: Right;  . INGUINAL HERNIA REPAIR Left 1990's  . MITRAL VALVE REPAIR N/A 06/21/2016   Procedure: MINIMALLY INVASIVE MITRAL VALVE REPAIR (MVR) USING SORIN MEMO 3D SIZE 32 SEMIRIGID ANNULOPLASTY RING;  Surgeon: ORexene Alberts MD;  [facility]  Service: Open Heart Surgery;  Laterality: N/A;  . MULTIPLE EXTRACTIONS WITH ALVEOLOPLASTY N/A 06/08/2016   Procedure: MULTIPLE EXTRACTION WITH ALVEOLOPLASTY AND PRE-PROSTHETIC SURGERY AS NEEDED;  Surgeon: RLenn Cal DDS;  [place] [facility]  Service: Oral Surgery;  Laterality: N/A;  . PATENT FORAMEN OVALE(PFO)  CLOSURE N/A 06/21/2016   Procedure: PATENT FORAMEN OVALE (PFO) CLOSURE;  Surgeon: ORexene Alberts MD;  [place]  Service: Open Heart Surgery;  Laterality: N/A;  . RIGHT/LEFT HEART CATH AND CORONARY ANGIOGRAPHY N/A 06/07/2016   Procedure: Right/Left Heart Cath and Coronary Angiography;  Surgeon: MSherren Mocha MD;  [facility];  Service: Cardiovascular;  Laterality: N/A;  . ROTATOR CUFF REPAIR Right [id number] . TEE WITHOUT CARDIOVERSION N/A 01/11/2016   Procedure: TRANSESOPHAGEAL ECHOCARDIOGRAM (TEE);  Surgeon: KPixie Casino MD;  [facility];  Service: Cardiovascular;  Laterality: N/A; Severe MR associated w/ flail P2 segment of MV;  no LAA thromus;  small PFO by saline microbubble contrast after valsalva; LVEF 55-60%  . TEE WITHOUT CARDIOVERSION N/A 06/21/2016   Procedure: TRANSESOPHAGEAL ECHOCARDIOGRAM (TEE);  Surgeon: ORexene Alberts MD;  [place] [facility]  Service: Open Heart Surgery;  Laterality: N/A;  . TENNIS ELBOW RELEASE/NIRSCHEL PROCEDURE Right 1990's  . TRANSTHORACIC ECHOCARDIOGRAM  12/22/2015   grade 1 diastolic dysfunction,  ef 55-60%/  moderate MVP involving posterior  leaflet w/ partial flail and severe MR via CW doppler (peak gradient 79mHg)/  trivial TR   Social History   Social History  . Marital status: Married    Spouse name: N/A  . Number of children: 2   Occupational History  . accountant   Social History Main Topics  . Smoking status: Former SResearch scientist (life sciences) . Smokeless tobacco: Never Used  . Alcohol use No  . Drug use: No   Current Outpatient Medications on File Prior to Visit  Medication Sig Dispense Refill  . acetaminophen (TYLENOL) 500 MG tablet Take 2 tablets (1,000 mg total) by mouth every 6 (six) hours as needed. 30 tablet 0  . aspirin 81 MG tablet Take 81 mg by mouth as needed.     .Marland Kitchenatenolol (TENORMIN) 25 MG tablet Take 0.5 tablets (12.5 mg total) by mouth daily. 90 tablet 2  . glucose blood (ACCU-CHEK GUIDE) test strip Use  to check blood sugar 3 times a day 300 each 12  . Insulin Glargine (BASAGLAR KWIKPEN) 100 UNIT/ML SOPN Inject 0.14 mLs (14 Units total) into the skin at bedtime. 15 pen 7  . Insulin Pen Needle (BD PEN NEEDLE NANO U/F) 32G X 4 MM MISC USE once  A DAY 100 each 3  . ipratropium (ATROVENT) 0.06 % nasal spray Place 2 sprays into both nostrils 4 (four) times daily. 15 mL 4  . Lancets (ACCU-CHEK MULTICLIX) lancets Use as instructed to check blood sugar 3 times a day. 300 each 12  . metFORMIN (GLUCOPHAGE-XR) 500 MG 24 hr tablet TAKE 1 TABLET BY MOUTH DAILY WITH SUPPER 90 tablet 3  . simvastatin (ZOCOR) 20 MG tablet TAKE 1 TABLET BY MOUTH EVERYDAY AT BEDTIME 90 tablet 1   Current Facility-Administered Medications on File Prior to Visit  Medication Dose Route Frequency Provider Last Rate Last Admin  . heparin lock flush 100 unit/mL  500 Units Intravenous Once KBrunetta Genera MD      . sodium chloride 0.9 % injection 10 mL  10 mL Intravenous PRN KBrunetta Genera MD        Allergies  Allergen Reactions  . Prochlorperazine Other (See Comments)    ARRHYTHMIAS  . Morphine And Related     Shuts bladder down.   Family History  Problem Relation Age of Onset  . Heart attack Father   . Alcoholism Father   . Obesity Father   . Asthma Sister   . Cancer Daughter        carcinoid tumor   PE: BP 128/70   Pulse 82   Ht _0  (1.727 m)   Wt 185 lb (83.9 kg)   SpO2 95%   BMI 28.13 kg/m  Wt Readings from Last 3 Encounters:  02/11/19 185 lb (83.9 kg)  10/07/18 182 lb (82.6 kg)  09/25/18 182 lb (82.6 kg)   Constitutional: overweight, in NAD Eyes: PERRLA, EOMI, no exophthalmos ENT: moist mucous membranes, no thyromegaly, no cervical lymphadenopathy Cardiovascular: + Irregularly irregular rhythm, RR, No MRG Respiratory: CTA B Gastrointestinal: abdomen soft, NT, ND, BS+ Musculoskeletal: no deformities, strength intact in all 4 Skin: moist, warm, no rashes Neurological: no tremor with  outstretched hands, DTR normal in all 4  ASSESSMENT: 1. DM2, insulin-dependent, uncontrolled, without Long term complications, but with hypoglycemia  2. HL  3.  Overweight  4. Irregularly irregular heart rhythm  PLAN:  1. Patient with longstanding, previously uncontrolled, type 2 diabetes, currently on low-dose Metformin and long-acting insulin.  He was on a  high dose of Metformin in the past that caused him dizziness.  He tolerates well the Metformin ER but only 500 mg daily.  He was previously on Jardiance, but we had to stop since this caused increased urination and UTI. -At last visit, sugars were mostly at goal with few exceptions, per review of his sugar logs.  We discussed about eating fruits with high glycemic index at the end of the meal, rather than between meals.  We did not change his regimen then.  An HbA1c was excellent, 6.8% - at this visit, his sugars are still at goal the majority of the time. However, he forgot his log at home and he does remember some sugars that are higher than target but does not remember how frequently they occur. At today visit, his HbA1c: 6.7% (slightly better) -Therefore, we will continue his current regimen, which he tolerates well. Patient Instructions  Please continue: - Metformin ER 500 mg with dinner - Basaglar 14 units at bedtime  Please return in 4 months with your sugar log.   - advised to check sugars at different times of the day - 1x a day, rotating check times - advised for yearly eye exams >> he is UTD - return to clinic in 4 months      2. HL -Reviewed latest lipid panel from 09/2018: All fractions at goal Lab Results  Component Value Date   CHOL 134 09/25/2018   HDL 48.20 09/25/2018   LDLCALC 70 09/25/2018   LDLDIRECT 74.4 06/19/2006   TRIG 79.0 09/25/2018   CHOLHDL 3 09/25/2018  -Continue Zocor without side effects  3.  Overweight -unfortunately, we had to stop Jardiance, which was helping with weight loss, also -We  will continue Metformin which has mild appetite suppressant effects  4. Irregularly irregular heart rhythm -At today's visit, on auscultation, he appears to be in atrial fibrillation without RVR. Upon questioning, he does not have a history of this. -Unfortunately, I could not check an EKG today since our EKG device is not functioning -I asked the patient to go directly to his cardiologist and see if he can have an EKG done. He opted to go to his cardiac surgeon first (who is in the same building).   Philemon Kingdom, MD PhD [facility]

## 2019-02-11 NOTE — Patient Instructions (Signed)
Medication Instructions:  INCREASE- Atenolol 25 mg by mouth daily  *If you need a refill on your cardiac medications before your next appointment, please call your pharmacy*  Lab Work: CBC, TSH, Magnesium and BMP Today  If you have labs (blood work) drawn today and your tests are completely normal, you will receive your results only by: Marland Kitchen MyChart Message (if you have MyChart) OR . A paper copy in the mail If you have any lab test that is abnormal or we need to change your treatment, we will call you to review the results.  Testing/Procedures: None Ordered  Follow-Up: At [facility], you and your health needs are our priority.  As part of our continuing mission to provide you with exceptional heart care, we have created designated Provider Care Teams.  These Care Teams include your primary Cardiologist (physician) and Advanced Practice Providers (APPs -  Physician Assistants and Nurse Practitioners) who all work together to provide you with the care you need, when you need it.  Your next appointment:   1 month(s)  The format for your next appointment:   In Person  Provider:   Minus Breeding, MD or Jory Sims, DNP

## 2019-02-11 NOTE — Progress Notes (Signed)
Cardiology Office Note   Date:  02/11/2019   ID:  David P Palmatier Jr., DOB 01/08/1946, MRN [id number]  PCP:  Jordan, Betty G, MD  Cardiologist:  Dr.Hochrein   CC: Irregular HR   History of Present Illness: David P Dizon Jr. is a 73 y.o. male who presents as add on today at the request of his endocrinologist  after office visit today with finding of irregular HR on auscultation, but did not have ability  to have EKG completed in the office.  He has a history of hypertension, mitral regurgitation, s/p MV repair, dyslipidemia, and diabetes.   He has a history of CAD with most recent cardiac cath in 2018, with proximal LAD, 40%, distal LAD 50%, 1st diagonal branch ostial 50% stenosis, 1 st OM of Cx 40% stenosed. RCA without significant stenosis.   He is completely asymptomatic with no chest pain, dyspnea pr fatigue. He walks his dog twice a day anywhere from 1/2 to 3/4 a mile each time.  He cannot feel arrhyhtmia . He is on atenolol 12.5 mg daily.   Past Medical History:  Diagnosis Date  . AML (acute myeloid leukemia) in remission ([facility]) oncologist-  dr gorsuch-- per last note 10/ 2017 in remission 2 years   dx 09-11-2013  via bone marrow bx , FLT3 negative (NP M1 +)/  chemotherapy started 09-17-2013,  remission via marrow bx 11-06-2013,  4 cycles consolidation chemo w/ HiDAC 11-13-2013 to 03-05-2014  . BPH with urinary obstruction   . Chronic thrombocytopenic purpura ([facility])   . Dental caries    periodontitis  . GERD (gastroesophageal reflux disease)   . History of kidney stones   . Hyperlipidemia   . Hypertension   . MVP (mitral valve prolapse)   . Pancytopenia, acquired ([facility])   . 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   Complex valvuloplasty including triangular resection of flail segment of posterior leaflet, artificial Gore-tex neochord placement x6 and 32mm Sorin Memo 3D ring annuloplasty via right mini thoracotomy approach  . Severe mitral regurgitation   . Type  2 diabetes mellitus with hypoglycemia, with long-term current use of insulin ([facility])    endocrinologist-  dr ghergge  . Wears denture    upper    Past Surgical History:  Procedure Laterality Date  . CARDIAC CATHETERIZATION    . CARDIOVASCULAR STRESS TEST  07/23/2013   Low risk nuclear study w/ mild inferior ischemia/  normal LV function and wall motion, ef 69%  . CYSTOSCOPY WITH RETROGRADE PYELOGRAM, URETEROSCOPY AND STENT PLACEMENT Right 04/20/2016   Procedure: CYSTOSCOPY WITH RETROGRADE PYELOGRAM, URETEROSCOPY , STONE BASKETRY AND STENT PLACEMENT;  Surgeon: Stephen Dahlstedt, MD;  Location: [facility];  Service: Urology;  Laterality: Right;  . EXTRACORPOREAL SHOCK WAVE LITHOTRIPSY  yrs ago  . HOLMIUM LASER APPLICATION Right 04/20/2016   Procedure: HOLMIUM LASER APPLICATION;  Surgeon: Stephen Dahlstedt, MD;  Location: [facility];  Service: Urology;  Laterality: Right;  . INGUINAL HERNIA REPAIR Left 1990's  . MITRAL VALVE REPAIR N/A 06/21/2016   Procedure: MINIMALLY INVASIVE MITRAL VALVE REPAIR (MVR) USING SORIN MEMO 3D SIZE 32 SEMIRIGID ANNULOPLASTY RING;  Surgeon: Owen, Clarence H, MD;  [facility];  Service: Open Heart Surgery;  Laterality: N/A;  . MULTIPLE EXTRACTIONS WITH ALVEOLOPLASTY N/A 06/08/2016   Procedure: MULTIPLE EXTRACTION WITH ALVEOLOPLASTY AND PRE-PROSTHETIC SURGERY AS NEEDED;  Surgeon: Ronald F Kulinski, DDS;  [facility];  Service: Oral Surgery;  Laterality: N/A;  . PATENT FORAMEN OVALE(PFO) CLOSURE N/A   06/21/2016   Procedure: PATENT FORAMEN OVALE (PFO) CLOSURE;  Surgeon: Owen, Clarence H, MD;  [facility];  Service: Open Heart Surgery;  Laterality: N/A;  . RIGHT/LEFT HEART CATH AND CORONARY ANGIOGRAPHY N/A 06/07/2016   Procedure: Right/Left Heart Cath and Coronary Angiography;  Surgeon: Michael Cooper, MD;  [facility];  Service: Cardiovascular;  Laterality: N/A;  . ROTATOR CUFF REPAIR Right 2010,2011  . TEE WITHOUT  CARDIOVERSION N/A 01/11/2016   Procedure: TRANSESOPHAGEAL ECHOCARDIOGRAM (TEE);  Surgeon: Kenneth C Hilty, MD;  [facility];  Service: Cardiovascular;  Laterality: N/A; Severe MR associated w/ flail P2 segment of MV;  no LAA thromus;  small PFO by saline microbubble contrast after valsalva; LVEF 55-60%  . TEE WITHOUT CARDIOVERSION N/A 06/21/2016   Procedure: TRANSESOPHAGEAL ECHOCARDIOGRAM (TEE);  Surgeon: Owen, Clarence H, MD;  [facility];  Service: Open Heart Surgery;  Laterality: N/A;  . TENNIS ELBOW RELEASE/NIRSCHEL PROCEDURE Right 1990's  . TRANSTHORACIC ECHOCARDIOGRAM  12/22/2015   grade 1 diastolic dysfunction,  ef 55-60%/  moderate MVP involving posterior leaflet w/ partial flail and severe MR via CW doppler (peak gradient 3mmHg)/  trivial TR     Current Outpatient Medications  Medication Sig Dispense Refill  . acetaminophen (TYLENOL) 500 MG tablet Take 2 tablets (1,000 mg total) by mouth every 6 (six) hours as needed. 30 tablet 0  . aspirin 81 MG tablet Take 81 mg by mouth as needed.     . atenolol (TENORMIN) 25 MG tablet Take 0.5 tablets (12.5 mg total) by mouth daily. 90 tablet 2  . glucose blood (ACCU-CHEK GUIDE) test strip Use to check blood sugar 3 times a day 300 each 12  . Insulin Glargine (BASAGLAR KWIKPEN) 100 UNIT/ML SOPN Inject 0.14 mLs (14 Units total) into the skin at bedtime. 15 pen 7  . Insulin Pen Needle (BD PEN NEEDLE NANO U/F) 32G X 4 MM MISC USE once  A DAY 100 each 3  . ipratropium (ATROVENT) 0.06 % nasal spray Place 2 sprays into both nostrils 4 (four) times daily. 15 mL 4  . Lancets (ACCU-CHEK MULTICLIX) lancets Use as instructed to check blood sugar 3 times a day. 300 each 12  . metFORMIN (GLUCOPHAGE-XR) 500 MG 24 hr tablet TAKE 1 TABLET BY MOUTH DAILY WITH SUPPER 90 tablet 3  . simvastatin (ZOCOR) 20 MG tablet TAKE 1 TABLET BY MOUTH EVERYDAY AT BEDTIME 90 tablet 1   No current facility-administered medications for this visit.    Facility-Administered Medications Ordered in Other Visits  Medication Dose Route Frequency Provider Last Rate Last Admin  . heparin lock flush 100 unit/mL  500 Units Intravenous Once Kale, Gautam Kishore, MD      . sodium chloride 0.9 % injection 10 mL  10 mL Intravenous PRN Kale, Gautam Kishore, MD        Allergies:   Prochlorperazine and Morphine and related    Social History:  The patient  reports that he quit smoking about 44 years ago. His smoking use included cigarettes. He has a 30.00 pack-year smoking history. He has never used smokeless tobacco. He reports that he does not drink alcohol or use drugs.   Family History:  The patient's family history includes Alcoholism in his father; Asthma in his sister; Cancer in his daughter; Heart attack in his father; Obesity in his father.    ROS: All other systems are reviewed and negative. Unless otherwise mentioned in H&P    PHYSICAL EXAM: VS:  Ht 5' 8" (  1.727 m)   Wt 185 lb 9.6 oz (84.2 kg)   BMI 28.22 kg/m  , BMI Body mass index is 28.22 kg/m. GEN: Well nourished, well developed, in no acute distress HEENT: normal Neck: no JVD, carotid bruits, or masses Cardiac: IRRR; no murmurs, rubs, or gallops,no edema  Respiratory:  Clear to auscultation bilaterally, normal work of breathing GI: soft, nontender, nondistended, + BS MS: no deformity or atrophy Skin: warm and dry, no rash Neuro:  Strength and sensation are intact Psych: euthymic mood, full affect   EKG:  SR with frequent PVC in couplets, in bigeminy pattern. 119 bpm.   Recent Labs: 08/29/2018: Hemoglobin 17.0; Platelets 122 09/25/2018: ALT 17; BUN 14; Creatinine, Ser 0.97; Potassium 4.7; Sodium 139    Lipid Panel    Component Value Date/Time   CHOL 134 09/25/2018 0838   TRIG 79.0 09/25/2018 0838   HDL 48.20 09/25/2018 0838   CHOLHDL 3 09/25/2018 0838   VLDL 15.8 09/25/2018 0838   LDLCALC 70 09/25/2018 0838   LDLDIRECT 74.4 06/19/2006 0956      Wt Readings  from Last 3 Encounters:  02/11/19 185 lb 9.6 oz (84.2 kg)  02/11/19 185 lb (83.9 kg)  10/07/18 182 lb (82.6 kg)      Other studies Reviewed: 1. The left ventricle has normal systolic function with an ejection fraction of 60-65%. The cavity size was normal. There is mildly increased left ventricular wall thickness. Left ventricular diastolic function could not be evaluated due to mitral valve  replacement/repair.  2. The right ventricle has normal systolic function. The cavity was normal. There is no increase in right ventricular wall thickness.  3. The tricuspid valve is grossly normal.  4. The aortic valve is tricuspid Mild calcification of the aortic valve.  5. Normal LV function; mild LVH; s/p MV repair with mean gradient 4 mmHg and no MR.  LHC 08/07/2016  Left Main  Vessel is angiographically normal.  Left Anterior Descending  There is mild the vessel.  Prox LAD lesion 40% stenosed  .  Dist LAD lesion 50% stenosed  apical lesion, small caliber vessel  First Diagonal Branch  Ost 1st Diag to 1st Diag lesion 50% stenosed  .  Left Circumflex  First Obtuse Marginal Branch  1st Mrg lesion 40% stenosed  .  Right Coronary Artery  There is mild the vessel. The vessel is mildly ectatic. The RCA is dominant with diffuse irregularity but no significant stenosis. There is mild ectasia present in the distal RCA.      ASSESSMENT AND PLAN:  1.Frequent PVC's: Irregular HR noted by Endocrinologist, who sent him to see [place] as a walk in for EKG.  He is having PVC's in couplets in a bigeminy pattern.  He is asymptomatic.  Will check some labs to include Mg, BMET, and TSH.  Will increase atenolol to 25 mg daily from 12.5 mg daily. Can consider cardiac monitor on follow up is evaluate PVC burden. With his of MVR he may be having some conduction abnormalities. Do not want to suppress his HR to much.   2. CAD: Hx of 3 vessel CAD per cath in 2018. He is continued on ASA 81 mg, and statin therapy  for secondary prevention. No plans for ischemic work up unless he is asymptomatic.  I have discussed this with Dr. Debara Pickett who is DOD today concerning need to have further work up. He recommends medical management at this time.   3.Diabetes: Followed by Endocrinology.   4. Hypercholesterolemia:  On simvastatin 20 mg daily. Goal of LDL < 70.   Current medicines are reviewed at length with the patient today.    Labs/ tests ordered today include: Mg, BMET, TSH.   Phill Myron. West Pugh, ANP, AACC   02/11/2019 3:09 PM    [address] Office [phone] Fax [phone]  Notice: This dictation was prepared with Dragon dictation along with smaller phrase technology. Any transcriptional errors that result from this process are unintentional and may not be corrected upon review.

## 2019-02-11 NOTE — Patient Instructions (Signed)
Please continue: - Metformin ER 500 mg with dinner - Basaglar 14 units at bedtime  Please return in 4 months with your sugar log.

## 2019-02-12 LAB — BASIC METABOLIC PANEL
BUN/Creatinine Ratio: 18 (ref 10–24)
BUN: 20 mg/dL (ref 8–27)
CO2: 22 mmol/L (ref 20–29)
Calcium: 10.4 mg/dL — ABNORMAL HIGH (ref 8.6–10.2)
Chloride: 102 mmol/L (ref 96–106)
Creatinine, Ser: 1.14 mg/dL (ref 0.76–1.27)
GFR calc Af Amer: 72 mL/min/{1.73_m2} (ref >59)
GFR calc non Af Amer: 63 mL/min/{1.73_m2} (ref >59)
Glucose: 125 mg/dL — ABNORMAL HIGH (ref 65–99)
Potassium: 5.1 mmol/L (ref 3.5–5.2)
Sodium: 140 mmol/L (ref 134–144)

## 2019-02-12 LAB — CBC
Hematocrit: 52.6 % — ABNORMAL HIGH (ref 37.5–51.0)
Hemoglobin: 18.6 g/dL — ABNORMAL HIGH (ref 13.0–17.7)
MCH: 32.3 pg (ref 26.6–33.0)
MCHC: 35.4 g/dL (ref 31.5–35.7)
MCV: 91 fL (ref 79–97)
Platelets: 134 10*3/uL — ABNORMAL LOW (ref 150–450)
RBC: 5.76 x10E6/uL (ref 4.14–5.80)
RDW: 13.1 % (ref 11.6–15.4)
WBC: 6.3 10*3/uL (ref 3.4–10.8)

## 2019-02-12 LAB — TSH: TSH: 4.5 u[IU]/mL (ref 0.450–4.500)

## 2019-02-12 LAB — MAGNESIUM: Magnesium: 2.3 mg/dL (ref 1.6–2.3)

## 2019-02-13 ENCOUNTER — Telehealth: Payer: Self-pay | Admitting: Cardiology

## 2019-02-13 DIAGNOSIS — I519 Heart disease, unspecified: Secondary | ICD-10-CM

## 2019-02-13 DIAGNOSIS — I493 Ventricular premature depolarization: Secondary | ICD-10-CM

## 2019-02-13 NOTE — Telephone Encounter (Signed)
Patient is returning phone call regarding Echo results.

## 2019-02-13 NOTE — Telephone Encounter (Signed)
Spoke with pt who states he is calling for results of recent lab work. Pt had CBC, Mg, TSH, BMP. Reviewed the following result note with pt:  Lendon Colonel, NP  02/12/2019 7:41 AM EST    Labs are reviewed Values are essentially normal. Mild reduction in platelets. Will need to follow up with PCP. Mg and TSH are normal. Continuee on higher dose of atenolol at 25 mg daily. Will need to have echo completed for changes in the LV function with increased ventricular ectopy. Please order. He is to avoid caffeine.      Patient verbalized understanding. Texted pt activation code for MyChart with confirmation of receipt of text. Echo order in Epic

## 2019-02-17 NOTE — Telephone Encounter (Signed)
Follow up     Called patient to schedule echo.  He said Dr Percival Spanish said back in feb/march that he only had to do an echo every 48yrs.  Patient said his symptoms are very rare and he did not think he needed another echo.  I told patient I would let nurse know and they may call him back to discuss echo.  Echo not scheduled.

## 2019-02-17 NOTE — Telephone Encounter (Signed)
Returned the call to the patient. He stated that since the medication was increased (Atenolol), he was feeling better and would rather not have an echo at this time. He was under the impression that he could have one every 2 years.

## 2019-02-17 NOTE — Telephone Encounter (Signed)
OK to hold off doing the echo this year.  Need to put in for March of next year.

## 2019-02-18 NOTE — Telephone Encounter (Signed)
Left a message for the patient to call back.  

## 2019-02-18 NOTE — Telephone Encounter (Signed)
NOTED RECALL ENTERED .Adonis Housekeeper

## 2019-02-18 NOTE — Telephone Encounter (Signed)
Patient returning call. He states he does not want to be called back and does not want an echo until March of next year.

## 2019-03-12 ENCOUNTER — Ambulatory Visit: Payer: No Typology Code available for payment source

## 2019-03-13 DIAGNOSIS — I2510 Atherosclerotic heart disease of native coronary artery without angina pectoris: Secondary | ICD-10-CM | POA: Insufficient documentation

## 2019-03-13 DIAGNOSIS — Z7189 Other specified counseling: Secondary | ICD-10-CM | POA: Insufficient documentation

## 2019-03-13 NOTE — Progress Notes (Signed)
Cardiology Office Note   Date:  03/14/2019   ID:  David Bones Harjas Biggins., DOB 08/13/1945, MRN [id number]  PCP:  Martin[place], Betty G, MD  Cardiologist:   No primary care provider on file.   Chief Complaint  Patient presents with  . PVCS      History of Present Illness: Zacherie Honeyman. is a 74 y.o. male who presents for followup of MR.  He is status post minimally invasive MV repair with ring annuloplasty and placement of Neo Chord on 06/30/16.  He also had a PFO closed.   He was added on to the schedule last month with irregular heartbeat and had frequent PVCs.  He was not having symptoms.   He had his beta-blocker increased and he did well.  He has not really noticed the palpitations except infrequently.  He denies any shortness of breath, PND or orthopnea.  He said no presyncope or syncope.  He has had no chest discomfort.     Past Medical History:  Diagnosis Date  . AML (acute myeloid leukemia) in remission [facility]) oncologist-  dr Alvy Bimler-- per last note 10/ 2017 in remission 2 years   dx 09-11-2013  via bone marrow bx , FLT3 negative (NP M1 +)/  chemotherapy started 09-17-2013,  remission via marrow bx 11-06-2013,  4 cycles consolidation chemo w/ HiDAC 11-13-2013 to 03-05-2014  . BPH with urinary obstruction   . Chronic thrombocytopenic purpura ([place])   . Dental caries    periodontitis  . GERD (gastroesophageal reflux disease)   . History of kidney stones   . Hyperlipidemia   . Hypertension   . MVP (mitral valve prolapse)   . Pancytopenia, acquired ([place])   . 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   Complex valvuloplasty including triangular resection of flail segment of posterior leaflet, artificial Gore-tex neochord placement x6 and 72m Sorin Memo 3D ring annuloplasty via right mini thoracotomy approach  . Severe mitral regurgitation   . Type 2 diabetes mellitus with hypoglycemia, with long-term current use of insulin ([facility]    endocrinologist-  dr gCon Memos   . Wears denture    upper    Past Surgical History:  Procedure Laterality Date  . CARDIAC CATHETERIZATION    . CARDIOVASCULAR STRESS TEST  07/23/2013   Low risk nuclear study w/ mild inferior ischemia/  normal LV function and wall motion, ef 69%  . CYSTOSCOPY WITH RETROGRADE PYELOGRAM, URETEROSCOPY AND STENT PLACEMENT Right 04/20/2016   Procedure: CYSTOSCOPY WITH RETROGRADE PYELOGRAM, URETEROSCOPY , STONE BASKETRY AND STENT PLACEMENT;  Surgeon: SFranchot Gallo MD;  [facility]  Service: Urology;  Laterality: Right;  . EXTRACORPOREAL SHOCK WAVE LITHOTRIPSY  yrs ago  . HOLMIUM LASER APPLICATION Right 32/02/9756  Procedure: HOLMIUM LASER APPLICATION;  Surgeon: SFranchot Gallo MD;  [facility]  Service: Urology;  Laterality: Right;  . INGUINAL HERNIA REPAIR Left 1990's  . MITRAL VALVE REPAIR N/A 06/21/2016   Procedure: MINIMALLY INVASIVE MITRAL VALVE REPAIR (MVR) USING SORIN MEMO 3D SIZE 32 SEMIRIGID ANNULOPLASTY RING;  Surgeon: ORexene Alberts MD;  [place]  Service: Open Heart Surgery;  Laterality: N/A;  . MULTIPLE EXTRACTIONS WITH ALVEOLOPLASTY N/A 06/08/2016   Procedure: MULTIPLE EXTRACTION WITH ALVEOLOPLASTY AND PRE-PROSTHETIC SURGERY AS NEEDED;  Surgeon: RLenn Cal DDS;  [place]  Service: Oral Surgery;  Laterality: N/A;  . PATENT FORAMEN OVALE(PFO) CLOSURE N/A 06/21/2016   Procedure: PATENT FORAMEN OVALE (PFO) CLOSURE;  Surgeon: ORoxy Manns  Valentina Gu, MD;  Location: [place];  Service: Open Heart Surgery;  Laterality: N/A;  . RIGHT/LEFT HEART CATH AND CORONARY ANGIOGRAPHY N/A 06/07/2016   Procedure: Right/Left Heart Cath and Coronary Angiography;  Surgeon: Sherren Mocha, MD;  Location: [place] CV LAB;  Service: Cardiovascular;  Laterality: N/A;  . ROTATOR CUFF REPAIR Right [id number]  . TEE WITHOUT CARDIOVERSION N/A 01/11/2016   Procedure: TRANSESOPHAGEAL ECHOCARDIOGRAM (TEE);  Surgeon: Pixie Casino, MD;  Location:  [facility] ENDOSCOPY;  Service: Cardiovascular;  Laterality: N/A; Severe MR associated w/ flail P2 segment of MV;  no LAA thromus;  small PFO by saline microbubble contrast after valsalva; LVEF 55-60%  . TEE WITHOUT CARDIOVERSION N/A 06/21/2016   Procedure: TRANSESOPHAGEAL ECHOCARDIOGRAM (TEE);  Surgeon: Rexene Alberts, MD;  Location: [place];  Service: Open Heart Surgery;  Laterality: N/A;  . TENNIS ELBOW RELEASE/NIRSCHEL PROCEDURE Right 1990's  . TRANSTHORACIC ECHOCARDIOGRAM  12/22/2015   grade 1 diastolic dysfunction,  ef 55-60%/  moderate MVP involving posterior leaflet w/ partial flail and severe MR via CW doppler (peak gradient 48mHg)/  trivial TR     Current Outpatient Medications  Medication Sig Dispense Refill  . acetaminophen (TYLENOL) 500 MG tablet Take 2 tablets (1,000 mg total) by mouth every 6 (six) hours as needed. 30 tablet 0  . aspirin 81 MG tablet Take 81 mg by mouth as needed.     .Marland Kitchenatenolol (TENORMIN) 25 MG tablet Take 1 tablet (25 mg total) by mouth daily. 90 tablet 2  . glucose blood (ACCU-CHEK GUIDE) test strip Use to check blood sugar 3 times a day 300 each 12  . Insulin Glargine (BASAGLAR KWIKPEN) 100 UNIT/ML SOPN Inject 0.14 mLs (14 Units total) into the skin at bedtime. 15 pen 7  . Insulin Pen Needle (BD PEN NEEDLE NANO U/F) 32G X 4 MM MISC USE once  A DAY 100 each 3  . ipratropium (ATROVENT) 0.06 % nasal spray Place 2 sprays into both nostrils 4 (four) times daily. 15 mL 4  . Lancets (ACCU-CHEK MULTICLIX) lancets Use as instructed to check blood sugar 3 times a day. 300 each 12  . metFORMIN (GLUCOPHAGE-XR) 500 MG 24 hr tablet TAKE 1 TABLET BY MOUTH DAILY WITH SUPPER 90 tablet 3  . simvastatin (ZOCOR) 20 MG tablet TAKE 1 TABLET BY MOUTH EVERYDAY AT BEDTIME 90 tablet 1   No current facility-administered medications for this visit.   Facility-Administered Medications Ordered in Other Visits  Medication Dose Route Frequency Provider Last Rate Last Admin  . heparin lock flush  100 unit/mL  500 Units Intravenous Once KBrunetta Genera MD      . sodium chloride 0.9 % injection 10 mL  10 mL Intravenous PRN KBrunetta Genera MD        Allergies:   Prochlorperazine and Morphine and related    ROS:  Please see the history of present illness.   Otherwise, review of systems are positive for none.   All other systems are reviewed and negative.    PHYSICAL EXAM: VS:  BP 128/74   Temp (!) 95.1 F (35.1 C)   Ht 5' 8" (1.727 m)   Wt 188 lb (85.3 kg)   BMI 28.59 kg/m  , BMI Body mass index is 28.59 kg/m. GENERAL:  Well appearing NECK:  No jugular venous distention, waveform within normal limits, carotid upstroke brisk and symmetric, no bruits, no thyromegaly LUNGS:  Clear to auscultation bilaterally CHEST:  Well healed surgical scar. HEART:  PMI not displaced  or sustained,S1 and S2 within normal limits, no S3, no S4, no clicks, no rubs, no murmurs ABD:  Flat, positive bowel sounds normal in frequency in pitch, no bruits, no rebound, no guarding, no midline pulsatile mass, no hepatomegaly, no splenomegaly EXT:  2 plus pulses throughout, no edema, no cyanosis no clubbing   EKG:  EKG is not ordered today.  I do not see the blood pressure to the blood pressure heart rate on Mr. Forero    Recent Labs: 09/25/2018: ALT 17 02/11/2019: BUN 20; Creatinine, Ser 1.14; Hemoglobin 18.6; Magnesium 2.3; Platelets 134; Potassium 5.1; Sodium 140; TSH 4.500    Lipid Panel    Component Value Date/Time   CHOL 134 09/25/2018 0838   TRIG 79.0 09/25/2018 0838   HDL 48.20 09/25/2018 0838   CHOLHDL 3 09/25/2018 0838   VLDL 15.8 09/25/2018 0838   LDLCALC 70 09/25/2018 0838   LDLDIRECT 74.4 06/19/2006 0956      Wt Readings from Last 3 Encounters:  03/14/19 188 lb (85.3 kg)  02/11/19 185 lb 9.6 oz (84.2 kg)  02/11/19 185 lb (83.9 kg)      Other studies Reviewed: Additional studies/ records that were reviewed today include:  None. Review of the above records  demonstrates:     ASSESSMENT AND PLAN:  Frequent PVC's:   He is not having any symptoms related to this.  No change in therapy.  He can continue on the higher dose atenolol.   MV Repair: I will check this with an echocardiogram in March of next year.  CAD: Hx of 3 vessel CAD per cath in 2018.   He has no symptoms since then.  No change in therapy.   Diabetes:   A1c is 6.7.  He is followed by endocrinology.  Hypercholesterolemia:  LDL 70 and HDL 48.  No change in therapy.  Covid education: He is trying to get his vaccination.  Current medicines are reviewed at length with the patient today.  The patient does not have concerns regarding medicines.  The following changes have been made:  no change  Labs/ tests ordered today include: None No orders of the defined types were placed in this encounter.    Disposition:   FU with me in one year after echo.     Signed, Minus Breeding, MD  03/14/2019 2:02 PM    [facility]

## 2019-03-14 ENCOUNTER — Other Ambulatory Visit: Payer: Self-pay

## 2019-03-14 ENCOUNTER — Ambulatory Visit (INDEPENDENT_AMBULATORY_CARE_PROVIDER_SITE_OTHER): Payer: No Typology Code available for payment source | Admitting: Cardiology

## 2019-03-14 ENCOUNTER — Encounter: Payer: Self-pay | Admitting: Cardiology

## 2019-03-14 VITALS — BP 128/74 | Temp 95.1°F | Ht 68.0 in | Wt 188.0 lb

## 2019-03-14 DIAGNOSIS — I493 Ventricular premature depolarization: Secondary | ICD-10-CM | POA: Diagnosis not present

## 2019-03-14 DIAGNOSIS — I2510 Atherosclerotic heart disease of native coronary artery without angina pectoris: Secondary | ICD-10-CM

## 2019-03-14 DIAGNOSIS — Z9889 Other specified postprocedural states: Secondary | ICD-10-CM

## 2019-03-14 DIAGNOSIS — Z7189 Other specified counseling: Secondary | ICD-10-CM

## 2019-03-14 DIAGNOSIS — E785 Hyperlipidemia, unspecified: Secondary | ICD-10-CM

## 2019-03-14 NOTE — Patient Instructions (Signed)
Medication Instructions:  No Changes *If you need a refill on your cardiac medications before your next appointment, please call your pharmacy*  Lab Work: None  Testing/Procedures: Your physician has requested that you have an echocardiogram in MARCH 2022. Echocardiography is a painless test that uses sound waves to create images of your heart. It provides your doctor with information about the size and shape of your heart and how well your heart's chambers and valves are working. This procedure takes approximately one hour. There are no restrictions for this procedure. [place]  Follow-Up: At [facility], you and your health needs are our priority.  As part of our continuing mission to provide you with exceptional heart care, we have created designated Provider Care Teams.  These Care Teams include your primary Cardiologist (physician) and Advanced Practice Providers (APPs -  Physician Assistants and Nurse Practitioners) who all work together to provide you with the care you need, when you need it.  Your next appointment:   14 month(s)  The format for your next appointment:   In Person  Provider:   Minus Breeding, MD  Other Instructions Make appointment after Echo in March

## 2019-03-21 ENCOUNTER — Ambulatory Visit: Payer: 59 | Attending: Internal Medicine

## 2019-03-21 DIAGNOSIS — Z23 Encounter for immunization: Secondary | ICD-10-CM

## 2019-03-21 NOTE — Progress Notes (Signed)
   [id number]  Name:  David Carr.    MRN: [id number] DOB: 10/12/1945  03/21/2019  Mr. Matters was observed post Covid-19 immunization for 15 minutes without incidence. He was provided with Vaccine Information Sheet and instruction to access the V-Safe system.   [name] was instructed to call 911 with any severe reactions post vaccine: Marland Kitchen Difficulty breathing  . Swelling of your face and throat  . A fast heartbeat  . A bad rash all over your body  . Dizziness and weakness    Immunizations Administered    Name Date Dose VIS Date Route   Pfizer COVID-19 Vaccine 03/21/2019 11:39 AM 0.3 mL 01/24/2019 Intramuscular   Manufacturer: [place]   Lot: [id number]   [place]: [id number]

## 2019-03-23 ENCOUNTER — Ambulatory Visit: Payer: No Typology Code available for payment source

## 2019-03-27 ENCOUNTER — Other Ambulatory Visit: Payer: Self-pay

## 2019-03-28 ENCOUNTER — Other Ambulatory Visit: Payer: Self-pay

## 2019-03-28 ENCOUNTER — Ambulatory Visit (INDEPENDENT_AMBULATORY_CARE_PROVIDER_SITE_OTHER): Payer: No Typology Code available for payment source | Admitting: Family Medicine

## 2019-03-28 ENCOUNTER — Encounter: Payer: Self-pay | Admitting: Family Medicine

## 2019-03-28 VITALS — BP 126/68 | HR 74 | Temp 97.7°F | Ht 68.0 in | Wt 189.9 lb

## 2019-03-28 DIAGNOSIS — H00019 Hordeolum externum unspecified eye, unspecified eyelid: Secondary | ICD-10-CM

## 2019-03-28 NOTE — Patient Instructions (Signed)

## 2019-03-28 NOTE — Progress Notes (Signed)
Subjective:     Patient ID: David Shadow., male   DOB: 11-05-1945, 74 y.o.   MRN: [id number]  HPI   David Carr is seen with bilateral eye styes.  These were first noted about 9 or 10 days ago simultaneously.  David Carr has 1 involving the right lower lid and left upper lid.  Left upper lid started draining yesterday.  David Carr has been using some warm compresses.  David Carr has not had any discharge from the eye.  No blurred vision.  Minimal discomfort.  No history of blepharitis.  David Carr has not noted any eczema type changes around the lid margin.  Past Medical History:  Diagnosis Date  . AML (acute myeloid leukemia) in remission [facility]) oncologist-  dr Alvy Bimler-- per last note 10/ 2017 in remission 2 years   dx 09-11-2013  via bone marrow bx , FLT3 negative (NP M1 +)/  chemotherapy started 09-17-2013,  remission via marrow bx 11-06-2013,  4 cycles consolidation chemo w/ HiDAC 11-13-2013 to 03-05-2014  . BPH with urinary obstruction   . Chronic thrombocytopenic purpura ([place])   . Dental caries    periodontitis  . GERD (gastroesophageal reflux disease)   . History of kidney stones   . Hyperlipidemia   . Hypertension   . MVP (mitral valve prolapse)   . Pancytopenia, acquired ([place])   . 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 Severe mitral regurgitation   . Type 2 diabetes mellitus with hypoglycemia, with long-term current use of insulin ([facility]    endocrinologist-  dr gCon Memos . Wears denture    upper   Past Surgical History:  Procedure Laterality Date  . CARDIAC CATHETERIZATION    . CARDIOVASCULAR STRESS TEST  07/23/2013   Low risk nuclear study w/ mild inferior ischemia/  normal LV function and wall motion, ef 69%  . CYSTOSCOPY WITH RETROGRADE PYELOGRAM, URETEROSCOPY AND  STENT PLACEMENT Right 04/20/2016   Procedure: CYSTOSCOPY WITH RETROGRADE PYELOGRAM, URETEROSCOPY , STONE BASKETRY AND STENT PLACEMENT;  Surgeon: SFranchot Gallo MD;  [facility]  Service: Urology;  Laterality: Right;  . EXTRACORPOREAL SHOCK WAVE LITHOTRIPSY  yrs ago  . HOLMIUM LASER APPLICATION Right 35/04/2990  Procedure: HOLMIUM LASER APPLICATION;  Surgeon: SFranchot Gallo MD;  [facility]  Service: Urology;  Laterality: Right;  . INGUINAL HERNIA REPAIR Left 1990's  . MITRAL VALVE REPAIR N/A 06/21/2016   Procedure: MINIMALLY INVASIVE MITRAL VALVE REPAIR (MVR) USING SORIN MEMO 3D SIZE 32 SEMIRIGID ANNULOPLASTY RING;  Surgeon: ORexene Alberts MD;  [place]  Service: Open Heart Surgery;  Laterality: N/A;  . MULTIPLE EXTRACTIONS WITH ALVEOLOPLASTY N/A 06/08/2016   Procedure: MULTIPLE EXTRACTION WITH ALVEOLOPLASTY AND PRE-PROSTHETIC SURGERY AS NEEDED;  Surgeon: RLenn Cal DDS;  [place]  Service: Oral Surgery;  Laterality: N/A;  . PATENT FORAMEN OVALE(PFO) CLOSURE N/A 06/21/2016   Procedure: PATENT FORAMEN OVALE (PFO) CLOSURE;  Surgeon: ORexene Alberts MD;  [place]  Service: Open Heart Surgery;  Laterality: N/A;  . RIGHT/LEFT HEART CATH AND CORONARY ANGIOGRAPHY N/A 06/07/2016   Procedure: Right/Left Heart Cath and Coronary Angiography;  Surgeon: MSherren Mocha MD;  [place];  Service: Cardiovascular;  Laterality: N/A;  . ROTATOR CUFF REPAIR Right [id number] . TEE WITHOUT CARDIOVERSION N/A 01/11/2016  Procedure: TRANSESOPHAGEAL ECHOCARDIOGRAM (TEE);  Surgeon: Pixie Casino, MD;  Location: [facility] ENDOSCOPY;  Service: Cardiovascular;  Laterality: N/A; Severe MR associated w/ flail P2 segment of MV;  no LAA thromus;  small PFO by saline microbubble contrast after valsalva; LVEF 55-60%  . TEE WITHOUT CARDIOVERSION N/A 06/21/2016   Procedure: TRANSESOPHAGEAL ECHOCARDIOGRAM (TEE);  Surgeon: Rexene Alberts, MD;  Location: [place];  Service: Open Heart Surgery;  Laterality: N/A;  . TENNIS ELBOW RELEASE/NIRSCHEL PROCEDURE Right 1990's  . TRANSTHORACIC ECHOCARDIOGRAM  12/22/2015   grade 1 diastolic dysfunction,  ef 55-60%/  moderate MVP involving posterior leaflet w/ partial flail and severe MR via CW doppler (peak gradient 85mHg)/  trivial TR    reports that David Carr quit smoking about 44 years ago. His smoking use included cigarettes. David Carr has a 30.00 pack-year smoking history. David Carr has never used smokeless tobacco. David Carr reports that David Carr does not drink alcohol or use drugs. family history includes Alcoholism in his father; Asthma in his sister; Cancer in his daughter; Heart attack in his father; Obesity in his father. Allergies  Allergen Reactions  . Prochlorperazine Other (See Comments)    ARRHYTHMIAS  . Morphine And Related     Shuts bladder down.    Review of Systems  Constitutional: Negative for chills and fever.  Eyes: Negative for photophobia, pain and visual disturbance.       Objective:   Physical Exam Vitals reviewed.  Constitutional:      Appearance: Normal appearance.  Eyes:     Comments: David Carr has what appears to be a small resolving stye left upper lid.  David Carr has one involving right lower lid.  Slightly pustular center.  No conjunctivitis changes.  No eye discharge.  No eczema changes  Cardiovascular:     Rate and Rhythm: Normal rate and regular rhythm.  Pulmonary:     Effort: Pulmonary effort is normal.     Breath sounds: Normal breath sounds.  Neurological:     Mental Status: David Carr is alert.        Assessment:     Bilateral styes involving left upper lid and right lower lid    Plan:     -Continue warm compresses several times daily and gentle massage.  Suspect these will resolve spontaneously.  We explained that there is no clear role for topical antibiotics or glucocorticoids and management.  David Carr has no blepharitis changes or other clear risk factors -Be in touch if not resolving  over the next week  BEulas PostMD LGeronimoPrimary Care at [facility]

## 2019-04-02 ENCOUNTER — Other Ambulatory Visit: Payer: Self-pay

## 2019-04-02 ENCOUNTER — Ambulatory Visit (INDEPENDENT_AMBULATORY_CARE_PROVIDER_SITE_OTHER): Payer: No Typology Code available for payment source | Admitting: Family Medicine

## 2019-04-02 ENCOUNTER — Encounter: Payer: Self-pay | Admitting: Family Medicine

## 2019-04-02 VITALS — BP 118/64 | HR 61 | Temp 97.2°F | Ht 68.0 in | Wt 188.2 lb

## 2019-04-02 DIAGNOSIS — I493 Ventricular premature depolarization: Secondary | ICD-10-CM

## 2019-04-02 DIAGNOSIS — H00014 Hordeolum externum left upper eyelid: Secondary | ICD-10-CM | POA: Diagnosis not present

## 2019-04-02 DIAGNOSIS — I10 Essential (primary) hypertension: Secondary | ICD-10-CM | POA: Diagnosis not present

## 2019-04-02 NOTE — Patient Instructions (Signed)
Set up physical at some point this year.

## 2019-04-02 NOTE — Progress Notes (Signed)
Subjective:     Patient ID: David Carr., male   DOB: 07-25-1945, 74 y.o.   MRN: [id number]  HPI This is a transition of care visit.  Patient had requested transitioning care over to me recently.  We had agreed.  He has history of CAD, hypertension, type 2 diabetes, history of acute myeloid leukemia, hyperlipidemia.  He is followed by dermatology, cardiology, and endocrinology.  He has had first Covid vaccine February 5 and next a scheduled March 2.  We reviewed recent labs that are been done.  His lipids been well controlled.  He did have mildly elevated calcium of 10.4 back in December there is no albumin for correction.  This had increased from around 9.6 range in the past.  He does not take any thiazides.  He has history of PVCs which are controlled currently with atenolol.  His diabetes medications are Basaglar and Metformin.  Hordeolum/stye left upper lid.  He has been using warm compresses.  Has still not resolved.  No drainage.  Past Medical History:  Diagnosis Date  . AML (acute myeloid leukemia) in remission [facility]) oncologist-  dr Alvy Bimler-- per last note 10/ 2017 in remission 2 years   dx 09-11-2013  via bone marrow bx , FLT3 negative (NP M1 +)/  chemotherapy started 09-17-2013,  remission via marrow bx 11-06-2013,  4 cycles consolidation chemo w/ HiDAC 11-13-2013 to 03-05-2014  . BPH with urinary obstruction   . Chronic thrombocytopenic purpura ([place])   . Dental caries    periodontitis  . GERD (gastroesophageal reflux disease)   . History of kidney stones   . Hyperlipidemia   . Hypertension   . MVP (mitral valve prolapse)   . Pancytopenia, acquired ([place])   . 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 Severe mitral regurgitation   . Type 2 diabetes  mellitus with hypoglycemia, with long-term current use of insulin ([facility]    endocrinologist-  dr gCon Memos . Wears denture    upper   Past Surgical History:  Procedure Laterality Date  . CARDIAC CATHETERIZATION    . CARDIOVASCULAR STRESS TEST  07/23/2013   Low risk nuclear study w/ mild inferior ischemia/  normal LV function and wall motion, ef 69%  . CYSTOSCOPY WITH RETROGRADE PYELOGRAM, URETEROSCOPY AND STENT PLACEMENT Right 04/20/2016   Procedure: CYSTOSCOPY WITH RETROGRADE PYELOGRAM, URETEROSCOPY , STONE BASKETRY AND STENT PLACEMENT;  Surgeon: SFranchot Gallo MD;  [facility]  Service: Urology;  Laterality: Right;  . EXTRACORPOREAL SHOCK WAVE LITHOTRIPSY  yrs ago  . HOLMIUM LASER APPLICATION Right 36/06/6810  Procedure: HOLMIUM LASER APPLICATION;  Surgeon: SFranchot Gallo MD;  [facility]  Service: Urology;  Laterality: Right;  . INGUINAL HERNIA REPAIR Left 1990's  . MITRAL VALVE REPAIR N/A 06/21/2016   Procedure: MINIMALLY INVASIVE MITRAL VALVE REPAIR (MVR) USING SORIN MEMO 3D SIZE 32 SEMIRIGID ANNULOPLASTY RING;  Surgeon: ORexene Alberts MD;  [place]  Service: Open Heart Surgery;  Laterality: N/A;  . MULTIPLE EXTRACTIONS WITH ALVEOLOPLASTY N/A 06/08/2016   Procedure: MULTIPLE EXTRACTION WITH ALVEOLOPLASTY AND PRE-PROSTHETIC SURGERY AS NEEDED;  Surgeon: RLenn Cal DDS;  [place]  Service: Oral Surgery;  Laterality: N/A;  . PATENT FORAMEN OVALE(PFO) CLOSURE N/A 06/21/2016   Procedure: PATENT FORAMEN OVALE (PFO) CLOSURE;  Surgeon: Rexene Alberts, MD;  Location: [place];  Service: Open Heart Surgery;  Laterality: N/A;  . RIGHT/LEFT HEART CATH AND CORONARY ANGIOGRAPHY N/A 06/07/2016   Procedure: Right/Left Heart Cath and Coronary Angiography;  Surgeon: Sherren Mocha, MD;  Location: [place] CV LAB;  Service: Cardiovascular;  Laterality: N/A;  . ROTATOR CUFF REPAIR Right [id number]  . TEE WITHOUT CARDIOVERSION N/A  01/11/2016   Procedure: TRANSESOPHAGEAL ECHOCARDIOGRAM (TEE);  Surgeon: Pixie Casino, MD;  Location: [facility] ENDOSCOPY;  Service: Cardiovascular;  Laterality: N/A; Severe MR associated w/ flail P2 segment of MV;  no LAA thromus;  small PFO by saline microbubble contrast after valsalva; LVEF 55-60%  . TEE WITHOUT CARDIOVERSION N/A 06/21/2016   Procedure: TRANSESOPHAGEAL ECHOCARDIOGRAM (TEE);  Surgeon: Rexene Alberts, MD;  Location: [place];  Service: Open Heart Surgery;  Laterality: N/A;  . TENNIS ELBOW RELEASE/NIRSCHEL PROCEDURE Right 1990's  . TRANSTHORACIC ECHOCARDIOGRAM  12/22/2015   grade 1 diastolic dysfunction,  ef 55-60%/  moderate MVP involving posterior leaflet w/ partial flail and severe MR via CW doppler (peak gradient 32mHg)/  trivial TR    reports that he quit smoking about 44 years ago. His smoking use included cigarettes. He has a 30.00 pack-year smoking history. He has never used smokeless tobacco. He reports that he does not drink alcohol or use drugs. family history includes Alcoholism in his father; Asthma in his sister; Cancer in his daughter; Heart attack in his father; Obesity in his father. Allergies  Allergen Reactions  . Prochlorperazine Other (See Comments)    ARRHYTHMIAS  . Morphine And Related     Shuts bladder down.     Review of Systems  Constitutional: Negative for chills, fatigue, fever and unexpected weight change.  Eyes: Negative for visual disturbance.  Respiratory: Negative for cough, chest tightness and shortness of breath.   Cardiovascular: Negative for chest pain, palpitations and leg swelling.  Endocrine: Negative for polydipsia and polyuria.  Neurological: Negative for dizziness, syncope, weakness, light-headedness and headaches.       Objective:   Physical Exam Vitals reviewed.  Constitutional:      Appearance: Normal appearance.  Eyes:     Comments: He has stye left upper lid.  Right lower lid stye has basically resolved  Cardiovascular:      Comments: Frequent premature beats. Pulmonary:     Effort: Pulmonary effort is normal.     Breath sounds: Normal breath sounds. No wheezing or rales.  Musculoskeletal:     Cervical back: Neck supple.     Right lower leg: No edema.     Left lower leg: No edema.  Neurological:     Mental Status: He is alert.        Assessment:     #1 hyperlipidemia treated with simvastatin.  His lipids were checked in August and well-controlled  #2 history of frequent PVCs controlled with atenolol  #3 stye/hordeolum left upper lid  #4 type 2 diabetes followed by endocrinology  #5 mildly elevated calcium by recent labs but this was not corrected as there was no albumin.    Plan:     -Continue warm compresses the left upper lid and let me know if this is not resolving over the next 1 week  -Set up complete physical at some point later this year.  Reassess labs in including calcium with albumin for correction.  He will try to set this up of the next few months  BEulas PostMD [facility]

## 2019-04-15 ENCOUNTER — Ambulatory Visit: Payer: 59 | Attending: Internal Medicine

## 2019-04-15 DIAGNOSIS — Z23 Encounter for immunization: Secondary | ICD-10-CM | POA: Insufficient documentation

## 2019-04-15 NOTE — Progress Notes (Signed)
   [id number]  Name:  Teruo Lovins.    MRN: [id number] DOB: [birth year]  04/15/2019  Mr. Hanrahan was observed post Covid-19 immunization for 15 minutes without incident. He was provided with Vaccine Information Sheet and instruction to access the V-Safe system.   Mr. Kerkman was instructed to call 911 with any severe reactions post vaccine: Marland Kitchen Difficulty breathing  . Swelling of face and throat  . A fast heartbeat  . A bad rash all over body  . Dizziness and weakness   Immunizations Administered    Name Date Dose VIS Date Route   Pfizer COVID-19 Vaccine 04/15/2019 11:44 AM 0.3 mL 01/24/2019 Intramuscular   Manufacturer: [place]   Lot: [id number]   [place]: [id number]

## 2019-04-16 ENCOUNTER — Other Ambulatory Visit: Payer: Self-pay | Admitting: Internal Medicine

## 2019-05-01 LAB — HM DIABETES EYE EXAM

## 2019-06-10 ENCOUNTER — Encounter: Payer: Self-pay | Admitting: Internal Medicine

## 2019-06-10 ENCOUNTER — Ambulatory Visit: Payer: 59 | Admitting: Internal Medicine

## 2019-06-10 ENCOUNTER — Other Ambulatory Visit: Payer: Self-pay

## 2019-06-10 VITALS — BP 120/60 | HR 75 | Ht 68.0 in | Wt 186.0 lb

## 2019-06-10 DIAGNOSIS — E785 Hyperlipidemia, unspecified: Secondary | ICD-10-CM

## 2019-06-10 DIAGNOSIS — E663 Overweight: Secondary | ICD-10-CM

## 2019-06-10 DIAGNOSIS — Z794 Long term (current) use of insulin: Secondary | ICD-10-CM | POA: Diagnosis not present

## 2019-06-10 DIAGNOSIS — E11649 Type 2 diabetes mellitus with hypoglycemia without coma: Secondary | ICD-10-CM | POA: Diagnosis not present

## 2019-06-10 LAB — POCT GLYCOSYLATED HEMOGLOBIN (HGB A1C): Hemoglobin A1C: 7.2 % — AB (ref 4.0–5.6)

## 2019-06-10 MED ORDER — GLIPIZIDE 5 MG PO TABS: ORAL_TABLET | ORAL | 5 refills | Status: DC

## 2019-06-10 MED ORDER — BASAGLAR KWIKPEN 100 UNIT/ML ~~LOC~~ SOPN: PEN_INJECTOR | SUBCUTANEOUS | 3 refills | Status: DC

## 2019-06-10 NOTE — Patient Instructions (Signed)
Please continue: - Metformin ER 500 mg with dinner - Basaglar 14 units at bedtime  Please take: - Glipizide 2.5-5 mg before a larger meal or if you have dessert  Please return in 4 months with your sugar log.

## 2019-06-10 NOTE — Progress Notes (Signed)
Patient ID: David Carr., male   DOB: Jan 04, 1946, 74 y.o.   MRN: [id number]   This visit occurred during the SARS-CoV-2 public health emergency.  Safety protocols were in place, including screening questions prior to the visit, additional usage of staff PPE, and extensive cleaning of exam room while observing appropriate contact time as indicated for disinfecting solutions.   HPI: David Carr. is a 74 y.o.-year-old male, returning for f/u for DM2, dx at the end of the 74s, insulin-dependent since ~2014, uncontrolled, without long term complications. Last visit 4 months ago.  Reviewed HbA1c levels: Lab Results  Component Value Date   HGBA1C 6.7 (A) 02/11/2019   HGBA1C 6.8 (A) 10/07/2018   HGBA1C 6.8 (A) 05/09/2018   He is on: - Metformin ER [phone] mg with dinner >> 500 mg with dinner (she had some dizziness and shortness of breath with higher doses) - Basaglar 14 units at bedtime He stopped Jardiance 10 mg b/c increased urination and dehydration (felt like being drunk). He was on 45 units of Humalog 75/25 at bedtime in the past.  Pt checks his sugars 2 times a day: - am:  90s-115 >> 116-140, 150 >> 90's, 102-136 >> 88, 108-135 - 2h after b'fast: 126, 128 >> n/c  >> 120, 181,  254 >> n/c - before lunch: 115-130 >> 98-125 >> 120-150 >> 101-125, 146 - 2h after lunch: 152 >> 193 >> n/c >> 117-181 >> n/c >> 172, 194 - before dinner: 94-124, 174, 182 (grapes) >> ? >> 120, 146 - 2h after dinner: 170-180, >200 >> 120-192 >> ?, 220 >> 154-213, 236 - bedtime: 125 >> n/c >> 144-168, 223 >> see above - nighttime: n/c Lowest sugar was: 70s x2 >> .Marland Kitchen.90s >> 88.  He has hypoglycemia awareness in the 80s. He had an episode of loss of consciousness when sugars reached 60 in 07/17/2016.  Highest sugar was 306 >> 236.  Glucometer: Freestyle Lite  Pt's meals are: - Breakfast: coffee + nab or slice pound cake + pop tarts - Lunch: fast food or sandwich or skips - Dinner: meat +  veggies  - Snacks: 1, before bedtime  He continues to walk for exercise  -No CKD, last BUN/creatinine:  Lab Results  Component Value Date   BUN 20 02/11/2019   BUN 14 09/25/2018   CREATININE 1.14 02/11/2019   CREATININE 0.97 09/25/2018   -+ HL; last set of lipids: Lab Results  Component Value Date   CHOL 134 09/25/2018   HDL 48.20 09/25/2018   LDLCALC 70 09/25/2018   LDLDIRECT 74.4 06/19/2006   TRIG 79.0 09/25/2018   CHOLHDL 3 09/25/2018  On Zocor - last eye exam was in 04/2019: No DR -No numbness and tingling in his feet.  He has AML in remission.  He was discharged by Dr. Alvy Bimler from the oncology clinic due to persistent remission.  Since last visit he was found to have multiple PVCs and his atenolol dose was increased.  ROS: Constitutional: no weight gain/no weight loss, no fatigue, no subjective hyperthermia, no subjective hypothermia Eyes: no blurry vision, no xerophthalmia ENT: no sore throat, no nodules palpated in neck, no dysphagia, no odynophagia, no hoarseness Cardiovascular: no CP/no SOB/no palpitations/no leg swelling Respiratory: no cough/no SOB/no wheezing Gastrointestinal: no N/no V/no D/no C/no acid reflux Musculoskeletal: no muscle aches/no joint aches Skin: no rashes, no hair loss Neurological: no tremors/no numbness/no tingling/no dizziness  I reviewed pt's medications, allergies, PMH, social hx, family hx, and changes were  documented in the history of present illness. Otherwise, unchanged from my initial visit note.  Past Medical History:  Diagnosis Date  . AML (acute myeloid leukemia) in remission [facility]) oncologist-  dr Alvy Bimler-- per last note 10/ 2017 in remission 2 years   dx 09-11-2013  via bone marrow bx , FLT3 negative (NP M1 +)/  chemotherapy started 09-17-2013,  remission via marrow bx 11-06-2013,  4 cycles consolidation chemo w/ HiDAC 11-13-2013 to 03-05-2014  . BPH with urinary obstruction   . Chronic thrombocytopenic purpura ([place])   .  Dental caries    periodontitis  . GERD (gastroesophageal reflux disease)   . History of kidney stones   . Hyperlipidemia   . Hypertension   . MVP (mitral valve prolapse)   . Pancytopenia, acquired ([place])   . 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   Complex valvuloplasty including triangular resection of flail segment of posterior leaflet, artificial Gore-tex neochord placement x6 and 26m Sorin Memo 3D ring annuloplasty via right mini thoracotomy approach  . Severe mitral regurgitation   . Type 2 diabetes mellitus with hypoglycemia, with long-term current use of insulin ([facility]    endocrinologist-  dr gCon Memos . Wears denture    upper   Past Surgical History:  Procedure Laterality Date  . CARDIAC CATHETERIZATION    . CARDIOVASCULAR STRESS TEST  07/23/2013   Low risk nuclear study w/ mild inferior ischemia/  normal LV function and wall motion, ef 69%  . CYSTOSCOPY WITH RETROGRADE PYELOGRAM, URETEROSCOPY AND STENT PLACEMENT Right 04/20/2016   Procedure: CYSTOSCOPY WITH RETROGRADE PYELOGRAM, URETEROSCOPY , STONE BASKETRY AND STENT PLACEMENT;  Surgeon: SFranchot Gallo MD;  [facility]  Service: Urology;  Laterality: Right;  . EXTRACORPOREAL SHOCK WAVE LITHOTRIPSY  yrs ago  . HOLMIUM LASER APPLICATION Right 34/07/8030  Procedure: HOLMIUM LASER APPLICATION;  Surgeon: SFranchot Gallo MD;  [facility]  Service: Urology;  Laterality: Right;  . INGUINAL HERNIA REPAIR Left 1990's  . MITRAL VALVE REPAIR N/A 06/21/2016   Procedure: MINIMALLY INVASIVE MITRAL VALVE REPAIR (MVR) USING SORIN MEMO 3D SIZE 32 SEMIRIGID ANNULOPLASTY RING;  Surgeon: ORexene Alberts MD;  [place]  Service: Open Heart Surgery;  Laterality: N/A;  . MULTIPLE EXTRACTIONS WITH ALVEOLOPLASTY N/A 06/08/2016   Procedure: MULTIPLE EXTRACTION WITH ALVEOLOPLASTY AND PRE-PROSTHETIC SURGERY AS NEEDED;  Surgeon: RLenn Cal DDS;  [place]  Service: Oral  Surgery;  Laterality: N/A;  . PATENT FORAMEN OVALE(PFO) CLOSURE N/A 06/21/2016   Procedure: PATENT FORAMEN OVALE (PFO) CLOSURE;  Surgeon: ORexene Alberts MD;  [place]  Service: Open Heart Surgery;  Laterality: N/A;  . RIGHT/LEFT HEART CATH AND CORONARY ANGIOGRAPHY N/A 06/07/2016   Procedure: Right/Left Heart Cath and Coronary Angiography;  Surgeon: MSherren Mocha MD;  [place];  Service: Cardiovascular;  Laterality: N/A;  . ROTATOR CUFF REPAIR Right [id number] . TEE WITHOUT CARDIOVERSION N/A 01/11/2016   Procedure: TRANSESOPHAGEAL ECHOCARDIOGRAM (TEE);  Surgeon: KPixie Casino MD;  [facility]  Service: Cardiovascular;  Laterality: N/A; Severe MR associated w/ flail P2 segment of MV;  no LAA thromus;  small PFO by saline microbubble contrast after valsalva; LVEF 55-60%  . TEE WITHOUT CARDIOVERSION N/A 06/21/2016   Procedure: TRANSESOPHAGEAL ECHOCARDIOGRAM (TEE);  Surgeon: ORexene Alberts MD;  [place]  Service: Open Heart Surgery;  Laterality: N/A;  . TENNIS ELBOW RELEASE/NIRSCHEL PROCEDURE Right 1990's  . TRANSTHORACIC ECHOCARDIOGRAM  12/22/2015   grade  1 diastolic dysfunction,  ef 55-60%/  moderate MVP involving posterior leaflet w/ partial flail and severe MR via CW doppler (peak gradient 30mHg)/  trivial TR   Social History   Social History  . Marital status: Married    Spouse name: N/A  . Number of children: 2   Occupational History  . accountant   Social History Main Topics  . Smoking status: Former SResearch scientist (life sciences) . Smokeless tobacco: Never Used  . Alcohol use No  . Drug use: No   Current Outpatient Medications on File Prior to Visit  Medication Sig Dispense Refill  . acetaminophen (TYLENOL) 500 MG tablet Take 2 tablets (1,000 mg total) by mouth every 6 (six) hours as needed. 30 tablet 0  . aspirin 81 MG tablet Take 81 mg by mouth as needed.     .Marland Kitchenatenolol (TENORMIN) 25 MG tablet Take 1 tablet (25 mg total) by mouth daily. 90 tablet  2  . glucose blood (ACCU-CHEK GUIDE) test strip Use to check blood sugar 3 times a day 300 each 12  . Insulin Glargine (BASAGLAR KWIKPEN) 100 UNIT/ML INJECT 16 UNITS INTO THE SKIN AT BEDTIME 5 pen 1  . Insulin Pen Needle (BD PEN NEEDLE NANO U/F) 32G X 4 MM MISC USE once  A DAY 100 each 3  . ipratropium (ATROVENT) 0.06 % nasal spray Place 2 sprays into both nostrils 4 (four) times daily. 15 mL 4  . Lancets (ACCU-CHEK MULTICLIX) lancets Use as instructed to check blood sugar 3 times a day. 300 each 12  . metFORMIN (GLUCOPHAGE-XR) 500 MG 24 hr tablet TAKE 1 TABLET BY MOUTH DAILY WITH SUPPER 90 tablet 3  . simvastatin (ZOCOR) 20 MG tablet TAKE 1 TABLET BY MOUTH EVERYDAY AT BEDTIME 90 tablet 1   Current Facility-Administered Medications on File Prior to Visit  Medication Dose Route Frequency Provider Last Rate Last Admin  . heparin lock flush 100 unit/mL  500 Units Intravenous Once KBrunetta Genera MD      . sodium chloride 0.9 % injection 10 mL  10 mL Intravenous PRN KBrunetta Genera MD        Allergies  Allergen Reactions  . Prochlorperazine Other (See Comments)    ARRHYTHMIAS  . Morphine And Related     Shuts bladder down.   Family History  Problem Relation Age of Onset  . Heart attack Father   . Alcoholism Father   . Obesity Father   . Asthma Sister   . Cancer Daughter        carcinoid tumor   PE: BP 120/60   Pulse 75   Ht '5\' 8"'$  (1.727 m)   Wt 186 lb (84.4 kg)   SpO2 94%   BMI 28.28 kg/m  Wt Readings from Last 3 Encounters:  06/10/19 186 lb (84.4 kg)  04/02/19 188 lb 3.2 oz (85.4 kg)  03/28/19 189 lb 14.4 oz (86.1 kg)   Constitutional: overweight, in NAD Eyes: PERRLA, EOMI, no exophthalmos ENT: moist mucous membranes, no thyromegaly, no cervical lymphadenopathy Cardiovascular: RRR, No MRG Respiratory: CTA B Gastrointestinal: abdomen soft, NT, ND, BS+ Musculoskeletal: no deformities, strength intact in all 4 Skin: moist, warm, no rashes Neurological: no  tremor with outstretched hands, DTR normal in all 4  ASSESSMENT: 1. DM2, insulin-dependent, uncontrolled, without Long term complications, but with hypoglycemia  2. HL  3.  Overweight  4. Irregularly irregular heart rhythm  PLAN:  1. Patient with longstanding, previously uncontrolled type 2 diabetes, currently on low-dose Metformin and  long-acting insulin with good control.  He was on a high-dose Metformin in the past but this caused him dizziness.  He tolerates well the Metformin ER but only at 500 mg daily.  He was previously on Jardiance but we had to stop since this caused increased urination and UTI. -At last visit sugars were still at goal the majority of the time.  We did not change his regimen then. -At last visit HbA1c was slightly better, at 6.7% -At this visit, sugars are still at goal most of the time with occasional hyperglycemic spikes after dietary indiscretions.  He occasionally eats pancakes or cereals at night, after which sugars increase.  We discussed that ideally he would not eat these at night.  We also discussed about using a small dose of glipizide before dinner in case he has a large meal or dessert after meal.  Otherwise, we will continue the current dose of Metformin and Basaglar. -I advised him to: Patient Instructions  Please continue: - Metformin ER 500 mg with dinner - Basaglar 14 units at bedtime  Please take: - Glipizide 2.5-5 mg before a larger meal or if you have dessert  Please return in 4 months with your sugar log.   - we checked his HbA1c: 7.2% (higher) - advised to check sugars at different times of the day - 1x a day, rotating check times - advised for yearly eye exams >> he is UTD - return to clinic in 4 months     2. HL -Reviewed latest lipid panel from 09/2018: Fractions at goal Lab Results  Component Value Date   CHOL 134 09/25/2018   HDL 48.20 09/25/2018   LDLCALC 70 09/25/2018   LDLDIRECT 74.4 06/19/2006   TRIG 79.0 09/25/2018    CHOLHDL 3 09/25/2018  -Continue Zocor without side effects  3.  Overweight -Unfortunately, we had to stop Jardiance, which was helping with weight loss, also -We will continue Metformin which is a mild appetite suppressant effect and may help control weight loss -No weight loss since last visit  Philemon Kingdom, MD PhD [facility] Endocrinology

## 2019-06-20 ENCOUNTER — Other Ambulatory Visit: Payer: Self-pay | Admitting: Internal Medicine

## 2019-06-26 ENCOUNTER — Other Ambulatory Visit: Payer: Self-pay | Admitting: Internal Medicine

## 2019-07-16 ENCOUNTER — Other Ambulatory Visit: Payer: Self-pay | Admitting: Family Medicine

## 2019-07-16 DIAGNOSIS — E7800 Pure hypercholesterolemia, unspecified: Secondary | ICD-10-CM

## 2019-07-17 NOTE — Telephone Encounter (Signed)
Pt states he is out of this medication   Medication Refill: Simvastatin Pharmacy: [facility] [place]: [phone]

## 2019-09-26 ENCOUNTER — Other Ambulatory Visit: Payer: Self-pay

## 2019-09-26 ENCOUNTER — Ambulatory Visit (INDEPENDENT_AMBULATORY_CARE_PROVIDER_SITE_OTHER): Payer: No Typology Code available for payment source | Admitting: Family Medicine

## 2019-09-26 ENCOUNTER — Encounter: Payer: Self-pay | Admitting: Family Medicine

## 2019-09-26 VITALS — BP 124/68 | HR 72 | Temp 98.2°F | Ht 68.0 in | Wt 187.7 lb

## 2019-09-26 DIAGNOSIS — Z0000 Encounter for general adult medical examination without abnormal findings: Secondary | ICD-10-CM | POA: Diagnosis not present

## 2019-09-26 NOTE — Patient Instructions (Signed)

## 2019-09-26 NOTE — Progress Notes (Signed)
Established Patient Office Visit  Subjective:  Patient ID: David Ledwell., male    DOB: 1945-04-17  Age: 74 y.o. MRN: [id number]  CC:  Chief Complaint  Patient presents with  . Annual Exam    HPI David Carr. presents for physical exam.  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  Followed by endocrinology for his diabetes.  Last A1c was 7.2%.  He has follow-up there soon.  Health maintenance reviewed  -Covid vaccines given -Pneumonia vaccines complete -He has had both shingles vaccines -Tetanus was given April 2021 -Eye exam up-to-date -Gets yearly flu vaccine -He had multiple colonoscopies and never had any polyps previously.  He is declined further colonoscopies at this time   Past Medical History:  Diagnosis Date  . AML (acute myeloid leukemia) in remission [facility]) oncologist-  dr Alvy Bimler-- per last note 10/ 2017 in remission 2 years   dx 09-11-2013  via bone marrow bx , FLT3 negative (NP M1 +)/  chemotherapy started 09-17-2013,  remission via marrow bx 11-06-2013,  4 cycles consolidation chemo w/ HiDAC 11-13-2013 to 03-05-2014  . BPH with urinary obstruction   . Chronic thrombocytopenic purpura ([place])   . Dental caries    periodontitis  . GERD (gastroesophageal reflux disease)   . History of kidney stones   . Hyperlipidemia   . Hypertension   . MVP (mitral valve prolapse)   . Pancytopenia, acquired ([place])   . 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 Severe mitral regurgitation   . Type 2 diabetes mellitus with hypoglycemia, with long-term current use of insulin ([facility]    endocrinologist-  dr  gCon Memos . Wears denture    upper    Past Surgical History:  Procedure Laterality Date  . CARDIAC CATHETERIZATION    . CARDIOVASCULAR STRESS TEST  07/23/2013   Low risk nuclear study w/ mild inferior ischemia/  normal LV function and wall motion, ef 69%  . CYSTOSCOPY WITH RETROGRADE PYELOGRAM, URETEROSCOPY AND STENT PLACEMENT Right 04/20/2016   Procedure: CYSTOSCOPY WITH RETROGRADE PYELOGRAM, URETEROSCOPY , STONE BASKETRY AND STENT PLACEMENT;  Surgeon: SFranchot Gallo MD;  [place] [facility]  Service: Urology;  Laterality: Right;  . EXTRACORPOREAL SHOCK WAVE LITHOTRIPSY  yrs ago  . HOLMIUM LASER APPLICATION Right 34/0/9811  Procedure: HOLMIUM LASER APPLICATION;  Surgeon: SFranchot Gallo MD;  [facility]  Service: Urology;  Laterality: Right;  . INGUINAL HERNIA REPAIR Left 1990's  . MITRAL VALVE REPAIR N/A 06/21/2016   Procedure: MINIMALLY INVASIVE MITRAL VALVE REPAIR (MVR) USING SORIN MEMO 3D SIZE 32 SEMIRIGID ANNULOPLASTY RING;  Surgeon: ORexene Alberts MD;  [place] [facility]  Service: Open Heart Surgery;  Laterality: N/A;  . MULTIPLE EXTRACTIONS WITH ALVEOLOPLASTY N/A 06/08/2016   Procedure: MULTIPLE EXTRACTION WITH ALVEOLOPLASTY AND PRE-PROSTHETIC SURGERY AS NEEDED;  Surgeon: RLenn Cal DDS;  [place] [facility]  Service: Oral Surgery;  Laterality: N/A;  . PATENT FORAMEN OVALE(PFO) CLOSURE N/A 06/21/2016   Procedure: PATENT FORAMEN OVALE (PFO) CLOSURE;  Surgeon: ORexene Alberts MD;  [place]  Service: Open Heart Surgery;  Laterality: N/A;  . RIGHT/LEFT HEART CATH AND CORONARY ANGIOGRAPHY N/A 06/07/2016  Procedure: Right/Left Heart Cath and Coronary Angiography;  Surgeon: Sherren Mocha, MD;  Location: [place] [facility];  Service: Cardiovascular;  Laterality: N/A;  . ROTATOR CUFF REPAIR Right [id number]  . TEE WITHOUT CARDIOVERSION N/A 01/11/2016   Procedure: TRANSESOPHAGEAL ECHOCARDIOGRAM (TEE);  Surgeon: Pixie Casino, MD;   Location: [facility] ENDOSCOPY;  Service: Cardiovascular;  Laterality: N/A; Severe MR associated w/ flail P2 segment of MV;  no LAA thromus;  small PFO by saline microbubble contrast after valsalva; LVEF 55-60%  . TEE WITHOUT CARDIOVERSION N/A 06/21/2016   Procedure: TRANSESOPHAGEAL ECHOCARDIOGRAM (TEE);  Surgeon: Rexene Alberts, MD;  Location: [place];  Service: Open Heart Surgery;  Laterality: N/A;  . TENNIS ELBOW RELEASE/NIRSCHEL PROCEDURE Right 1990's  . TRANSTHORACIC ECHOCARDIOGRAM  12/22/2015   grade 1 diastolic dysfunction,  ef 55-60%/  moderate MVP involving posterior leaflet w/ partial flail and severe MR via CW doppler (peak gradient 83mHg)/  trivial TR    Family History  Problem Relation Age of Onset  . Heart attack Father   . Alcoholism Father   . Obesity Father   . Asthma Sister   . Cancer Daughter        carcinoid tumor    Social History   Socioeconomic History  . Marital status: Married    Spouse name: Not on file  . Number of children: 2  . Years of education: college grad  . Highest education level: Not on file  Occupational History    Comment: retired, NA  Tobacco Use  . Smoking status: Former Smoker    Packs/day: 1.50    Years: 20.00    Pack years: 30.00    Types: Cigarettes    Quit date: 04/18/1974    Years since quitting: 45.4  . Smokeless tobacco: Never Used  Vaping Use  . Vaping Use: Never used  Substance and Sexual Activity  . Alcohol use: No  . Drug use: No  . Sexual activity: Not Currently  Other Topics Concern  . Not on file  Social History Narrative   Lives wife,  Son   Coffee, 1 cup  daily   Social Determinants of Health   Financial Resource Strain:   . Difficulty of Paying Living Expenses:   Food Insecurity:   . Worried About RCharity fundraiserin the Last Year:   . RArboriculturistin the Last Year:   Transportation Needs:   . LFilm/video editor(Medical):   .Marland KitchenLack of Transportation (Non-Medical):   Physical Activity:   . Days of  Exercise per Week:   . Minutes of Exercise per Session:   Stress:   . Feeling of Stress :   Social Connections:   . Frequency of Communication with Friends and Family:   . Frequency of Social Gatherings with Friends and Family:   . Attends Religious Services:   . Active Member of Clubs or Organizations:   . Attends CArchivistMeetings:   .Marland KitchenMarital Status:   Intimate Partner Violence:   . Fear of Current or Ex-Partner:   . Emotionally Abused:   .Marland KitchenPhysically Abused:   . Sexually Abused:     Outpatient Medications Prior to Visit  Medication Sig Dispense Refill  . ACCU-CHEK GUIDE test strip USE TO CHECK BLOOD SUGAR 3 TIMES A DAY 300 strip 12  . acetaminophen (TYLENOL) 500 MG tablet Take 2 tablets (1,000 mg total) by mouth every 6 (six) hours as needed. 30 tablet 0  . aspirin 81 MG  tablet Take 81 mg by mouth as needed.     Marland Kitchen atenolol (TENORMIN) 25 MG tablet Take 1 tablet (25 mg total) by mouth daily. 90 tablet 2  . glipiZIDE (GLUCOTROL) 5 MG tablet Take 0.5-1 tablet before a larger meal 30 tablet 5  . Insulin Glargine (BASAGLAR KWIKPEN) 100 UNIT/ML INJECT 14-16 UNITS INTO THE SKIN AT BEDTIME 10 pen 3  . Insulin Pen Needle (BD PEN NEEDLE NANO U/F) 32G X 4 MM MISC USE 4 TIMES A DAY 300 each 3  . ipratropium (ATROVENT) 0.06 % nasal spray Place 2 sprays into both nostrils 4 (four) times daily. 15 mL 4  . Lancets (ACCU-CHEK MULTICLIX) lancets Use as instructed to check blood sugar 3 times a day. 300 each 12  . metFORMIN (GLUCOPHAGE-XR) 500 MG 24 hr tablet TAKE 1 TABLET BY MOUTH DAILY WITH SUPPER 90 tablet 3  . simvastatin (ZOCOR) 20 MG tablet TAKE 1 TABLET BY MOUTH EVERYDAY AT BEDTIME 90 tablet 1   Facility-Administered Medications Prior to Visit  Medication Dose Route Frequency Provider Last Rate Last Admin  . heparin lock flush 100 unit/mL  500 Units Intravenous Once Brunetta Genera, MD      . sodium chloride 0.9 % injection 10 mL  10 mL Intravenous PRN Brunetta Genera,  MD        Allergies  Allergen Reactions  . Prochlorperazine Other (See Comments)    ARRHYTHMIAS  . Morphine And Related     Shuts bladder down.    ROS Review of Systems  Constitutional: Negative for activity change, appetite change, fatigue, fever and unexpected weight change.  HENT: Negative for congestion, ear pain and trouble swallowing.   Eyes: Negative for pain and visual disturbance.  Respiratory: Negative for cough, shortness of breath and wheezing.   Cardiovascular: Negative for chest pain and palpitations.  Gastrointestinal: Negative for abdominal distention, abdominal pain, blood in stool, constipation, diarrhea, nausea, rectal pain and vomiting.  Endocrine: Negative for polydipsia and polyuria.  Genitourinary: Negative for dysuria, hematuria and testicular pain.  Musculoskeletal: Negative for arthralgias and joint swelling.  Skin: Negative for rash.  Neurological: Negative for dizziness, syncope and headaches.  Hematological: Negative for adenopathy.  Psychiatric/Behavioral: Negative for confusion and dysphoric mood.      Objective:    Physical Exam Constitutional:      General: He is not in acute distress.    Appearance: He is well-developed.  HENT:     Head: Normocephalic and atraumatic.     Right Ear: External ear normal.     Left Ear: External ear normal.  Eyes:     Conjunctiva/sclera: Conjunctivae normal.     Pupils: Pupils are equal, round, and reactive to light.  Neck:     Thyroid: No thyromegaly.  Cardiovascular:     Rate and Rhythm: Normal rate and regular rhythm.     Heart sounds: Normal heart sounds. No murmur heard.   Pulmonary:     Effort: No respiratory distress.     Breath sounds: No wheezing or rales.  Abdominal:     General: Bowel sounds are normal. There is no distension.     Palpations: Abdomen is soft.     Tenderness: There is no abdominal tenderness. There is no guarding or rebound.     Comments: He has umbilical hernia which is  soft and nontender  Musculoskeletal:     Cervical back: Normal range of motion and neck supple.     Right lower leg: No edema.  Left lower leg: No edema.  Lymphadenopathy:     Cervical: No cervical adenopathy.  Skin:    Findings: No rash.  Neurological:     Mental Status: He is alert and oriented to person, place, and time.     Cranial Nerves: No cranial nerve deficit.     Deep Tendon Reflexes: Reflexes normal.     BP 124/68 (BP Location: Left Arm, Patient Position: Sitting, Cuff Size: Normal)   Pulse 72   Temp 98.2 F (36.8 C) (Oral)   Ht '5\' 8"'$  (1.727 m)   Wt 187 lb 11.2 oz (85.1 kg)   SpO2 98%   BMI 28.54 kg/m  Wt Readings from Last 3 Encounters:  09/26/19 187 lb 11.2 oz (85.1 kg)  06/10/19 186 lb (84.4 kg)  04/02/19 188 lb 3.2 oz (85.4 kg)     Health Maintenance Due  Topic Date Due  . FOOT EXAM  01/09/2019  . INFLUENZA VACCINE  09/14/2019  . URINE MICROALBUMIN  09/25/2019    There are no preventive care reminders to display for this patient.  Lab Results  Component Value Date   TSH 4.500 02/11/2019   Lab Results  Component Value Date   WBC 6.3 02/11/2019   HGB 18.6 (H) 02/11/2019   HCT 52.6 (H) 02/11/2019   MCV 91 02/11/2019   PLT 134 (L) 02/11/2019   Lab Results  Component Value Date   NA 140 02/11/2019   K 5.1 02/11/2019   CHLORIDE 104 12/02/2015   CO2 22 02/11/2019   GLUCOSE 125 (H) 02/11/2019   BUN 20 02/11/2019   CREATININE 1.14 02/11/2019   BILITOT 1.0 09/25/2018   ALKPHOS 60 09/25/2018   AST 18 09/25/2018   ALT 17 09/25/2018   PROT 6.4 09/25/2018   ALBUMIN 4.3 09/25/2018   CALCIUM 10.4 (H) 02/11/2019   ANIONGAP 8 08/30/2017   EGFR 71 (L) 12/02/2015   GFR 75.43 09/25/2018   Lab Results  Component Value Date   CHOL 134 09/25/2018   Lab Results  Component Value Date   HDL 48.20 09/25/2018   Lab Results  Component Value Date   LDLCALC 70 09/25/2018   Lab Results  Component Value Date   TRIG 79.0 09/25/2018   Lab Results   Component Value Date   CHOLHDL 3 09/25/2018   Lab Results  Component Value Date   HGBA1C 7.2 (A) 06/10/2019      Assessment & Plan:   Problem List Items Addressed This Visit    None    Visit Diagnoses    Physical exam    -  Primary   Relevant Orders   CBC with Differential/Platelet   Lipid panel   CMP   Microalbumin / creatinine urine ratio    -Continue with annual flu vaccine -Obtain labs above. -We discussed PSA screening and reviewed [place] preventive task force guidelines regarding PSA screening after 70.  He has decided against this.  No orders of the defined types were placed in this encounter.   Follow-up: No follow-ups on file.    Carolann Littler, MD

## 2019-09-27 ENCOUNTER — Encounter: Payer: Self-pay | Admitting: Family Medicine

## 2019-09-27 LAB — CBC WITH DIFFERENTIAL/PLATELET
Absolute Monocytes: 480 cells/uL (ref 200–950)
Basophils Absolute: 20 cells/uL (ref 0–200)
Basophils Relative: 0.4 %
Eosinophils Absolute: 29 cells/uL (ref 15–500)
Eosinophils Relative: 0.6 %
HCT: 50.4 % — ABNORMAL HIGH (ref 38.5–50.0)
Hemoglobin: 17.3 g/dL — ABNORMAL HIGH (ref 13.2–17.1)
Lymphs Abs: 1323 cells/uL (ref 850–3900)
MCH: 32.2 pg (ref 27.0–33.0)
MCHC: 34.3 g/dL (ref 32.0–36.0)
MCV: 93.9 fL (ref 80.0–100.0)
MPV: 10.9 fL (ref 7.5–12.5)
Monocytes Relative: 9.8 %
Neutro Abs: 3048 cells/uL (ref 1500–7800)
Neutrophils Relative %: 62.2 %
Platelets: 122 10*3/uL — ABNORMAL LOW (ref 140–400)
RBC: 5.37 10*6/uL (ref 4.20–5.80)
RDW: 13.3 % (ref 11.0–15.0)
Total Lymphocyte: 27 %
WBC: 4.9 10*3/uL (ref 3.8–10.8)

## 2019-09-27 LAB — LIPID PANEL
Cholesterol: 139 mg/dL (ref <200)
HDL: 50 mg/dL (ref >=40)
LDL Cholesterol (Calc): 74 mg/dL (calc)
Non-HDL Cholesterol (Calc): 89 mg/dL (calc) (ref <130)
Total CHOL/HDL Ratio: 2.8 (calc) (ref <5.0)
Triglycerides: 73 mg/dL (ref <150)

## 2019-09-27 LAB — COMPREHENSIVE METABOLIC PANEL
AG Ratio: 2 (calc) (ref 1.0–2.5)
ALT: 29 U/L (ref 9–46)
AST: 21 U/L (ref 10–35)
Albumin: 4.3 g/dL (ref 3.6–5.1)
Alkaline phosphatase (APISO): 53 U/L (ref 35–144)
BUN: 14 mg/dL (ref 7–25)
CO2: 31 mmol/L (ref 20–32)
Calcium: 9.7 mg/dL (ref 8.6–10.3)
Chloride: 103 mmol/L (ref 98–110)
Creat: 0.91 mg/dL (ref 0.70–1.18)
Globulin: 2.1 g/dL (calc) (ref 1.9–3.7)
Glucose, Bld: 152 mg/dL — ABNORMAL HIGH (ref 65–99)
Potassium: 4.7 mmol/L (ref 3.5–5.3)
Sodium: 139 mmol/L (ref 135–146)
Total Bilirubin: 0.9 mg/dL (ref 0.2–1.2)
Total Protein: 6.4 g/dL (ref 6.1–8.1)

## 2019-09-27 LAB — MICROALBUMIN / CREATININE URINE RATIO
Creatinine, Urine: 91 mg/dL (ref 20–320)
Microalb Creat Ratio: 7 mcg/mg creat (ref <30)
Microalb, Ur: 0.6 mg/dL

## 2019-10-06 ENCOUNTER — Encounter: Payer: Self-pay | Admitting: Family Medicine

## 2019-10-16 ENCOUNTER — Encounter: Payer: Self-pay | Admitting: Internal Medicine

## 2019-10-16 ENCOUNTER — Ambulatory Visit (INDEPENDENT_AMBULATORY_CARE_PROVIDER_SITE_OTHER): Payer: No Typology Code available for payment source | Admitting: Internal Medicine

## 2019-10-16 ENCOUNTER — Other Ambulatory Visit: Payer: Self-pay

## 2019-10-16 VITALS — BP 130/88 | HR 82 | Ht 68.0 in | Wt 188.0 lb

## 2019-10-16 DIAGNOSIS — Z794 Long term (current) use of insulin: Secondary | ICD-10-CM | POA: Diagnosis not present

## 2019-10-16 DIAGNOSIS — E663 Overweight: Secondary | ICD-10-CM | POA: Diagnosis not present

## 2019-10-16 DIAGNOSIS — E11649 Type 2 diabetes mellitus with hypoglycemia without coma: Secondary | ICD-10-CM | POA: Diagnosis not present

## 2019-10-16 DIAGNOSIS — E785 Hyperlipidemia, unspecified: Secondary | ICD-10-CM

## 2019-10-16 LAB — POCT GLYCOSYLATED HEMOGLOBIN (HGB A1C): Hemoglobin A1C: 7.3 % — AB (ref 4.0–5.6)

## 2019-10-16 NOTE — Patient Instructions (Addendum)
Please continue: - Metformin ER 500 mg with dinner  Please add: - Glipizide 2.5-5 mg before a larger meal or if you have dessert  Please increase: - Basaglar 16 units at bedtime  Please return in 4 months with your sugar log.

## 2019-10-16 NOTE — Addendum Note (Signed)
Addended by: Cardell Peach I on: 10/16/2019 03:57 PM   Modules accepted: Orders

## 2019-10-16 NOTE — Progress Notes (Signed)
Patient ID: David Shenberger [name], male   DOB: Apr 04, 1945, 74 y.o.   MRN: [id number]   This visit occurred during the SARS-CoV-2 public health emergency.  Safety protocols were in place, including screening questions prior to the visit, additional usage of staff PPE, and extensive cleaning of exam room while observing appropriate contact time as indicated for disinfecting solutions.   HPI: David Carr. is a 74 y.o.-year-old male, returning for f/u for DM2, dx at the end of the 1990s, insulin-dependent since ~2014, uncontrolled, without long term complications. Last visit 4 months ago.  Reviewed HbA1c levels: Lab Results  Component Value Date   HGBA1C 7.2 (A) 06/10/2019   HGBA1C 6.7 (A) 02/11/2019   HGBA1C 6.8 (A) 10/07/2018   He is on: - Metformin ER [id number] mg with dinner >> 500 mg with dinner (she had some dizziness and shortness of breath with higher doses) --started at last visit but he did not use it yet - Basaglar 14 units at bedtime He stopped Jardiance 10 mg b/c increased urination and dehydration (felt like being drunk). He was on 45 units of Humalog 75/25 at bedtime in the past.  Pt checks his sugars twice a day: - am: 116-140, 150 >> 90's, 102-136 >> 88, 108-135 >> 121-141, 151, 182, 212 - 2h after b'fast: 126, 128 >> n/c  >> 120, 181,  254 >> n/c >> 143-188 - before lunch: 98-125 >> 120-150 >> 101-125, 146 >> 127-150 - 2h after lunch:  n/c >> 117-181 >> n/c >> 172, 194 >> 145-190, 212 - before dinner: 94-124, 174, 182 (grapes) >> ? >> 120, 146 >> 106-120 - 2h after dinner:120-192 >> ?, 220 >> 154-213, 236 >> 128-190, 218, 226 - bedtime: 125 >> n/c >> 144-168, 223 >> see above >> 147 - nighttime: n/c Lowest sugar was: 70s x2 >> ...>> 88.  He has hypoglycemia awareness in the 80s He had an episode of loss of consciousness when sugars reached 60 in 07/17/2016.  Highest sugar was 306 >> 236.  Glucometer: Freestyle Lite  Pt's meals are: - Breakfast: coffee + nab  or slice pound cake + pop tarts - Lunch: fast food or sandwich or skips - Dinner: meat + veggies  - Snacks: 1, before bedtime  He continues to walk for exercise.  -No CKD, last BUN/creatinine:  Lab Results  Component Value Date   BUN 14 09/26/2019   BUN 20 02/11/2019   CREATININE 0.91 09/26/2019   CREATININE 1.14 02/11/2019   -+ HL; last set of lipids: Lab Results  Component Value Date   CHOL 139 09/26/2019   HDL 50 09/26/2019   LDLCALC 74 09/26/2019   LDLDIRECT 74.4 06/19/2006   TRIG 73 09/26/2019   CHOLHDL 2.8 09/26/2019  On Zocor - last eye exam was in 04/2019: No DR. Has another exam today. -He denies numbness and tingling in his feet.  He has AML in remission.  He was discharged by Dr. Alvy Bimler from the oncology clinic due to persistent remission.  He was found to have multiple PVCs and his atenolol dose was increased before last visit.  ROS: Constitutional: no weight gain/no weight loss, no fatigue, no subjective hyperthermia, no subjective hypothermia Eyes: no blurry vision, no xerophthalmia ENT: no sore throat, no nodules palpated in neck, no dysphagia, no odynophagia, no hoarseness Cardiovascular: no CP/no SOB/no palpitations/no leg swelling Respiratory: no cough/no SOB/no wheezing Gastrointestinal: no N/no V/no D/no C/no acid reflux Musculoskeletal: no muscle aches/no joint aches Skin: no rashes, no  hair loss, + right upper eyelid is erythematous and slightly edematous Neurological: no tremors/no numbness/no tingling/no dizziness  I reviewed pt's medications, allergies, PMH, social hx, family hx, and changes were documented in the history of present illness. Otherwise, unchanged from my initial visit note.  Past Medical History:  Diagnosis Date  . AML (acute myeloid leukemia) in remission [facility]) oncologist-  dr Alvy Bimler-- per last note 10/ 2017 in remission 2 years   dx 09-11-2013  via bone marrow bx , FLT3 negative (NP M1 +)/  chemotherapy started 09-17-2013,   remission via marrow bx 11-06-2013,  4 cycles consolidation chemo w/ HiDAC 11-13-2013 to 03-05-2014  . BPH with urinary obstruction   . Chronic thrombocytopenic purpura ([place])   . Dental caries    periodontitis  . GERD (gastroesophageal reflux disease)   . History of kidney stones   . Hyperlipidemia   . Hypertension   . MVP (mitral valve prolapse)   . Pancytopenia, acquired ([place])   . 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 Severe mitral regurgitation   . Type 2 diabetes mellitus with hypoglycemia, with long-term current use of insulin ([facility]    endocrinologist-  dr gCon Memos . Wears denture    upper   Past Surgical History:  Procedure Laterality Date  . CARDIAC CATHETERIZATION    . CARDIOVASCULAR STRESS TEST  07/23/2013   Low risk nuclear study w/ mild inferior ischemia/  normal LV function and wall motion, ef 69%  . CYSTOSCOPY WITH RETROGRADE PYELOGRAM, URETEROSCOPY AND STENT PLACEMENT Right 04/20/2016   Procedure: CYSTOSCOPY WITH RETROGRADE PYELOGRAM, URETEROSCOPY , STONE BASKETRY AND STENT PLACEMENT;  Surgeon: SFranchot Gallo MD;  [facility]  Service: Urology;  Laterality: Right;  . EXTRACORPOREAL SHOCK WAVE LITHOTRIPSY  yrs ago  . HOLMIUM LASER APPLICATION Right 36/03/8313  Procedure: HOLMIUM LASER APPLICATION;  Surgeon: SFranchot Gallo MD;  [facility]  Service: Urology;  Laterality: Right;  . INGUINAL HERNIA REPAIR Left 1990's  . MITRAL VALVE REPAIR N/A 06/21/2016   Procedure: MINIMALLY INVASIVE MITRAL VALVE REPAIR (MVR) USING SORIN MEMO 3D SIZE 32 SEMIRIGID ANNULOPLASTY RING;  Surgeon: ORexene Alberts MD;  [place] [facility]  Service: Open Heart Surgery;  Laterality: N/A;  . MULTIPLE EXTRACTIONS WITH  ALVEOLOPLASTY N/A 06/08/2016   Procedure: MULTIPLE EXTRACTION WITH ALVEOLOPLASTY AND PRE-PROSTHETIC SURGERY AS NEEDED;  Surgeon: RLenn Cal DDS;  [place] [facility]  Service: Oral Surgery;  Laterality: N/A;  . PATENT FORAMEN OVALE(PFO) CLOSURE N/A 06/21/2016   Procedure: PATENT FORAMEN OVALE (PFO) CLOSURE;  Surgeon: ORexene Alberts MD;  [place] [facility]  Service: Open Heart Surgery;  Laterality: N/A;  . RIGHT/LEFT HEART CATH AND CORONARY ANGIOGRAPHY N/A 06/07/2016   Procedure: Right/Left Heart Cath and Coronary Angiography;  Surgeon: MSherren Mocha MD;  [facility];  Service: Cardiovascular;  Laterality: N/A;  . ROTATOR CUFF REPAIR Right [id number] . TEE WITHOUT CARDIOVERSION N/A 01/11/2016   Procedure: TRANSESOPHAGEAL ECHOCARDIOGRAM (TEE);  Surgeon: KPixie Casino MD;  [facility];  Service: Cardiovascular;  Laterality: N/A; Severe MR associated w/ flail P2 segment of MV;  no LAA thromus;  small PFO by saline microbubble contrast after valsalva; LVEF 55-60%  . TEE WITHOUT CARDIOVERSION N/A 06/21/2016   Procedure: TRANSESOPHAGEAL ECHOCARDIOGRAM (TEE);  Surgeon: ODarylene Price  H, MD;  Location: [place];  Service: Open Heart Surgery;  Laterality: N/A;  . TENNIS ELBOW RELEASE/NIRSCHEL PROCEDURE Right 1990's  . TRANSTHORACIC ECHOCARDIOGRAM  12/22/2015   grade 1 diastolic dysfunction,  ef 55-60%/  moderate MVP involving posterior leaflet w/ partial flail and severe MR via CW doppler (peak gradient 87mHg)/  trivial TR   Social History   Social History  . Marital status: Married    Spouse name: N/A  . Number of children: 2   Occupational History  . accountant   Social History Main Topics  . Smoking status: Former SResearch scientist (life sciences) . Smokeless tobacco: Never Used  . Alcohol use No  . Drug use: No   Current Outpatient Medications on File Prior to Visit  Medication Sig Dispense Refill  . ACCU-CHEK GUIDE test strip USE TO CHECK BLOOD SUGAR 3 TIMES A DAY 300 strip 12  .  acetaminophen (TYLENOL) 500 MG tablet Take 2 tablets (1,000 mg total) by mouth every 6 (six) hours as needed. 30 tablet 0  . aspirin 81 MG tablet Take 81 mg by mouth as needed.     .Marland Kitchenatenolol (TENORMIN) 25 MG tablet Take 1 tablet (25 mg total) by mouth daily. 90 tablet 2  . glipiZIDE (GLUCOTROL) 5 MG tablet Take 0.5-1 tablet before a larger meal 30 tablet 5  . Insulin Glargine (BASAGLAR KWIKPEN) 100 UNIT/ML INJECT 14-16 UNITS INTO THE SKIN AT BEDTIME 10 pen 3  . Insulin Pen Needle (BD PEN NEEDLE NANO U/F) 32G X 4 MM MISC USE 4 TIMES A DAY 300 each 3  . ipratropium (ATROVENT) 0.06 % nasal spray Place 2 sprays into both nostrils 4 (four) times daily. 15 mL 4  . Lancets (ACCU-CHEK MULTICLIX) lancets Use as instructed to check blood sugar 3 times a day. 300 each 12  . metFORMIN (GLUCOPHAGE-XR) 500 MG 24 hr tablet TAKE 1 TABLET BY MOUTH DAILY WITH SUPPER 90 tablet 3  . simvastatin (ZOCOR) 20 MG tablet TAKE 1 TABLET BY MOUTH EVERYDAY AT BEDTIME 90 tablet 1   Current Facility-Administered Medications on File Prior to Visit  Medication Dose Route Frequency Provider Last Rate Last Admin  . heparin lock flush 100 unit/mL  500 Units Intravenous Once KBrunetta Genera MD      . sodium chloride 0.9 % injection 10 mL  10 mL Intravenous PRN KBrunetta Genera MD        Allergies  Allergen Reactions  . Prochlorperazine Other (See Comments)    ARRHYTHMIAS  . Morphine And Related     Shuts bladder down.   Family History  Problem Relation Age of Onset  . Heart attack Father   . Alcoholism Father   . Obesity Father   . Asthma Sister   . Cancer Daughter        carcinoid tumor   PE: BP 130/88   Pulse 82   Ht _0  (1.727 m)   Wt 188 lb (85.3 kg)   SpO2 96%   BMI 28.59 kg/m  Wt Readings from Last 3 Encounters:  10/16/19 188 lb (85.3 kg)  09/26/19 187 lb 11.2 oz (85.1 kg)  06/10/19 186 lb (84.4 kg)   Constitutional: overweight, in NAD Eyes: PERRLA, EOMI, no exophthalmos ENT: moist  mucous membranes, no thyromegaly, no cervical lymphadenopathy Cardiovascular: RRR, No MRG Respiratory: CTA B Gastrointestinal: abdomen soft, NT, ND, BS+ Musculoskeletal: no deformities, strength intact in all 4 Skin: moist, warm, no rashes Neurological: no tremor with outstretched hands, DTR normal in all  4  ASSESSMENT: 1. DM2, insulin-dependent, uncontrolled, without Long term complications, but with hypoglycemia  2. HL  3.  Overweight  PLAN:  1. Patient with longstanding, previously uncontrolled type 2 diabetes, on low-dose Metformin and long-acting insulin, to which we added a low-dose glipizide as needed before a large meal as some of his blood sugars after meals are high after dietary indiscretions. At last visit he was occasionally eating pancakes or cereals at night, and we discussed about stopping these. At that time, his HbA1c was slightly higher, at 7.2%. -Of note, he could not tolerate higher doses of Metformin and he was also on Jardiance in the past but had to stop due to increased urination and UTI. -At this visit, sugars are little bit higher compared to the ones in 04-06/2019.  No lows. He does have several blood sugars in the 200s after dinner.  Upon questioning, he is having small meals for breakfast and lunch and has a larger, early dinner, around 3-4 PM.  He didn't snacks before bedtime.  We discussed about reducing snacking and I also encouraged him to go ahead and take the glipizide before a larger meal.  He did not do so since last visit.  We'll start with half a tablet and may need a whole tablet before a larger meal.  We'll also go ahead and increase the Basaglar slightly to hopefully improve the morning sugars. -I advised him to: Patient Instructions  Please continue: - Metformin ER 500 mg with dinner  Please add: - Glipizide 2.5-5 mg before a larger meal or if you have dessert  Please increase: - Basaglar 16 units at bedtime  Please return in 4 months with  your sugar log.   - we checked his HbA1c: 7.3% (slightly higher) - advised to check sugars at different times of the day - 1x a day, rotating check times - advised for yearly eye exams >> he is UTD - return to clinic in 3-4 months    2. HL -Reviewed latest lipid panel from 09/2019: Fractions at goal: Lab Results  Component Value Date   CHOL 139 09/26/2019   HDL 50 09/26/2019   LDLCALC 74 09/26/2019   LDLDIRECT 74.4 06/19/2006   TRIG 73 09/26/2019   CHOLHDL 2.8 09/26/2019  -Continues Zocor without side effects  3.  Overweight -Unfortunately, we had to stop Jardiance, which was helping with weight loss, also -We will continue Metformin which is a mild appetite suppressant effect -No weight loss since last visit, he gained 2 lbs since then  Philemon Kingdom, MD PhD [facility] Endocrinology

## 2019-11-29 ENCOUNTER — Ambulatory Visit: Payer: No Typology Code available for payment source | Attending: Internal Medicine

## 2019-11-29 DIAGNOSIS — Z23 Encounter for immunization: Secondary | ICD-10-CM

## 2019-11-29 NOTE — Progress Notes (Signed)
   [id number]  Name:  David Carr.    MRN: [id number] DOB: Aug 17, 1945  11/29/2019  [name] was observed post Covid-19 immunization for 15 minutes without incident. He was provided with Vaccine Information Sheet and instruction to access the V-Safe system.   [name] was instructed to call 911 with any severe reactions post vaccine: Marland Kitchen Difficulty breathing  . Swelling of face and throat  . A fast heartbeat  . A bad rash all over body  . Dizziness and weakness

## 2019-12-16 ENCOUNTER — Other Ambulatory Visit: Payer: Self-pay

## 2019-12-16 DIAGNOSIS — I10 Essential (primary) hypertension: Secondary | ICD-10-CM

## 2019-12-16 MED ORDER — ATENOLOL 25 MG PO TABS: 25.0000 mg | ORAL_TABLET | Freq: Every day | ORAL | 2 refills | Status: DC

## 2019-12-16 NOTE — Telephone Encounter (Signed)
This is Dr. Hochrein's pt. °

## 2020-01-12 ENCOUNTER — Other Ambulatory Visit: Payer: Self-pay

## 2020-01-12 ENCOUNTER — Telehealth: Payer: Self-pay | Admitting: Family Medicine

## 2020-01-12 DIAGNOSIS — E7800 Pure hypercholesterolemia, unspecified: Secondary | ICD-10-CM

## 2020-01-12 MED ORDER — SIMVASTATIN 20 MG PO TABS: ORAL_TABLET | ORAL | 1 refills | Status: DC

## 2020-01-12 NOTE — Telephone Encounter (Signed)
Patient is calling and requesting a refill for simvastatin (ZOCOR) 20 MG tablet sent to [id number] [address], [place], [place] [address]  Phone:  [phone] Fax:  [phone]  CB is [phone]

## 2020-02-05 ENCOUNTER — Other Ambulatory Visit: Payer: Self-pay

## 2020-02-05 ENCOUNTER — Ambulatory Visit: Payer: No Typology Code available for payment source | Admitting: Internal Medicine

## 2020-02-05 ENCOUNTER — Encounter: Payer: Self-pay | Admitting: Internal Medicine

## 2020-02-05 VITALS — BP 130/90 | HR 80 | Ht 68.0 in | Wt 194.2 lb

## 2020-02-05 DIAGNOSIS — Z23 Encounter for immunization: Secondary | ICD-10-CM | POA: Diagnosis not present

## 2020-02-05 DIAGNOSIS — E785 Hyperlipidemia, unspecified: Secondary | ICD-10-CM

## 2020-02-05 DIAGNOSIS — E11649 Type 2 diabetes mellitus with hypoglycemia without coma: Secondary | ICD-10-CM | POA: Diagnosis not present

## 2020-02-05 DIAGNOSIS — Z794 Long term (current) use of insulin: Secondary | ICD-10-CM | POA: Diagnosis not present

## 2020-02-05 DIAGNOSIS — E663 Overweight: Secondary | ICD-10-CM

## 2020-02-05 LAB — POCT GLYCOSYLATED HEMOGLOBIN (HGB A1C): Hemoglobin A1C: 6.5 % — AB (ref 4.0–5.6)

## 2020-02-05 MED ORDER — BASAGLAR KWIKPEN 100 UNIT/ML ~~LOC~~ SOPN: 16.0000 [IU] | PEN_INJECTOR | Freq: Every day | SUBCUTANEOUS | 3 refills | Status: DC

## 2020-02-05 MED ORDER — METFORMIN HCL ER 500 MG PO TB24: ORAL_TABLET | ORAL | 3 refills | Status: DC

## 2020-02-05 MED ORDER — GLIPIZIDE 5 MG PO TABS: ORAL_TABLET | ORAL | 3 refills | Status: DC

## 2020-02-05 NOTE — Progress Notes (Signed)
Patient ID: David Carr., male   DOB: 05/07/1945, 74 y.o.   MRN: [id number]   This visit occurred during the SARS-CoV-2 public health emergency.  Safety protocols were in place, including screening questions prior to the visit, additional usage of staff PPE, and extensive cleaning of exam room while observing appropriate contact time as indicated for disinfecting solutions.   HPI: David Carr. is a 74 y.o.-year-old male, returning for f/u for DM2, dx at the end of the 1990s, insulin-dependent since ~2014, uncontrolled, without long term complications. Last visit 3.5 months ago.  Reviewed HbA1c levels: Lab Results  Component Value Date   HGBA1C 7.3 (A) 10/16/2019   HGBA1C 7.2 (A) 06/10/2019   HGBA1C 6.7 (A) 02/11/2019   HGBA1C 6.8 (A) 10/07/2018   HGBA1C 6.8 (A) 05/09/2018   HGBA1C 6.7 (A) 01/08/2018   HGBA1C 6.5 (A) 09/06/2017   HGBA1C 7.2 06/05/2017   HGBA1C 6.9 03/02/2017   HGBA1C 7.5 (H) 11/07/2016   HGBA1C 6.6 08/29/2016   HGBA1C 6.4 (H) 06/19/2016   HGBA1C 7.0 05/09/2016   HGBA1C 7.5 (H) 01/18/2016   HGBA1C 7.8 (H) 10/21/2015   HGBA1C 8.0 (H) 04/15/2015   HGBA1C 7.5 (H) 01/14/2015   HGBA1C 7.2 (H) 10/16/2014   HGBA1C 7.0 (H) 04/16/2014   HGBA1C 7.2 (H) 08/22/2013   He is on - Metformin ER [id number] mg with dinner >> 500 mg with dinner (he had dizziness and shortness of breath with higher doses) - Glipizide 2.5-5 mg before a large meal or if he has dessert >> 2.5 mg before dinner - Basaglar 14 >> 16 units at bedtime He stopped Jardiance 10 mg b/c increased urination and dehydration (felt like being drunk). He was on 45 units of Humalog 75/25 at bedtime in the past.  Pt checks his sugars  twice a day: - am: 88, 108-135 >> 121-141, 151, 182, 212 >> 89-134, 159 - 2h after b'fast: 120, 181,  254 >> n/c >> 143-188 >> 137-164 - before lunch: 101-125, 146 >> 127-150 >> 99-143, 159 - 2h after lunch: 172, 194 >> 145-190, 212 >> 158-177 - before dinner:  ? >> 120,  146 >> 106-120 >> 86-151, 170, 182 - 2h after dinner: 154-213, 236 >> 128-190, 218, 226 >>73,  89-125 - bedtime144-168, 223 >> see above >> 147 >> 82-138, 200 - nighttime: n/c Lowest sugar was: 70s x2 >> ...>> 88  >> 64.  He has hypoglycemia awareness in the 80s. He had an episode of loss of consciousness when sugars reached 60 in 07/17/2016.  Highest sugar was 306 >> 236 >> 200.  Glucometer: Freestyle Lite  Pt's meals are: - Breakfast: coffee + nab or slice pound cake + pop tarts - Lunch: fast food or sandwich or skips - Dinner: meat + veggies  - Snacks: 1, before bedtime  He continues to walk for exercise.  -No CKD, last BUN/creatinine:  Lab Results  Component Value Date   BUN 14 09/26/2019   BUN 20 02/11/2019   CREATININE 0.91 09/26/2019   CREATININE 1.14 02/11/2019   -+ HL; last set of lipids: Lab Results  Component Value Date   CHOL 139 09/26/2019   HDL 50 09/26/2019   LDLCALC 74 09/26/2019   LDLDIRECT 74.4 06/19/2006   TRIG 73 09/26/2019   CHOLHDL 2.8 09/26/2019  On Zocor 20. - last eye exam was in 04/2019: No DR. -No numbness and tingling in his feet.  He has AML in remission.  He was discharged by Dr. Alvy Bimler  from the oncology clinic due to persistent remission.  He was found to have multiple PVCs and his atenolol dose was increased before last visit.  ROS: Constitutional: + weight gain/no weight loss, no fatigue, no subjective hyperthermia, no subjective hypothermia Eyes: no blurry vision, no xerophthalmia ENT: no sore throat, no nodules palpated in neck, no dysphagia, no odynophagia, no hoarseness Cardiovascular: no CP/no SOB/no palpitations/no leg swelling Respiratory: no cough/no SOB/no wheezing Gastrointestinal: no N/no V/no D/no C/no acid reflux Musculoskeletal: no muscle aches/no joint aches Skin: no rashes, no hair loss Neurological: no tremors/no numbness/no tingling/no dizziness  I reviewed pt's medications, allergies, PMH, social hx, family hx,  and changes were documented in the history of present illness. Otherwise, unchanged from my initial visit note.  Past Medical History:  Diagnosis Date  . AML (acute myeloid leukemia) in remission [facility]) oncologist-  dr Alvy Bimler-- per last note 10/ 2017 in remission 2 years   dx 09-11-2013  via bone marrow bx , FLT3 negative (NP M1 +)/  chemotherapy started 09-17-2013,  remission via marrow bx 11-06-2013,  4 cycles consolidation chemo w/ HiDAC 11-13-2013 to 03-05-2014  . BPH with urinary obstruction   . Chronic thrombocytopenic purpura ([place])   . Dental caries    periodontitis  . GERD (gastroesophageal reflux disease)   . History of kidney stones   . Hyperlipidemia   . Hypertension   . MVP (mitral valve prolapse)   . Pancytopenia, acquired ([place])   . 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 Severe mitral regurgitation   . Type 2 diabetes mellitus with hypoglycemia, with long-term current use of insulin ([facility]    endocrinologist-  dr gCon Memos . Wears denture    upper   Past Surgical History:  Procedure Laterality Date  . CARDIAC CATHETERIZATION    . CARDIOVASCULAR STRESS TEST  07/23/2013   Low risk nuclear study w/ mild inferior ischemia/  normal LV function and wall motion, ef 69%  . CYSTOSCOPY WITH RETROGRADE PYELOGRAM, URETEROSCOPY AND STENT PLACEMENT Right 04/20/2016   Procedure: CYSTOSCOPY WITH RETROGRADE PYELOGRAM, URETEROSCOPY , STONE BASKETRY AND STENT PLACEMENT;  Surgeon: SFranchot Gallo MD;  [facility]  Service: Urology;  Laterality: Right;  . EXTRACORPOREAL SHOCK WAVE LITHOTRIPSY  yrs ago  . HOLMIUM LASER APPLICATION Right 38/04/2547  Procedure: HOLMIUM LASER APPLICATION;  Surgeon: SFranchot Gallo MD;  [facility]  Service: Urology;  Laterality: Right;  . INGUINAL HERNIA REPAIR Left 1990's  . MITRAL VALVE REPAIR N/A 06/21/2016   Procedure: MINIMALLY INVASIVE MITRAL VALVE REPAIR (MVR) USING SORIN MEMO 3D SIZE 32 SEMIRIGID ANNULOPLASTY RING;  Surgeon: ORexene Alberts MD;  [facility]  Service: Open Heart Surgery;  Laterality: N/A;  . MULTIPLE EXTRACTIONS WITH ALVEOLOPLASTY N/A 06/08/2016   Procedure: MULTIPLE EXTRACTION WITH ALVEOLOPLASTY AND PRE-PROSTHETIC SURGERY AS NEEDED;  Surgeon: RLenn Cal DDS;  [facility]  Service: Oral Surgery;  Laterality: N/A;  . PATENT FORAMEN OVALE(PFO) CLOSURE N/A 06/21/2016   Procedure: PATENT FORAMEN OVALE (PFO) CLOSURE;  Surgeon: ORexene Alberts MD;  [facility]  Service: Open Heart Surgery;  Laterality: N/A;  . RIGHT/LEFT HEART CATH AND CORONARY ANGIOGRAPHY N/A 06/07/2016   Procedure: Right/Left Heart Cath and Coronary Angiography;  Surgeon: MSherren Mocha MD;  [facility];  Service: Cardiovascular;  Laterality: N/A;  . ROTATOR CUFF REPAIR Right [id number]  . TEE WITHOUT CARDIOVERSION N/A 01/11/2016   Procedure: TRANSESOPHAGEAL ECHOCARDIOGRAM (TEE);  Surgeon: Pixie Casino, MD;  [facility] ENDOSCOPY;  Service: Cardiovascular;  Laterality: N/A; Severe MR associated w/ flail P2 segment of MV;  no LAA thromus;  small PFO by saline microbubble contrast after valsalva; LVEF 55-60%  . TEE WITHOUT CARDIOVERSION N/A 06/21/2016   Procedure: TRANSESOPHAGEAL ECHOCARDIOGRAM (TEE);  Surgeon: Rexene Alberts, MD;  Location: [facility];  Service: Open Heart Surgery;  Laterality: N/A;  . TENNIS ELBOW RELEASE/NIRSCHEL PROCEDURE Right 1990's  . TRANSTHORACIC ECHOCARDIOGRAM  12/22/2015   grade 1 diastolic dysfunction,  ef 55-60%/  moderate MVP involving posterior leaflet w/ partial flail and severe MR via CW doppler (peak gradient 30mHg)/  trivial TR   Social History   Social History  . Marital status: Married    Spouse name: N/A  .  Number of children: 2   Occupational History  . accountant   Social History Main Topics  . Smoking status: Former SResearch scientist (life sciences) . Smokeless tobacco: Never Used  . Alcohol use No  . Drug use: No   Current Outpatient Medications on File Prior to Visit  Medication Sig Dispense Refill  . ACCU-CHEK GUIDE test strip USE TO CHECK BLOOD SUGAR 3 TIMES A DAY 300 strip 12  . acetaminophen (TYLENOL) 500 MG tablet Take 2 tablets (1,000 mg total) by mouth every 6 (six) hours as needed. 30 tablet 0  . aspirin 81 MG tablet Take 81 mg by mouth as needed.     .Marland Kitchenatenolol (TENORMIN) 25 MG tablet Take 1 tablet (25 mg total) by mouth daily. 90 tablet 2  . glipiZIDE (GLUCOTROL) 5 MG tablet Take 0.5-1 tablet before a larger meal 30 tablet 5  . Insulin Glargine (BASAGLAR KWIKPEN) 100 UNIT/ML INJECT 14-16 UNITS INTO THE SKIN AT BEDTIME (Patient taking differently: 14 Units. INJECT 14-16 UNITS INTO THE SKIN AT BEDTIME) 10 pen 3  . Insulin Pen Needle (BD PEN NEEDLE NANO U/F) 32G X 4 MM MISC USE 4 TIMES A DAY 300 each 3  . ipratropium (ATROVENT) 0.06 % nasal spray Place 2 sprays into both nostrils 4 (four) times daily. 15 mL 4  . Lancets (ACCU-CHEK MULTICLIX) lancets Use as instructed to check blood sugar 3 times a day. 300 each 12  . metFORMIN (GLUCOPHAGE-XR) 500 MG 24 hr tablet TAKE 1 TABLET BY MOUTH DAILY WITH SUPPER 90 tablet 3  . simvastatin (ZOCOR) 20 MG tablet TAKE 1 TABLET BY MOUTH EVERYDAY AT BEDTIME 90 tablet 1   Current Facility-Administered Medications on File Prior to Visit  Medication Dose Route Frequency Provider Last Rate Last Admin  . heparin lock flush 100 unit/mL  500 Units Intravenous Once KBrunetta Genera MD      . sodium chloride 0.9 % injection 10 mL  10 mL Intravenous PRN KBrunetta Genera MD        Allergies  Allergen Reactions  . Prochlorperazine Other (See Comments)    ARRHYTHMIAS  . Morphine And Related     Shuts bladder down.   Family History  Problem Relation Age of Onset   . Heart attack Father   . Alcoholism Father   . Obesity Father   . Asthma Sister   . Cancer Daughter        carcinoid tumor   PE: BP 130/90   Pulse 80   Ht _0  (1.727 m)   Wt 194  lb 3.2 oz (88.1 kg)   SpO2 97%   BMI 29.53 kg/m  Wt Readings from Last 3 Encounters:  02/05/20 194 lb 3.2 oz (88.1 kg)  10/16/19 188 lb (85.3 kg)  09/26/19 187 lb 11.2 oz (85.1 kg)   Constitutional: overweight, in NAD Eyes: PERRLA, EOMI, no exophthalmos ENT: moist mucous membranes, no thyromegaly, no cervical lymphadenopathy Cardiovascular: RRR, No MRG Respiratory: CTA B Gastrointestinal: abdomen soft, NT, ND, BS+ Musculoskeletal: no deformities, strength intact in all 4 Skin: moist, warm, no rashes Neurological: no tremor with outstretched hands, DTR normal in all 4  ASSESSMENT: 1. DM2, insulin-dependent, uncontrolled, without Long term complications, but with hypoglycemia  2. HL  3.  Overweight  PLAN:  1. Patient with longstanding, uncontrolled type 2 diabetes, on low-dose Metformin (could not tolerate higher doses), prn sulfonylurea before a larger meal (added at visit), and basal insulin, with a higher HbA1c at last visit 1.3%. At that time, sugars were higher compared to the ones at the previous visit, especially after dinner, when sugars are occasionally in the 200s. He did not have any lows. Upon questioning, he was having small meals for breakfast and lunch and a larger, overly, dinner, around 3-4 PM. He was having snacks before bedtime and I encouraged him to stop these and add glipizide before a larger meal. We also increase Basaglar slightly to improve his morning sugars. -At today's visit, sugars are much better, improved at almost all times of the day, with only few exceptions in the hyperglycemic range.  He also had 1 blood sugar at 64, at night.  He tells me that he is actually taking the labs of glipizide before dinner but does not take the 5 mg anymore.  I agree that he does not  need this, as his sugars after dinner are at goal.  In fact, at today's visit, I do not feel that he needs any changes in his regimen.  I did refill all of his prescriptions. -He has recurrent styes and would be interested to see another ophthalmologist.  I advised him to schedule an appointment with Dr. Prudencio Burly. -I advised him to: Patient Instructions  Please continue: - Metformin ER 500 mg with dinner - Glipizide 2.5 mg before a larger meal or if you have dessert - Basaglar 16 units at bedtime  Please call and schedule an eye appt with Dr. Prudencio Burly: [facility]:  Dr. Sherlyn Lick MD ?  Address: [address], [place], [place] [address]  [phone]  Please return in 4 months with your sugar log.   - we checked his HbA1c: 6.5% (improved) - advised to check sugars at different times of the day - 1-2x a day, rotating check times - advised for yearly eye exams >> he is UTD - return to clinic in 4 months    2. HL -Reviewed latest lipid panel from 09/2019: LDL at goal, as are the rest of his lipid fractions: Lab Results  Component Value Date   CHOL 139 09/26/2019   HDL 50 09/26/2019   LDLCALC 74 09/26/2019   LDLDIRECT 74.4 06/19/2006   TRIG 73 09/26/2019   CHOLHDL 2.8 09/26/2019  -Continues Zocor 20 without side effects  3.  Overweight -Unfortunately, we had to stop Jardiance, which was helping with weight loss, also -We will continue Metformin, which has a mild appetite suppressant effect -He gained 2 pounds before last visit and 6 lbs since last OV  Philemon Kingdom, MD PhD [facility] Endocrinology

## 2020-02-05 NOTE — Addendum Note (Signed)
Addended by: Lauralyn Primes on: 02/05/2020 09:56 AM   Modules accepted: Orders

## 2020-02-05 NOTE — Patient Instructions (Addendum)
Please continue: - Metformin ER 500 mg with dinner - Glipizide 2.5 mg before a larger meal or if you have dessert - Basaglar 16 units at bedtime  Please call and schedule an eye appt with Dr. Prudencio Burly: [facility]:  Dr. Sherlyn Lick MD ?  Address: [address], [place], [place] [address]  [phone]  Please return in 4 months with your sugar log.

## 2020-03-24 ENCOUNTER — Telehealth (INDEPENDENT_AMBULATORY_CARE_PROVIDER_SITE_OTHER): Payer: No Typology Code available for payment source | Admitting: Family Medicine

## 2020-03-24 DIAGNOSIS — R21 Rash and other nonspecific skin eruption: Secondary | ICD-10-CM

## 2020-03-24 MED ORDER — TRIAMCINOLONE ACETONIDE 0.5 % EX CREA: TOPICAL_CREAM | CUTANEOUS | 1 refills | Status: AC

## 2020-03-24 NOTE — Progress Notes (Signed)
Patient ID: David Ciccarelli [name], male   DOB: Jun 26, 1945, 75 y.o.   MRN: [id number]   This visit type was conducted due to national recommendations for restrictions regarding the COVID-19 pandemic in an effort to limit this patient's exposure and mitigate transmission in our community.   Virtual Visit via Video Note  I connected with Trish Fountain on 03/24/20 at  1:45 PM EST by a video enabled telemedicine application and verified that I am speaking with the correct person using two identifiers.  Location patient: home Location provider:work or home office Persons participating in the virtual visit: patient, provider  I discussed the limitations of evaluation and management by telemedicine and the availability of in person appointments. The patient expressed understanding and agreed to proceed.   HPI:  David Carr. presents for skin rash involving the left upper extremity.  He states that he had his Covid booster vaccine in October.  About a week later he noticed some pruritic papules on his left arm near the upper arm and subsequently left volar surface of the wrist.  These have persisted.  No pustules.  No vesicles.  No associated pain.  No fevers or chills.  No generalized rash.  No generalized urticaria.  He has had shingles previously but states this rash is different.  Has not tried any topical steroids such as over-the-counter hydrocortisone cream.  He does have type 2 diabetes managed by endocrinology.  Recent A1c 6.5%. He has moderate pruritus.   ROS: See pertinent positives and negatives per HPI.  Past Medical History:  Diagnosis Date  . AML (acute myeloid leukemia) in remission [facility]) oncologist-  dr Alvy Bimler-- per last note 10/ 2017 in remission 2 years   dx 09-11-2013  via bone marrow bx , FLT3 negative (NP M1 +)/  chemotherapy started 09-17-2013,  remission via marrow bx 11-06-2013,  4 cycles consolidation chemo w/ HiDAC 11-13-2013 to 03-05-2014  . BPH with urinary  obstruction   . Chronic thrombocytopenic purpura ([place])   . Dental caries    periodontitis  . GERD (gastroesophageal reflux disease)   . History of kidney stones   . Hyperlipidemia   . Hypertension   . MVP (mitral valve prolapse)   . Pancytopenia, acquired ([place])   . 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   Complex valvuloplasty including triangular resection of flail segment of posterior leaflet, artificial Gore-tex neochord placement x6 and 33mm Sorin Memo 3D ring annuloplasty via right mini thoracotomy approach  . Severe mitral regurgitation   . Type 2 diabetes mellitus with hypoglycemia, with long-term current use of insulin [facility])    endocrinologist-  dr Con Memos  . Wears denture    upper    Past Surgical History:  Procedure Laterality Date  . CARDIAC CATHETERIZATION    . CARDIOVASCULAR STRESS TEST  07/23/2013   Low risk nuclear study w/ mild inferior ischemia/  normal LV function and wall motion, ef 69%  . CYSTOSCOPY WITH RETROGRADE PYELOGRAM, URETEROSCOPY AND STENT PLACEMENT Right 04/20/2016   Procedure: CYSTOSCOPY WITH RETROGRADE PYELOGRAM, URETEROSCOPY , STONE BASKETRY AND STENT PLACEMENT;  Surgeon: Franchot Gallo, MD;  Location: [facility];  Service: Urology;  Laterality: Right;  . EXTRACORPOREAL SHOCK WAVE LITHOTRIPSY  yrs ago  . HOLMIUM LASER APPLICATION Right 09/14/4233   Procedure: HOLMIUM LASER APPLICATION;  Surgeon: Franchot Gallo, MD;  Location: [facility];  Service: Urology;  Laterality: Right;  . INGUINAL HERNIA REPAIR Left 1990's  . MITRAL VALVE REPAIR N/A 06/21/2016  Procedure: MINIMALLY INVASIVE MITRAL VALVE REPAIR (MVR) USING SORIN MEMO 3D SIZE 32 SEMIRIGID ANNULOPLASTY RING;  Surgeon: Rexene Alberts, MD;  Location: [place];  Service: Open Heart Surgery;  Laterality: N/A;  . MULTIPLE EXTRACTIONS WITH ALVEOLOPLASTY N/A 06/08/2016   Procedure: MULTIPLE EXTRACTION WITH ALVEOLOPLASTY AND PRE-PROSTHETIC SURGERY AS NEEDED;   Surgeon: Lenn Cal, DDS;  Location: [place];  Service: Oral Surgery;  Laterality: N/A;  . PATENT FORAMEN OVALE(PFO) CLOSURE N/A 06/21/2016   Procedure: PATENT FORAMEN OVALE (PFO) CLOSURE;  Surgeon: Rexene Alberts, MD;  Location: [place];  Service: Open Heart Surgery;  Laterality: N/A;  . RIGHT/LEFT HEART CATH AND CORONARY ANGIOGRAPHY N/A 06/07/2016   Procedure: Right/Left Heart Cath and Coronary Angiography;  Surgeon: Sherren Mocha, MD;  Location: [facility];  Service: Cardiovascular;  Laterality: N/A;  . ROTATOR CUFF REPAIR Right [id number]  . TEE WITHOUT CARDIOVERSION N/A 01/11/2016   Procedure: TRANSESOPHAGEAL ECHOCARDIOGRAM (TEE);  Surgeon: Pixie Casino, MD;  Location: [facility] ENDOSCOPY;  Service: Cardiovascular;  Laterality: N/A; Severe MR associated w/ flail P2 segment of MV;  no LAA thromus;  small PFO by saline microbubble contrast after valsalva; LVEF 55-60%  . TEE WITHOUT CARDIOVERSION N/A 06/21/2016   Procedure: TRANSESOPHAGEAL ECHOCARDIOGRAM (TEE);  Surgeon: Rexene Alberts, MD;  Location: [facility];  Service: Open Heart Surgery;  Laterality: N/A;  . TENNIS ELBOW RELEASE/NIRSCHEL PROCEDURE Right 1990's  . TRANSTHORACIC ECHOCARDIOGRAM  12/22/2015   grade 1 diastolic dysfunction,  ef 55-60%/  moderate MVP involving posterior leaflet w/ partial flail and severe MR via CW doppler (peak gradient 22mmHg)/  trivial TR    Family History  Problem Relation Age of Onset  . Heart attack Father   . Alcoholism Father   . Obesity Father   . Asthma Sister   . Cancer Daughter        carcinoid tumor    SOCIAL HX: Quit smoking 1976   Current Outpatient Medications:  .  ACCU-CHEK GUIDE test strip, USE TO CHECK BLOOD SUGAR 3 TIMES A DAY, Disp: 300 strip, Rfl: 12 .  acetaminophen (TYLENOL) 500 MG tablet, Take 2 tablets (1,000 mg total) by mouth every 6 (six) hours as needed., Disp: 30 tablet, Rfl: 0 .  aspirin 81 MG tablet, Take 81 mg by mouth as needed. , Disp: , Rfl:  .  atenolol  (TENORMIN) 25 MG tablet, Take 1 tablet (25 mg total) by mouth daily., Disp: 90 tablet, Rfl: 2 .  glipiZIDE (GLUCOTROL) 5 MG tablet, Take 0.5-1 tablet before a larger meal, Disp: 45 tablet, Rfl: 3 .  Insulin Glargine (BASAGLAR KWIKPEN) 100 UNIT/ML, Inject 16-18 Units into the skin at bedtime. INJECT 14-16 UNITS INTO THE SKIN AT BEDTIME, Disp: 30 mL, Rfl: 3 .  Insulin Pen Needle (BD PEN NEEDLE NANO U/F) 32G X 4 MM MISC, USE 4 TIMES A DAY, Disp: 300 each, Rfl: 3 .  ipratropium (ATROVENT) 0.06 % nasal spray, Place 2 sprays into both nostrils 4 (four) times daily., Disp: 15 mL, Rfl: 4 .  Lancets (ACCU-CHEK MULTICLIX) lancets, Use as instructed to check blood sugar 3 times a day., Disp: 300 each, Rfl: 12 .  metFORMIN (GLUCOPHAGE-XR) 500 MG 24 hr tablet, TAKE 1 TABLET BY MOUTH DAILY WITH SUPPER, Disp: 90 tablet, Rfl: 3 .  simvastatin (ZOCOR) 20 MG tablet, TAKE 1 TABLET BY MOUTH EVERYDAY AT BEDTIME, Disp: 90 tablet, Rfl: 1 No current facility-administered medications for this visit.  Facility-Administered Medications Ordered in Other Visits:  .  heparin  lock flush 100 unit/mL, 500 Units, Intravenous, Once, Kale, Cloria Spring, MD .  sodium chloride 0.9 % injection 10 mL, 10 mL, Intravenous, PRN, Irene Limbo, Cloria Spring, MD  EXAMTonette Bihari per patient if applicable:  GENERAL: alert, oriented, appears well and in no acute distress  HEENT: atraumatic, conjunttiva clear, no obvious abnormalities on inspection of external nose and ears  NECK: normal movements of the head and neck  LUNGS: on inspection no signs of respiratory distress, breathing rate appears normal, no obvious gross SOB, gasping or wheezing  CV: no obvious cyanosis  MS: moves all visible extremities without noticeable abnormality  PSYCH/NEURO: pleasant and cooperative, no obvious depression or anxiety, speech and thought processing grossly intact  ASSESSMENT AND PLAN:  Discussed the following assessment and plan:  Pruritic rash  left upper extremity of roughly 4 months duration.  Given duration of rash this is not something shingles.  Also, he has not had associated pain.  He does correlate rash with shortly after he got his vaccine and we stated we cannot be certain this was related to vaccine in any way.  -Given pruritic nature recommend trial of triamcinolone 0.5% cream twice daily as needed.  Try to avoid systemic steroids with his type 2 diabetes.  Touch base if not improving over the next couple weeks     I discussed the assessment and treatment plan with the patient. The patient was provided an opportunity to ask questions and all were answered. The patient agreed with the plan and demonstrated an understanding of the instructions.   The patient was advised to call back or seek an in-person evaluation if the symptoms worsen or if the condition fails to improve as anticipated.     Carolann Littler, MD

## 2020-04-16 ENCOUNTER — Telehealth: Payer: Self-pay | Admitting: Internal Medicine

## 2020-04-16 NOTE — Telephone Encounter (Signed)
Patient called to advise that he has a bill from a DOS 02/05/20 with Dr Cruzita Lederer. It shows a charge for an inoculation/injection. Patient did state that he did not an get inoculation/injection at this visit.  He contacted billing explained the problem to them. Patients states they reviewed the bill, resubmitted, and have now sent him a bill for an inoculation/injection stating that they found documentation supporting this inoculation/injection.  Patient was advised to call this office for assistance. Patient is requesting a call back from the office manager regarding billing issues and how to get this corrected.  His call back number is [phone]

## 2020-06-11 ENCOUNTER — Telehealth: Payer: Self-pay | Admitting: Internal Medicine

## 2020-06-11 ENCOUNTER — Other Ambulatory Visit: Payer: Self-pay

## 2020-06-11 ENCOUNTER — Encounter: Payer: Self-pay | Admitting: Internal Medicine

## 2020-06-11 ENCOUNTER — Ambulatory Visit: Payer: No Typology Code available for payment source | Admitting: Internal Medicine

## 2020-06-11 VITALS — BP 130/82 | HR 73 | Ht 68.0 in | Wt 191.6 lb

## 2020-06-11 DIAGNOSIS — E11649 Type 2 diabetes mellitus with hypoglycemia without coma: Secondary | ICD-10-CM

## 2020-06-11 DIAGNOSIS — E785 Hyperlipidemia, unspecified: Secondary | ICD-10-CM

## 2020-06-11 DIAGNOSIS — E663 Overweight: Secondary | ICD-10-CM

## 2020-06-11 DIAGNOSIS — Z794 Long term (current) use of insulin: Secondary | ICD-10-CM | POA: Diagnosis not present

## 2020-06-11 LAB — POCT GLYCOSYLATED HEMOGLOBIN (HGB A1C): Hemoglobin A1C: 6.3 % — AB (ref 4.0–5.6)

## 2020-06-11 NOTE — Progress Notes (Signed)
Patient ID: David Carr., male   DOB: 19-Sep-1945, 75 y.o.   MRN: [id number]   This visit occurred during the SARS-CoV-2 public health emergency.  Safety protocols were in place, including screening questions prior to the visit, additional usage of staff PPE, and extensive cleaning of exam room while observing appropriate contact time as indicated for disinfecting solutions.   HPI: David Carr. is a 75 y.o.-year-old male, returning for f/u for DM2, dx at the end of the 1990s, insulin-dependent since ~2014, uncontrolled, without long term complications. Last visit 4 months ago.  Interim history: He had bronchitis last month >> on Prednisone 50 mg >> sugars higher, but they improved since then. No increased urination, blurry vision, nausea.  He intentionally lost 3 pounds since last visit.  Reviewed HbA1c levels: Lab Results  Component Value Date   HGBA1C 6.5 (A) 02/05/2020   HGBA1C 7.3 (A) 10/16/2019   HGBA1C 7.2 (A) 06/10/2019   HGBA1C 6.7 (A) 02/11/2019   HGBA1C 6.8 (A) 10/07/2018   HGBA1C 6.8 (A) 05/09/2018   HGBA1C 6.7 (A) 01/08/2018   HGBA1C 6.5 (A) 09/06/2017   HGBA1C 7.2 06/05/2017   HGBA1C 6.9 03/02/2017   HGBA1C 7.5 (H) 11/07/2016   HGBA1C 6.6 08/29/2016   HGBA1C 6.4 (H) 06/19/2016   HGBA1C 7.0 05/09/2016   HGBA1C 7.5 (H) 01/18/2016   HGBA1C 7.8 (H) 10/21/2015   HGBA1C 8.0 (H) 04/15/2015   HGBA1C 7.5 (H) 01/14/2015   HGBA1C 7.2 (H) 10/16/2014   HGBA1C 7.0 (H) 04/16/2014   He is on - Metformin ER [phone] mg with dinner >> 500 mg with dinner (he had dizziness and shortness of breath with higher doses) - Glipizide 2.5-5 mg before a large meal or if he has dessert >> 2.5 >> 2.5 mg before L and D (with larger meals) - Basaglar 14 >> 16 units at bedtime He stopped Jardiance 10 mg b/c increased urination and dehydration (felt like being drunk). He was on 45 units of Humalog 75/25 at bedtime in the past.  Pt checks his sugars  twice a day: - am: 88,  108-135 >> 121-141, 151, 182, 212 >> 89-134, 159 >> 73, 78-129 - 2h after b'fast: 120, 181,  254 >> n/c >> 143-188 >> 137-164 >> 112-166 - before lunch: 101-125, 146 >> 127-150 >> 99-143, 159 >> 107-140, 182 - 2h after lunch: 172, 194 >> 145-190, 212 >> 158-177 >> 105-161 - before dinner: 120, 146 >> 106-120 >> 86-151, 170, 182 >> 69, 74-135 - 2h after dinner:128-190, 218, 226 >> 73,  89-125 >> 93-182, 212 - bedtime: 144-168 >> see above >> 147 >> 82-138, 200 >> 104, 148 - nighttime: n/c Lowest sugar was: 70s x2 >> ...>> 88  >> 64 >> 69.  He has hypoglycemia awareness in the 80s. He had an episode of loss of consciousness when sugars reached 60 in 07/17/2016.  Highest sugar was 306 >> 236 >> 200 >> 200s (prednisone).  Glucometer: Freestyle Lite  Pt's meals are: - Breakfast: coffee + nab or slice pound cake + pop tarts - Lunch: fast food or sandwich or skips - Dinner: meat + veggies  - Snacks: 1, before bedtime  He continues to walk for exercise.  -No CKD, last BUN/creatinine:  Lab Results  Component Value Date   BUN 14 09/26/2019   BUN 20 02/11/2019   CREATININE 0.91 09/26/2019   CREATININE 1.14 02/11/2019   -+ HL; last set of lipids: Lab Results  Component Value Date  CHOL 139 09/26/2019   HDL 50 09/26/2019   LDLCALC 74 09/26/2019   LDLDIRECT 74.4 06/19/2006   TRIG 73 09/26/2019   CHOLHDL 2.8 09/26/2019  On Zocor 20. - last eye exam was in 04/2019: No DR. -No numbness and tingling in his feet.  He has AML in remission.  He was discharged by Dr. Alvy Bimler from the oncology clinic due to persistent remission.  He was found to have multiple PVCs and his atenolol dose was increased before last visit.  ROS: Constitutional: + weight loss, no fatigue, no subjective hyperthermia, no subjective hypothermia Eyes: no blurry vision, no xerophthalmia ENT: no sore throat, no nodules palpated in neck, no dysphagia, no odynophagia, no hoarseness Cardiovascular: no CP/no SOB/no  palpitations/no leg swelling Respiratory: no cough/no SOB/no wheezing Gastrointestinal: no N/no V/no D/no C/no acid reflux Musculoskeletal: no muscle aches/no joint aches Skin: no rashes, no hair loss Neurological: no tremors/no numbness/no tingling/no dizziness  I reviewed pt's medications, allergies, PMH, social hx, family hx, and changes were documented in the history of present illness. Otherwise, unchanged from my initial visit note.  Past Medical History:  Diagnosis Date  . AML (acute myeloid leukemia) in remission [facility]) oncologist-  dr Alvy Bimler-- per last note 10/ 2017 in remission 2 years   dx 09-11-2013  via bone marrow bx , FLT3 negative (NP M1 +)/  chemotherapy started 09-17-2013,  remission via marrow bx 11-06-2013,  4 cycles consolidation chemo w/ HiDAC 11-13-2013 to 03-05-2014  . BPH with urinary obstruction   . Chronic thrombocytopenic purpura ([place])   . Dental caries    periodontitis  . GERD (gastroesophageal reflux disease)   . History of kidney stones   . Hyperlipidemia   . Hypertension   . MVP (mitral valve prolapse)   . Pancytopenia, acquired ([place])   . 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 Severe mitral regurgitation   . Type 2 diabetes mellitus with hypoglycemia, with long-term current use of insulin [facility])    endocrinologist-  dr Con Memos  . Wears denture    upper   Past Surgical History:  Procedure Laterality Date  . CARDIAC CATHETERIZATION    . CARDIOVASCULAR STRESS TEST  07/23/2013   Low risk nuclear study w/ mild inferior ischemia/  normal LV function and wall motion, ef 69%  . CYSTOSCOPY WITH RETROGRADE PYELOGRAM, URETEROSCOPY AND STENT PLACEMENT Right 04/20/2016   Procedure: CYSTOSCOPY WITH RETROGRADE PYELOGRAM, URETEROSCOPY , STONE  BASKETRY AND STENT PLACEMENT;  Surgeon: Franchot Gallo, MD;  Location: [facility];  Service: Urology;  Laterality: Right;  . EXTRACORPOREAL SHOCK WAVE LITHOTRIPSY  yrs ago  . HOLMIUM LASER APPLICATION Right 05/16/3293   Procedure: HOLMIUM LASER APPLICATION;  Surgeon: Franchot Gallo, MD;  Location: [facility];  Service: Urology;  Laterality: Right;  . INGUINAL HERNIA REPAIR Left 1990's  . MITRAL VALVE REPAIR N/A 06/21/2016   Procedure: MINIMALLY INVASIVE MITRAL VALVE REPAIR (MVR) USING SORIN MEMO 3D SIZE 32 SEMIRIGID ANNULOPLASTY RING;  Surgeon: Rexene Alberts, MD;  Location: [place];  Service: Open Heart Surgery;  Laterality: N/A;  . MULTIPLE EXTRACTIONS WITH ALVEOLOPLASTY N/A 06/08/2016   Procedure: MULTIPLE EXTRACTION WITH ALVEOLOPLASTY AND PRE-PROSTHETIC SURGERY AS NEEDED;  Surgeon: Lenn Cal, DDS;  Location: [place];  Service: Oral Surgery;  Laterality: N/A;  . PATENT FORAMEN OVALE(PFO)  CLOSURE N/A 06/21/2016   Procedure: PATENT FORAMEN OVALE (PFO) CLOSURE;  Surgeon: Rexene Alberts, MD;  Location: [place];  Service: Open Heart Surgery;  Laterality: N/A;  . RIGHT/LEFT HEART CATH AND CORONARY ANGIOGRAPHY N/A 06/07/2016   Procedure: Right/Left Heart Cath and Coronary Angiography;  Surgeon: Sherren Mocha, MD;  Location: [place] [facility];  Service: Cardiovascular;  Laterality: N/A;  . ROTATOR CUFF REPAIR Right [phone]  . TEE WITHOUT CARDIOVERSION N/A 01/11/2016   Procedure: TRANSESOPHAGEAL ECHOCARDIOGRAM (TEE);  Surgeon: Pixie Casino, MD;  Location: [facility] ENDOSCOPY;  Service: Cardiovascular;  Laterality: N/A; Severe MR associated w/ flail P2 segment of MV;  no LAA thromus;  small PFO by saline microbubble contrast after valsalva; LVEF 55-60%  . TEE WITHOUT CARDIOVERSION N/A 06/21/2016   Procedure: TRANSESOPHAGEAL ECHOCARDIOGRAM (TEE);  Surgeon: Rexene Alberts, MD;  Location: [place];  Service: Open Heart Surgery;  Laterality: N/A;  . TENNIS ELBOW  RELEASE/NIRSCHEL PROCEDURE Right 1990's  . TRANSTHORACIC ECHOCARDIOGRAM  12/22/2015   grade 1 diastolic dysfunction,  ef 55-60%/  moderate MVP involving posterior leaflet w/ partial flail and severe MR via CW doppler (peak gradient 41mmHg)/  trivial TR   Social History   Social History  . Marital status: Married    Spouse name: N/A  . Number of children: 2   Occupational History  . accountant   Social History Main Topics  . Smoking status: Former Research scientist (life sciences)  . Smokeless tobacco: Never Used  . Alcohol use No  . Drug use: No   Current Outpatient Medications on File Prior to Visit  Medication Sig Dispense Refill  . ACCU-CHEK GUIDE test strip USE TO CHECK BLOOD SUGAR 3 TIMES A DAY 300 strip 12  . acetaminophen (TYLENOL) 500 MG tablet Take 2 tablets (1,000 mg total) by mouth every 6 (six) hours as needed. 30 tablet 0  . aspirin 81 MG tablet Take 81 mg by mouth as needed.     Marland Kitchen atenolol (TENORMIN) 25 MG tablet Take 1 tablet (25 mg total) by mouth daily. 90 tablet 2  . glipiZIDE (GLUCOTROL) 5 MG tablet Take 0.5-1 tablet before a larger meal 45 tablet 3  . Insulin Glargine (BASAGLAR KWIKPEN) 100 UNIT/ML Inject 16-18 Units into the skin at bedtime. INJECT 14-16 UNITS INTO THE SKIN AT BEDTIME 30 mL 3  . Insulin Pen Needle (BD PEN NEEDLE NANO U/F) 32G X 4 MM MISC USE 4 TIMES A DAY 300 each 3  . ipratropium (ATROVENT) 0.06 % nasal spray Place 2 sprays into both nostrils 4 (four) times daily. 15 mL 4  . Lancets (ACCU-CHEK MULTICLIX) lancets Use as instructed to check blood sugar 3 times a day. 300 each 12  . metFORMIN (GLUCOPHAGE-XR) 500 MG 24 hr tablet TAKE 1 TABLET BY MOUTH DAILY WITH SUPPER 90 tablet 3  . simvastatin (ZOCOR) 20 MG tablet TAKE 1 TABLET BY MOUTH EVERYDAY AT BEDTIME 90 tablet 1  . triamcinolone cream (KENALOG) 0.5 % Apply to affected skin rash twice daily as needed 15 g 1   Current Facility-Administered Medications on File Prior to Visit  Medication Dose Route Frequency Provider  Last Rate Last Admin  . heparin lock flush 100 unit/mL  500 Units Intravenous Once Brunetta Genera, MD      . sodium chloride 0.9 % injection 10 mL  10 mL Intravenous PRN Brunetta Genera, MD        Allergies  Allergen Reactions  . Prochlorperazine Other (See Comments)    ARRHYTHMIAS  .  Morphine And Related     Shuts bladder down.  . Other    Family History  Problem Relation Age of Onset  . Heart attack Father   . Alcoholism Father   . Obesity Father   . Asthma Sister   . Cancer Daughter        carcinoid tumor   PE: BP 130/82 (BP Location: Right Arm, Patient Position: Sitting, Cuff Size: Normal)   Pulse 73   Ht $R'5\' 8"'Zk$  (1.727 m)   Wt 191 lb 9.6 oz (86.9 kg)   SpO2 97%   BMI 29.13 kg/m  Wt Readings from Last 3 Encounters:  06/11/20 191 lb 9.6 oz (86.9 kg)  02/05/20 194 lb 3.2 oz (88.1 kg)  10/16/19 188 lb (85.3 kg)   Constitutional: overweight, in NAD Eyes: PERRLA, EOMI, no exophthalmos ENT: moist mucous membranes, no thyromegaly, no cervical lymphadenopathy Cardiovascular: RRR, No MRG Respiratory: CTA B Gastrointestinal: abdomen soft, NT, ND, BS+ Musculoskeletal: no deformities, strength intact in all 4 Skin: moist, warm, no rashes Neurological: no tremor with outstretched hands, DTR normal in all 4  ASSESSMENT: 1. DM2, insulin-dependent, uncontrolled, without Long term complications, but with hypoglycemia  2. HL  3.  Overweight  PLAN:  1. Patient with longstanding, uncontrolled, type 2 diabetes, on low-dose metformin (could not tolerate higher doses), sulfonylurea as needed before a larger meal, and basal insulin, with improved HbA1c at last visit, at 6.5%.  At that time, sugars were much better, improved at almost all times of the day, with only few mild hyperglycemic exceptions.  He also had 1 low blood sugar at 64, at night.  We did not change his regimen at that time. -At last visit, he was complaining of recurrent styes and was interested to see  another ophthalmologist.  I advised him to schedule an appointment with Dr. Prudencio Burly.  However, he tells me that he did not reestablish care with his previous eye doctor and he is treating him for the styes. -At this visit, sugars have improved at all times of the day, without hypoglycemic values and with only significant hyperglycemia at the time of his prednisone treatment.  Otherwise, the majority of his blood sugars are at goal.  He is using glipizide low-dose before dinner and occasionally before a larger lunch.  We will continue this. -I advised him to: Patient Instructions  Please continue: - Metformin ER 500 mg with dinner - Glipizide 2.5 mg before a larger meal or if you have dessert - Basaglar 16 units at bedtime  Please return in 4 months with your sugar log.   - we checked his HbA1c: 6.3% (better) - advised to check sugars at different times of the day - 1-2x a day, rotating check times - advised for yearly eye exams >> he is not UTD - return to clinic in 4 months   2. HL -Reviewed latest lipid panel from 09/2019: LDL at goal, as were the rest of his lipid fractions: Lab Results  Component Value Date   CHOL 139 09/26/2019   HDL 50 09/26/2019   LDLCALC 74 09/26/2019   LDLDIRECT 74.4 06/19/2006   TRIG 73 09/26/2019   CHOLHDL 2.8 09/26/2019  -Continue Zocor 20 mg daily without side effects  3.  Overweight -Unfortunately, we had to stop Jardiance, which was also helping with weight loss -We will continue metformin which has a mild appetite suppressant effect -He gained 6 pounds before last visit, lost 3 lbs since then  Philemon Kingdom, MD PhD [facility]  Endocrinology

## 2020-06-11 NOTE — Telephone Encounter (Signed)
Patient called asking for a call back from the office manager. Did not specify as to why, but when he checked out today, he asked if we could remove his phone number from the system because he did not want to be contacted for reminder calls. I expressed that I could not completely remove the number, and unfortunately I could not make it to where the automated system stopped calling for reminders, but I would be able to remove all notifications with MyChart for him if he'd like (which I did), and he stated if we could not remove his phone number, he wanted to cancel his appointments here and be seen elsewhere. Attaching Dr Cruzita Lederer just as an [place].  Patient asked to be called as soon as Lenna Sciara was in the office at [phone].

## 2020-06-11 NOTE — Patient Instructions (Addendum)
Please continue: - Metformin ER 500 mg with dinner - Glipizide 2.5 mg before a larger meal or if you have dessert - Basaglar 16 units at bedtime  Please return in 4 months with your sugar log.

## 2020-07-05 ENCOUNTER — Other Ambulatory Visit: Payer: Self-pay | Admitting: Family Medicine

## 2020-07-05 DIAGNOSIS — E7800 Pure hypercholesterolemia, unspecified: Secondary | ICD-10-CM

## 2020-07-07 ENCOUNTER — Telehealth: Payer: Self-pay | Admitting: Internal Medicine

## 2020-07-07 NOTE — Telephone Encounter (Signed)
Patient's wife called and requested that the Accu-Check [place] Test Strip [facility] [employer] [facility] [address].  Patient requested that current pharmacy move the RX but were advised that they could not do that. Patient used last test strip this morning,

## 2020-07-09 ENCOUNTER — Other Ambulatory Visit: Payer: Self-pay | Admitting: Internal Medicine

## 2020-07-09 MED ORDER — ACCU-CHEK GUIDE VI STRP: ORAL_STRIP | 12 refills | Status: DC

## 2020-07-09 NOTE — Telephone Encounter (Signed)
Pt wife called to f/u on last messages re: test strip refill  Advised could take 24-48 hours

## 2020-07-09 NOTE — Telephone Encounter (Signed)
Pt wife calling  To f/u on last message and [facility] will be out of test strips today.    [facility] [place], [place]

## 2020-07-09 NOTE — Telephone Encounter (Signed)
Refill sent.

## 2020-07-09 NOTE — Telephone Encounter (Signed)
Pt wife would like a call back as soon as this is completed  [phone]

## 2020-08-04 ENCOUNTER — Other Ambulatory Visit: Payer: Self-pay | Admitting: Internal Medicine

## 2020-08-06 ENCOUNTER — Telehealth: Payer: Self-pay | Admitting: Internal Medicine

## 2020-08-06 NOTE — Telephone Encounter (Signed)
Refill needed for  Insulin Glargine (BASAGLAR KWIKPEN) 100 UNIT/ML PT IS OUT  Medication [facility] to [facility]  [facility] [place] [place]      Pt states that [facility] [address] [facility] taken off the profile

## 2020-08-11 ENCOUNTER — Other Ambulatory Visit: Payer: Self-pay

## 2020-08-11 NOTE — Telephone Encounter (Signed)
Sent to pharmacy on 08/07/20

## 2020-09-07 ENCOUNTER — Other Ambulatory Visit: Payer: Self-pay

## 2020-09-07 ENCOUNTER — Encounter: Payer: Self-pay | Admitting: Family Medicine

## 2020-09-07 ENCOUNTER — Ambulatory Visit (INDEPENDENT_AMBULATORY_CARE_PROVIDER_SITE_OTHER): Payer: No Typology Code available for payment source | Admitting: Family Medicine

## 2020-09-07 VITALS — BP 138/82 | HR 73 | Temp 98.1°F | Ht 68.0 in | Wt 185.2 lb

## 2020-09-07 DIAGNOSIS — Z0000 Encounter for general adult medical examination without abnormal findings: Secondary | ICD-10-CM | POA: Diagnosis not present

## 2020-09-07 LAB — LIPID PANEL
Cholesterol: 127 mg/dL (ref 0–200)
HDL: 48.1 mg/dL (ref >39.00)
LDL Cholesterol: 66 mg/dL (ref 0–99)
NonHDL: 79.29
Total CHOL/HDL Ratio: 3
Triglycerides: 64 mg/dL (ref 0.0–149.0)
VLDL: 12.8 mg/dL (ref 0.0–40.0)

## 2020-09-07 LAB — COMPREHENSIVE METABOLIC PANEL
ALT: 18 U/L (ref 0–53)
AST: 21 U/L (ref 0–37)
Albumin: 4.3 g/dL (ref 3.5–5.2)
Alkaline Phosphatase: 53 U/L (ref 39–117)
BUN: 14 mg/dL (ref 6–23)
CO2: 28 mEq/L (ref 19–32)
Calcium: 9.5 mg/dL (ref 8.4–10.5)
Chloride: 102 mEq/L (ref 96–112)
Creatinine, Ser: 0.87 mg/dL (ref 0.40–1.50)
GFR: 83.49 mL/min (ref >60.00)
Glucose, Bld: 139 mg/dL — ABNORMAL HIGH (ref 70–99)
Potassium: 4.4 mEq/L (ref 3.5–5.1)
Sodium: 139 mEq/L (ref 135–145)
Total Bilirubin: 1 mg/dL (ref 0.2–1.2)
Total Protein: 6.5 g/dL (ref 6.0–8.3)

## 2020-09-07 LAB — CBC WITH DIFFERENTIAL/PLATELET
Basophils Absolute: 0 10*3/uL (ref 0.0–0.1)
Basophils Relative: 0.5 % (ref 0.0–3.0)
Eosinophils Absolute: 0 10*3/uL (ref 0.0–0.7)
Eosinophils Relative: 0.6 % (ref 0.0–5.0)
HCT: 49.3 % (ref 39.0–52.0)
Hemoglobin: 16.9 g/dL (ref 13.0–17.0)
Lymphocytes Relative: 23.7 % (ref 12.0–46.0)
Lymphs Abs: 1 10*3/uL (ref 0.7–4.0)
MCHC: 34.2 g/dL (ref 30.0–36.0)
MCV: 93.3 fl (ref 78.0–100.0)
Monocytes Absolute: 0.4 10*3/uL (ref 0.1–1.0)
Monocytes Relative: 9.2 % (ref 3.0–12.0)
Neutro Abs: 2.8 10*3/uL (ref 1.4–7.7)
Neutrophils Relative %: 66 % (ref 43.0–77.0)
Platelets: 120 10*3/uL — ABNORMAL LOW (ref 150.0–400.0)
RBC: 5.28 Mil/uL (ref 4.22–5.81)
RDW: 14.1 % (ref 11.5–15.5)
WBC: 4.2 10*3/uL (ref 4.0–10.5)

## 2020-09-07 LAB — MICROALBUMIN / CREATININE URINE RATIO
Creatinine,U: 214.3 mg/dL
Microalb Creat Ratio: 3.2 mg/g (ref 0.0–30.0)
Microalb, Ur: 6.9 mg/dL — ABNORMAL HIGH (ref 0.0–1.9)

## 2020-09-07 NOTE — Progress Notes (Signed)
Established Patient Office Visit  Subjective:  Patient ID: David Mauger., male    DOB: 01-Oct-1945  Age: 75 y.o. MRN: [id number]  CC: No chief complaint on file.   HPI David Stuckey [name] presents for physical exam.  He has history of type 2 diabetes, hyperlipidemia, AML, hypertension, CAD.  He generally feels well.  He is enjoying retirement.  Is followed by endocrinology regarding his type 2 diabetes and is followed by oncology still regarding his history of AML. Health maintenance reviewed  -He states he had eye exam about a year ago.  Not sure of exact dates -Tetanus due 2031 -Shingrix vaccine complete -Pneumonia vaccines complete -Declines hepatitis C screening -Declines further colonoscopies  Social history-married for 40 years.  2 children.  5 grandchildren.  Retired as an Optometrist.  Quit smoking 1976.  No regular alcohol use.  Family history reviewed with no changes   Past Medical History:  Diagnosis Date   AML (acute myeloid leukemia) in remission [facility]) oncologist-  dr Alvy Bimler-- per last note 10/ 2017 in remission 2 years   dx 09-11-2013  via bone marrow bx , FLT3 negative (NP M1 +)/  chemotherapy started 09-17-2013,  remission via marrow bx 11-06-2013,  4 cycles consolidation chemo w/ HiDAC 11-13-2013 to 03-05-2014   BPH with urinary obstruction    Chronic thrombocytopenic purpura ([place])    Dental caries    periodontitis   GERD (gastroesophageal reflux disease)    History of kidney stones    Hyperlipidemia    Hypertension    MVP (mitral valve prolapse)    Pancytopenia, acquired ([place])    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   Complex valvuloplasty including triangular resection of flail segment of posterior leaflet, artificial Gore-tex neochord placement x6 and 84m Sorin Memo 3D ring annuloplasty via right mini thoracotomy approach   Severe mitral regurgitation    Type 2 diabetes mellitus with hypoglycemia, with long-term current use of  insulin ([facility]    endocrinologist-  dr gCon Memos  Wears denture    upper    Past Surgical History:  Procedure Laterality Date   CARDIAC CATHETERIZATION     CARDIOVASCULAR STRESS TEST  07/23/2013   Low risk nuclear study w/ mild inferior ischemia/  normal LV function and wall motion, ef 69%   CYSTOSCOPY WITH RETROGRADE PYELOGRAM, URETEROSCOPY AND STENT PLACEMENT Right 04/20/2016   Procedure: CYSTOSCOPY WITH RETROGRADE PYELOGRAM, URETEROSCOPY , STONE BASKETRY AND STENT PLACEMENT;  Surgeon: SFranchot Gallo MD;  [facility]  Service: Urology;  Laterality: Right;   EXTRACORPOREAL SHOCK WAVE LITHOTRIPSY  yrs ago   HOLMIUM LASER APPLICATION Right 33/09/1827  Procedure: HOLMIUM LASER APPLICATION;  Surgeon: SFranchot Gallo MD;  [facility]  Service: Urology;  Laterality: Right;   INGUINAL HERNIA REPAIR Left 1990's   MITRAL VALVE REPAIR N/A 06/21/2016   Procedure: MINIMALLY INVASIVE MITRAL VALVE REPAIR (MVR) USING SORIN MEMO 3D SIZE 32 SEMIRIGID ANNULOPLASTY RING;  Surgeon: ORexene Alberts MD;  [facility]  Service: Open Heart Surgery;  Laterality: N/A;   MULTIPLE EXTRACTIONS WITH ALVEOLOPLASTY N/A 06/08/2016   Procedure: MULTIPLE EXTRACTION WITH ALVEOLOPLASTY AND PRE-PROSTHETIC SURGERY AS NEEDED;  Surgeon: RLenn Cal DDS;  [place] [facility]  Service: Oral Surgery;  Laterality: N/A;   PATENT FORAMEN OVALE(PFO) CLOSURE N/A 06/21/2016   Procedure: PATENT FORAMEN OVALE (PFO) CLOSURE;  Surgeon: ORexene Alberts MD;  [place] [facility]  Service: Open Heart Surgery;  Laterality: N/A;  RIGHT/LEFT HEART CATH AND CORONARY ANGIOGRAPHY N/A 06/07/2016   Procedure: Right/Left Heart Cath and Coronary Angiography;  Surgeon: Sherren Mocha, MD;  Location: [place] [facility];  Service: Cardiovascular;  Laterality: N/A;   ROTATOR CUFF REPAIR Right [id number]   TEE WITHOUT CARDIOVERSION N/A 01/11/2016   Procedure: TRANSESOPHAGEAL ECHOCARDIOGRAM (TEE);   Surgeon: Pixie Casino, MD;  Location: [facility] ENDOSCOPY;  Service: Cardiovascular;  Laterality: N/A; Severe MR associated w/ flail P2 segment of MV;  no LAA thromus;  small PFO by saline microbubble contrast after valsalva; LVEF 55-60%   TEE WITHOUT CARDIOVERSION N/A 06/21/2016   Procedure: TRANSESOPHAGEAL ECHOCARDIOGRAM (TEE);  Surgeon: Rexene Alberts, MD;  Location: [place];  Service: Open Heart Surgery;  Laterality: N/A;   TENNIS ELBOW RELEASE/NIRSCHEL PROCEDURE Right 1990's   TRANSTHORACIC ECHOCARDIOGRAM  12/22/2015   grade 1 diastolic dysfunction,  ef 55-60%/  moderate MVP involving posterior leaflet w/ partial flail and severe MR via CW doppler (peak gradient 25mHg)/  trivial TR    Family History  Problem Relation Age of Onset   Heart attack Father    Alcoholism Father    Obesity Father    Asthma Sister    Cancer Daughter        carcinoid tumor    Social History   Socioeconomic History   Marital status: Married    Spouse name: Not on file   Number of children: 2   Years of education: college grad   Highest education level: Not on file  Occupational History    Comment: retired, NA  Tobacco Use   Smoking status: Former    Packs/day: 1.50    Years: 20.00    Pack years: 30.00    Types: Cigarettes    Quit date: 04/18/1974    Years since quitting: 46.4   Smokeless tobacco: Never  Vaping Use   Vaping Use: Never used  Substance and Sexual Activity   Alcohol use: No   Drug use: No   Sexual activity: Not Currently  Other Topics Concern   Not on file  Social History Narrative   Lives wife,  Son   Coffee, 1 cup  daily   Social Determinants of Health   Financial Resource Strain: Not on file  Food Insecurity: Not on file  Transportation Needs: Not on file  Physical Activity: Not on file  Stress: Not on file  Social Connections: Not on file  Intimate Partner Violence: Not on file    Outpatient Medications Prior to Visit  Medication Sig Dispense Refill   ACCU-CHEK GUIDE  test strip USE TO CHECK BLOOD SUGAR 3 TIMES A DAY 300 strip 5   acetaminophen (TYLENOL) 500 MG tablet Take 2 tablets (1,000 mg total) by mouth every 6 (six) hours as needed. 30 tablet 0   aspirin 81 MG tablet Take 81 mg by mouth as needed.      atenolol (TENORMIN) 25 MG tablet Take 1 tablet (25 mg total) by mouth daily. 90 tablet 2   glipiZIDE (GLUCOTROL) 5 MG tablet Take 0.5-1 tablet before a larger meal 45 tablet 3   Insulin Glargine (BASAGLAR KWIKPEN) 100 UNIT/ML INJECT 14-16 UNITS INTO THE SKIN AT BEDTIME 30 mL 2   Insulin Pen Needle (BD PEN NEEDLE NANO U/F) 32G X 4 MM MISC USE 4 TIMES A DAY 300 each 3   ipratropium (ATROVENT) 0.06 % nasal spray Place 2 sprays into both nostrils 4 (four) times daily. 15 mL 4   Lancets (ACCU-CHEK MULTICLIX) lancets Use as instructed  to check blood sugar 3 times a day. 300 each 12   metFORMIN (GLUCOPHAGE-XR) 500 MG 24 hr tablet TAKE 1 TABLET BY MOUTH DAILY WITH SUPPER 90 tablet 3   simvastatin (ZOCOR) 20 MG tablet TAKE 1 TABLET BY MOUTH EVERYDAY AT BEDTIME 90 tablet 1   triamcinolone cream (KENALOG) 0.5 % Apply to affected skin rash twice daily as needed 15 g 1   Facility-Administered Medications Prior to Visit  Medication Dose Route Frequency Provider Last Rate Last Admin   heparin lock flush 100 unit/mL  500 Units Intravenous Once Brunetta Genera, MD       sodium chloride 0.9 % injection 10 mL  10 mL Intravenous PRN Brunetta Genera, MD        Allergies  Allergen Reactions   Prochlorperazine Other (See Comments)    ARRHYTHMIAS   Morphine And Related     Shuts bladder down.   Other     ROS Review of Systems  Constitutional:  Negative for activity change, appetite change, fatigue and fever.  HENT:  Negative for congestion, ear pain and trouble swallowing.   Eyes:  Negative for pain and visual disturbance.  Respiratory:  Negative for cough, shortness of breath and wheezing.   Cardiovascular:  Negative for chest pain and palpitations.   Gastrointestinal:  Negative for abdominal distention, abdominal pain, blood in stool, constipation, diarrhea, nausea, rectal pain and vomiting.  Endocrine: Negative for polydipsia and polyuria.  Genitourinary:  Negative for dysuria, hematuria and testicular pain.  Musculoskeletal:  Negative for arthralgias and joint swelling.  Skin:  Negative for rash.  Neurological:  Negative for dizziness, syncope and headaches.  Hematological:  Negative for adenopathy.  Psychiatric/Behavioral:  Negative for confusion and dysphoric mood.      Objective:    Physical Exam Constitutional:      General: He is not in acute distress.    Appearance: He is well-developed.  HENT:     Head: Normocephalic and atraumatic.     Right Ear: External ear normal.     Left Ear: External ear normal.  Eyes:     Conjunctiva/sclera: Conjunctivae normal.     Pupils: Pupils are equal, round, and reactive to light.  Neck:     Thyroid: No thyromegaly.  Cardiovascular:     Rate and Rhythm: Normal rate and regular rhythm.     Heart sounds: Normal heart sounds. No murmur heard. Pulmonary:     Effort: No respiratory distress.     Breath sounds: No wheezing or rales.  Abdominal:     General: Bowel sounds are normal. There is no distension.     Palpations: Abdomen is soft.     Tenderness: There is no abdominal tenderness. There is no guarding or rebound.     Hernia: A hernia is present.     Comments: He has fairly large umbilical hernia but soft and nontender  Musculoskeletal:     Cervical back: Normal range of motion and neck supple.  Lymphadenopathy:     Cervical: No cervical adenopathy.  Skin:    Findings: No rash.     Comments: Feet reveal no skin lesions. Good distal foot pulses. Good capillary refill. No calluses. Normal sensation with monofilament testing   Neurological:     Mental Status: He is alert and oriented to person, place, and time.     Cranial Nerves: No cranial nerve deficit.     Deep Tendon  Reflexes: Reflexes normal.    BP 138/82 (BP Location: Right Arm,  Patient Position: Sitting, Cuff Size: Normal)   Pulse 73   Temp 98.1 F (36.7 C) (Oral)   Ht _0  (1.727 m)   Wt 185 lb 3.2 oz (84 kg)   SpO2 95%   BMI 28.16 kg/m  Wt Readings from Last 3 Encounters:  09/07/20 185 lb 3.2 oz (84 kg)  06/11/20 191 lb 9.6 oz (86.9 kg)  02/05/20 194 lb 3.2 oz (88.1 kg)     Health Maintenance Due  Topic Date Due   FOOT EXAM  01/09/2019   COVID-19 Vaccine (4 - Booster for Pfizer series) 02/29/2020   OPHTHALMOLOGY EXAM  04/30/2020   URINE MICROALBUMIN  09/25/2020    There are no preventive care reminders to display for this patient.  Lab Results  Component Value Date   TSH 4.500 02/11/2019   Lab Results  Component Value Date   WBC 4.9 09/26/2019   HGB 17.3 (H) 09/26/2019   HCT 50.4 (H) 09/26/2019   MCV 93.9 09/26/2019   PLT 122 (L) 09/26/2019   Lab Results  Component Value Date   NA 139 09/26/2019   K 4.7 09/26/2019   CHLORIDE 104 12/02/2015   CO2 31 09/26/2019   GLUCOSE 152 (H) 09/26/2019   BUN 14 09/26/2019   CREATININE 0.91 09/26/2019   BILITOT 0.9 09/26/2019   ALKPHOS 60 09/25/2018   AST 21 09/26/2019   ALT 29 09/26/2019   PROT 6.4 09/26/2019   ALBUMIN 4.3 09/25/2018   CALCIUM 9.7 09/26/2019   ANIONGAP 8 08/30/2017   EGFR 71 (L) 12/02/2015   GFR 75.43 09/25/2018   Lab Results  Component Value Date   CHOL 139 09/26/2019   Lab Results  Component Value Date   HDL 50 09/26/2019   Lab Results  Component Value Date   LDLCALC 74 09/26/2019   Lab Results  Component Value Date   TRIG 73 09/26/2019   Lab Results  Component Value Date   CHOLHDL 2.8 09/26/2019   Lab Results  Component Value Date   HGBA1C 6.3 (A) 06/11/2020      Assessment & Plan:   Problem List Items Addressed This Visit   None Visit Diagnoses     Physical exam    -  Primary   Relevant Orders   CBC with Differential/Platelet   CMP   Lipid panel   Microalbumin /  creatinine urine ratio     75 year old male with history of chronic problems as above.  His diabetes has been well controlled.  He has past history of AML.  Generally doing well with no complaints.  We discussed the following health maintenance issues  -Recommend yearly flu vaccine -Obtain labs as above -Vaccinations up-to-date at this time -He declines further colonoscopy -Currently walking 2 to 3 miles per day.  He averages about 12-13,000 steps per day -Continue with yearly diabetic eye exam  No orders of the defined types were placed in this encounter.   Follow-up: Return in about 3 months (around 12/08/2020).    Carolann Littler, MD

## 2020-09-13 ENCOUNTER — Telehealth: Payer: Self-pay

## 2020-09-13 ENCOUNTER — Other Ambulatory Visit: Payer: Self-pay | Admitting: Adult Health

## 2020-09-13 DIAGNOSIS — I10 Essential (primary) hypertension: Secondary | ICD-10-CM

## 2020-09-13 MED ORDER — ATENOLOL 25 MG PO TABS: 25.0000 mg | ORAL_TABLET | Freq: Every day | ORAL | 2 refills | Status: DC

## 2020-09-13 NOTE — Telephone Encounter (Signed)
Rx sent in

## 2020-10-13 ENCOUNTER — Ambulatory Visit: Payer: No Typology Code available for payment source | Admitting: Internal Medicine

## 2020-11-01 ENCOUNTER — Telehealth: Payer: Self-pay | Admitting: Family Medicine

## 2020-11-01 ENCOUNTER — Other Ambulatory Visit: Payer: Self-pay | Admitting: Internal Medicine

## 2020-11-01 DIAGNOSIS — J3489 Other specified disorders of nose and nasal sinuses: Secondary | ICD-10-CM

## 2020-11-01 MED ORDER — IPRATROPIUM BROMIDE 0.06 % NA SOLN: 2.0000 | Freq: Four times a day (QID) | NASAL | 4 refills | Status: DC

## 2020-11-01 MED ORDER — BD PEN NEEDLE NANO U/F 32G X 4 MM MISC: 3 refills | Status: DC

## 2020-11-01 NOTE — Telephone Encounter (Signed)
Rx sent in

## 2020-11-01 NOTE — Telephone Encounter (Signed)
Pt call and stated he need a refill on Insulin Pen Needle (BD PEN NEEDLE NANO U/F) 32G X 4 MM MISC  and ipratropium (ATROVENT) 0.06 % nasal spray sent to  [facility] [id number] [place] [name], [place] Phone:  [phone]  Fax:  [phone]

## 2020-11-27 ENCOUNTER — Other Ambulatory Visit: Payer: Self-pay | Admitting: Internal Medicine

## 2020-12-14 ENCOUNTER — Other Ambulatory Visit: Payer: Self-pay | Admitting: Internal Medicine

## 2021-01-09 ENCOUNTER — Other Ambulatory Visit: Payer: Self-pay | Admitting: Family Medicine

## 2021-01-09 DIAGNOSIS — E7800 Pure hypercholesterolemia, unspecified: Secondary | ICD-10-CM

## 2021-03-26 ENCOUNTER — Other Ambulatory Visit: Payer: Self-pay | Admitting: Internal Medicine

## 2021-04-04 ENCOUNTER — Telehealth: Payer: Self-pay

## 2021-04-04 NOTE — Telephone Encounter (Signed)
Pt contacted office regarding rx sent to pharmacy for 30 days supply instead of 90 day. It was explained to the patient that because he had not been seen in almost a year he would need to schedule a follow up appt for a 90 day supply. Pt advised he would no longer be coming to this office due to a previous issue. Pt advised he would be following up with his PCP to take over his diabetes management. Pt advised he could get one last 81 day suppy from Dr Cruzita Lederer until he is established with another provider. Pt advised he would call back if it was needed.

## 2021-04-09 ENCOUNTER — Other Ambulatory Visit: Payer: Self-pay | Admitting: Internal Medicine

## 2021-04-10 ENCOUNTER — Other Ambulatory Visit: Payer: Self-pay | Admitting: Internal Medicine

## 2021-04-13 ENCOUNTER — Encounter: Payer: Self-pay | Admitting: Family Medicine

## 2021-04-13 ENCOUNTER — Ambulatory Visit (INDEPENDENT_AMBULATORY_CARE_PROVIDER_SITE_OTHER): Payer: No Typology Code available for payment source | Admitting: Family Medicine

## 2021-04-13 VITALS — BP 128/78 | HR 73 | Temp 98.2°F | Resp 16 | Ht 68.0 in | Wt 189.9 lb

## 2021-04-13 DIAGNOSIS — Z794 Long term (current) use of insulin: Secondary | ICD-10-CM

## 2021-04-13 DIAGNOSIS — E785 Hyperlipidemia, unspecified: Secondary | ICD-10-CM

## 2021-04-13 DIAGNOSIS — E11649 Type 2 diabetes mellitus with hypoglycemia without coma: Secondary | ICD-10-CM | POA: Diagnosis not present

## 2021-04-13 DIAGNOSIS — Z0000 Encounter for general adult medical examination without abnormal findings: Secondary | ICD-10-CM

## 2021-04-13 LAB — LIPID PANEL
Cholesterol: 129 mg/dL (ref 0–200)
HDL: 47.9 mg/dL (ref >39.00)
LDL Cholesterol: 68 mg/dL (ref 0–99)
NonHDL: 81.28
Total CHOL/HDL Ratio: 3
Triglycerides: 65 mg/dL (ref 0.0–149.0)
VLDL: 13 mg/dL (ref 0.0–40.0)

## 2021-04-13 LAB — COMPREHENSIVE METABOLIC PANEL WITH GFR
ALT: 30 U/L (ref 0–53)
AST: 26 U/L (ref 0–37)
Albumin: 4.4 g/dL (ref 3.5–5.2)
Alkaline Phosphatase: 63 U/L (ref 39–117)
BUN: 19 mg/dL (ref 6–23)
CO2: 28 meq/L (ref 19–32)
Calcium: 9.6 mg/dL (ref 8.4–10.5)
Chloride: 103 meq/L (ref 96–112)
Creatinine, Ser: 1 mg/dL (ref 0.40–1.50)
GFR: 72.52 mL/min
Glucose, Bld: 122 mg/dL — ABNORMAL HIGH (ref 70–99)
Potassium: 4.8 meq/L (ref 3.5–5.1)
Sodium: 140 meq/L (ref 135–145)
Total Bilirubin: 1.2 mg/dL (ref 0.2–1.2)
Total Protein: 6.8 g/dL (ref 6.0–8.3)

## 2021-04-13 LAB — CBC WITH DIFFERENTIAL/PLATELET
Basophils Absolute: 0 10*3/uL (ref 0.0–0.1)
Basophils Relative: 0.6 % (ref 0.0–3.0)
Eosinophils Absolute: 0 10*3/uL (ref 0.0–0.7)
Eosinophils Relative: 0.3 % (ref 0.0–5.0)
HCT: 51.5 % (ref 39.0–52.0)
Hemoglobin: 17.3 g/dL — ABNORMAL HIGH (ref 13.0–17.0)
Lymphocytes Relative: 21.6 % (ref 12.0–46.0)
Lymphs Abs: 1.1 10*3/uL (ref 0.7–4.0)
MCHC: 33.6 g/dL (ref 30.0–36.0)
MCV: 93.8 fl (ref 78.0–100.0)
Monocytes Absolute: 0.5 10*3/uL (ref 0.1–1.0)
Monocytes Relative: 9 % (ref 3.0–12.0)
Neutro Abs: 3.6 10*3/uL (ref 1.4–7.7)
Neutrophils Relative %: 68.5 % (ref 43.0–77.0)
Platelets: 124 10*3/uL — ABNORMAL LOW (ref 150.0–400.0)
RBC: 5.49 Mil/uL (ref 4.22–5.81)
RDW: 14.1 % (ref 11.5–15.5)
WBC: 5.3 10*3/uL (ref 4.0–10.5)

## 2021-04-13 LAB — URINALYSIS
Bilirubin Urine: NEGATIVE
Hgb urine dipstick: NEGATIVE
Ketones, ur: NEGATIVE
Leukocytes,Ua: NEGATIVE
Nitrite: NEGATIVE
Specific Gravity, Urine: >=1.03 — AB (ref 1.000–1.030)
Total Protein, Urine: NEGATIVE
Urine Glucose: 100 — AB
Urobilinogen, UA: 0.2 (ref 0.0–1.0)
pH: 5.5 (ref 5.0–8.0)

## 2021-04-13 LAB — HEMOGLOBIN A1C: Hgb A1c MFr Bld: 6.9 % — ABNORMAL HIGH (ref 4.6–6.5)

## 2021-04-13 MED ORDER — METFORMIN HCL ER 500 MG PO TB24: 500.0000 mg | ORAL_TABLET | Freq: Every day | ORAL | 3 refills | Status: DC

## 2021-04-13 NOTE — Progress Notes (Signed)
Established Patient Office Visit  Subjective:  Patient ID: David Affeldt., male    DOB: 1945-07-25  Age: 76 y.o. MRN: [id number]  CC:  Chief Complaint  Patient presents with   Annual Exam    HPI David Spisak. presents for annual physical exam.  He has history of acute myeloid leukemia in remission.  Other medical problems include history of hypertension, CAD, type 2 diabetes, hyperlipidemia.  He has been followed by endocrinology in the past but has decided to follow-up here instead regarding his fairly well-controlled diabetes.  Last A1c was 6.3%.  He brings in a large log of home readings and these have been consistently well controlled.  Last eye exam was over a year ago.  He is on low-dose glipizide.  No recent hypoglycemia.  Takes extended release metformin.  Other medications include low-dose atenolol and simvastatin.  Health maintenance reviewed  -He has decided against any further colonoscopies -Flu vaccine was given in September -Pneumonia vaccines complete -Has had Shingrix vaccine -Tetanus due 2031 -Urine microalbumin 7/22  Family history and social history reviewed with no significant changes.  He has been walking with a goal of 7000 steps per day.  No recent falls.  Past Medical History:  Diagnosis Date   AML (acute myeloid leukemia) in remission [facility]) oncologist-  dr Alvy Bimler-- per last note 10/ 2017 in remission 2 years   dx 09-11-2013  via bone marrow bx , FLT3 negative (NP M1 +)/  chemotherapy started 09-17-2013,  remission via marrow bx 11-06-2013,  4 cycles consolidation chemo w/ HiDAC 11-13-2013 to 03-05-2014   BPH with urinary obstruction    Chronic thrombocytopenic purpura ([place])    Dental caries    periodontitis   GERD (gastroesophageal reflux disease)    History of kidney stones    Hyperlipidemia    Hypertension    MVP (mitral valve prolapse)    Pancytopenia, acquired ([place])    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    Complex valvuloplasty including triangular resection of flail segment of posterior leaflet, artificial Gore-tex neochord placement x6 and 49m Sorin Memo 3D ring annuloplasty via right mini thoracotomy approach   Severe mitral regurgitation    Type 2 diabetes mellitus with hypoglycemia, with long-term current use of insulin ([facility]    endocrinologist-  dr gCon Memos  Wears denture    upper    Past Surgical History:  Procedure Laterality Date   CARDIAC CATHETERIZATION     CARDIOVASCULAR STRESS TEST  07/23/2013   Low risk nuclear study w/ mild inferior ischemia/  normal LV function and wall motion, ef 69%   CYSTOSCOPY WITH RETROGRADE PYELOGRAM, URETEROSCOPY AND STENT PLACEMENT Right 04/20/2016   Procedure: CYSTOSCOPY WITH RETROGRADE PYELOGRAM, URETEROSCOPY , STONE BASKETRY AND STENT PLACEMENT;  Surgeon: SFranchot Gallo MD;  [facility]  Service: Urology;  Laterality: Right;   EXTRACORPOREAL SHOCK WAVE LITHOTRIPSY  yrs ago   HOLMIUM LASER APPLICATION Right 31/02/1733  Procedure: HOLMIUM LASER APPLICATION;  Surgeon: SFranchot Gallo MD;  [facility]  Service: Urology;  Laterality: Right;   INGUINAL HERNIA REPAIR Left 1990's   MITRAL VALVE REPAIR N/A 06/21/2016   Procedure: MINIMALLY INVASIVE MITRAL VALVE REPAIR (MVR) USING SORIN MEMO 3D SIZE 32 SEMIRIGID ANNULOPLASTY RING;  Surgeon: ORexene Alberts MD;  [place]  Service: Open Heart Surgery;  Laterality: N/A;   MULTIPLE EXTRACTIONS WITH ALVEOLOPLASTY N/A 06/08/2016   Procedure: MULTIPLE EXTRACTION WITH ALVEOLOPLASTY AND PRE-PROSTHETIC SURGERY AS  NEEDED;  Surgeon: Lenn Cal, DDS;  Location: [place];  Service: Oral Surgery;  Laterality: N/A;   PATENT FORAMEN OVALE(PFO) CLOSURE N/A 06/21/2016   Procedure: PATENT FORAMEN OVALE (PFO) CLOSURE;  Surgeon: Rexene Alberts, MD;  Location: [place];  Service: Open Heart Surgery;  Laterality: N/A;   RIGHT/LEFT HEART CATH AND CORONARY ANGIOGRAPHY N/A  06/07/2016   Procedure: Right/Left Heart Cath and Coronary Angiography;  Surgeon: Sherren Mocha, MD;  Location: [place] CV LAB;  Service: Cardiovascular;  Laterality: N/A;   ROTATOR CUFF REPAIR Right [id number]   TEE WITHOUT CARDIOVERSION N/A 01/11/2016   Procedure: TRANSESOPHAGEAL ECHOCARDIOGRAM (TEE);  Surgeon: Pixie Casino, MD;  Location: [facility] ENDOSCOPY;  Service: Cardiovascular;  Laterality: N/A; Severe MR associated w/ flail P2 segment of MV;  no LAA thromus;  small PFO by saline microbubble contrast after valsalva; LVEF 55-60%   TEE WITHOUT CARDIOVERSION N/A 06/21/2016   Procedure: TRANSESOPHAGEAL ECHOCARDIOGRAM (TEE);  Surgeon: Rexene Alberts, MD;  Location: [place];  Service: Open Heart Surgery;  Laterality: N/A;   TENNIS ELBOW RELEASE/NIRSCHEL PROCEDURE Right 1990's   TRANSTHORACIC ECHOCARDIOGRAM  12/22/2015   grade 1 diastolic dysfunction,  ef 55-60%/  moderate MVP involving posterior leaflet w/ partial flail and severe MR via CW doppler (peak gradient 53mHg)/  trivial TR    Family History  Problem Relation Age of Onset   Heart attack Father    Alcoholism Father    Obesity Father    Asthma Sister    Cancer Daughter        carcinoid tumor    Social History   Socioeconomic History   Marital status: Married    Spouse name: Not on file   Number of children: 2   Years of education: college grad   Highest education level: Not on file  Occupational History    Comment: retired, NA  Tobacco Use   Smoking status: Former    Packs/day: 1.50    Years: 20.00    Pack years: 30.00    Types: Cigarettes    Quit date: 04/18/1974    Years since quitting: 47.0   Smokeless tobacco: Never  Vaping Use   Vaping Use: Never used  Substance and Sexual Activity   Alcohol use: No   Drug use: No   Sexual activity: Not Currently  Other Topics Concern   Not on file  Social History Narrative   Lives wife,  Son   Coffee, 1 cup  daily   Social Determinants of Health   Financial Resource  Strain: Not on file  Food Insecurity: Not on file  Transportation Needs: Not on file  Physical Activity: Not on file  Stress: Not on file  Social Connections: Not on file  Intimate Partner Violence: Not on file    Outpatient Medications Prior to Visit  Medication Sig Dispense Refill   ACCU-CHEK GUIDE test strip USE TO CHECK BLOOD SUGAR 3 TIMES A DAY 300 strip 5   acetaminophen (TYLENOL) 500 MG tablet Take 2 tablets (1,000 mg total) by mouth every 6 (six) hours as needed. 30 tablet 0   aspirin 81 MG tablet Take 81 mg by mouth as needed.      atenolol (TENORMIN) 25 MG tablet Take 1 tablet (25 mg total) by mouth daily. 90 tablet 2   glipiZIDE (GLUCOTROL) 5 MG tablet TAKE 0.5-1 TABLET BEFORE A LARGER MEAL 90 tablet 0   Insulin Glargine (BASAGLAR KWIKPEN) 100 UNIT/ML INJECT 14-16 UNITS INTO THE SKIN AT BEDTIME 30 mL  2   Insulin Pen Needle (BD PEN NEEDLE NANO U/F) 32G X 4 MM MISC USE 4 TIMES A DAY 300 each 3   ipratropium (ATROVENT) 0.06 % nasal spray Place 2 sprays into both nostrils 4 (four) times daily. 15 mL 4   Lancets (ACCU-CHEK MULTICLIX) lancets Use as instructed to check blood sugar 3 times a day. 300 each 12   simvastatin (ZOCOR) 20 MG tablet TAKE 1 TABLET BY MOUTH EVERYDAY AT BEDTIME 90 tablet 1   triamcinolone cream (KENALOG) 0.5 % Apply to affected skin rash twice daily as needed 15 g 1   metFORMIN (GLUCOPHAGE-XR) 500 MG 24 hr tablet TAKE 1 TABLET BY MOUTH DAILY WITH SUPPER 30 tablet 0   Facility-Administered Medications Prior to Visit  Medication Dose Route Frequency Provider Last Rate Last Admin   heparin lock flush 100 unit/mL  500 Units Intravenous Once Brunetta Genera, MD       sodium chloride 0.9 % injection 10 mL  10 mL Intravenous PRN Brunetta Genera, MD        Allergies  Allergen Reactions   Prochlorperazine Other (See Comments)    ARRHYTHMIAS   Morphine And Related     Shuts bladder down.   Other     ROS Review of Systems  Constitutional:  Negative  for activity change, appetite change, fatigue and fever.  HENT:  Negative for congestion, ear pain and trouble swallowing.   Eyes:  Negative for pain and visual disturbance.  Respiratory:  Negative for cough, shortness of breath and wheezing.   Cardiovascular:  Negative for chest pain and palpitations.  Gastrointestinal:  Negative for abdominal distention, abdominal pain, blood in stool, constipation, diarrhea, nausea, rectal pain and vomiting.  Genitourinary:  Negative for dysuria, hematuria and testicular pain.  Musculoskeletal:  Negative for arthralgias and joint swelling.  Skin:  Negative for rash.  Neurological:  Negative for dizziness, syncope and headaches.  Hematological:  Negative for adenopathy.  Psychiatric/Behavioral:  Negative for confusion and dysphoric mood.      Objective:    Physical Exam Constitutional:      General: He is not in acute distress.    Appearance: He is well-developed.  HENT:     Head: Normocephalic and atraumatic.     Right Ear: External ear normal.     Left Ear: External ear normal.  Eyes:     Conjunctiva/sclera: Conjunctivae normal.     Pupils: Pupils are equal, round, and reactive to light.  Neck:     Thyroid: No thyromegaly.  Cardiovascular:     Rate and Rhythm: Normal rate and regular rhythm.     Heart sounds: Normal heart sounds. No murmur heard. Pulmonary:     Effort: No respiratory distress.     Breath sounds: No wheezing or rales.  Abdominal:     General: Bowel sounds are normal. There is no distension.     Palpations: Abdomen is soft.     Tenderness: There is no abdominal tenderness. There is no guarding or rebound.     Comments: Umbilical hernia which is soft and nontender.  Musculoskeletal:     Cervical back: Normal range of motion and neck supple.  Lymphadenopathy:     Cervical: No cervical adenopathy.  Skin:    Findings: No rash.  Neurological:     Mental Status: He is alert and oriented to person, place, and time.      Cranial Nerves: No cranial nerve deficit.     Deep Tendon Reflexes: Reflexes  normal.    BP 128/78    Pulse 73    Temp 98.2 F (36.8 C)    Resp 16    Ht _0  (1.727 m)    Wt 189 lb 14.4 oz (86.1 kg)    SpO2 93%    BMI 28.87 kg/m  Wt Readings from Last 3 Encounters:  04/13/21 189 lb 14.4 oz (86.1 kg)  09/07/20 185 lb 3.2 oz (84 kg)  06/11/20 191 lb 9.6 oz (86.9 kg)     Health Maintenance Due  Topic Date Due   OPHTHALMOLOGY EXAM  04/30/2020   HEMOGLOBIN A1C  12/11/2020    There are no preventive care reminders to display for this patient.  Lab Results  Component Value Date   TSH 4.500 02/11/2019   Lab Results  Component Value Date   WBC 4.2 09/07/2020   HGB 16.9 09/07/2020   HCT 49.3 09/07/2020   MCV 93.3 09/07/2020   PLT 120.0 (L) 09/07/2020   Lab Results  Component Value Date   NA 139 09/07/2020   K 4.4 09/07/2020   CHLORIDE 104 12/02/2015   CO2 28 09/07/2020   GLUCOSE 139 (H) 09/07/2020   BUN 14 09/07/2020   CREATININE 0.87 09/07/2020   BILITOT 1.0 09/07/2020   ALKPHOS 53 09/07/2020   AST 21 09/07/2020   ALT 18 09/07/2020   PROT 6.5 09/07/2020   ALBUMIN 4.3 09/07/2020   CALCIUM 9.5 09/07/2020   ANIONGAP 8 08/30/2017   EGFR 71 (L) 12/02/2015   GFR 83.49 09/07/2020   Lab Results  Component Value Date   CHOL 127 09/07/2020   Lab Results  Component Value Date   HDL 48.10 09/07/2020   Lab Results  Component Value Date   LDLCALC 66 09/07/2020   Lab Results  Component Value Date   TRIG 64.0 09/07/2020   Lab Results  Component Value Date   CHOLHDL 3 09/07/2020   Lab Results  Component Value Date   HGBA1C 6.3 (A) 06/11/2020      Assessment & Plan:   Problem List Items Addressed This Visit       Unprioritized   Hyperlipidemia   Relevant Orders   Lipid panel   CMP   Type 2 diabetes mellitus with hypoglycemia without coma, with long-term current use of insulin (HCC) - Primary   Relevant Medications   metFORMIN (GLUCOPHAGE-XR) 500 MG 24  hr tablet   Other Relevant Orders   Lipid panel   Hemoglobin A1c   Other Visit Diagnoses     Physical exam       Relevant Orders   CBC with Differential/Platelet   Lipid panel   CMP     -Immunizations up-to-date -Obtain follow-up labs as above -Set up diabetic eye exam -Continue regular exercise habits -Continue annual flu vaccine -Set up 84-monthroutine medical follow-up.  Consider repeat urine microalbumin screen at that time  Meds ordered this encounter  Medications   metFORMIN (GLUCOPHAGE-XR) 500 MG 24 hr tablet    Sig: Take 1 tablet (500 mg total) by mouth daily with supper.    Dispense:  90 tablet    Refill:  3    Follow-up: Return in about 6 months (around 10/14/2021).    BCarolann Littler MD

## 2021-04-15 ENCOUNTER — Telehealth: Payer: Self-pay

## 2021-04-15 NOTE — Telephone Encounter (Signed)
Patient called back and was informed of results also stated he reviewed on MyChart  ?

## 2021-06-07 ENCOUNTER — Other Ambulatory Visit: Payer: Self-pay | Admitting: Family Medicine

## 2021-06-07 DIAGNOSIS — I10 Essential (primary) hypertension: Secondary | ICD-10-CM

## 2021-07-12 ENCOUNTER — Other Ambulatory Visit: Payer: Self-pay | Admitting: Family Medicine

## 2021-07-12 DIAGNOSIS — E7800 Pure hypercholesterolemia, unspecified: Secondary | ICD-10-CM

## 2021-07-22 ENCOUNTER — Telehealth: Payer: Self-pay | Admitting: Family Medicine

## 2021-07-22 DIAGNOSIS — Z794 Long term (current) use of insulin: Secondary | ICD-10-CM

## 2021-07-22 NOTE — Telephone Encounter (Signed)
Pt is calling and he needs order to have glucose, chole and triglycerides to be drawn at [facility] on tuesday . Pt needs order by Monday afternoon [facility] to stay on his wife insurance. Pt does not want his phone number listed in system please call him at [phone]. Please fax to [facility] at [phone]

## 2021-07-25 NOTE — Telephone Encounter (Signed)
Labs ordered and [facility]ed to [facility]

## 2021-10-14 ENCOUNTER — Encounter: Payer: Self-pay | Admitting: Family Medicine

## 2021-10-14 ENCOUNTER — Ambulatory Visit: Payer: No Typology Code available for payment source | Admitting: Family Medicine

## 2021-10-14 DIAGNOSIS — H6120 Impacted cerumen, unspecified ear: Secondary | ICD-10-CM

## 2021-10-14 DIAGNOSIS — I10 Essential (primary) hypertension: Secondary | ICD-10-CM

## 2021-10-14 DIAGNOSIS — E11649 Type 2 diabetes mellitus with hypoglycemia without coma: Secondary | ICD-10-CM | POA: Diagnosis not present

## 2021-10-14 DIAGNOSIS — E785 Hyperlipidemia, unspecified: Secondary | ICD-10-CM

## 2021-10-14 DIAGNOSIS — Z794 Long term (current) use of insulin: Secondary | ICD-10-CM

## 2021-10-14 NOTE — Progress Notes (Signed)
Established Patient Office Visit  Subjective   Patient ID: David Wickens., male    DOB: 07/12/1945  Age: 76 y.o. MRN: [id number]  Chief Complaint  Patient presents with   Follow-up    HPI   Oluwadamilare is here for routine medical follow-up.  He has a history of hypertension, CAD, type 2 diabetes, hyperlipidemia.  He states generally doing well.  No recent chest pains.  No dizziness.  Blood pressures been well controlled.  He actually had A1c done in June outside of here and it was 6.7%.  Denies any recent hypoglycemic symptoms on his glipizide.  Also remains on metformin and Basaglar.  Recently has had some clear to thin wax drainage from both ears.  No ear pain.  No hearing changes.  Past Medical History:  Diagnosis Date   AML (acute myeloid leukemia) in remission [facility]) oncologist-  dr Alvy Bimler-- per last note 10/ 2017 in remission 2 years   dx 09-11-2013  via bone marrow bx , FLT3 negative (NP M1 +)/  chemotherapy started 09-17-2013,  remission via marrow bx 11-06-2013,  4 cycles consolidation chemo w/ HiDAC 11-13-2013 to 03-05-2014   BPH with urinary obstruction    Chronic thrombocytopenic purpura ([place])    Dental caries    periodontitis   GERD (gastroesophageal reflux disease)    History of kidney stones    Hyperlipidemia    Hypertension    MVP (mitral valve prolapse)    Pancytopenia, acquired ([place])    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   Complex valvuloplasty including triangular resection of flail segment of posterior leaflet, artificial Gore-tex neochord placement x6 and 60m Sorin Memo 3D ring annuloplasty via right mini thoracotomy approach   Severe mitral regurgitation    Type 2 diabetes mellitus with hypoglycemia, with long-term current use of insulin ([facility]    endocrinologist-  dr gCon Memos  Wears denture    upper   Past Surgical History:  Procedure Laterality Date   CARDIAC CATHETERIZATION     CARDIOVASCULAR STRESS TEST  07/23/2013   Low risk  nuclear study w/ mild inferior ischemia/  normal LV function and wall motion, ef 69%   CYSTOSCOPY WITH RETROGRADE PYELOGRAM, URETEROSCOPY AND STENT PLACEMENT Right 04/20/2016   Procedure: CYSTOSCOPY WITH RETROGRADE PYELOGRAM, URETEROSCOPY , STONE BASKETRY AND STENT PLACEMENT;  Surgeon: SFranchot Gallo MD;  [facility]  Service: Urology;  Laterality: Right;   EXTRACORPOREAL SHOCK WAVE LITHOTRIPSY  yrs ago   HOLMIUM LASER APPLICATION Right 35/04/9765  Procedure: HOLMIUM LASER APPLICATION;  Surgeon: SFranchot Gallo MD;  [facility]  Service: Urology;  Laterality: Right;   INGUINAL HERNIA REPAIR Left 1990's   MITRAL VALVE REPAIR N/A 06/21/2016   Procedure: MINIMALLY INVASIVE MITRAL VALVE REPAIR (MVR) USING SORIN MEMO 3D SIZE 32 SEMIRIGID ANNULOPLASTY RING;  Surgeon: ORexene Alberts MD;  [place]  Service: Open Heart Surgery;  Laterality: N/A;   MULTIPLE EXTRACTIONS WITH ALVEOLOPLASTY N/A 06/08/2016   Procedure: MULTIPLE EXTRACTION WITH ALVEOLOPLASTY AND PRE-PROSTHETIC SURGERY AS NEEDED;  Surgeon: RLenn Cal DDS;  [place]  Service: Oral Surgery;  Laterality: N/A;   PATENT FORAMEN OVALE(PFO) CLOSURE N/A 06/21/2016   Procedure: PATENT FORAMEN OVALE (PFO) CLOSURE;  Surgeon: ORexene Alberts MD;  [place]  Service: Open Heart Surgery;  Laterality: N/A;   RIGHT/LEFT HEART CATH AND CORONARY ANGIOGRAPHY N/A 06/07/2016   Procedure: Right/Left Heart Cath and Coronary Angiography;  Surgeon: MSherren Mocha MD;  Location: [place] CV LAB;  Service: Cardiovascular;  Laterality: N/A;   ROTATOR CUFF REPAIR Right [id number]   TEE WITHOUT CARDIOVERSION N/A 01/11/2016   Procedure: TRANSESOPHAGEAL ECHOCARDIOGRAM (TEE);  Surgeon: Pixie Casino, MD;  Location: [facility] ENDOSCOPY;  Service: Cardiovascular;  Laterality: N/A; Severe MR associated w/ flail P2 segment of MV;  no LAA thromus;  small PFO by saline microbubble contrast after  valsalva; LVEF 55-60%   TEE WITHOUT CARDIOVERSION N/A 06/21/2016   Procedure: TRANSESOPHAGEAL ECHOCARDIOGRAM (TEE);  Surgeon: Rexene Alberts, MD;  Location: [place];  Service: Open Heart Surgery;  Laterality: N/A;   TENNIS ELBOW RELEASE/NIRSCHEL PROCEDURE Right 1990's   TRANSTHORACIC ECHOCARDIOGRAM  12/22/2015   grade 1 diastolic dysfunction,  ef 55-60%/  moderate MVP involving posterior leaflet w/ partial flail and severe MR via CW doppler (peak gradient 44mHg)/  trivial TR    reports that he quit smoking about 47 years ago. His smoking use included cigarettes. He has a 30.00 pack-year smoking history. He has never used smokeless tobacco. He reports that he does not drink alcohol and does not use drugs. family history includes Alcoholism in his father; Asthma in his sister; Cancer in his daughter; Heart attack in his father; Obesity in his father. Allergies  Allergen Reactions   Prochlorperazine Other (See Comments)    ARRHYTHMIAS   Morphine And Related     Shuts bladder down.   Other     Review of Systems  Constitutional:  Negative for malaise/fatigue.  Eyes:  Negative for blurred vision.  Respiratory:  Negative for shortness of breath.   Cardiovascular:  Negative for chest pain.  Neurological:  Negative for dizziness, weakness and headaches.      Objective:     There were no vitals taken for this visit. BP Readings from Last 3 Encounters:  04/13/21 128/78  09/07/20 138/82  06/11/20 130/82   Wt Readings from Last 3 Encounters:  04/13/21 189 lb 14.4 oz (86.1 kg)  09/07/20 185 lb 3.2 oz (84 kg)  06/11/20 191 lb 9.6 oz (86.9 kg)      Physical Exam Constitutional:      Appearance: He is well-developed.  HENT:     Ears:     Comments: He has minimal cerumen in both ear canals.  Eardrums appear normal. Eyes:     Pupils: Pupils are equal, round, and reactive to light.  Neck:     Thyroid: No thyromegaly.  Cardiovascular:     Rate and Rhythm: Normal rate and regular  rhythm.  Pulmonary:     Effort: Pulmonary effort is normal. No respiratory distress.     Breath sounds: Normal breath sounds. No wheezing or rales.  Musculoskeletal:     Cervical back: Neck supple.  Neurological:     Mental Status: He is alert and oriented to person, place, and time.      No results found for any visits on 10/14/21.    The ASCVD Risk score (Arnett DK, et al., 2019) failed to calculate for the following reasons:   The valid total cholesterol range is 130 to 320 mg/dL    Assessment & Plan:   #1 hypertension stable and at goal.  Continue current medications  #2 type 2 diabetes.  Good control with recent A1c 3 months ago 6.7%.  Continue metformin, Basaglar, and glipizide.  Continue regular exercise habits  #3 hyperlipidemia treated with simvastatin.  Continue current dose of simvastatin and continue low saturated fat diet.  Recheck lipids at 671-monthollow-up.  #  4 cerumen both ear canals.  Minimal and not obstructing.  Reassurance.  Return in about 6 months (around 04/14/2022).    Carolann Littler, MD

## 2021-10-19 ENCOUNTER — Other Ambulatory Visit: Payer: Self-pay | Admitting: Internal Medicine

## 2022-01-01 ENCOUNTER — Other Ambulatory Visit: Payer: Self-pay | Admitting: Internal Medicine

## 2022-01-04 ENCOUNTER — Other Ambulatory Visit: Payer: Self-pay | Admitting: Family Medicine

## 2022-01-06 ENCOUNTER — Other Ambulatory Visit: Payer: Self-pay | Admitting: Internal Medicine

## 2022-01-07 ENCOUNTER — Other Ambulatory Visit: Payer: Self-pay | Admitting: Family Medicine

## 2022-01-07 DIAGNOSIS — E7800 Pure hypercholesterolemia, unspecified: Secondary | ICD-10-CM

## 2022-01-09 ENCOUNTER — Other Ambulatory Visit: Payer: Self-pay | Admitting: Family Medicine

## 2022-01-09 NOTE — Telephone Encounter (Signed)
Pt called in reqeusting to speak with the office manager. When asked the nature of the call patient stated he was having an issue with an prescription. I offered to assist by checking for prescription refill requests. Chart history shows the prescription request initially went to his Endo doc, Dr Cruzita Lederer on 01/01/22 and was routed to Dr Elease Hashimoto on 01/04/2022. Explained to patient that the message reached [place] on Wednesday 01/04/22 and we ask they allow 72 hours for refills. Patient was still angry at the time taken to fill his prescription. Advised I would pass his message on to the Office Manager when she returned. Patient pressed to find out specifically when she would return and I explained that I was unsure. He then said if he didn't hear from her by lunch tomorrow that he would assume she was not given the message and he would come up here. I told him we would be here and he said you don't want me to come up there in person, to which I replied "we will just call the police". Office manage will be made aware.

## 2022-01-10 ENCOUNTER — Telehealth: Payer: Self-pay

## 2022-01-10 MED ORDER — GLIPIZIDE 5 MG PO TABS: ORAL_TABLET | ORAL | 3 refills | Status: DC

## 2022-01-10 NOTE — Telephone Encounter (Signed)
Received a message patient wanted to speak with Practice Admin called patient back No answer.

## 2022-01-10 NOTE — Telephone Encounter (Signed)
Pt called to advise he had resumed diabetic management with his PCP Dr Elease Hashimoto but his diabetic medications have been denied for refill. Pt advised he attempted to contact the office and could not get through to speak with anyone. Pt states he was advised by the pharmacy that his medications are being denied and he requested someone from [place] assist him in figuring out what to do. Pt discontinued care with Dr Cruzita Lederer and [facility] after his last appointment in April of 2022. Pt called in February of 2023 and was given his last 90 day refill of medication and he confirmed his PCP would be continuing his diabetic care. Pt is requesting a refill for Glipizide, Pen Needles, and Test [facility] [address]

## 2022-01-10 NOTE — Telephone Encounter (Signed)
Glipizide refilled for one year.    [name] MD [facility]

## 2022-01-12 ENCOUNTER — Other Ambulatory Visit: Payer: Self-pay | Admitting: Family Medicine

## 2022-01-17 ENCOUNTER — Telehealth: Payer: Self-pay | Admitting: Family Medicine

## 2022-01-17 MED ORDER — ACCU-CHEK GUIDE VI STRP: ORAL_STRIP | 5 refills | Status: DC

## 2022-01-17 MED ORDER — BD PEN NEEDLE NANO U/F 32G X 4 MM MISC: 3 refills | Status: DC

## 2022-01-17 NOTE — Telephone Encounter (Signed)
Rx sent 

## 2022-01-17 NOTE — Telephone Encounter (Signed)
I spoke with the patient and he requested refill on Test strips and pen needles. Patient also stated he has not received a call from [facility] in regards to previous encounter from last week.

## 2022-01-17 NOTE — Telephone Encounter (Signed)
Pt called wanting to see if Dr. Elease Hashimoto can give him a call. He is having problems with his Rx. Would like a call as soon as he can to rectify this issue.  Call pt at [phone]  Pt does not want phone number on file.   Please advise.

## 2022-01-18 ENCOUNTER — Telehealth: Payer: Self-pay | Admitting: Family Medicine

## 2022-01-18 ENCOUNTER — Other Ambulatory Visit: Payer: Self-pay | Admitting: Family Medicine

## 2022-01-18 MED ORDER — ACCU-CHEK GUIDE VI STRP: ORAL_STRIP | 5 refills | Status: DC

## 2022-01-18 NOTE — Telephone Encounter (Signed)
David Carr is calling and glucose blood (ACCU-CHEK GUIDE) test strip needs Dx Code  [facility] [id number] [place] [name], [place] Phone: [phone]  Fax: [phone]

## 2022-01-18 NOTE — Telephone Encounter (Signed)
Rx sent 

## 2022-01-24 NOTE — Telephone Encounter (Signed)
Made an attempt to call patient with no response. VM left for patient to call office back.

## 2022-01-24 NOTE — Telephone Encounter (Signed)
Spoke with patient. He was able to address his concerns. The patient agreed he would now make RX request a week prior being he has a 3 day grace period with insurance.

## 2022-02-28 ENCOUNTER — Other Ambulatory Visit: Payer: Self-pay | Admitting: Family Medicine

## 2022-02-28 DIAGNOSIS — I10 Essential (primary) hypertension: Secondary | ICD-10-CM

## 2022-04-03 ENCOUNTER — Other Ambulatory Visit: Payer: Self-pay | Admitting: Family Medicine

## 2022-04-03 ENCOUNTER — Telehealth: Payer: Self-pay | Admitting: Family Medicine

## 2022-04-03 MED ORDER — METFORMIN HCL ER 500 MG PO TB24: 500.0000 mg | ORAL_TABLET | Freq: Every day | ORAL | 0 refills | Status: DC

## 2022-04-03 NOTE — Telephone Encounter (Signed)
Pt is due for med refill on 04-11-2022 and would like new rx plain lantus kwikpen ID [id number] and metFORMIN (GLUCOPHAGE-XR) 500 MG 24 hr tablet . Pt does not want Insulin Glargine [facility] 100 UNIT/ML  [facility] [address] [place] [name] [place] Phone: [phone]  Fax: [phone]    Pt also would like dr burchette to call him back and does not want to put his phone  number in system

## 2022-04-04 NOTE — Telephone Encounter (Signed)
[facility] and medication has been called into the pharmacy per request. Patient will be notified once prescription is ready.

## 2022-04-06 ENCOUNTER — Telehealth: Payer: Self-pay | Admitting: Family Medicine

## 2022-04-06 NOTE — Telephone Encounter (Signed)
Pt is calling and need new rx BD micro .31 or .30 syringes #90  [id number] [name], [place] Phone: [phone]  Fax: [phone]

## 2022-04-07 MED ORDER — "INSULIN SYRINGE-NEEDLE U-100 31G X 5/16"" 0.3 ML MISC": 0 refills | Status: AC

## 2022-04-07 NOTE — Telephone Encounter (Signed)
Rx sent 

## 2022-04-17 ENCOUNTER — Encounter: Payer: Self-pay | Admitting: Family Medicine

## 2022-04-17 ENCOUNTER — Ambulatory Visit (INDEPENDENT_AMBULATORY_CARE_PROVIDER_SITE_OTHER): Payer: Medicare HMO | Admitting: Family Medicine

## 2022-04-17 VITALS — BP 118/70 | HR 70 | Temp 98.3°F | Ht 67.72 in | Wt 189.8 lb

## 2022-04-17 DIAGNOSIS — Z794 Long term (current) use of insulin: Secondary | ICD-10-CM | POA: Diagnosis not present

## 2022-04-17 DIAGNOSIS — E11649 Type 2 diabetes mellitus with hypoglycemia without coma: Secondary | ICD-10-CM

## 2022-04-17 DIAGNOSIS — Z0000 Encounter for general adult medical examination without abnormal findings: Secondary | ICD-10-CM | POA: Diagnosis not present

## 2022-04-17 NOTE — Patient Instructions (Signed)
Set up dermatology appointment regarding the left hand lesion  Set up diabetic eye exam

## 2022-04-17 NOTE — Addendum Note (Signed)
Addended by: Nilda Riggs on: 04/17/2022 08:40 AM   Modules accepted: Orders

## 2022-04-17 NOTE — Progress Notes (Signed)
Established Patient Office Visit  Subjective   Patient ID: David Tee., male    DOB: [birth year]  Age: 77 y.o. MRN: [id number]  Chief Complaint  Patient presents with   Annual Exam    HPI    There has history of CAD, hypertension, type 2 diabetes, hyperlipidemia, remote history of acute myeloid leukemia, and history of minimally invasive mitral valve repair.  Seen today for physical exam.  His current medication include low-dose atenolol, metformin, glipizide, simvastatin, Basaglar insulin.  No recent hypoglycemic symptoms.  His blood sugars have been very well-controlled overall.  He is overdue for eye exam.  Denies any recent chest pains.  Occasional lightheadedness but not consistently positional.  No difficulties with bowel movements or with urination  Health maintenance reviewed  -Flu vaccine already given -Pneumonia vaccines and Shingrix complete -Tetanus due 2031 -Needs diabetic eye exam -Aged out of further colonoscopies  Family history and social history reviewed with no significant changes. Review of Systems  Constitutional:  Negative for malaise/fatigue.  Eyes:  Negative for blurred vision.  Respiratory:  Negative for cough and shortness of breath.   Cardiovascular:  Negative for chest pain.  Gastrointestinal:  Negative for abdominal pain.  Genitourinary:  Negative for dysuria.  Neurological:  Negative for dizziness, weakness and headaches.  Psychiatric/Behavioral:  Negative for depression.       Objective:     BP 118/70 (BP Location: Left Arm, Patient Position: Sitting, Cuff Size: Normal)   Pulse 70   Temp 98.3 F (36.8 C) (Oral)   Ht 5' 7.72" (1.72 m)   Wt 189 lb 12.8 oz (86.1 kg)   SpO2 98%   BMI 29.10 kg/m  BP Readings from Last 3 Encounters:  04/17/22 118/70  04/13/21 128/78  09/07/20 138/82   Wt Readings from Last 3 Encounters:  04/17/22 189 lb 12.8 oz (86.1 kg)  04/13/21 189 lb 14.4 oz (86.1 kg)  09/07/20 185 lb 3.2 oz (84 kg)       Physical Exam Constitutional:      General: He is not in acute distress.    Appearance: He is well-developed.  HENT:     Head: Normocephalic and atraumatic.     Right Ear: External ear normal.     Left Ear: External ear normal.  Eyes:     Conjunctiva/sclera: Conjunctivae normal.     Pupils: Pupils are equal, round, and reactive to light.  Neck:     Thyroid: No thyromegaly.  Cardiovascular:     Rate and Rhythm: Normal rate and regular rhythm.     Heart sounds: Normal heart sounds. No murmur heard. Pulmonary:     Effort: No respiratory distress.     Breath sounds: No wheezing or rales.  Abdominal:     General: Bowel sounds are normal. There is no distension.     Palpations: Abdomen is soft. There is no mass.     Tenderness: There is no abdominal tenderness. There is no guarding or rebound.     Hernia: A hernia is present.     Comments: Umbilical hernia which is soft and nontender  Musculoskeletal:     Cervical back: Normal range of motion and neck supple.  Lymphadenopathy:     Cervical: No cervical adenopathy.  Skin:    Comments: Some left hand reveals nodular hyperkeratotic lesion which is about 1 cm diameter.  Feet reveal no skin lesions. Good distal foot pulses. Good capillary refill. No calluses. Normal sensation with monofilament testing   Neurological:  Mental Status: He is alert and oriented to person, place, and time.     Cranial Nerves: No cranial nerve deficit.      No results found for any visits on 04/17/22.    The ASCVD Risk score (Arnett DK, et al., 2019) failed to calculate for the following reasons:   The valid total cholesterol range is 130 to 320 mg/dL    Assessment & Plan:   #1 physical exam.  We discussed the following health maintenance issues  -Vaccines up-to-date -Aged out of further colonoscopies -Needs to set up diabetic eye exam -Try to increase walking.  He usually walks thousands of steps per day but has been more sedentary  over the wintertime.  #2 nodular lesion dorsum left hand.  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  He will set up to see dermatologist.  He is planning to see dermatologist that his wife has seen in [place]  #3 type 2 diabetes.  Needs diabetic eye exam and he will set up.  Check urine microalbumin and A1c.  Set up 31-monthfollow-up   Return in about 6 months (around 10/18/2022).    BCarolann Littler MD

## 2022-04-24 ENCOUNTER — Other Ambulatory Visit: Payer: Medicare HMO

## 2022-04-24 DIAGNOSIS — Z0000 Encounter for general adult medical examination without abnormal findings: Secondary | ICD-10-CM

## 2022-04-24 DIAGNOSIS — Z794 Long term (current) use of insulin: Secondary | ICD-10-CM

## 2022-04-24 LAB — CBC WITH DIFFERENTIAL/PLATELET
Basophils Absolute: 0 10*3/uL (ref 0.0–0.1)
Basophils Relative: 0.3 % (ref 0.0–3.0)
Eosinophils Absolute: 0 10*3/uL (ref 0.0–0.7)
Eosinophils Relative: 0.5 % (ref 0.0–5.0)
HCT: 51.5 % (ref 39.0–52.0)
Hemoglobin: 17.9 g/dL — ABNORMAL HIGH (ref 13.0–17.0)
Lymphocytes Relative: 27 % (ref 12.0–46.0)
Lymphs Abs: 1.6 10*3/uL (ref 0.7–4.0)
MCHC: 34.7 g/dL (ref 30.0–36.0)
MCV: 93.4 fl (ref 78.0–100.0)
Monocytes Absolute: 0.5 10*3/uL (ref 0.1–1.0)
Monocytes Relative: 7.8 % (ref 3.0–12.0)
Neutro Abs: 3.8 10*3/uL (ref 1.4–7.7)
Neutrophils Relative %: 64.4 % (ref 43.0–77.0)
Platelets: 132 10*3/uL — ABNORMAL LOW (ref 150.0–400.0)
RBC: 5.52 Mil/uL (ref 4.22–5.81)
RDW: 14.3 % (ref 11.5–15.5)
WBC: 5.8 10*3/uL (ref 4.0–10.5)

## 2022-04-24 LAB — MICROALBUMIN / CREATININE URINE RATIO
Creatinine,U: 48.8 mg/dL
Microalb Creat Ratio: 2.7 mg/g (ref 0.0–30.0)
Microalb, Ur: 1.3 mg/dL (ref 0.0–1.9)

## 2022-04-24 LAB — COMPREHENSIVE METABOLIC PANEL
ALT: 30 U/L (ref 0–53)
AST: 29 U/L (ref 0–37)
Albumin: 4.1 g/dL (ref 3.5–5.2)
Alkaline Phosphatase: 59 U/L (ref 39–117)
BUN: 20 mg/dL (ref 6–23)
CO2: 30 mEq/L (ref 19–32)
Calcium: 10.1 mg/dL (ref 8.4–10.5)
Chloride: 103 mEq/L (ref 96–112)
Creatinine, Ser: 0.94 mg/dL (ref 0.40–1.50)
GFR: 77.55 mL/min (ref >60.00)
Glucose, Bld: 120 mg/dL — ABNORMAL HIGH (ref 70–99)
Potassium: 4.8 mEq/L (ref 3.5–5.1)
Sodium: 142 mEq/L (ref 135–145)
Total Bilirubin: 0.9 mg/dL (ref 0.2–1.2)
Total Protein: 6.9 g/dL (ref 6.0–8.3)

## 2022-04-24 LAB — LIPID PANEL
Cholesterol: 143 mg/dL (ref 0–200)
HDL: 51.4 mg/dL (ref >39.00)
LDL Cholesterol: 73 mg/dL (ref 0–99)
NonHDL: 91.15
Total CHOL/HDL Ratio: 3
Triglycerides: 89 mg/dL (ref 0.0–149.0)
VLDL: 17.8 mg/dL (ref 0.0–40.0)

## 2022-04-24 LAB — HEMOGLOBIN A1C: Hgb A1c MFr Bld: 7 % — ABNORMAL HIGH (ref 4.6–6.5)

## 2022-05-04 DIAGNOSIS — L853 Xerosis cutis: Secondary | ICD-10-CM | POA: Diagnosis not present

## 2022-05-04 DIAGNOSIS — D2239 Melanocytic nevi of other parts of face: Secondary | ICD-10-CM | POA: Diagnosis not present

## 2022-05-04 DIAGNOSIS — L300 Nummular dermatitis: Secondary | ICD-10-CM | POA: Diagnosis not present

## 2022-05-04 DIAGNOSIS — L280 Lichen simplex chronicus: Secondary | ICD-10-CM | POA: Diagnosis not present

## 2022-05-10 ENCOUNTER — Telehealth: Payer: Self-pay | Admitting: Family Medicine

## 2022-05-10 NOTE — Telephone Encounter (Signed)
Contacted David Carr. to schedule their annual wellness visit. Welcome to Medicare visit Due by 01/14/23. David Carr AWV direct phone # [phone]   WTM before 01/14/23 per [facility]

## 2022-06-22 ENCOUNTER — Other Ambulatory Visit: Payer: Self-pay | Admitting: Family Medicine

## 2022-06-22 ENCOUNTER — Other Ambulatory Visit: Payer: Self-pay | Admitting: Family

## 2022-06-22 MED ORDER — INSULIN DETEMIR 100 UNIT/ML ~~LOC~~ SOLN: 16.0000 [IU] | Freq: Every day | SUBCUTANEOUS | 11 refills | Status: DC

## 2022-06-30 ENCOUNTER — Other Ambulatory Visit: Payer: Self-pay | Admitting: Family Medicine

## 2022-06-30 DIAGNOSIS — E7800 Pure hypercholesterolemia, unspecified: Secondary | ICD-10-CM

## 2022-07-02 ENCOUNTER — Other Ambulatory Visit: Payer: Self-pay | Admitting: Family Medicine

## 2022-07-03 ENCOUNTER — Telehealth: Payer: Self-pay | Admitting: Family Medicine

## 2022-07-03 NOTE — Telephone Encounter (Signed)
Pt switched to a different med from [facility] on 5/9 due to INS but now pt would like to switch back to Basaglar 100 unit/ML Kwikpen and for [facility]  [id number] [place] [place] [facility] [address] [facility] Phone: [phone]  Fax: [phone]     Please advise.

## 2022-07-07 ENCOUNTER — Other Ambulatory Visit: Payer: Self-pay | Admitting: Family

## 2022-07-07 MED ORDER — INSULIN GLARGINE 100 UNIT/ML ~~LOC~~ SOLN: SUBCUTANEOUS | 11 refills | Status: DC

## 2022-07-07 MED ORDER — BASAGLAR KWIKPEN 100 UNIT/ML ~~LOC~~ SOPN: 16.0000 [IU] | PEN_INJECTOR | Freq: Every day | SUBCUTANEOUS | 3 refills | Status: DC

## 2022-07-07 MED ORDER — INSULIN DETEMIR 100 UNIT/ML ~~LOC~~ SOLN: 16.0000 [IU] | Freq: Every day | SUBCUTANEOUS | 11 refills | Status: DC

## 2022-07-07 NOTE — Telephone Encounter (Signed)
Patient informed of the message below.

## 2022-07-07 NOTE — Telephone Encounter (Signed)
Pt requesting a call to let him know when this has been completed.

## 2022-07-07 NOTE — Telephone Encounter (Signed)
[facility] initially sent both Rxs on 5/9 and there is a note stating these were received at 3:05pm electronically.  She stated the Rxs were not received and is aware these were re-sent and I informed the patient also.  Patient stated he does not want either of these and would prefer Basaglar instead as he used this before.  Message sent to [facility].

## 2022-07-07 NOTE — Telephone Encounter (Signed)
Pt is calling and will run out of insulin this weekend and need medication today. Patient would like a callback at [phone]. Please do not put pt number in system

## 2022-08-28 DIAGNOSIS — M1711 Unilateral primary osteoarthritis, right knee: Secondary | ICD-10-CM | POA: Diagnosis not present

## 2022-10-05 ENCOUNTER — Other Ambulatory Visit: Payer: Self-pay | Admitting: Family Medicine

## 2022-10-05 DIAGNOSIS — E7800 Pure hypercholesterolemia, unspecified: Secondary | ICD-10-CM

## 2022-10-22 ENCOUNTER — Encounter (HOSPITAL_COMMUNITY): Payer: Self-pay | Admitting: Emergency Medicine

## 2022-10-22 ENCOUNTER — Other Ambulatory Visit: Payer: Self-pay

## 2022-10-22 ENCOUNTER — Emergency Department (HOSPITAL_COMMUNITY)
Admission: EM | Admit: 2022-10-22 | Discharge: 2022-10-22 | Disposition: A | Discharge status: Home / Self Care | Payer: Medicare HMO | Attending: Emergency Medicine | Admitting: Emergency Medicine

## 2022-10-22 ENCOUNTER — Emergency Department (HOSPITAL_COMMUNITY): Payer: Medicare HMO

## 2022-10-22 DIAGNOSIS — N202 Calculus of kidney with calculus of ureter: Secondary | ICD-10-CM | POA: Diagnosis not present

## 2022-10-22 DIAGNOSIS — Z794 Long term (current) use of insulin: Secondary | ICD-10-CM | POA: Diagnosis not present

## 2022-10-22 DIAGNOSIS — N200 Calculus of kidney: Secondary | ICD-10-CM | POA: Diagnosis not present

## 2022-10-22 DIAGNOSIS — K4020 Bilateral inguinal hernia, without obstruction or gangrene, not specified as recurrent: Secondary | ICD-10-CM | POA: Diagnosis not present

## 2022-10-22 DIAGNOSIS — N132 Hydronephrosis with renal and ureteral calculous obstruction: Secondary | ICD-10-CM | POA: Diagnosis not present

## 2022-10-22 DIAGNOSIS — K5730 Diverticulosis of large intestine without perforation or abscess without bleeding: Secondary | ICD-10-CM | POA: Diagnosis not present

## 2022-10-22 DIAGNOSIS — N401 Enlarged prostate with lower urinary tract symptoms: Secondary | ICD-10-CM | POA: Diagnosis not present

## 2022-10-22 DIAGNOSIS — R1032 Left lower quadrant pain: Secondary | ICD-10-CM | POA: Diagnosis not present

## 2022-10-22 DIAGNOSIS — I10 Essential (primary) hypertension: Secondary | ICD-10-CM | POA: Diagnosis not present

## 2022-10-22 DIAGNOSIS — Z7984 Long term (current) use of oral hypoglycemic drugs: Secondary | ICD-10-CM | POA: Diagnosis not present

## 2022-10-22 LAB — URINALYSIS, ROUTINE W REFLEX MICROSCOPIC
Bilirubin Urine: NEGATIVE
Glucose, UA: NEGATIVE mg/dL
Ketones, ur: 20 mg/dL — AB
Leukocytes,Ua: NEGATIVE
Nitrite: NEGATIVE
Protein, ur: 30 mg/dL — AB
Specific Gravity, Urine: 1.028 (ref 1.005–1.030)
pH: 5 (ref 5.0–8.0)

## 2022-10-22 LAB — CBC
HCT: 47 % (ref 39.0–52.0)
Hemoglobin: 16.4 g/dL (ref 13.0–17.0)
MCH: 32.6 pg (ref 26.0–34.0)
MCHC: 34.9 g/dL (ref 30.0–36.0)
MCV: 93.4 fL (ref 80.0–100.0)
Platelets: 132 10*3/uL — ABNORMAL LOW (ref 150–400)
RBC: 5.03 MIL/uL (ref 4.22–5.81)
RDW: 13 % (ref 11.5–15.5)
WBC: 11.6 10*3/uL — ABNORMAL HIGH (ref 4.0–10.5)
nRBC: 0 % (ref 0.0–0.2)

## 2022-10-22 LAB — BASIC METABOLIC PANEL
Anion gap: 10 (ref 5–15)
BUN: 24 mg/dL — ABNORMAL HIGH (ref 8–23)
CO2: 23 mmol/L (ref 22–32)
Calcium: 9.4 mg/dL (ref 8.9–10.3)
Chloride: 103 mmol/L (ref 98–111)
Creatinine, Ser: 1.48 mg/dL — ABNORMAL HIGH (ref 0.61–1.24)
GFR, Estimated: 48 mL/min — ABNORMAL LOW (ref >60)
Glucose, Bld: 134 mg/dL — ABNORMAL HIGH (ref 70–99)
Potassium: 4.1 mmol/L (ref 3.5–5.1)
Sodium: 136 mmol/L (ref 135–145)

## 2022-10-22 MED ORDER — OXYCODONE-ACETAMINOPHEN 5-325 MG PO TABS
1.0000 | ORAL_TABLET | Freq: Four times a day (QID) | ORAL | 0 refills | Status: DC | PRN
Start: 2022-10-22 — End: 2023-06-11

## 2022-10-22 MED ORDER — TAMSULOSIN HCL 0.4 MG PO CAPS: 0.4000 mg | ORAL_CAPSULE | Freq: Every day | ORAL | 0 refills | Status: DC

## 2022-10-22 MED ORDER — KETOROLAC TROMETHAMINE 30 MG/ML IJ SOLN
30.0000 mg | Freq: Once | INTRAMUSCULAR | Status: AC
  Administered 2022-10-22: 30 mg via INTRAMUSCULAR
  Filled 2022-10-22: qty 1

## 2022-10-22 MED ORDER — ONDANSETRON 4 MG PO TBDP: ORAL_TABLET | ORAL | 0 refills | Status: AC

## 2022-10-22 NOTE — ED Triage Notes (Signed)
Pt c/o left sided flank pain since last night with nausea.

## 2022-10-22 NOTE — Discharge Instructions (Signed)
Follow-up with the urologist this week..  Return if pain is not controlled or vomiting or fever.

## 2022-10-22 NOTE — ED Provider Notes (Signed)
[place] EMERGENCY DEPARTMENT AT [facility] Provider Note   CSN: [id number] Arrival date & time: 10/22/22  1954     History {Add pertinent medical, surgical, social history, OB history to HPI:1} Chief Complaint  Patient presents with   Flank Pain    David Carr. is a 77 y.o. male.  Patient is a history of hypertension and hyperlipidemia.   Flank Pain       Home Medications Prior to Admission medications   Medication Sig Start Date End Date Taking? Authorizing Provider  ondansetron (ZOFRAN-ODT) 4 MG disintegrating tablet 4mg  ODT q4 hours prn nausea/vomit 10/22/22  Yes Bethann Berkshire, MD  oxyCODONE-acetaminophen (PERCOCET) 5-325 MG tablet Take 1 tablet by mouth every 6 (six) hours as needed. 10/22/22  Yes Bethann Berkshire, MD  tamsulosin (FLOMAX) 0.4 MG CAPS capsule Take 1 capsule (0.4 mg total) by mouth daily. 10/22/22  Yes Bethann Berkshire, MD  acetaminophen (TYLENOL) 500 MG tablet Take 2 tablets (1,000 mg total) by mouth every 6 (six) hours as needed. 06/26/16   Barrett, Erin R, PA-C  aspirin 81 MG tablet Take 81 mg by mouth as needed.     [provider]  atenolol (TENORMIN) 25 MG tablet TAKE 1 TABLET (25 MG TOTAL) BY MOUTH DAILY. 02/28/22   Burchette, Elberta Fortis, MD  glipiZIDE (GLUCOTROL) 5 MG tablet Take one tablet by mouth daily with breakfast. 01/10/22   Burchette, Elberta Fortis, MD  glucose blood (ACCU-CHEK GUIDE) test strip USE TO CHECK BLOOD SUGAR 3 TIMES A DAY 01/18/22   Burchette, Elberta Fortis, MD  Insulin Glargine (BASAGLAR KWIKPEN) 100 UNIT/ML Inject 16 Units into the skin at bedtime. 07/07/22   Worthy Rancher B, FNP  Insulin Pen Needle (BD PEN NEEDLE NANO U/F) 32G X 4 MM MISC USE 4 TIMES A DAY 01/17/22   Burchette, Elberta Fortis, MD  Insulin Syringe-Needle U-100 (B-D INSULIN SYRINGE) 31G X 5/16" 0.3 ML MISC Use as directed daily 04/07/22   Burchette, Elberta Fortis, MD  ipratropium (ATROVENT) 0.06 % nasal spray Place 2 sprays into both nostrils 4 (four) times daily. 11/01/20    Burchette, Elberta Fortis, MD  Lancets (ACCU-CHEK MULTICLIX) lancets Use as instructed to check blood sugar 3 times a day. 05/09/18   Carlus Pavlov, MD  metFORMIN (GLUCOPHAGE-XR) 500 MG 24 hr tablet TAKE 1 TABLET BY MOUTH DAILY WITH SUPPER 10/06/22   Burchette, Elberta Fortis, MD  simvastatin (ZOCOR) 20 MG tablet TAKE 1 TABLET BY MOUTH EVERYDAY AT BEDTIME 10/06/22   Burchette, Elberta Fortis, MD  triamcinolone cream (KENALOG) 0.5 % Apply to affected skin rash twice daily as needed 03/24/20   Kristian Covey, MD      Allergies    Prochlorperazine, Morphine and codeine, and Other    Review of Systems   Review of Systems  Genitourinary:  Positive for flank pain.    Physical Exam Updated Vital Signs BP 127/79 (BP Location: Right Arm)   Pulse 84   Temp 98.8 F (37.1 C) (Oral)   Resp 18   Ht 5\' 7"  (1.702 m)   Wt 85.3 kg   SpO2 97%   BMI 29.44 kg/m  Physical Exam  ED Results / Procedures / Treatments   Labs (all labs ordered are listed, but only abnormal results are displayed) Labs Reviewed  URINALYSIS, ROUTINE W REFLEX MICROSCOPIC - Abnormal; Notable for the following components:      Result Value   Hgb urine dipstick MODERATE (*)    Ketones, ur 20 (*)  Protein, ur 30 (*)    Bacteria, UA RARE (*)    All other components within normal limits  BASIC METABOLIC PANEL - Abnormal; Notable for the following components:   Glucose, Bld 134 (*)    BUN 24 (*)    Creatinine, Ser 1.48 (*)    GFR, Estimated 48 (*)    All other components within normal limits  CBC - Abnormal; Notable for the following components:   WBC 11.6 (*)    Platelets 132 (*)    All other components within normal limits    EKG None  Radiology CT Renal Stone Study  Result Date: 10/22/2022 CLINICAL DATA:  Abdominal pain and flank pain EXAM: CT ABDOMEN AND PELVIS WITHOUT CONTRAST TECHNIQUE: Multidetector CT imaging of the abdomen and pelvis was performed following the standard protocol without IV contrast. RADIATION DOSE  REDUCTION: This exam was performed according to the departmental dose-optimization program which includes automated exposure control, adjustment of the mA and/or kV according to patient size and/or use of iterative reconstruction technique. COMPARISON:  CT urogram 03/29/2016 FINDINGS: Lower chest: No acute abnormality. Hepatobiliary: There are 2 rounded hypodensities in the liver measuring up to 11 mm favored as cysts, unchanged. Gallbladder and bile ducts are within normal limits. Pancreas: Unremarkable. No pancreatic ductal dilatation or surrounding inflammatory changes. Spleen: Normal in size without focal abnormality. Adrenals/Urinary Tract: There is a 5 mm calculus in the proximal left ureter with mild left-sided hydronephrosis. There are additional punctate nonobstructing right renal calculi. There is mild left perinephric fat stranding. The adrenal glands are within normal limits. A portion of the bladder is within a left inguinal hernia, unchanged. The bladder is otherwise within normal limits. Stomach/Bowel: Stomach is within normal limits. Appendix appears normal. No evidence of bowel wall thickening, distention, or inflammatory changes. There is diffuse colonic diverticulosis. Vascular/Lymphatic: Aortic atherosclerosis. No enlarged abdominal or pelvic lymph nodes. Reproductive: Prostate gland is enlarged. Other: There are fat containing inguinal hernias bilaterally, left greater than right. The left inguinal hernia contains a portion of the bladder similar to the prior study. There is also small fat containing umbilical hernia. There is no ascites. Musculoskeletal: Degenerative changes affect the spine IMPRESSION: 1. 5 mm calculus in the proximal left ureter with mild obstructive uropathy. 2. Additional nonobstructing right renal calculi. 3. Colonic diverticulosis. 4. Bilateral inguinal hernias, left greater than right. The left inguinal hernia contains a portion of the bladder, unchanged. 5.  Prostatomegaly. 6. Aortic atherosclerosis. Aortic Atherosclerosis (ICD10-I70.0). Electronically Signed   By: Darliss Cheney M.D.   On: 10/22/2022 21:40    Procedures Procedures  {Document cardiac monitor, telemetry assessment procedure when appropriate:1}  Medications Ordered in ED Medications  ketorolac (TORADOL) 30 MG/ML injection 30 mg (has no administration in time range)    ED Course/ Medical Decision Making/ A&P   {   Click here for ABCD2, HEART and other calculatorsREFRESH Note before signing :1}                              Medical Decision Making Amount and/or Complexity of Data Reviewed Labs: ordered. Radiology: ordered.  Risk Prescription drug management.   Patient has a left-sided kidney stone in the proximal ureter.  He is started on Flomax, Zofran and Percocet and will follow-up with urology  {Document critical care time when appropriate:1} {Document review of labs and clinical decision tools ie heart score, Chads2Vasc2 etc:1}  {Document your independent review of radiology images,  and any outside records:1} {Document your discussion with family members, caretakers, and with consultants:1} {Document social determinants of health affecting pt's care:1} {Document your decision making why or why not admission, treatments were needed:1} Final Clinical Impression(s) / ED Diagnoses Final diagnoses:  Kidney stone    Rx / DC Orders ED Discharge Orders          Ordered    oxyCODONE-acetaminophen (PERCOCET) 5-325 MG tablet  Every 6 hours PRN        10/22/22 2211    ondansetron (ZOFRAN-ODT) 4 MG disintegrating tablet        10/22/22 2211    tamsulosin (FLOMAX) 0.4 MG CAPS capsule  Daily        10/22/22 2211

## 2022-10-26 ENCOUNTER — Telehealth: Payer: Self-pay | Admitting: Family Medicine

## 2022-10-26 NOTE — Telephone Encounter (Signed)
Pt called to say he is in the process of passing a kidney stone and needs to go to [facility] today!! Pt states he is in a lot of pain and was told he needed and MD to give a verbal okay over the phone, for a referral to be seen today. Pt states he wants to be seen by 3 pm today.  Pt was informed MD is OOO on Thursdays. Pt stated he did not care who gave the ok, as long as they can call [facility] and get him in, today!!  Please call [phone] As soon as possible.

## 2022-10-26 NOTE — Telephone Encounter (Signed)
I spoke with David Carr with [facility] and was provided ok for verbal referral as requested. Patient has been scheduled for tomorrow at 9:45 at the [facility] [place] location.

## 2022-10-27 DIAGNOSIS — R110 Nausea: Secondary | ICD-10-CM | POA: Diagnosis not present

## 2022-10-27 DIAGNOSIS — R350 Frequency of micturition: Secondary | ICD-10-CM | POA: Diagnosis not present

## 2022-10-27 DIAGNOSIS — N400 Enlarged prostate without lower urinary tract symptoms: Secondary | ICD-10-CM | POA: Diagnosis not present

## 2022-10-27 DIAGNOSIS — N202 Calculus of kidney with calculus of ureter: Secondary | ICD-10-CM | POA: Diagnosis not present

## 2022-10-27 DIAGNOSIS — R351 Nocturia: Secondary | ICD-10-CM | POA: Diagnosis not present

## 2022-10-27 DIAGNOSIS — N200 Calculus of kidney: Secondary | ICD-10-CM | POA: Diagnosis not present

## 2022-10-31 DIAGNOSIS — N201 Calculus of ureter: Secondary | ICD-10-CM | POA: Diagnosis not present

## 2022-11-17 ENCOUNTER — Telehealth: Payer: Self-pay

## 2022-11-17 NOTE — Telephone Encounter (Signed)
Transition Care Management Unsuccessful Follow-up Telephone Call  Date of discharge and from where:  [facility] 9/8  Attempts:  1st Attempt  Reason for unsuccessful TCM follow-up call:  No answer/busy   Lenard Forth [place]  [employer], [facility] Guide, Phone: [phone] Website: [email or web address]

## 2022-11-18 ENCOUNTER — Other Ambulatory Visit: Payer: Self-pay | Admitting: Family Medicine

## 2022-11-18 DIAGNOSIS — I10 Essential (primary) hypertension: Secondary | ICD-10-CM

## 2022-11-20 ENCOUNTER — Telehealth: Payer: Self-pay

## 2022-11-20 NOTE — Telephone Encounter (Signed)
Transition Care Management Unsuccessful Follow-up Telephone Call  Date of discharge and from where:  [facility] 9/8  Attempts:  2nd Attempt  Reason for unsuccessful TCM follow-up call:  No answer/busy   David Carr David Carr  [facility], [facility] Guide, Phone: [phone] Website: [email or web address]

## 2023-01-10 DIAGNOSIS — E113512 Type 2 diabetes mellitus with proliferative diabetic retinopathy with macular edema, left eye: Secondary | ICD-10-CM | POA: Diagnosis not present

## 2023-01-18 DIAGNOSIS — H2513 Age-related nuclear cataract, bilateral: Secondary | ICD-10-CM | POA: Diagnosis not present

## 2023-01-18 DIAGNOSIS — H353112 Nonexudative age-related macular degeneration, right eye, intermediate dry stage: Secondary | ICD-10-CM | POA: Diagnosis not present

## 2023-01-18 DIAGNOSIS — H43813 Vitreous degeneration, bilateral: Secondary | ICD-10-CM | POA: Diagnosis not present

## 2023-01-18 DIAGNOSIS — H353221 Exudative age-related macular degeneration, left eye, with active choroidal neovascularization: Secondary | ICD-10-CM | POA: Diagnosis not present

## 2023-01-18 DIAGNOSIS — H31092 Other chorioretinal scars, left eye: Secondary | ICD-10-CM | POA: Diagnosis not present

## 2023-01-18 LAB — HM DIABETES EYE EXAM

## 2023-02-12 DIAGNOSIS — H00025 Hordeolum internum left lower eyelid: Secondary | ICD-10-CM | POA: Diagnosis not present

## 2023-02-27 ENCOUNTER — Telehealth: Payer: Medicare HMO | Admitting: Family Medicine

## 2023-04-16 ENCOUNTER — Other Ambulatory Visit: Payer: Self-pay | Admitting: Family Medicine

## 2023-04-17 ENCOUNTER — Other Ambulatory Visit: Payer: Self-pay | Admitting: Family Medicine

## 2023-04-18 ENCOUNTER — Other Ambulatory Visit: Payer: Self-pay

## 2023-04-18 MED ORDER — ONETOUCH VERIO W/DEVICE KIT: PACK | 0 refills | Status: DC

## 2023-04-18 MED ORDER — GLUCOSE BLOOD VI STRP: ORAL_STRIP | 1 refills | Status: AC

## 2023-05-07 ENCOUNTER — Telehealth: Payer: Self-pay | Admitting: Cardiology

## 2023-05-07 NOTE — Telephone Encounter (Signed)
 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 She states pt mentioned years ago, he had a hole his heart that was fixed by Dr. Excell Seltzer. She states she needs to know if pt had a atrial or ventricular septal defect. Please advise.

## 2023-05-17 NOTE — Telephone Encounter (Signed)
 Patient identification verified by 2 forms. Shade Flood, RN     Called and spoke to [facility]  Per Dr. Antoine Poche patient had PFO closure.  Joella verbalized understanding. No further questions at this time.

## 2023-06-04 DIAGNOSIS — H43813 Vitreous degeneration, bilateral: Secondary | ICD-10-CM | POA: Diagnosis not present

## 2023-06-04 DIAGNOSIS — H353221 Exudative age-related macular degeneration, left eye, with active choroidal neovascularization: Secondary | ICD-10-CM | POA: Diagnosis not present

## 2023-06-04 DIAGNOSIS — H2513 Age-related nuclear cataract, bilateral: Secondary | ICD-10-CM | POA: Diagnosis not present

## 2023-06-04 DIAGNOSIS — H43823 Vitreomacular adhesion, bilateral: Secondary | ICD-10-CM | POA: Diagnosis not present

## 2023-06-04 DIAGNOSIS — H353112 Nonexudative age-related macular degeneration, right eye, intermediate dry stage: Secondary | ICD-10-CM | POA: Diagnosis not present

## 2023-06-04 DIAGNOSIS — H0019 Chalazion unspecified eye, unspecified eyelid: Secondary | ICD-10-CM | POA: Diagnosis not present

## 2023-06-11 ENCOUNTER — Telehealth: Payer: Self-pay

## 2023-06-11 ENCOUNTER — Ambulatory Visit (INDEPENDENT_AMBULATORY_CARE_PROVIDER_SITE_OTHER): Payer: Medicare HMO | Admitting: Family Medicine

## 2023-06-11 ENCOUNTER — Encounter: Payer: Self-pay | Admitting: Family Medicine

## 2023-06-11 VITALS — BP 118/70 | HR 74 | Temp 98.3°F | Ht 67.32 in | Wt 188.2 lb

## 2023-06-11 DIAGNOSIS — H3530 Unspecified macular degeneration: Secondary | ICD-10-CM | POA: Diagnosis not present

## 2023-06-11 DIAGNOSIS — I10 Essential (primary) hypertension: Secondary | ICD-10-CM | POA: Diagnosis not present

## 2023-06-11 DIAGNOSIS — Z794 Long term (current) use of insulin: Secondary | ICD-10-CM | POA: Diagnosis not present

## 2023-06-11 DIAGNOSIS — Z0000 Encounter for general adult medical examination without abnormal findings: Secondary | ICD-10-CM

## 2023-06-11 DIAGNOSIS — E7800 Pure hypercholesterolemia, unspecified: Secondary | ICD-10-CM

## 2023-06-11 DIAGNOSIS — E11649 Type 2 diabetes mellitus with hypoglycemia without coma: Secondary | ICD-10-CM | POA: Diagnosis not present

## 2023-06-11 LAB — LIPID PANEL
Cholesterol: 128 mg/dL (ref 0–200)
HDL: 43.1 mg/dL (ref >39.00)
LDL Cholesterol: 71 mg/dL (ref 0–99)
NonHDL: 84.58
Total CHOL/HDL Ratio: 3
Triglycerides: 67 mg/dL (ref 0.0–149.0)
VLDL: 13.4 mg/dL (ref 0.0–40.0)

## 2023-06-11 LAB — CBC WITH DIFFERENTIAL/PLATELET
Basophils Absolute: 0 10*3/uL (ref 0.0–0.1)
Basophils Relative: 0.4 % (ref 0.0–3.0)
Eosinophils Absolute: 0 10*3/uL (ref 0.0–0.7)
Eosinophils Relative: 0.4 % (ref 0.0–5.0)
HCT: 48.8 % (ref 39.0–52.0)
Hemoglobin: 16.5 g/dL (ref 13.0–17.0)
Lymphocytes Relative: 20.7 % (ref 12.0–46.0)
Lymphs Abs: 1 10*3/uL (ref 0.7–4.0)
MCHC: 33.8 g/dL (ref 30.0–36.0)
MCV: 93.6 fl (ref 78.0–100.0)
Monocytes Absolute: 0.4 10*3/uL (ref 0.1–1.0)
Monocytes Relative: 8.3 % (ref 3.0–12.0)
Neutro Abs: 3.5 10*3/uL (ref 1.4–7.7)
Neutrophils Relative %: 70.2 % (ref 43.0–77.0)
Platelets: 140 10*3/uL — ABNORMAL LOW (ref 150.0–400.0)
RBC: 5.21 Mil/uL (ref 4.22–5.81)
RDW: 14.2 % (ref 11.5–15.5)
WBC: 5 10*3/uL (ref 4.0–10.5)

## 2023-06-11 LAB — COMPREHENSIVE METABOLIC PANEL WITH GFR
ALT: 11 U/L (ref 0–53)
AST: 15 U/L (ref 0–37)
Albumin: 4.1 g/dL (ref 3.5–5.2)
Alkaline Phosphatase: 60 U/L (ref 39–117)
BUN: 15 mg/dL (ref 6–23)
CO2: 31 meq/L (ref 19–32)
Calcium: 9.4 mg/dL (ref 8.4–10.5)
Chloride: 103 meq/L (ref 96–112)
Creatinine, Ser: 0.88 mg/dL (ref 0.40–1.50)
GFR: 81.61 mL/min (ref >60.00)
Glucose, Bld: 150 mg/dL — ABNORMAL HIGH (ref 70–99)
Potassium: 4.7 meq/L (ref 3.5–5.1)
Sodium: 141 meq/L (ref 135–145)
Total Bilirubin: 0.8 mg/dL (ref 0.2–1.2)
Total Protein: 6.4 g/dL (ref 6.0–8.3)

## 2023-06-11 LAB — MICROALBUMIN / CREATININE URINE RATIO
Creatinine,U: 139.7 mg/dL
Microalb Creat Ratio: 38.6 mg/g — ABNORMAL HIGH (ref 0.0–30.0)
Microalb, Ur: 5.4 mg/dL — ABNORMAL HIGH (ref 0.0–1.9)

## 2023-06-11 LAB — HEMOGLOBIN A1C: Hgb A1c MFr Bld: 6.9 % — ABNORMAL HIGH (ref 4.6–6.5)

## 2023-06-11 MED ORDER — SIMVASTATIN 20 MG PO TABS
ORAL_TABLET | ORAL | 1 refills | Status: DC
Start: 2023-06-11 — End: 2023-12-20

## 2023-06-11 MED ORDER — METFORMIN HCL ER 500 MG PO TB24: 500.0000 mg | ORAL_TABLET | Freq: Every day | ORAL | 3 refills | Status: AC

## 2023-06-11 MED ORDER — NEOMYCIN-POLYMYXIN-HC 3.5-10000-1 OT SOLN: 4.0000 [drp] | Freq: Four times a day (QID) | OTIC | 0 refills | Status: AC

## 2023-06-11 MED ORDER — BASAGLAR KWIKPEN 100 UNIT/ML ~~LOC~~ SOPN: 16.0000 [IU] | PEN_INJECTOR | Freq: Every day | SUBCUTANEOUS | 3 refills | Status: DC

## 2023-06-11 MED ORDER — GLIPIZIDE 5 MG PO TABS: ORAL_TABLET | ORAL | 3 refills | Status: AC

## 2023-06-11 MED ORDER — ATENOLOL 25 MG PO TABS: 25.0000 mg | ORAL_TABLET | Freq: Every day | ORAL | 3 refills | Status: AC

## 2023-06-11 NOTE — Telephone Encounter (Signed)
 Copied from CRM [id number]. Topic: General - Other >> Jun 11, 2023 11:49 AM Albertha Alosa wrote: Reason for CRM: Patient called in stating he had a message to leave for Dr.Burchette regarding their previus conversation of the prs vaccine, patient stated he had the prs vaccine got the true vaccine in October 2022 , wanted to relay that to Dr.Burchette

## 2023-06-11 NOTE — Progress Notes (Signed)
 Established Patient Office Visit  Subjective   Patient ID: David Carr., male    DOB: 1945/10/17  Age: 78 y.o. MRN: [id number]  Chief Complaint  Patient presents with   Annual Exam    HPI   Mr. David Carr is here for physical exam.  He has history of CAD, hypertension, type 2 diabetes, history of AML in remission, hyperlipidemia.  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  Vision has been improving with treatment.  Overall doing well.  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  No recent chest pains.  Not monitoring blood sugars regularly.  Last A1c 7.0%.  Health maintenance reviewed:  Health Maintenance  Topic Date Due   Medicare Annual Wellness (AWV)  Never done   FOOT EXAM  09/07/2021   HEMOGLOBIN A1C  10/25/2022   Diabetic kidney evaluation - Urine ACR  04/24/2023   COVID-19 Vaccine (7 - Pfizer risk 2024-25 season) 05/31/2023   INFLUENZA VACCINE  09/14/2023   Diabetic kidney evaluation - eGFR measurement  10/22/2023   OPHTHALMOLOGY EXAM  01/18/2024   DTaP/Tdap/Td (4 - Td or Tdap) 06/10/2029   Pneumonia Vaccine 42+ Years old  Completed   Zoster Vaccines- Shingrix  Completed   HPV VACCINES  Aged Out   Meningococcal B Vaccine  Aged Out   Colonoscopy  Discontinued   Hepatitis C Screening  Discontinued   Family history and social history reviewed  Family History  Problem Relation Age of Onset   Heart attack Father    Alcoholism Father    Obesity Father    Asthma Sister    Cancer Daughter        carcinoid tumor   Social History   Socioeconomic History   Marital status: Married    Spouse name: Not on file   Number of children: 2   Years of education: college grad   Highest education level: Not on file  Occupational History    Comment: retired, NA  Tobacco Use   Smoking status: Former    Current packs/day: 0.00    Average packs/day: 1.5 packs/day for 20.0 years (30.0 ttl pk-yrs)     Types: Cigarettes    Start date: 04/18/1954    Quit date: 04/18/1974    Years since quitting: 49.1   Smokeless tobacco: Never  Vaping Use   Vaping status: Never Used  Substance and Sexual Activity   Alcohol use: No   Drug use: No   Sexual activity: Not Currently  Other Topics Concern   Not on file  Social History Narrative   Lives wife,  Son   Coffee, 1 cup  daily   Social Drivers of Health   Financial Resource Strain: Not on file  Food Insecurity: Not on file  Transportation Needs: Not on file  Physical Activity: Not on file  Stress: Not on file  Social Connections: Unknown (12/18/2022)   Received from [facility]   Social Network    Social Network: Not on file  Intimate Partner Violence: Unknown (12/18/2022)   Received from [facility]   HITS    Physically Hurt: Not on file    Insult or Talk Down To: Not on file    Threaten Physical Harm: Not on file    Scream or Curse: Not on file    Review of Systems  Constitutional:  Negative for chills, fever, malaise/fatigue and weight loss.  HENT:  Negative for hearing loss.  Eyes:  Negative for blurred vision and double vision.  Respiratory:  Negative for cough and shortness of breath.   Cardiovascular:  Negative for chest pain, palpitations and leg swelling.  Gastrointestinal:  Negative for abdominal pain, blood in stool, constipation and diarrhea.  Genitourinary:  Negative for dysuria.  Skin:  Negative for rash.  Neurological:  Negative for dizziness, speech change, seizures, loss of consciousness and headaches.  Psychiatric/Behavioral:  Negative for depression.       Objective:     BP 118/70 (BP Location: Left Arm, Patient Position: Sitting, Cuff Size: Normal)   Pulse 74   Temp 98.3 F (36.8 C) (Oral)   Ht 5' 7.32" (1.71 m)   Wt 188 lb 3.2 oz (85.4 kg)   SpO2 95%   BMI 29.19 kg/m  BP Readings from Last 3 Encounters:  06/11/23 118/70  10/22/22 127/79  04/17/22 118/70   Wt Readings from Last 3 Encounters:   06/11/23 188 lb 3.2 oz (85.4 kg)  10/22/22 188 lb (85.3 kg)  04/17/22 189 lb 12.8 oz (86.1 kg)      Physical Exam Vitals reviewed.  Constitutional:      General: He is not in acute distress.    Appearance: He is well-developed. He is not ill-appearing.  HENT:     Head: Normocephalic and atraumatic.     Right Ear: External ear normal.     Left Ear: External ear normal.  Eyes:     Conjunctiva/sclera: Conjunctivae normal.     Pupils: Pupils are equal, round, and reactive to light.  Neck:     Thyroid : No thyromegaly.  Cardiovascular:     Rate and Rhythm: Normal rate and regular rhythm.     Heart sounds: Normal heart sounds. No murmur heard. Pulmonary:     Effort: No respiratory distress.     Breath sounds: No wheezing or rales.  Abdominal:     General: Bowel sounds are normal. There is no distension.     Palpations: Abdomen is soft.     Tenderness: There is no abdominal tenderness. There is no guarding or rebound.     Comments: Umbilical hernia which is soft and nontender  Musculoskeletal:     Cervical back: Normal range of motion and neck supple.     Comments: Trace pitting edema lower legs bilaterally  Lymphadenopathy:     Cervical: No cervical adenopathy.  Skin:    Findings: No rash.  Neurological:     Mental Status: He is alert and oriented to person, place, and time.     Cranial Nerves: No cranial nerve deficit.     Deep Tendon Reflexes: Reflexes normal.      No results found for any visits on 06/11/23.  Last CBC Lab Results  Component Value Date   WBC 11.6 (H) 10/22/2022   HGB 16.4 10/22/2022   HCT 47.0 10/22/2022   MCV 93.4 10/22/2022   MCH 32.6 10/22/2022   RDW 13.0 10/22/2022   PLT 132 (L) 10/22/2022   Last metabolic panel Lab Results  Component Value Date   GLUCOSE 134 (H) 10/22/2022   NA 136 10/22/2022   K 4.1 10/22/2022   CL 103 10/22/2022   CO2 23 10/22/2022   BUN 24 (H) 10/22/2022   CREATININE 1.48 (H) 10/22/2022   GFRNONAA 48 (L)  10/22/2022   CALCIUM 9.4 10/22/2022   PROT 6.9 04/24/2022   ALBUMIN  4.1 04/24/2022   BILITOT 0.9 04/24/2022   ALKPHOS 59 04/24/2022   AST 29 04/24/2022  ALT 30 04/24/2022   ANIONGAP 10 10/22/2022   Last lipids Lab Results  Component Value Date   CHOL 143 04/24/2022   HDL 51.40 04/24/2022   LDLCALC 73 04/24/2022   LDLDIRECT 74.4 06/19/2006   TRIG 89.0 04/24/2022   CHOLHDL 3 04/24/2022   Last hemoglobin A1c Lab Results  Component Value Date   HGBA1C 7.0 (H) 04/24/2022      The 10-year ASCVD risk score (Arnett DK, et al., 2019) is: 49%    Assessment & Plan:   Problem List Items Addressed This Visit       Unprioritized   Macular degeneration of left eye - Primary   Type 2 diabetes mellitus with hypoglycemia without coma, with long-term current use of insulin  (HCC)   Relevant Medications   glipiZIDE  (GLUCOTROL ) 5 MG tablet   Insulin  Glargine (BASAGLAR  KWIKPEN) 100 UNIT/ML   simvastatin  (ZOCOR ) 20 MG tablet   metFORMIN  (GLUCOPHAGE -XR) 500 MG 24 hr tablet   Other Relevant Orders   CBC with Differential/Platelet   Hemoglobin A1c   Microalbumin / creatinine urine ratio   Hyperlipidemia   Relevant Medications   atenolol  (TENORMIN ) 25 MG tablet   simvastatin  (ZOCOR ) 20 MG tablet   Other Relevant Orders   Lipid panel   CMP   Essential hypertension   Relevant Medications   atenolol  (TENORMIN ) 25 MG tablet   simvastatin  (ZOCOR ) 20 MG tablet  78 year old male here for physical exam.  We reviewed several health maintenance issues and discussed the following  -Continue annual flu vaccine - Consider RSV vaccine.  He thinks he was part of initial study but is not sure if he got the actual vaccine. - Tetanus up-to-date - Aged out of further colonoscopies  -Obtain multiple labs as above.  Include A1c and urine microalbumin with his diabetes history - Continue regular eye exams  No follow-ups on file.    Glean Lamy, MD

## 2023-06-11 NOTE — Telephone Encounter (Signed)
Vaccine information added to chart.

## 2023-06-11 NOTE — Telephone Encounter (Signed)
 Copied from CRM [id number]. Topic: General - Other >> Jun 11, 2023  2:05 PM Shardie S wrote: Reason for CRM: Patient calling to request a copy of his visit notes/ results from 04/28. He request to have the information mailed to him. Patient also request a callback from provider.

## 2023-06-11 NOTE — Telephone Encounter (Signed)
 Results mailed and please see result note for additional information

## 2023-06-13 NOTE — Addendum Note (Signed)
 Addended by: Aurelio Leer on: 06/13/2023 01:39 PM   Modules accepted: Orders

## 2023-08-07 DIAGNOSIS — T63451A Toxic effect of venom of hornets, accidental (unintentional), initial encounter: Secondary | ICD-10-CM | POA: Diagnosis not present

## 2023-08-09 ENCOUNTER — Ambulatory Visit (INDEPENDENT_AMBULATORY_CARE_PROVIDER_SITE_OTHER): Admitting: Family Medicine

## 2023-08-09 ENCOUNTER — Encounter: Payer: Self-pay | Admitting: Family Medicine

## 2023-08-09 ENCOUNTER — Ambulatory Visit: Payer: Self-pay

## 2023-08-09 VITALS — BP 112/80 | HR 70 | Temp 98.5°F | Ht 67.0 in | Wt 186.0 lb

## 2023-08-09 DIAGNOSIS — T63481D Toxic effect of venom of other arthropod, accidental (unintentional), subsequent encounter: Secondary | ICD-10-CM | POA: Diagnosis not present

## 2023-08-09 NOTE — Progress Notes (Signed)
      Acute Office Visit  Subjective:     Patient ID: David Carr., male    DOB: 02/12/1946, 78 y.o.   MRN: [id number]  Chief Complaint  Patient presents with   Insect Bite    Patient states he was stung by bees 12 days ago onto the right forearm, left thigh and lower extremities while mowing grass, initially tried OTC creams, itching returned 2 days ago and was seen by [facility] (see office notes), given Kenalog  injection in office-self discontinued Rxs given for Famotidine  and Triamcinolone  cream-using OTC Lidocaine  ointment    Pt reports he was mowing the lawn about a week ago and he accidentally got into a yellow jacket nest. States that he was stung about 15 + times, states he went to the store and took benadryl  and hydrocortisone cream. States htat he felt better and so he stopped using the medication. States the next day he started itching more so he went to urgent care. States he was given a shot of triamcinolone  and was also given pepcid  20 mg BID and triamcinolone  cream. States that he didn't feel that this medication was working for him so he went back to taking his benadryl .  States he has also been using lidocaine  cream.   Review of Systems  All other systems reviewed and are negative.       Objective:    BP 112/80   Pulse 70   Temp 98.5 F (36.9 C) (Oral)   Ht 5' 7 (1.702 m)   Wt 186 lb (84.4 kg)   SpO2 98%   BMI 29.13 kg/m    Physical Exam Vitals reviewed.  Skin: see photos  No results found for any visits on 08/09/23.      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 These were written  down for him.   No orders of the defined types were placed in this encounter.   No follow-ups on file.  Heron CHRISTELLA Sharper, MD

## 2023-08-09 NOTE — Patient Instructions (Signed)
 Zyrtec 10 mg every morning  50 mg of benadryl  at night

## 2023-08-09 NOTE — Telephone Encounter (Signed)
 FYI Only or Action Required?: FYI only for provider.  Patient was last seen in primary care on 06/11/2023 by Micheal Wolm ORN, MD. Called Nurse Triage reporting Insect Bite. Symptoms began a week ago. Interventions attempted: Other: OTC and has been seen at [facility]. Symptoms are: itching unchanged.  Triage Disposition: See HCP Within 4 Hours (Or PCP Triage)  Patient/caregiver understands and will follow disposition?: Yes                 Copied from CRM [id number]. Topic: Clinical - Red Word Triage >> Aug 09, 2023  9:56 AM [place] A wrote: [facility] that prompted transfer to Nurse Triage: Patient was stung by Yellow Jackets-has stinging and severe itching Reason for Disposition  More than 50 stings  Answer Assessment - Initial Assessment Questions 1. TYPE: What type of sting was it? (bee, yellow jacket, etc.)      Yellow jackets -  2. ONSET: When did it occur?      Last Tuesday 3. LOCATION: Where is the sting located?  How many stings?     Both arms and legs 4. SWELLING SIZE: How big is the swelling? (e.g., inches or cm)     no 5. REDNESS: Is the area red or pink? If Yes, ask: What size is area of redness? (e.g., inches or cm). When did the redness start?     no 6. PAIN: Is there any pain? If Yes, ask: How bad is it?  (Scale 1-10; or mild, moderate, severe)     no 7. ITCHING: Is there any itching? If Yes, ask: How bad is it?      Very itchy 8. RESPIRATORY DISTRESS: Describe your breathing.     No issue 9. PRIOR REACTIONS: Have you had any severe allergic reactions to stings in the past? if yes, ask: What happened?     yes 10. OTHER SYMPTOMS: Do you have any other symptoms? (e.g., abdomen pain, face or tongue swelling, new rash elsewhere, vomiting)       no  Protocols used: Bee or Yellow Jacket Sting-A-AH

## 2023-09-03 DIAGNOSIS — H43823 Vitreomacular adhesion, bilateral: Secondary | ICD-10-CM | POA: Diagnosis not present

## 2023-09-03 DIAGNOSIS — H2513 Age-related nuclear cataract, bilateral: Secondary | ICD-10-CM | POA: Diagnosis not present

## 2023-09-03 DIAGNOSIS — E113211 Type 2 diabetes mellitus with mild nonproliferative diabetic retinopathy with macular edema, right eye: Secondary | ICD-10-CM | POA: Diagnosis not present

## 2023-09-03 DIAGNOSIS — H353112 Nonexudative age-related macular degeneration, right eye, intermediate dry stage: Secondary | ICD-10-CM | POA: Diagnosis not present

## 2023-09-03 DIAGNOSIS — H353221 Exudative age-related macular degeneration, left eye, with active choroidal neovascularization: Secondary | ICD-10-CM | POA: Diagnosis not present

## 2023-09-03 DIAGNOSIS — H0015 Chalazion left lower eyelid: Secondary | ICD-10-CM | POA: Diagnosis not present

## 2023-09-03 DIAGNOSIS — H43813 Vitreous degeneration, bilateral: Secondary | ICD-10-CM | POA: Diagnosis not present

## 2023-09-20 ENCOUNTER — Other Ambulatory Visit: Payer: Self-pay | Admitting: Family Medicine

## 2023-09-21 ENCOUNTER — Telehealth: Payer: Self-pay

## 2023-09-21 ENCOUNTER — Other Ambulatory Visit: Payer: Self-pay | Admitting: Family Medicine

## 2023-09-21 NOTE — Telephone Encounter (Signed)
 Rx done.

## 2023-09-21 NOTE — Telephone Encounter (Signed)
 Copied from CRM [id number]. Topic: Clinical - Prescription Issue >> Sep 21, 2023  9:23 AM Chasity T wrote: Reason for CRM: [facility] from [facility] is calling in to request the usage of the medication BD PEN NEEDLE NANO 2ND GEN 32G X 4 MM to be updated and send back to the pharmacy. She states that patient only uses it once a day and not 4.

## 2023-10-05 ENCOUNTER — Other Ambulatory Visit: Payer: Self-pay | Admitting: Family Medicine

## 2023-10-05 DIAGNOSIS — J3489 Other specified disorders of nose and nasal sinuses: Secondary | ICD-10-CM

## 2023-10-05 MED ORDER — IPRATROPIUM BROMIDE 0.06 % NA SOLN: 2.0000 | Freq: Four times a day (QID) | NASAL | 4 refills | Status: AC

## 2023-10-05 NOTE — Telephone Encounter (Signed)
 Copied from CRM [id number]. Topic: Clinical - Medication Refill >> Oct 05, 2023  9:36 AM [place] M wrote: Medication: ipratropium (ATROVENT ) 0.06 % nasal spray  Has the patient contacted their pharmacy? Yes (Agent: If no, request that the patient contact the pharmacy for the refill. If patient does not wish to contact the pharmacy document the reason why and proceed with request.) (Agent: If yes, when and what did the pharmacy advise?)  This is the patient's preferred pharmacy:  [facility] [address], [place] - [address] [place] [address] Phone: [phone] Fax: [phone]  Is this the correct pharmacy for this prescription? Yes If no, delete pharmacy and type the correct one.   Has the prescription been filled recently? Yes  Is the patient out of the medication? Yes  Has the patient been seen for an appointment in the last year OR does the patient have an upcoming appointment? Yes  Can we respond through MyChart? Yes  Agent: Please be advised that Rx refills may take up to 3 business days. We ask that you follow-up with your pharmacy.

## 2023-11-27 ENCOUNTER — Ambulatory Visit

## 2023-11-27 DIAGNOSIS — Z23 Encounter for immunization: Secondary | ICD-10-CM

## 2023-12-20 ENCOUNTER — Other Ambulatory Visit: Payer: Self-pay | Admitting: Family Medicine

## 2023-12-20 DIAGNOSIS — E7800 Pure hypercholesterolemia, unspecified: Secondary | ICD-10-CM

## 2023-12-31 ENCOUNTER — Other Ambulatory Visit: Payer: Self-pay | Admitting: Family Medicine

## 2024-01-01 NOTE — Telephone Encounter (Signed)
 Patient ok with change and rx sent

## 2024-01-14 ENCOUNTER — Telehealth: Payer: Self-pay

## 2024-01-14 NOTE — Telephone Encounter (Signed)
 Copied from CRM [id number]. Topic: Clinical - Prescription Issue >> Jan 14, 2024 10:04 AM Nessti S wrote: Reason for CRM: pt called in because [employer] is no longer covering the Blood Glucose Monitoring Suppl (ONETOUCH VERIO) w/Device KIT and would need a new prescription for roche accu-chek guide, test strips and lancets. Call back number [phone]   ----------------------------------------------------------------------- From previous Reason for Contact - Medical Advice: Reason for CRM:

## 2024-01-15 ENCOUNTER — Telehealth: Payer: Self-pay | Admitting: *Deleted

## 2024-01-15 NOTE — Telephone Encounter (Signed)
 Copied from CRM [id number]. Topic: Clinical - Prescription Issue >> Jan 14, 2024 10:04 AM Nessti S wrote: Reason for CRM: pt called in because [employer] is no longer covering the Blood Glucose Monitoring Suppl (ONETOUCH VERIO) w/Device KIT and would need a new prescription for roche accu-chek guide, test strips and lancets. Call back number [phone]   ----------------------------------------------------------------------- From previous Reason for Contact - Medical Advice: Reason for CRM: >> Jan 15, 2024  3:27 PM Larissa S wrote: Patient calling back to check the status for prescription for roche accu-chek guide, test strips and lancets. Requesting a callback once complete

## 2024-01-16 ENCOUNTER — Other Ambulatory Visit: Payer: Self-pay | Admitting: Family Medicine

## 2024-01-16 MED ORDER — ACCU-CHEK SOFTCLIX LANCETS MISC: 1 refills | Status: AC

## 2024-01-16 MED ORDER — ACCU-CHEK GUIDE ME W/DEVICE KIT: PACK | 0 refills | Status: AC

## 2024-01-16 MED ORDER — ACCU-CHEK GUIDE TEST VI STRP: ORAL_STRIP | 1 refills | Status: DC

## 2024-01-16 NOTE — Telephone Encounter (Signed)
 Rx sent.
